# Patient Record
Sex: Female | Born: 1950
Health system: Southern US, Community
[De-identification: ages and names within clinical notes are randomized; demographics above are authoritative.]

## PROBLEM LIST (undated history)

## (undated) DIAGNOSIS — G4730 Sleep apnea, unspecified: Secondary | ICD-10-CM

## (undated) DIAGNOSIS — I10 Essential (primary) hypertension: Secondary | ICD-10-CM

## (undated) DIAGNOSIS — F329 Major depressive disorder, single episode, unspecified: Secondary | ICD-10-CM

## (undated) DIAGNOSIS — Z8719 Personal history of other diseases of the digestive system: Secondary | ICD-10-CM

## (undated) DIAGNOSIS — F32A Depression, unspecified: Secondary | ICD-10-CM

## (undated) DIAGNOSIS — J302 Other seasonal allergic rhinitis: Secondary | ICD-10-CM

## (undated) DIAGNOSIS — K219 Gastro-esophageal reflux disease without esophagitis: Secondary | ICD-10-CM

## (undated) DIAGNOSIS — M722 Plantar fascial fibromatosis: Secondary | ICD-10-CM

## (undated) DIAGNOSIS — L02416 Cutaneous abscess of left lower limb: Secondary | ICD-10-CM

## (undated) DIAGNOSIS — I501 Left ventricular failure: Secondary | ICD-10-CM

## (undated) DIAGNOSIS — M1990 Unspecified osteoarthritis, unspecified site: Secondary | ICD-10-CM

## (undated) DIAGNOSIS — F319 Bipolar disorder, unspecified: Secondary | ICD-10-CM

## (undated) DIAGNOSIS — I509 Heart failure, unspecified: Secondary | ICD-10-CM

## (undated) DIAGNOSIS — M329 Systemic lupus erythematosus, unspecified: Secondary | ICD-10-CM

## (undated) DIAGNOSIS — E669 Obesity, unspecified: Secondary | ICD-10-CM

## (undated) DIAGNOSIS — E039 Hypothyroidism, unspecified: Secondary | ICD-10-CM

## (undated) DIAGNOSIS — E785 Hyperlipidemia, unspecified: Secondary | ICD-10-CM

## (undated) DIAGNOSIS — R0600 Dyspnea, unspecified: Secondary | ICD-10-CM

## (undated) DIAGNOSIS — M48061 Spinal stenosis, lumbar region without neurogenic claudication: Secondary | ICD-10-CM

## (undated) DIAGNOSIS — R519 Headache, unspecified: Secondary | ICD-10-CM

## (undated) DIAGNOSIS — R51 Headache: Secondary | ICD-10-CM

## (undated) DIAGNOSIS — E079 Disorder of thyroid, unspecified: Secondary | ICD-10-CM

## (undated) HISTORY — PX: TONSILLECTOMY: SUR1361

## (undated) HISTORY — DX: Sleep apnea, unspecified: G47.30

## (undated) HISTORY — PX: WISDOM TOOTH EXTRACTION: SHX21

## (undated) HISTORY — DX: Heart failure, unspecified: I50.9

## (undated) HISTORY — DX: Left ventricular failure, unspecified: I50.1

## (undated) HISTORY — PX: COLONOSCOPY: SHX174

## (undated) HISTORY — PX: LEG SURGERY: SHX1003

## (undated) HISTORY — PX: OTHER SURGICAL HISTORY: SHX169

## (undated) HISTORY — PX: FOOT SURGERY: SHX648

## (undated) HISTORY — PX: EYE SURGERY: SHX253

## (undated) HISTORY — PX: FRACTURE SURGERY: SHX138

## (undated) HISTORY — DX: Obesity, unspecified: E66.9

## (undated) HISTORY — PX: DILATION AND CURETTAGE OF UTERUS: SHX78

---
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
## 2004-04-06 ENCOUNTER — Other Ambulatory Visit: Admission: RE | Admit: 2004-04-06 | Discharge: 2004-04-06 | Payer: Self-pay | Admitting: Obstetrics and Gynecology

## 2004-05-07 ENCOUNTER — Encounter: Admission: RE | Admit: 2004-05-07 | Discharge: 2004-05-07 | Payer: Self-pay | Admitting: Obstetrics and Gynecology

## 2005-04-07 ENCOUNTER — Other Ambulatory Visit: Admission: RE | Admit: 2005-04-07 | Discharge: 2005-04-07 | Payer: Self-pay | Admitting: Obstetrics and Gynecology

## 2005-05-03 HISTORY — PX: CATARACT EXTRACTION: SUR2

## 2005-05-10 ENCOUNTER — Encounter: Admission: RE | Admit: 2005-05-10 | Discharge: 2005-05-10 | Payer: Self-pay | Admitting: Obstetrics and Gynecology

## 2005-09-04 ENCOUNTER — Inpatient Hospital Stay (HOSPITAL_COMMUNITY): Admission: EM | Admit: 2005-09-04 | Discharge: 2005-09-13 | Payer: Self-pay | Admitting: *Deleted

## 2005-09-13 ENCOUNTER — Inpatient Hospital Stay (HOSPITAL_COMMUNITY): Admission: RE | Admit: 2005-09-13 | Discharge: 2005-09-18 | Payer: Self-pay | Admitting: Psychiatry

## 2005-09-14 ENCOUNTER — Ambulatory Visit: Payer: Self-pay | Admitting: Psychiatry

## 2005-09-22 ENCOUNTER — Ambulatory Visit: Payer: Self-pay | Admitting: Psychiatry

## 2005-09-22 ENCOUNTER — Other Ambulatory Visit (HOSPITAL_COMMUNITY): Admission: RE | Admit: 2005-09-22 | Discharge: 2005-10-22 | Payer: Self-pay | Admitting: Psychiatry

## 2006-05-06 ENCOUNTER — Other Ambulatory Visit: Admission: RE | Admit: 2006-05-06 | Discharge: 2006-05-06 | Payer: Self-pay | Admitting: Obstetrics and Gynecology

## 2006-05-24 ENCOUNTER — Encounter: Admission: RE | Admit: 2006-05-24 | Discharge: 2006-05-24 | Payer: Self-pay | Admitting: Obstetrics and Gynecology

## 2006-11-29 ENCOUNTER — Encounter (INDEPENDENT_AMBULATORY_CARE_PROVIDER_SITE_OTHER): Payer: Self-pay | Admitting: Obstetrics and Gynecology

## 2006-11-29 ENCOUNTER — Ambulatory Visit (HOSPITAL_COMMUNITY): Admission: RE | Admit: 2006-11-29 | Discharge: 2006-11-29 | Payer: Self-pay | Admitting: Obstetrics and Gynecology

## 2007-07-10 ENCOUNTER — Other Ambulatory Visit: Admission: RE | Admit: 2007-07-10 | Discharge: 2007-07-10 | Payer: Self-pay | Admitting: Obstetrics and Gynecology

## 2007-07-10 ENCOUNTER — Encounter: Admission: RE | Admit: 2007-07-10 | Discharge: 2007-07-10 | Payer: Self-pay | Admitting: Obstetrics and Gynecology

## 2008-08-19 ENCOUNTER — Encounter: Admission: RE | Admit: 2008-08-19 | Discharge: 2008-08-19 | Payer: Self-pay | Admitting: Obstetrics and Gynecology

## 2010-05-23 ENCOUNTER — Encounter: Payer: Self-pay | Admitting: Obstetrics and Gynecology

## 2010-05-24 ENCOUNTER — Encounter: Payer: Self-pay | Admitting: Gastroenterology

## 2010-09-01 ENCOUNTER — Other Ambulatory Visit: Payer: Self-pay | Admitting: Family Medicine

## 2010-09-01 DIAGNOSIS — Z1231 Encounter for screening mammogram for malignant neoplasm of breast: Secondary | ICD-10-CM

## 2010-09-15 NOTE — H&P (Signed)
Jessica [name], Jessica [name]              ACCOUNT NO.:  [id number]   MEDICAL RECORD NO.:  [id number]           PATIENT TYPE:   LOCATION:                                 FACILITY:   PHYSICIAN:  Charles A. Delcambre, MDDATE OF BIRTH:  [birth year]   DATE OF ADMISSION:  DATE OF DISCHARGE:                              HISTORY & PHYSICAL   HISTORY:  The patient is a 60 year old gravida 2, para 2, 0, 0, 2,  menopausal now for about 4-1/2 years on hormone replacement therapy with  Prempro 0.625 mg.  She had some bleeding and we switched her to Premarin  for seven days and then back to the Prempro and this did not help with  her bleeding.  She had a cervical polyp removed about two months ago and  this showed an inflamed endocervical polyp.  An endometrial biopsy was  done at that time and was negative.  She continues bleeding, however.  An ultrasound was done, showing endometrial thickness of 0.4 cm and  irregular, felt to be thickened with possible polyp.  Therefore she is  to be admitted now for a hysteroscopy and D&C.  She gives an informed  consent and accepts the risks of infection, bleeding, uterine  perforation and bowel and bladder damage.  All questions are answered.  She gives an informed consent.   PAST MEDICAL HISTORY:  1. Bipolar disorder.  2. Hypothyroidism.  3. Arthritis.  4. Insomnia.  5. Depression.  6. Menopause.   PAST SURGICAL HISTORY:  1. Tonsillectomy.  2. Ganglion cyst removal.  3. Carpal tunnel surgery.  4. Leg fracture.   MEDICATIONS:  1. Lithium 600 mg b.i.d.  2. A sleeping pill, dose not specified.  3. A blood pressure pill, dose not specified.  4. Detrol 4 mg once daily  5. Synthroid, dose not specified.  She is instructed to bring these to      the hospital.   ALLERGIES:  Sensitive to [facility], which cause diarrhea, but not allergic  symptoms.   SOCIAL HISTORY:  One glass of wine rarely.  No tobacco, ethanol or drug  use.  The patient is married in a  monogamous relationship with her  husband.   FAMILY HISTORY:  Father deceased at age 47, of natural causes.  Mother  deceased at age 62, died of breast cancer.  No siblings.  Otherwise no  family history of uterus, ovary, cervix or colon cancer.  No lymphoma,  coronary artery disease, stroke, diabetes or hypertension.   REVIEW OF SYSTEMS:  Denies fever, chills, rashes, lesions, headaches,  dizziness, seasonable allergies, chest pain, shortness of breath,  wheezing, diarrhea, constipation, bleeding, urgency, frequency, dysuria,  incontinence, galactorrhea or emotional changes.   PHYSICAL EXAMINATION:  GENERAL:  Alert and oriented x3, in no distress.  VITAL SIGNS:  Weight 206 pounds, heart rate 74, respirations 18, blood  pressure 134/76.  HEENT:  Grossly within normal limits.  NECK:  Supple without thyromegaly or adenopathy.  LUNGS:  Clear bilaterally.  HEART:  A regular rate and rhythm without murmur.  BREASTS:  No masses, tenderness or discharge, no skin or  nipple change  bilaterally.  ABDOMEN:  Moderately obese, no hepatosplenomegaly palpable.  Exam is  nontender.  PELVIC:  Normal external female genitalia.  Bartholin, urethral and  Skene's within normal limits.  Vault without discharge or lesions.  Multiparous cervix is noted.  Uterus is not enlarged.  Adnexa nontender  without masses bilaterally.  Ovaries nonpalpable bilaterally.  RECTAL:  Examination was not done.  Anus and perineum appear normal.   ASSESSMENT:  Post-menopausal bleeding.   PLAN:  Hysteroscopy and D&C.  An informed consent given as noted above.  We will get a CBC and CMET prior to surgery.  An electrocardiogram and  chest x-ray per anesthesia.  She will remain n.p.o. past midnight.  All  questions were answered.  Will proceed as outlined.      Charles A. Sydnee Cabal, MD  Electronically Signed     CAD/MEDQ  D:  11/22/2006  T:  11/22/2006  Job:  [id number]

## 2010-09-15 NOTE — Op Note (Signed)
Jessica [name], LAPAGE             ACCOUNT NO.:  [id number]   MEDICAL RECORD NO.:  [id number]          PATIENT TYPE:  AMB   LOCATION:  SDC                           FACILITY:  [facility]   PHYSICIAN:  Charles A. Delcambre, MDDATE OF BIRTH:  Jun 11, 1950   DATE OF PROCEDURE:  11/29/2006  DATE OF DISCHARGE:                               OPERATIVE REPORT   PREOPERATIVE DIAGNOSIS:  1. Postmenopausal bleeding.  2. Endometrial polyp.   POSTOPERATIVE DIAGNOSIS:  1. Postmenopausal bleeding.  2. Endometrial polyp.   PROCEDURE:  1. Hysteroscopy.  2. Dilation curettage.  3. Polypectomy.  4. Paracervical block.   SURGEON:  Charles A. Sydnee Cabal, M.D.   ASSISTANT:  None.   COMPLICATIONS:  None.   SORBITOL LOSS:  25 mL.   ANESTHESIA:  Monitored anesthesia with IV sedation.   FINDINGS:  Posterior small endometrial polyp, otherwise atrophic  appearing endometrium.   SPECIMEN:  1. Polyps segment to pathology.  2. Endometrial curettings with polyp base likely to pathology.   COMPLICATIONS:  None.   ESTIMATED BLOOD LOSS:  Less than 25 mL.  Sponge and needle count correct  x2.   DESCRIPTION OF PROCEDURE:  The patient was taken to the operating room,  placed in supine position. IV sedation was accomplished  and was then  placed in dorsal lithotomy position in universal stirrups.  A sterile  prep and drape was undertaken.  A bivalve speculum was placed.  Tenaculums placed on the anterior cervix.  Weighted speculum was then  placed the vagina with cervix visualized adequately once it was pulled  down. A paracervical block of 20 mL quarter percent plain Marcaine was  injected at 4 and 8 o'clock without intravascular location of injection  noted.  Hanks dilators were used to dilate enough to get the small  hysteroscope in and hysteroscopic findings were noted above.  Hysteroscope was removed.  Polyp forceps were used to remove the polyp.  The small banjo curette was then used to  circumferentially curette the  entire cavity.  There was no evidence of perforation. Small amount of  tissue was obtained with curettings, tenaculum was removed.  There was  small amount of bleeding from the left tenaculum site and pressure was  held, silver nitrate was used, and was still oozing but I felt this was  within normal limits and then she was wakened taken to recovery with  physician in attendance.      Charles A. Sydnee Cabal, MD  Electronically Signed     CAD/MEDQ  D:  11/29/2006  T:  11/30/2006  Job:  [id number]

## 2010-09-16 ENCOUNTER — Ambulatory Visit
Admission: RE | Admit: 2010-09-16 | Discharge: 2010-09-16 | Disposition: A | Discharge status: Home / Self Care | Payer: Medicare Other | Source: Ambulatory Visit | Attending: Family Medicine | Admitting: Family Medicine

## 2010-09-16 DIAGNOSIS — Z1231 Encounter for screening mammogram for malignant neoplasm of breast: Secondary | ICD-10-CM

## 2010-09-18 NOTE — Discharge Summary (Signed)
NAMELEONIE, AMACHER             ACCOUNT NO.:  [id number]   MEDICAL RECORD NO.:  [id number]          PATIENT TYPE:  INP   LOCATION:  3713                         FACILITY:  [facility]   PHYSICIAN:  Kela Millin, M.D.DATE OF BIRTH:  [birth year]   DATE OF ADMISSION:  09/04/2005  DATE OF DISCHARGE:                                 DISCHARGE SUMMARY   ADDENDUM TO DISCHARGE SUMMARY:   HOSPITAL COURSE:  The patient was initially seen by the psychiatrist, Dr.  Jeanie Sewer, on Sep 06, 2005, and recommended [facility].  Following the approval for transfer to  [facility], the patient became very agitated with markedly elevated  blood pressures.  There was concern that she might have developed a medical  illness that was the cause for delirium, so she was kept in acute care for  reevaluation.  It had been noted initially that her TSH was suppressed on  her admission dose of Synthroid, and this was decreased to 150 mcg daily.  TSH was rechecked, and it was noted to still be suppressed at 0.004.  The  Synthroid was then held at the time given that the patient was mildly  tachycardic at 108.  I discussed the patient with the endocrinologist, and  he agree with holding the Synthroid for one week, and then restarting it at  150 mcg daily.  The patient was to follow up with her primary care  physician, Dr. Manus Gunning upon subsequent discharge from [facility] for  further adjustment of her Synthroid dose as appropriate based on recheck  thyroid function test.  The patient had not been taking her oral  antihypertensives, and so she was placed on IV Labetalol for better blood  pressure control.  The patient had been on Seroquel, and this was  discontinued per Psychiatry and started on Zyprexa instead.  The patient's  agitation improved, and she was able to tolerate her p.o. medications.  Her  blood pressures have been well controlled, and I  reconsulted Psychiatry to  reevaluate the patient, and this was done, and again, it was recommended  that the patient be transferred to [facility] on inpatient treatment.   DISCHARGE MEDICATIONS:  As per Dr. Landry Dyke discharge summary of Sep 06, 2005, except for:  1.  The Seroquel which was discontinued and Zyprexa 5 mg p.o. b.i.d.  2.  Clonidine was added at 0.1 mg p.o. b.i.d.  3.  Synthroid is being held until May 17 at which time it would be started      at 150 mcg p.o. daily beginning Sep 16, 2005.   CONDITION ON DISCHARGE:  Improved, stable.      Kela Millin, M.D.  Electronically Signed     ACV/MEDQ  D:  09/13/2005  T:  09/13/2005  Job:  [id number]   cc:   Bryan Lemma. Manus Gunning, M.D.  Fax: [phone]   Jetty Duhamel., M.D.  Fax: [phone]

## 2010-09-18 NOTE — H&P (Signed)
[name], [name]             ACCOUNT NO.:  [id number]   MEDICAL RECORD NO.:  [id number]          PATIENT TYPE:  INP   LOCATION:  1823                         FACILITY:  [facility]   PHYSICIAN:  Sherin Quarry, MD      DATE OF BIRTH:  [birth year]   DATE OF ADMISSION:  09/04/2005  DATE OF DISCHARGE:                                HISTORY & PHYSICAL   Jessica [name] is a 60 year old lady who has bipolar syndrome.  All history  is obtained from the patient's husband because she is unable to answer  questions.  According to the patient's husband, she was in her usual state  until January, when she became increasingly depressed.  She was evaluated  since that time on a frequent basis by Dr. Jennelle Human, who has made adjustments  in her medications.  A few weeks ago, he added lithium to her regimen.  Apparently, she is currently taking lithium 600 mg twice daily.  Yesterday,  her husband says, that she seemed more subdued than usual and was somewhat  unsteady on her feet.  After eating supper, she went and worked at the  computer until about 10 o'clock at night.  When her husband checked on her  at that time, she reported to him that she was experiencing visual  hallucinations.  Later on during the night, apparently she got up and  tripped and fell and it appeared that prior to falling she had been walking  around the house in a confused state.  When her husband found her lying on  the floor, she told him that she felt as though she was dying.  When she  awakened this morning, she was confused, incoherent, and then stopped  talking altogether.  For this reason, she was transported to the [facility] arriving at 10.  On arrival she was noted to have a temperature  100, blood pressure was 178/104, pulse was 111, and she was quite tremulous.  She was not speaking.   Since that time, laboratory studies obtained included a sodium 132,  potassium 4.2, BUN of 8, glucose of 117, creatinine of  0.6.  Urinalysis was  negative.  Lithium level was 0.47.  Arterial blood gas showed a pH of 7.4,  pO2 of 159, pCO2 of 35.  Urine drug screen was negative for opiates and  benzodiazepines.  Liver functions were normal.  The patient was sent for a  chest x-ray which showed mild cardiomegaly and no acute cardiopulmonary  disease.  The CT scan of the brain was negative.   The patient will be admitted at this time for further evaluation of altered  mental status with associated tremor and tachycardia.   PAST MEDICAL HISTORY:   CURRENT MEDICATIONS:  Consist of:  1.  Prempro.  2.  Effexor XR at an unknown dose.  3.  Lithium 600 mg b.i.d.  4.  Sular 10 mg daily.  5.  Synthroid 224 mcg daily.  6.  Trileptal 600 mg twice daily.  7.  Ativan 1 mg twice daily.  8.  Seroquel 600 mg at bedtime daily.  9.  Lamictal 100 mg twice daily and 200 mg at bedtime.   There are no known drug allergies.   OPERATIONS:  1.  The patient's husband says that she has had cataract surgery.  2.  She also had repair of a tibial fracture in 1997.  3.  She has had carpal tunnel operation.   MEDICAL ILLNESSES:  1.  Hypertension.  The patient is on Sular for regulation of her blood      pressure.  Her husband thinks that her blood pressure is generally well      regulated.  2.  Hypothyroidism on Synthroid therapy.  3.  History of chest pain.  The patient was evaluated, in 2003, in      [place]. for an episode of chest pain.  Her husband says that she      had extensive cardiac tests at that time, but he is not sure exactly how      these turned out.  He thinks that everything turned out normally.  4.  Bipolar disorder.  The patient was last hospitalized for psychiatric      problems, in [place]., in January 2006.  Again, the husband is      not sure of the details of this hospitalization   FAMILY HISTORY:  Can not really be obtained very effectively   SOCIAL HISTORY:  The patient reportedly does  not smoke.  She does not  consume alcohol.  She does not abuse drugs.  Her only medications are those  described above.   REVIEW OF SYSTEMS:  Can not be obtained.   PHYSICAL EXAMINATION:  GENERAL:  The patient is tremulous and confused.  She  will open her eyes to command but will not follow commands.  She will not  speak.  She holds her arms and legs in an extended very rigid position.  She  has a diffuse tremor.  HEENT:  Within normal limits with the exception that the neck is quite  rigid.  CHEST:  Clear.  CARDIOVASCULAR:  Reveals normal S1-S2.  There is a tachycardia.  There is no  rubs or gallops.  ABDOMEN:  Benign with normal.  There are normal bowel sounds without masses  or tenderness.  NEUROLOGIC:  Testing.  Cranial nerves II-XII appear to be intact.  Motor  exam, the patient will not follow commands.  She holds both arms and legs  rigid.  Reflexes cannot be assessed.  Babinski's are apparently downgoing.  The main finding on neurologic testing is extreme rigidity.  EXTREMITIES:  Reveals no evidence of cyanosis or edema.   IMPRESSION:  1.  Acute mental confusion with rigidity, tachycardia, hypertension, and      fever.  Consider neuroleptic malignant syndrome or a variant thereof.      Need to rule out infectious process.  2.  Bipolar disorder.  3.  Hypertension.  4.  Hypothyroidism.  5.  History of chest pain.   PLAN:  1.  The patient will be admitted to an intensive care unit.  2.  We will administer intravenous fluids and withhold all psychiatric meds      with the exception of Ativan which will be administered on a regular      basis.  3.  We will use Lopressor to try to better regulate her blood pressure.  4.  Monitor her oxygenation closely.  5.  After obtaining blood and urine cultures, we will place her on broad-      spectrum antibiotic therapy.  [name] is that the patient's status will improve with withholding her psychiatric meds.  If this does not  turn out to be the case, we will obtain  an MRI scan of the brain to rule out underlying neurologic problem.           ______________________________  Sherin Quarry, MD     SY/MEDQ  D:  09/04/2005  T:  09/04/2005  Job:  [id number]   cc:   Bryan Lemma. Manus Gunning, M.D.  Fax: [phone]

## 2010-09-18 NOTE — Discharge Summary (Signed)
Jessica [name], Jessica [name]             ACCOUNT NO.:  [id number]   MEDICAL RECORD NO.:  [id number]          PATIENT TYPE:  IPS   LOCATION:  0508                          FACILITY:  [facility]   PHYSICIAN:  Geoffery Lyons, M.D.      DATE OF BIRTH:  04-21-1951   DATE OF ADMISSION:  09/13/2005  DATE OF DISCHARGE:  09/18/2005                                 DISCHARGE SUMMARY   CHIEF COMPLAINT AND PRESENT ILLNESS:  This was the first admission to  [facility] for this 60 year old Monday, Wednesday, and Friday  voluntarily admitted.  History of depression, feeling depressed since  January, becoming more withdrawn, sleeping too much, losing interest in  normal activities.  Transferred from [facility] after a ten-day  hospitalization for neuroleptic malignant syndrome.  Continued to have some  confusion, feeling that bad things will happen to her and having positive  suicidal thoughts, stating that she felt her husband would be better off  without her.  Also reports seeing a secret message on her cabinet in her  room and is wondering who to trust and who not to trust, stating Are people  here bogus or not?   PAST PSYCHIATRIC HISTORY:  First time here at [facility].  She has  seen Dr. Meredith Staggers.  History of bipolar disorder.  History of suicide  gesture in the past.   ALCOHOL AND DRUG HISTORY:  Denies active use of any substances.   MEDICAL HISTORY:  Hypothyroidism and hypertension.   MEDICATIONS:  1.  Ativan 1 mg up to four times a day.  2.  Lopressor 50 mg twice a day.  3.  Sular 10 mg daily.  4.  Zyprexa 5 twice a day.  5.  Clonidine 0.1 twice a day.  6.  Synthroid 150 mcg daily.   PHYSICAL EXAMINATION:  Performed and failed to show any acute findings.   LABORATORY WORK-UP:  CBC:  White blood cells 15.4 upon admission, decreased  to 11.4.  T4 of 1.57.  Blood sugar 105.  AST 65.  Urine drug screening was  negative.  Lithium level on May 5 was 0.47.  TSH  0.004.   MENTAL STATUS EXAMINATION:  A fully alert, cooperative female.  Good eye  contact.  Casually dressed.  Speech was clear, normal in rate, tempo, and  production.  Articulate.  Feeling anxious, suspicious.  Carries a piece of  paper in her hands, writing names, trying to find __________, asking  multiple questions about routine stuff and asking again questions of whether  or not she was __________  was valid.  Thought process:  Endorsed mild  __________  preoccupation, __________  thoughts, some paranoid ideas.  No  flight of ideas.  Does not appear to be responding to auditory or visual  hallucinations.  Cognition was well preserved.   ADMITTING DIAGNOSES:  AXIS I:  Bipolar disorder.  Rule out delirium.  AXIS II:  No diagnosis.  AXIS III:  Hypertension, hypothyroidism.  AXIS IV:  Moderate.  AXIS V:  Global Assessment of Functioning upon admission 25-30; highest  Global Assessment of  Functioning in the last year 65-70.   COURSE IN HOSPITAL:  She was admitted.  She was started in individual and  group psychotherapy.  She was given Ambien 10 mg at bedtime.  She was kept  on her medications from discharge, Synthroid 150 mcg per day.  As already  stated, diagnosis of bipolar disorder, mostly depressed, isolating,  withdrawing, endorsing no energy, no motivation.  Had let go of a lot of her  activities.  Got very upset.  Unsure of what happened.  Endorsed that the  medications were being changed by Dr. Jennelle Human.  It seemed that she got  confused, taken to the ED.  __________ that he found the new medications and  old medications, so no one knew, really, what medications she was really  taking.  She was admitted to __________.  She was experiencing  a lot of  distortions, hallucinations.  Endorsed a near-death experience.  Restrained  for a long time.  She asked to keep medications to a minimum.  On May 15,  she was still endorsing confusion, needing a lot of reassurance, needing to   clarify the expectations.  We continued to work to improve her reality  testing, and as __________  progressed, the psychotic symptoms started  improving.  She continued to get better, more oriented, less confused,  questioned __________  feedback provided by the staff.  At times, she was in  need of redirection.  Continued to be worried and concerned about what  happened, not understanding or recalling all the events.  Confused about the  specifics of the circumstances.  Family session with the husband.  By May  17, there was marked improvement, lack of hallucinations, some improvement  in memory.  She still did not believe she had overdose on her medications.  Husband was supportive.  She continued to improve, more alert, more focused.  Marked improvement in short-term memory.  She was going to celebrate her  34th wedding anniversary on May 19.  The children are going to be there, and  she was feeling up to it, wanting to be there and not disappoint them.  So,  on May 19, she was in full contact with reality, markedly improved, mood  improved, affect brighter __________ .  No suicidal or homicidal ideation.  No hallucinations, no delusions.  Marked improvement in terms of her  cognition.  Willing and motivated to pursue further outpatient treatment.   DISCHARGE DIAGNOSES:  AXIS I:  Bipolar disorder, depressed.  Organic brain  syndrome and delirium resolved.  AXIS II:  No diagnosis.  AXIS III:  Hypertension, hypothyroidism, neuroleptic malignant syndrome--  resolved.  AXIS IV:  Moderate.  AXIS V:  Global Assessment of Functioning upon discharge 55-60.   DISCHARGE MEDICATIONS:  1.  Ativan 1 mg up to four times a day as needed.  2.  Lopressor 50 mg twice a day.  3.  Sular 10 mg daily in the morning.  4.  Zyprexa 5 twice a day.  5.  Catapres 0.1 mg twice a day.  6.  Restasis 0.5% ophthalmic solution, one drop every 12 hours. 7.  Drysol, apply daily.  8.  Premarin 0.625 mg per day.   9.  Provera 2.5 mg per day.  10. Synthroid 150 mcg daily.  11. Ambien 10 at night for sleep.   FOLLOW-UP:  Follow up with Dr. Jennelle Human and Ollen Gross.  To come to [facility] intensive outpatient program for further  stabilization.  Geoffery Lyons, M.D.  Electronically Signed     IL/MEDQ  D:  10/18/2005  T:  10/19/2005  Job:  [id number]

## 2010-09-18 NOTE — Discharge Summary (Signed)
NAMENIANNA, IGO             ACCOUNT NO.:  [id number]   MEDICAL RECORD NO.:  [id number]          PATIENT TYPE:  INP   LOCATION:  3713                         FACILITY:  [facility]   PHYSICIAN:  Sherin Quarry, MD      DATE OF BIRTH:  05-Aug-1950   DATE OF ADMISSION:  09/04/2005  DATE OF DISCHARGE:                                 DISCHARGE SUMMARY   Jessica [name] is a 60 year old lady with bipolar disorder.  Her history  prior to admission is summarized in the history and physical.  Essentially,  she presented with tachycardia, severe tremor, marked rigidity and fever  with altered mental status to the [facility] on May 5.  Onset of  these symptoms occurred over approximately 12 hours.   PHYSICAL EXAMINATION:  GENERAL:  Physical exam at the time of admission  revealed a tremulous, confused patient who would open her eyes to command  but would not follow commands or speak.  Her arms were held in an extremely  rigid, extended position, and arms and legs could not be flexed.  She had a  diffuse distal tremor.  HEENT:  Remarkable for very rigid neck.  The eyes were intermittently  deviated to the left.  CHEST:  Clear.  CARDIOVASCULAR:  Marked tachycardia.  There was normal S1, S2, without rubs,  murmurs or gallops.  ABDOMEN:  Benign.  There were normal bowel sounds with no masses or  tenderness.  NEUROLOGIC:  Cranial nerves II-XII were intact.  The patient did not  spontaneously move her arms or legs, as these were held in a position of  extension and were extremely rigid.  Reflexes were difficult to assess  because of her extreme rigidity.  Babinskis were downgoing.  EXTREMITIES:  No evidence of cyanosis or edema.   On the basis of the patient's history and presentation, my initial  impression was that she might have neuroleptic malignant syndrome.  Relevant  laboratory studies obtained included a CBC, which revealed a white count of  19,700, hemoglobin was 15.  CMET  showed a sodium of 136, potassium 4.4, CO2  of 23, BUN was 7, creatinine was 0.7.  The SGOT was 61.  The remainder of  the liver functions was normal.  Blood alcohol level was reported to be 5.  PT and PTT were normal.  A CT scan of the brain was negative.  A chest x-ray  showed only mild cardiomegaly.  On admission, all of the patient's  psychiatric medications were withheld, which included according to the  patient's husband Effexor, lithium, Trileptal, Seroquel and Lamictal.  She  received an IV of normal saline at 250 mL/hr. four hours, then 125 mL/hr.  She was continued on Ativan and was given 2 mg every six hours for sedation.  Thiamine 100 mg IV was administered.  Blood and urine cultures were obtained  and in light of the patient's fever, she was placed empirically on  vancomycin and Rocephin 1 g IV daily.  Vancomycin was given as per pharmacy  protocol.  Lopressor 5 mg IV every eight hours was begun and a Catapres  TTS-  1 patch was applied.  She was given Lovenox 40 mg subcutaneously daily.  O2  was administered at 2 L and her thyroid medication was continued in the form  of Synthroid 100 mcg IV daily.  By May 6, the patient was remarkably better.  She was alert and oriented x3.  Her rigidity had resolved, her heart rate  was decreased, and she was fairly alert, although she described the events  during the night which suggested that she might have been having visual  hallucinations.  She denied that she was taking any other drugs or  medications besides her psychiatric medications.  At that time she was moved  out of the intensive care unit.  Intravenous fluids were continued.  Lopressor was discontinued and she was placed on metoprolol 50 mg b.i.d.  I  placed her back on Seroquel 200 mg b.i.d. and withheld her other psychiatric  medications.  Her TSH came back somewhat decreased, and therefore I cut her  thyroid medicine to Synthroid 0.15 mg daily.  Psychiatric consult was   obtained with Dr. Katrinka Blazing, who apparently agreed with the diagnosis of  neuroleptic malignant syndrome.  She felt that the patient should not be put  back on the regimen of psychiatric medicines she was on before and that Dr.  Jeanie Sewer should see her with the idea of moving her to the behavioral  health hospital for further psychiatric are.  By May 7 [name] seemed  to be back to baseline.  She was afebrile and her vital signs were stable.  Her blood pressure was 128/92 and her pulse was 86.  At that time oxygen was  discontinued, the Foley catheter was removed, IV fluids were discontinued.  Antibiotics were discontinued as there was no objective evidence of  infection.  Dr. Jeanie Sewer of the psychiatry service was consulted and it was  requested that arrangements be made to transfer to [facility].   DIAGNOSES:  1.  Acute mental confusion apparently secondary to neuroleptic malignant      syndrome.  2.  Bipolar disorder.  3.  Hypertension.  4.  Chronic headaches.  5.  History of chest pain.   Medications at this time consist of:  1.  Ativan 1 mg q.i.d.  2.  Thiamine 100 mg daily.  3.  Lopressor 50 mg b.i.d.  4.  Seroquel 200 mg b.i.d.  5.  Synthroid 150 mcg daily.  6.  Sular 10 mg daily.   Condition at this time is fair.           ______________________________  Sherin Quarry, MD     SY/MEDQ  D:  09/06/2005  T:  09/06/2005  Job:  [id number]   cc:   Jetty Duhamel., M.D.  Fax: [phone]   Bryan Lemma. Manus Gunning, M.D.  Fax: [phone]

## 2010-09-18 NOTE — H&P (Signed)
Jessica [name], WAREN NO.:  [id number]   MEDICAL RECORD NO.:  [id number]          PATIENT TYPE:  IPS   LOCATION:  0508                          FACILITY:  [facility]   PHYSICIAN:  Geoffery Lyons, M.D.      DATE OF BIRTH:  Sep 24, 1950   DATE OF ADMISSION:  09/13/2005  DATE OF DISCHARGE:                         PSYCHIATRIC ADMISSION ASSESSMENT   IDENTIFYING INFORMATION:  This is a 60 year old married white female  voluntarily admitted on Sep 13, 2005.   HISTORY OF PRESENT ILLNESS:  The patient presents with a history of  depression.  Reports feeling depressed since January.  She has been becoming  more withdrawn, sleeping too much, losing interest in normal activities.  The patient is a transfer from [facility] after patient had a 10-day  hospitalization for neuroleptic malignant syndrome.  The patient continues  with having some confusion.  The patient feels that bad things will happen  to her and having passive suicidal thoughts, stating that she feels her  husband would be better off without her.  She also reports seeing a secret  message on her cabinet in her room and is wondering who to trust and who not  to trust, stating are people here bogus or not?   PAST PSYCHIATRIC HISTORY:  First admission to [facility].  Sees Dr. Jennelle Human.  Has a history of bipolar disorder.  The patient reports a  history of a suicidal gesture in the past.  Attempting to put a plastic bag  over her face.   SOCIAL HISTORY:  This is a 60 year old married white female, married for 34  years.  This is her first marriage.  She has two adult children, ages 29 and  15.  Lives with her husband.  She is on disability.  The patient has not  worked over the past couple of years.  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   FAMILY HISTORY:  None.   ALCOHOL/DRUG HISTORY:  Nonsmoker.  Denies any alcohol or drug use.   PRIMARY CARE  PHYSICIAN:  Dr. Manus Gunning in [place].   MEDICAL PROBLEMS:  Hypothyroidism and hypertension.   MEDICATIONS:  The patient was transferred on Ativan 1 mg p.o. q.i.d.,  thiamine 100 mg p.o. daily, Lopressor 50 mg p.o. b.i.d., Sular 10 mg p.o.  daily, Zyprexa 5 mg p.o. b.i.d., clonidine 0.1 mg p.o. b.i.d. and Synthroid  150 mcg which is to be started on May 17th.   ALLERGIES:  ADVIL (patient reports having stomach problems).   PHYSICAL EXAMINATION:  The patient was fully assessed at [facility].  She is a middle-aged female.  Overweight, appears somewhat  unkempt.  Her temperature is 98, heart rate 93, respirations 24, blood  pressure 118/83, height 5 feet 3 inches tall.  She is 209 pounds.   LABORATORY DATA:  Her CBC initially with white count elevated at 15.4,  decreased to 11.4.  Her T4 is 1.57.  Blood sugar is 105, AST 65.  Her urine  drug screen was negative.  The patient's lithium level was Sep 04, 2005 was  0.47.  Her alcohol level was 5.  Urinalysis was negative.  TSH was 0.004.   MENTAL STATUS EXAM:  This is a fully alert, cooperative female.  Good eye  contact.  She is casually dressed.  Her speech is clear, normal rate and  tone, articulate.  The patient feels anxious.  The patient is suspicious.  The patient has a piece of paper in her hand, writing names down, clarifying  titles, asking multiple questions about routine and asking again question  over and over, wondering if what she is hearing is valid.  Thought processes  endorsing some mild religious preoccupation, passive suicidal thoughts and  some paranoid ideation.  There is no flight of ideas.  Does not appear to be  responding to auditory or visual hallucinations.  Cognitive function is  intact.  She is aware of herself and place and situation.  Her short-term  memory is somewhat decreased.  Her judgment is poor.  Insight is fair.  Above average intelligence.   DIAGNOSES:  AXIS I:  Bipolar disorder, mixed.   AXIS II:  Deferred.  AXIS III:  Hypertension and hypothyroidism.  AXIS IV:  Other psychosocial problems related to burden of illness.  AXIS V:  Current 30.   PLAN:  To contract for safety.  Will reorientate patient frequently and  provide reassurance.  Will monitor patient's vital signs and monitor blood  work.  Will resume patient's Synthroid as indicated.  The patient is to  follow up with Dr. Manus Gunning about further monitoring of her thyroid values.  Have a family session with her husband.   TENTATIVE LENGTH OF STAY:  Five to seven days.      Landry Corporal, N.P.      Geoffery Lyons, M.D.  Electronically Signed    JO/MEDQ  D:  09/16/2005  T:  09/17/2005  Job:  [id number]

## 2010-09-22 ENCOUNTER — Other Ambulatory Visit: Payer: Self-pay | Admitting: Obstetrics and Gynecology

## 2010-09-22 ENCOUNTER — Other Ambulatory Visit (HOSPITAL_COMMUNITY)
Admission: RE | Admit: 2010-09-22 | Discharge: 2010-09-22 | Disposition: A | Discharge status: Home / Self Care | Payer: Medicare Other | Source: Ambulatory Visit | Attending: Obstetrics and Gynecology | Admitting: Obstetrics and Gynecology

## 2010-09-22 DIAGNOSIS — Z124 Encounter for screening for malignant neoplasm of cervix: Secondary | ICD-10-CM | POA: Insufficient documentation

## 2011-02-15 LAB — COMPREHENSIVE METABOLIC PANEL
ALT: 13
AST: 21
Albumin: 3.2 — ABNORMAL LOW
Alkaline Phosphatase: 46
BUN: 11
CO2: 27
Calcium: 9.4
Chloride: 106
Creatinine, Ser: 0.69
GFR calc Af Amer: >60
GFR calc non Af Amer: >60
Glucose, Bld: 95
Potassium: 4.1
Sodium: 138
Total Bilirubin: 0.7
Total Protein: 6.4

## 2011-02-15 LAB — CBC
HCT: 38.8
Hemoglobin: 13.1
MCHC: 33.8
MCV: 96.1
Platelets: 463 — ABNORMAL HIGH
RBC: 4.04
RDW: 12.8
WBC: 11.1 — ABNORMAL HIGH

## 2011-05-13 DIAGNOSIS — H04129 Dry eye syndrome of unspecified lacrimal gland: Secondary | ICD-10-CM | POA: Diagnosis not present

## 2011-07-07 DIAGNOSIS — F319 Bipolar disorder, unspecified: Secondary | ICD-10-CM | POA: Diagnosis not present

## 2011-08-03 DIAGNOSIS — Z20828 Contact with and (suspected) exposure to other viral communicable diseases: Secondary | ICD-10-CM | POA: Diagnosis not present

## 2011-08-03 DIAGNOSIS — A088 Other specified intestinal infections: Secondary | ICD-10-CM | POA: Diagnosis not present

## 2011-08-20 DIAGNOSIS — M545 Low back pain, unspecified: Secondary | ICD-10-CM | POA: Diagnosis not present

## 2011-08-20 DIAGNOSIS — M25549 Pain in joints of unspecified hand: Secondary | ICD-10-CM | POA: Diagnosis not present

## 2011-08-20 DIAGNOSIS — M25579 Pain in unspecified ankle and joints of unspecified foot: Secondary | ICD-10-CM | POA: Diagnosis not present

## 2011-08-20 DIAGNOSIS — M25569 Pain in unspecified knee: Secondary | ICD-10-CM | POA: Diagnosis not present

## 2011-09-22 DIAGNOSIS — Z79899 Other long term (current) drug therapy: Secondary | ICD-10-CM | POA: Diagnosis not present

## 2011-09-22 DIAGNOSIS — M159 Polyosteoarthritis, unspecified: Secondary | ICD-10-CM | POA: Diagnosis not present

## 2011-09-22 DIAGNOSIS — R3915 Urgency of urination: Secondary | ICD-10-CM | POA: Diagnosis not present

## 2011-09-22 DIAGNOSIS — E782 Mixed hyperlipidemia: Secondary | ICD-10-CM | POA: Diagnosis not present

## 2011-09-22 DIAGNOSIS — E039 Hypothyroidism, unspecified: Secondary | ICD-10-CM | POA: Diagnosis not present

## 2011-09-22 DIAGNOSIS — F39 Unspecified mood [affective] disorder: Secondary | ICD-10-CM | POA: Diagnosis not present

## 2011-09-22 DIAGNOSIS — I10 Essential (primary) hypertension: Secondary | ICD-10-CM | POA: Diagnosis not present

## 2011-09-28 ENCOUNTER — Other Ambulatory Visit: Payer: Self-pay | Admitting: Family Medicine

## 2011-09-28 DIAGNOSIS — Z1231 Encounter for screening mammogram for malignant neoplasm of breast: Secondary | ICD-10-CM

## 2011-09-29 DIAGNOSIS — F319 Bipolar disorder, unspecified: Secondary | ICD-10-CM | POA: Diagnosis not present

## 2011-10-06 ENCOUNTER — Ambulatory Visit
Admission: RE | Admit: 2011-10-06 | Discharge: 2011-10-06 | Disposition: A | Discharge status: Home / Self Care | Payer: Medicare Other | Source: Ambulatory Visit | Attending: Family Medicine | Admitting: Family Medicine

## 2011-10-06 DIAGNOSIS — Z1231 Encounter for screening mammogram for malignant neoplasm of breast: Secondary | ICD-10-CM | POA: Diagnosis not present

## 2011-10-19 DIAGNOSIS — E039 Hypothyroidism, unspecified: Secondary | ICD-10-CM | POA: Diagnosis not present

## 2011-11-23 DIAGNOSIS — Z79899 Other long term (current) drug therapy: Secondary | ICD-10-CM | POA: Diagnosis not present

## 2012-01-19 DIAGNOSIS — F319 Bipolar disorder, unspecified: Secondary | ICD-10-CM | POA: Diagnosis not present

## 2012-02-01 DIAGNOSIS — L909 Atrophic disorder of skin, unspecified: Secondary | ICD-10-CM | POA: Diagnosis not present

## 2012-02-01 DIAGNOSIS — Z23 Encounter for immunization: Secondary | ICD-10-CM | POA: Diagnosis not present

## 2012-02-01 DIAGNOSIS — J309 Allergic rhinitis, unspecified: Secondary | ICD-10-CM | POA: Diagnosis not present

## 2012-02-01 DIAGNOSIS — R6889 Other general symptoms and signs: Secondary | ICD-10-CM | POA: Diagnosis not present

## 2012-02-01 DIAGNOSIS — B351 Tinea unguium: Secondary | ICD-10-CM | POA: Diagnosis not present

## 2012-03-01 DIAGNOSIS — M545 Low back pain, unspecified: Secondary | ICD-10-CM | POA: Diagnosis not present

## 2012-03-01 DIAGNOSIS — R5383 Other fatigue: Secondary | ICD-10-CM | POA: Diagnosis not present

## 2012-03-01 DIAGNOSIS — R197 Diarrhea, unspecified: Secondary | ICD-10-CM | POA: Diagnosis not present

## 2012-03-01 DIAGNOSIS — R5381 Other malaise: Secondary | ICD-10-CM | POA: Diagnosis not present

## 2012-03-01 DIAGNOSIS — J3489 Other specified disorders of nose and nasal sinuses: Secondary | ICD-10-CM | POA: Diagnosis not present

## 2012-03-01 DIAGNOSIS — M542 Cervicalgia: Secondary | ICD-10-CM | POA: Diagnosis not present

## 2012-03-01 DIAGNOSIS — R110 Nausea: Secondary | ICD-10-CM | POA: Diagnosis not present

## 2012-03-01 DIAGNOSIS — M25549 Pain in joints of unspecified hand: Secondary | ICD-10-CM | POA: Diagnosis not present

## 2012-03-02 DIAGNOSIS — F319 Bipolar disorder, unspecified: Secondary | ICD-10-CM | POA: Diagnosis not present

## 2012-03-07 DIAGNOSIS — R110 Nausea: Secondary | ICD-10-CM | POA: Diagnosis not present

## 2012-03-16 DIAGNOSIS — L989 Disorder of the skin and subcutaneous tissue, unspecified: Secondary | ICD-10-CM | POA: Diagnosis not present

## 2012-03-16 DIAGNOSIS — L74519 Primary focal hyperhidrosis, unspecified: Secondary | ICD-10-CM | POA: Diagnosis not present

## 2012-03-16 DIAGNOSIS — D2360 Other benign neoplasm of skin of unspecified upper limb, including shoulder: Secondary | ICD-10-CM | POA: Diagnosis not present

## 2012-03-16 DIAGNOSIS — L909 Atrophic disorder of skin, unspecified: Secondary | ICD-10-CM | POA: Diagnosis not present

## 2012-04-12 DIAGNOSIS — F319 Bipolar disorder, unspecified: Secondary | ICD-10-CM | POA: Diagnosis not present

## 2012-04-18 DIAGNOSIS — H6690 Otitis media, unspecified, unspecified ear: Secondary | ICD-10-CM | POA: Diagnosis not present

## 2012-04-18 DIAGNOSIS — J40 Bronchitis, not specified as acute or chronic: Secondary | ICD-10-CM | POA: Diagnosis not present

## 2012-05-08 DIAGNOSIS — F39 Unspecified mood [affective] disorder: Secondary | ICD-10-CM | POA: Diagnosis not present

## 2012-05-08 DIAGNOSIS — I10 Essential (primary) hypertension: Secondary | ICD-10-CM | POA: Diagnosis not present

## 2012-05-08 DIAGNOSIS — H6980 Other specified disorders of Eustachian tube, unspecified ear: Secondary | ICD-10-CM | POA: Diagnosis not present

## 2012-05-08 DIAGNOSIS — M159 Polyosteoarthritis, unspecified: Secondary | ICD-10-CM | POA: Diagnosis not present

## 2012-05-08 DIAGNOSIS — E782 Mixed hyperlipidemia: Secondary | ICD-10-CM | POA: Diagnosis not present

## 2012-05-08 DIAGNOSIS — R3915 Urgency of urination: Secondary | ICD-10-CM | POA: Diagnosis not present

## 2012-05-08 DIAGNOSIS — E039 Hypothyroidism, unspecified: Secondary | ICD-10-CM | POA: Diagnosis not present

## 2012-05-08 DIAGNOSIS — Z79899 Other long term (current) drug therapy: Secondary | ICD-10-CM | POA: Diagnosis not present

## 2012-05-15 DIAGNOSIS — H04129 Dry eye syndrome of unspecified lacrimal gland: Secondary | ICD-10-CM | POA: Diagnosis not present

## 2012-06-20 ENCOUNTER — Ambulatory Visit: Payer: Medicare Other | Admitting: *Deleted

## 2012-06-21 ENCOUNTER — Encounter: Payer: Self-pay | Admitting: *Deleted

## 2012-06-27 DIAGNOSIS — J019 Acute sinusitis, unspecified: Secondary | ICD-10-CM | POA: Diagnosis not present

## 2012-07-10 DIAGNOSIS — F319 Bipolar disorder, unspecified: Secondary | ICD-10-CM | POA: Diagnosis not present

## 2012-08-10 DIAGNOSIS — M5137 Other intervertebral disc degeneration, lumbosacral region: Secondary | ICD-10-CM | POA: Diagnosis not present

## 2012-08-10 DIAGNOSIS — M722 Plantar fascial fibromatosis: Secondary | ICD-10-CM | POA: Diagnosis not present

## 2012-08-10 DIAGNOSIS — M19049 Primary osteoarthritis, unspecified hand: Secondary | ICD-10-CM | POA: Diagnosis not present

## 2012-08-10 DIAGNOSIS — M545 Low back pain, unspecified: Secondary | ICD-10-CM | POA: Diagnosis not present

## 2012-09-14 DIAGNOSIS — F319 Bipolar disorder, unspecified: Secondary | ICD-10-CM | POA: Diagnosis not present

## 2012-09-28 DIAGNOSIS — Z79899 Other long term (current) drug therapy: Secondary | ICD-10-CM | POA: Diagnosis not present

## 2012-09-28 DIAGNOSIS — F21 Schizotypal disorder: Secondary | ICD-10-CM | POA: Diagnosis not present

## 2012-10-17 DIAGNOSIS — E039 Hypothyroidism, unspecified: Secondary | ICD-10-CM | POA: Diagnosis not present

## 2012-11-09 DIAGNOSIS — E039 Hypothyroidism, unspecified: Secondary | ICD-10-CM | POA: Diagnosis not present

## 2012-11-09 DIAGNOSIS — I10 Essential (primary) hypertension: Secondary | ICD-10-CM | POA: Diagnosis not present

## 2012-11-09 DIAGNOSIS — R05 Cough: Secondary | ICD-10-CM | POA: Diagnosis not present

## 2012-11-09 DIAGNOSIS — M159 Polyosteoarthritis, unspecified: Secondary | ICD-10-CM | POA: Diagnosis not present

## 2012-11-09 DIAGNOSIS — Z79899 Other long term (current) drug therapy: Secondary | ICD-10-CM | POA: Diagnosis not present

## 2012-11-09 DIAGNOSIS — F39 Unspecified mood [affective] disorder: Secondary | ICD-10-CM | POA: Diagnosis not present

## 2012-11-09 DIAGNOSIS — E782 Mixed hyperlipidemia: Secondary | ICD-10-CM | POA: Diagnosis not present

## 2012-11-09 DIAGNOSIS — R059 Cough, unspecified: Secondary | ICD-10-CM | POA: Diagnosis not present

## 2012-11-09 DIAGNOSIS — Z1239 Encounter for other screening for malignant neoplasm of breast: Secondary | ICD-10-CM | POA: Diagnosis not present

## 2012-11-09 DIAGNOSIS — R3915 Urgency of urination: Secondary | ICD-10-CM | POA: Diagnosis not present

## 2012-11-11 DIAGNOSIS — H60399 Other infective otitis externa, unspecified ear: Secondary | ICD-10-CM | POA: Diagnosis not present

## 2012-11-17 DIAGNOSIS — E039 Hypothyroidism, unspecified: Secondary | ICD-10-CM | POA: Diagnosis not present

## 2012-11-17 DIAGNOSIS — E669 Obesity, unspecified: Secondary | ICD-10-CM | POA: Diagnosis not present

## 2012-11-17 DIAGNOSIS — H60399 Other infective otitis externa, unspecified ear: Secondary | ICD-10-CM | POA: Diagnosis not present

## 2012-11-20 ENCOUNTER — Other Ambulatory Visit: Payer: Self-pay

## 2012-11-20 DIAGNOSIS — Z1231 Encounter for screening mammogram for malignant neoplasm of breast: Secondary | ICD-10-CM

## 2012-11-27 DIAGNOSIS — H9209 Otalgia, unspecified ear: Secondary | ICD-10-CM | POA: Diagnosis not present

## 2012-11-28 DIAGNOSIS — H6120 Impacted cerumen, unspecified ear: Secondary | ICD-10-CM | POA: Diagnosis not present

## 2012-11-28 DIAGNOSIS — H9209 Otalgia, unspecified ear: Secondary | ICD-10-CM | POA: Diagnosis not present

## 2012-11-28 DIAGNOSIS — H6690 Otitis media, unspecified, unspecified ear: Secondary | ICD-10-CM | POA: Diagnosis not present

## 2012-11-28 DIAGNOSIS — H905 Unspecified sensorineural hearing loss: Secondary | ICD-10-CM | POA: Diagnosis not present

## 2012-12-14 ENCOUNTER — Ambulatory Visit
Admission: RE | Admit: 2012-12-14 | Discharge: 2012-12-14 | Disposition: A | Discharge status: Home / Self Care | Payer: Medicare Other | Source: Ambulatory Visit

## 2012-12-14 DIAGNOSIS — Z1231 Encounter for screening mammogram for malignant neoplasm of breast: Secondary | ICD-10-CM

## 2012-12-20 DIAGNOSIS — H905 Unspecified sensorineural hearing loss: Secondary | ICD-10-CM | POA: Diagnosis not present

## 2012-12-20 DIAGNOSIS — B3784 Candidal otitis externa: Secondary | ICD-10-CM | POA: Diagnosis not present

## 2013-01-11 DIAGNOSIS — Z79899 Other long term (current) drug therapy: Secondary | ICD-10-CM | POA: Diagnosis not present

## 2013-01-22 ENCOUNTER — Encounter (HOSPITAL_COMMUNITY): Payer: Self-pay

## 2013-01-22 DIAGNOSIS — N179 Acute kidney failure, unspecified: Secondary | ICD-10-CM | POA: Insufficient documentation

## 2013-01-22 DIAGNOSIS — E785 Hyperlipidemia, unspecified: Secondary | ICD-10-CM | POA: Diagnosis not present

## 2013-01-22 DIAGNOSIS — I10 Essential (primary) hypertension: Secondary | ICD-10-CM | POA: Insufficient documentation

## 2013-01-22 DIAGNOSIS — Z79899 Other long term (current) drug therapy: Secondary | ICD-10-CM | POA: Insufficient documentation

## 2013-01-22 DIAGNOSIS — D72829 Elevated white blood cell count, unspecified: Secondary | ICD-10-CM | POA: Insufficient documentation

## 2013-01-22 DIAGNOSIS — R079 Chest pain, unspecified: Principal | ICD-10-CM | POA: Insufficient documentation

## 2013-01-22 DIAGNOSIS — R072 Precordial pain: Secondary | ICD-10-CM | POA: Diagnosis not present

## 2013-01-22 LAB — BASIC METABOLIC PANEL
BUN: 18 mg/dL (ref 6–23)
CO2: 23 mEq/L (ref 19–32)
Calcium: 9.4 mg/dL (ref 8.4–10.5)
Chloride: 102 mEq/L (ref 96–112)
Creatinine, Ser: 1.12 mg/dL — ABNORMAL HIGH (ref 0.50–1.10)
GFR calc Af Amer: 59 mL/min — ABNORMAL LOW (ref >90)
GFR calc non Af Amer: 51 mL/min — ABNORMAL LOW (ref >90)
Glucose, Bld: 100 mg/dL — ABNORMAL HIGH (ref 70–99)
Potassium: 4.3 mEq/L (ref 3.5–5.1)
Sodium: 134 mEq/L — ABNORMAL LOW (ref 135–145)

## 2013-01-22 LAB — CBC
HCT: 39.2 % (ref 36.0–46.0)
Hemoglobin: 13 g/dL (ref 12.0–15.0)
MCH: 31.6 pg (ref 26.0–34.0)
MCHC: 33.2 g/dL (ref 30.0–36.0)
MCV: 95.4 fL (ref 78.0–100.0)
Platelets: 366 10*3/uL (ref 150–400)
RBC: 4.11 MIL/uL (ref 3.87–5.11)
RDW: 13.2 % (ref 11.5–15.5)
WBC: 11.1 10*3/uL — ABNORMAL HIGH (ref 4.0–10.5)

## 2013-01-22 LAB — POCT I-STAT TROPONIN I: Troponin i, poc: 0 ng/mL (ref 0.00–0.08)

## 2013-01-22 NOTE — ED Notes (Signed)
Patient presents from home with a chief complaint of chest pain to the center of her chest. Patient describes pain as "discomfort" and "burning sensation" that lasted for about 30 min. Patient reports history of acid reflux. Patient also reporting nausea, denies vomiting.  Patient rating "discomfort" as 1/10 at this time.

## 2013-01-23 ENCOUNTER — Emergency Department (HOSPITAL_COMMUNITY): Payer: Medicare Other

## 2013-01-23 ENCOUNTER — Observation Stay (HOSPITAL_COMMUNITY)
Admission: EM | Admit: 2013-01-23 | Discharge: 2013-01-23 | Disposition: A | Discharge status: Home / Self Care | Payer: Medicare Other | Attending: Internal Medicine | Admitting: Internal Medicine

## 2013-01-23 DIAGNOSIS — D72829 Elevated white blood cell count, unspecified: Secondary | ICD-10-CM | POA: Diagnosis present

## 2013-01-23 DIAGNOSIS — I10 Essential (primary) hypertension: Secondary | ICD-10-CM | POA: Diagnosis present

## 2013-01-23 DIAGNOSIS — E785 Hyperlipidemia, unspecified: Secondary | ICD-10-CM | POA: Diagnosis not present

## 2013-01-23 DIAGNOSIS — N179 Acute kidney failure, unspecified: Secondary | ICD-10-CM

## 2013-01-23 DIAGNOSIS — R079 Chest pain, unspecified: Secondary | ICD-10-CM | POA: Diagnosis not present

## 2013-01-23 HISTORY — DX: Bipolar disorder, unspecified: F31.9

## 2013-01-23 HISTORY — DX: Disorder of thyroid, unspecified: E07.9

## 2013-01-23 HISTORY — DX: Essential (primary) hypertension: I10

## 2013-01-23 LAB — BASIC METABOLIC PANEL
BUN: 15 mg/dL (ref 6–23)
CO2: 23 mEq/L (ref 19–32)
Calcium: 8.9 mg/dL (ref 8.4–10.5)
Chloride: 110 mEq/L (ref 96–112)
Creatinine, Ser: 0.67 mg/dL (ref 0.50–1.10)
GFR calc Af Amer: >90 mL/min (ref >90)
GFR calc non Af Amer: >90 mL/min (ref >90)
Glucose, Bld: 99 mg/dL (ref 70–99)
Potassium: 4.1 mEq/L (ref 3.5–5.1)
Sodium: 141 mEq/L (ref 135–145)

## 2013-01-23 LAB — LIPID PANEL
Cholesterol: 173 mg/dL (ref 0–200)
HDL: 51 mg/dL (ref >39)
LDL Cholesterol: 68 mg/dL (ref 0–99)
Total CHOL/HDL Ratio: 3.4 RATIO
Triglycerides: 271 mg/dL — ABNORMAL HIGH (ref <150)
VLDL: 54 mg/dL — ABNORMAL HIGH (ref 0–40)

## 2013-01-23 LAB — TROPONIN I
Troponin I: <0.3 ng/mL (ref <0.30)
Troponin I: <0.3 ng/mL (ref <0.30)

## 2013-01-23 LAB — HEMOGLOBIN A1C
Hgb A1c MFr Bld: 5.9 % — ABNORMAL HIGH (ref <5.7)
Mean Plasma Glucose: 123 mg/dL — ABNORMAL HIGH (ref <117)

## 2013-01-23 LAB — CBC
HCT: 37.9 % (ref 36.0–46.0)
Hemoglobin: 12.3 g/dL (ref 12.0–15.0)
MCH: 31.6 pg (ref 26.0–34.0)
MCHC: 32.5 g/dL (ref 30.0–36.0)
MCV: 97.4 fL (ref 78.0–100.0)
Platelets: 363 10*3/uL (ref 150–400)
RBC: 3.89 MIL/uL (ref 3.87–5.11)
RDW: 13.7 % (ref 11.5–15.5)
WBC: 10.8 10*3/uL — ABNORMAL HIGH (ref 4.0–10.5)

## 2013-01-23 LAB — TSH: TSH: 1.014 u[IU]/mL (ref 0.350–4.500)

## 2013-01-23 MED ORDER — PREDNISONE 5 MG PO TABS: 5.0000 mg | ORAL_TABLET | Freq: Every day | ORAL | Status: DC

## 2013-01-23 MED ORDER — ACETAMINOPHEN ER 650 MG PO TBCR: 1300.0000 mg | EXTENDED_RELEASE_TABLET | ORAL | Status: DC | PRN

## 2013-01-23 MED ORDER — ALBUTEROL SULFATE HFA 108 (90 BASE) MCG/ACT IN AERS: 2.0000 | INHALATION_SPRAY | RESPIRATORY_TRACT | Status: DC | PRN

## 2013-01-23 MED ORDER — FLUTICASONE PROPIONATE 50 MCG/ACT NA SUSP
1.0000 | Freq: Every day | NASAL | Status: DC
  Administered 2013-01-23: 1 via NASAL
  Filled 2013-01-23: qty 16

## 2013-01-23 MED ORDER — LITHIUM CARBONATE 300 MG PO CAPS
300.0000 mg | ORAL_CAPSULE | Freq: Every day | ORAL | Status: DC
  Administered 2013-01-23: 300 mg via ORAL
  Filled 2013-01-23: qty 1

## 2013-01-23 MED ORDER — ASPIRIN 81 MG PO CHEW
324.0000 mg | CHEWABLE_TABLET | Freq: Once | ORAL | Status: AC
  Administered 2013-01-23: 324 mg via ORAL
  Filled 2013-01-23: qty 4

## 2013-01-23 MED ORDER — AMBRISENTAN 5 MG PO TABS: 5.0000 mg | ORAL_TABLET | Freq: Every evening | ORAL | Status: DC | PRN

## 2013-01-23 MED ORDER — ADULT MULTIVITAMIN W/MINERALS CH
1.0000 | ORAL_TABLET | Freq: Every day | ORAL | Status: DC
  Administered 2013-01-23: 1 via ORAL
  Filled 2013-01-23: qty 1

## 2013-01-23 MED ORDER — ACETAMINOPHEN 325 MG PO TABS: 650.0000 mg | ORAL_TABLET | ORAL | Status: DC | PRN

## 2013-01-23 MED ORDER — METHYLPHENIDATE HCL ER (LA) 10 MG PO CP24
40.0000 mg | ORAL_CAPSULE | ORAL | Status: DC
Start: 2013-01-23 — End: 2013-01-23

## 2013-01-23 MED ORDER — FESOTERODINE FUMARATE ER 4 MG PO TB24
4.0000 mg | ORAL_TABLET | Freq: Every day | ORAL | Status: DC
  Administered 2013-01-23: 4 mg via ORAL
  Filled 2013-01-23: qty 1

## 2013-01-23 MED ORDER — MULTI-VITAMIN/MINERALS PO TABS: 1.0000 | ORAL_TABLET | Freq: Every day | ORAL | Status: DC

## 2013-01-23 MED ORDER — ONDANSETRON HCL 4 MG/2ML IJ SOLN: 4.0000 mg | Freq: Four times a day (QID) | INTRAMUSCULAR | Status: DC | PRN

## 2013-01-23 MED ORDER — ALBUTEROL SULFATE HFA 108 (90 BASE) MCG/ACT IN AERS
2.0000 | INHALATION_SPRAY | RESPIRATORY_TRACT | Status: DC | PRN
  Filled 2013-01-23: qty 6.7

## 2013-01-23 MED ORDER — LITHIUM CARBONATE ER 300 MG PO TBCR
600.0000 mg | EXTENDED_RELEASE_TABLET | Freq: Once | ORAL | Status: AC
  Administered 2013-01-23: 600 mg via ORAL
  Filled 2013-01-23: qty 2

## 2013-01-23 MED ORDER — LITHIUM CARBONATE 300 MG PO CAPS
600.0000 mg | ORAL_CAPSULE | Freq: Every day | ORAL | Status: DC
  Filled 2013-01-23: qty 2

## 2013-01-23 MED ORDER — ZOLPIDEM TARTRATE 5 MG PO TABS: 5.0000 mg | ORAL_TABLET | Freq: Every evening | ORAL | Status: DC | PRN

## 2013-01-23 MED ORDER — VITAMIN D3 25 MCG (1000 UNIT) PO TABS
2000.0000 [IU] | ORAL_TABLET | Freq: Every day | ORAL | Status: DC
  Administered 2013-01-23: 2000 [IU] via ORAL
  Filled 2013-01-23: qty 2

## 2013-01-23 MED ORDER — SIMVASTATIN 10 MG PO TABS
10.0000 mg | ORAL_TABLET | Freq: Every day | ORAL | Status: DC
  Filled 2013-01-23: qty 1

## 2013-01-23 MED ORDER — SODIUM CHLORIDE 0.9 % IV SOLN
INTRAVENOUS | Status: DC
  Administered 2013-01-23 (×2): via INTRAVENOUS

## 2013-01-23 MED ORDER — ENOXAPARIN SODIUM 40 MG/0.4ML ~~LOC~~ SOLN: 40.0000 mg | Freq: Every day | SUBCUTANEOUS | Status: DC

## 2013-01-23 MED ORDER — CELECOXIB 200 MG PO CAPS
200.0000 mg | ORAL_CAPSULE | Freq: Every day | ORAL | Status: DC
  Administered 2013-01-23: 200 mg via ORAL
  Filled 2013-01-23: qty 1

## 2013-01-23 MED ORDER — METHYLPHENIDATE HCL ER (LA) 10 MG PO CP24: 40.0000 mg | ORAL_CAPSULE | ORAL | Status: DC

## 2013-01-23 MED ORDER — LEVOTHYROXINE SODIUM 150 MCG PO TABS
150.0000 ug | ORAL_TABLET | Freq: Every day | ORAL | Status: DC
  Filled 2013-01-23: qty 1

## 2013-01-23 MED ORDER — MORPHINE SULFATE 2 MG/ML IJ SOLN: 2.0000 mg | INTRAMUSCULAR | Status: DC | PRN

## 2013-01-23 MED ORDER — CITALOPRAM HYDROBROMIDE 20 MG PO TABS
20.0000 mg | ORAL_TABLET | Freq: Every day | ORAL | Status: DC
  Administered 2013-01-23: 20 mg via ORAL
  Filled 2013-01-23: qty 1

## 2013-01-23 MED ORDER — LITHIUM CARBONATE 300 MG PO TABS: 300.0000 mg | ORAL_TABLET | Freq: Two times a day (BID) | ORAL | Status: DC

## 2013-01-23 MED ORDER — ACETYLCYSTEINE 600 MG PO CAPS: 600.0000 mg | ORAL_CAPSULE | Freq: Two times a day (BID) | ORAL | Status: DC

## 2013-01-23 MED ORDER — NITROGLYCERIN 0.4 MG SL SUBL: 0.4000 mg | SUBLINGUAL_TABLET | SUBLINGUAL | Status: DC | PRN

## 2013-01-23 MED ORDER — ENOXAPARIN SODIUM 30 MG/0.3ML ~~LOC~~ SOLN
30.0000 mg | SUBCUTANEOUS | Status: DC
  Filled 2013-01-23: qty 0.3

## 2013-01-23 MED ORDER — CYCLOSPORINE 0.05 % OP EMUL
1.0000 [drp] | Freq: Two times a day (BID) | OPHTHALMIC | Status: DC
  Filled 2013-01-23 (×3): qty 1

## 2013-01-23 NOTE — Progress Notes (Signed)
Patient alert and oriented x4. Pt given discharge instructions and patient denies any questions or concerns.  IV access removed cannula intact no drainage, bleeding to site.  Pt to be discharged with husband.

## 2013-01-23 NOTE — ED Notes (Signed)
Report called to [facility] on 2w

## 2013-01-23 NOTE — ED Notes (Signed)
Pt states that she has been having chest pain since 6:10p and subsided around 6:45p and chest pain returned around 1245a. Pt states she did not take any ntg or any other medication.

## 2013-01-23 NOTE — Progress Notes (Signed)
PHARMACIST - PHYSICIAN ORDER COMMUNICATION  CONCERNING: P&T Medication Policy on Herbal Medications  DESCRIPTION:  This patient's order for:  Acetylcysteine Capsules has been noted.  This specific product(s) is classified as an OTC "herbal" or natural product.  Due to a lack of definitive safety studies or FDA approval, nonstandard manufacturing practices, plus the potential risk of unknown drug-drug interactions while on inpatient medications, the Pharmacy and Therapeutics Committee does not permit the use of "herbal" or natural products of this type within [facility].   ACTION TAKEN: The pharmacy department is unable to verify this order at this time and your patient has been informed of this safety policy. Please reevaluate patient's clinical condition at discharge and address if the herbal or natural product(s) should be resumed at that time.

## 2013-01-23 NOTE — Progress Notes (Signed)
Patient is to be discharged to home

## 2013-01-23 NOTE — Care Management Note (Unsigned)
    Page 1 of 1   01/23/2013     3:51:40 PM   CARE MANAGEMENT NOTE 01/23/2013  Patient:  Jessica [name], Jessica [name]   Account Number:  [id number]  Date Initiated:  01/23/2013  Documentation initiated by:  Ramir Malerba  Subjective/Objective Assessment:   PT ADM ON 01/22/13 WITH CHEST PAIN, NAUSEA.  PTA, PT INDEPENDENT, LIVES WITH SPOUSE.     Action/Plan:   WILL FOLLOW FOR HOME NEEDS AS PT PROGRESSES.   Anticipated DC Date:  01/24/2013   Anticipated DC Plan:  HOME/SELF CARE      DC Planning Services  CM consult      Choice offered to / List presented to:             Status of service:  In process, will continue to follow Medicare Important Message given?   (If response is "NO", the following Medicare IM given date fields will be blank) Date Medicare IM given:   Date Additional Medicare IM given:    Discharge Disposition:    Per UR Regulation:  Reviewed for med. necessity/level of care/duration of stay  If discussed at Long Length of Stay Meetings, dates discussed:    Comments:

## 2013-01-23 NOTE — ED Provider Notes (Signed)
CSN: [id number]     Arrival date & time 01/22/13  1920 History   None    Chief Complaint  Patient presents with  . Chest Pain   (Consider location/radiation/quality/duration/timing/severity/associated sxs/prior Treatment) HPI History provided by patient. Tonight at church developed substernal chest pain, pressure - like and burning in quality and moderate in severity. She had associated nausea without vomiting. No known aggravating or alleviating factors. No shortness of breath. This lasted about 30 minutes patient presented here with mild 1/10 chest pain and triaged, now 0/10. She has history of hypertension but no known history of heart disease. She is treated for acid reflux but denies any heartburn symptoms otherwise. No fevers. No cough. No leg pain or leg swelling. No history of same  Past Medical History  Diagnosis Date  . Hypertension   . Thyroid disease   . Bipolar disorder    Past Surgical History  Procedure Laterality Date  . Tonsillectomy    . Leg surgery    . Dilation and curettage of uterus     No family history on file. History  Substance Use Topics  . Smoking status: Never Smoker   . Smokeless tobacco: Not on file  . Alcohol Use: Yes   OB History   Grav Para Term Preterm Abortions TAB SAB Ect Mult Living                 Review of Systems  Constitutional: Negative for fever and chills.  HENT: Negative for neck pain.   Respiratory: Negative for shortness of breath.   Cardiovascular: Positive for chest pain.  Gastrointestinal: Negative for abdominal pain.  Genitourinary: Negative for flank pain.  Musculoskeletal: Negative for back pain.  Skin: Negative for rash.  Neurological: Negative for headaches.  All other systems reviewed and are negative.    Allergies  Erythromycin base  Home Medications  No current outpatient prescriptions on file. BP 125/76  Pulse 66  Temp(Src) 98.1 F (36.7 C) (Oral)  Resp 13  SpO2 95% Physical Exam  Constitutional:  She is oriented to person, place, and time. She appears well-developed and well-nourished.  HENT:  Head: Normocephalic and atraumatic.  Eyes: EOM are normal. Pupils are equal, round, and reactive to light.  Neck: Neck supple.  Cardiovascular: Normal rate, regular rhythm and intact distal pulses.   Pulmonary/Chest: Effort normal and breath sounds normal. No respiratory distress. She exhibits no tenderness.  Abdominal: Soft. Bowel sounds are normal. She exhibits no distension. There is no tenderness.  Musculoskeletal: Normal range of motion. She exhibits no edema.  No calf tenderness  Neurological: She is alert and oriented to person, place, and time.  Skin: Skin is warm and dry.    ED Course  Procedures (including critical care time) Labs Review Labs Reviewed  BASIC METABOLIC PANEL - Abnormal; Notable for the following:    Sodium 134 (*)    Glucose, Bld 100 (*)    Creatinine, Ser 1.12 (*)    GFR calc non Af Amer 51 (*)    GFR calc Af Amer 59 (*)    All other components within normal limits  CBC - Abnormal; Notable for the following:    WBC 11.1 (*)    All other components within normal limits  POCT I-STAT TROPONIN I   Imaging Review Dg Chest Portable 1 View  01/23/2013   CLINICAL DATA:  Chest pain.  EXAM: PORTABLE CHEST - 1 VIEW  COMPARISON:  Single view of the chest 09/04/2005.  FINDINGS: There is  mild cardiomegaly without edema. Lungs are clear. No pneumothorax or pleural effusion.  IMPRESSION: Cardiomegaly without acute disease.   Electronically Signed   By: Drusilla Kanner M.D.   On: 01/23/2013 01:25     Date: 01/23/2013  Rate: 67  Rhythm: normal sinus rhythm  QRS Axis: normal  Intervals: normal  ST/T Wave abnormalities: nonspecific ST/T changes  Conduction Disutrbances:none  Narrative Interpretation:   Old EKG Reviewed: none available  ASA provided  1:46 AM CP free. D/w Dr Izola Price, will admit.   MDM  Dx: CP  ECG, labs, CXR MED admit    Jessica Nielsen,  MD 01/23/13 [phone]

## 2013-01-23 NOTE — Progress Notes (Addendum)
Paged Rhona Leavens, MD re: discharge.

## 2013-01-23 NOTE — ED Notes (Signed)
Per pharmacy, pt will receive all sign and held order medications when they arrive on the floor, pharmacy states the medications have not been verified and will not be until the pt is on the floor.

## 2013-01-23 NOTE — ED Notes (Signed)
Nurse First Rounds: Nurse  explained delay ., wait time and process to pt. Respirations unlbaored .

## 2013-01-23 NOTE — H&P (Signed)
[facility] History and Physical  Jessica [name] AOZ:[id number] DOB: 1951/04/29 DOA: 01/23/2013  Referring physician: ED physician PCP: No primary provider on file.   Chief Complaint: chest pain   HPI:  Patient is 62 year old female who presents to [facility] emergency department with sudden onset substernal chest pain that she describes as pressure-like, started earlier today prior to admission, lasted approximately 30 minutes, 7/10 in severity and radiating to the left arm and left neck area, associated with nausea but no vomiting, no specific alleviating or aggravating factors. Patient has never experienced similar episodes in the past and was reported.heart attack. She explains that pain is now resolved and she feels no pressure in the chest. She denies fevers and chills, no shortness of breath, no recent hospitalizations. She reports compliance with her medications. She denies any specific abdominal or urinary concerns, no focal neurological symptoms.  Assessment and Plan:  Principal Problem:   Chest pain - Unclear etiology, will admit to telemetry floor for observation and further evaluation of chest pain - Will start with providing supportive care with nitroglycerin sublingual as needed, analgesia and antiemetics as needed - Oxygen via nasal cannula to keep oxygen saturations above 92% - Check TSH, cycle cardiac enzymes, check 12-lead EKG Active Problems:   ARF (acute renal failure) - Most likely secondary to prerenal etiology, we'll provide IV fluids and repeat BMP in the morning - Monitor urine output   HTN (hypertension) - Reasonable blood pressure on admission, monitor vitals per floor protocol   Leukocytosis - Unclear etiology, possibly related to the emergency and stress, no clear signs of infectious etiology - Will repeat CBC in the morning   HLD (hyperlipidemia) - Will check lipid panel, continue statin as per home medical regimen  Code Status: Full Family  Communication: Pt at bedside Disposition Plan: PT evaluation   Review of Systems:  Constitutional: Negative for fever, chills. Negative for diaphoresis.  HENT: Negative for hearing loss, ear pain, nosebleeds, congestion, sore throat, neck pain, tinnitus and ear discharge.   Eyes: Negative for blurred vision, double vision, photophobia, pain, discharge and redness.  Respiratory: Negative for cough, hemoptysis, sputum production, shortness of breath, wheezing and stridor.   Cardiovascular: Negative for  palpitations, orthopnea, claudication and leg swelling.  Gastrointestinal: Negative for  vomiting and abdominal pain. Negative for heartburn, constipation, blood in stool and melena.  Genitourinary: Negative for dysuria, urgency, frequency, hematuria and flank pain.  Musculoskeletal: Negative for myalgias, back pain, joint pain and falls.  Skin: Negative for itching and rash.  Neurological:  Negative for tingling, tremors, sensory change, speech change, focal weakness, loss of consciousness and headaches.  Endo/Heme/Allergies: Negative for environmental allergies and polydipsia. Does not bruise/bleed easily.  Psychiatric/Behavioral: Negative for suicidal ideas. The patient is not nervous/anxious.      Past Medical History  Diagnosis Date  . Hypertension   . Thyroid disease   . Bipolar disorder     Past Surgical History  Procedure Laterality Date  . Tonsillectomy    . Leg surgery    . Dilation and curettage of uterus      Social History:  reports that she has never smoked. She does not have any smokeless tobacco history on file. She reports that  drinks alcohol. She reports that she does not use illicit drugs.  Allergies  Allergen Reactions  . Advil [Ibuprofen] Diarrhea and Nausea Only  . Erythromycin Base Diarrhea    No known family medical history  Prior to Admission medications   Medication  Sig Start Date End Date Taking? Authorizing Provider  acetaminophen (TYLENOL  ARTHRITIS PAIN) 650 MG CR tablet Take 1,300 mg by mouth every 4 (four) hours as needed for pain.   Yes Historical Provider, MD  Acetylcysteine (NAC) 600 MG CAPS Take 600 mg by mouth 2 (two) times daily.   Yes Historical Provider, MD  ambrisentan (LETAIRIS) 5 MG tablet Take 5 mg by mouth at bedtime as needed (sleep).   Yes Historical Provider, MD  celecoxib (CELEBREX) 200 MG capsule Take 200 mg by mouth daily.   Yes Historical Provider, MD  cholecalciferol (VITAMIN D) 1000 UNITS tablet Take 2,000 Units by mouth daily.   Yes Historical Provider, MD  citalopram (CELEXA) 20 MG tablet Take 20 mg by mouth daily.   Yes Historical Provider, MD  cycloSPORINE (RESTASIS) 0.05 % ophthalmic emulsion Place 1 drop into both eyes 2 (two) times daily.   Yes Historical Provider, MD  levothyroxine (SYNTHROID, LEVOTHROID) 150 MCG tablet Take 150 mcg by mouth daily before breakfast.   Yes Historical Provider, MD  lithium 300 MG tablet Take 300-600 mg by mouth 2 (two) times daily. 300mg  in the morning and 600mg  in the evening.   Yes Historical Provider, MD  methylphenidate (RITALIN LA) 40 MG 24 hr capsule Take 40 mg by mouth every morning.   Yes Historical Provider, MD  mometasone (NASONEX) 50 MCG/ACT nasal spray Place 2 sprays into the nose as needed (allergies).   Yes Historical Provider, MD  Multiple Vitamins-Minerals (MULTIVITAMIN WITH MINERALS) tablet Take 1 tablet by mouth daily.   Yes Historical Provider, MD  pravastatin (PRAVACHOL) 20 MG tablet Take 20 mg by mouth daily.   Yes Historical Provider, MD  tolterodine (DETROL LA) 2 MG 24 hr capsule Take 2 mg by mouth 2 (two) times daily.   Yes Historical Provider, MD  clindamycin (CLEOCIN) 150 MG capsule Take 150 mg by mouth 4 (four) times daily.    Historical Provider, MD  methylPREDNISolone (MEDROL DOSEPAK) 4 MG tablet Take by mouth. follow package directions    Historical Provider, MD    Physical Exam: Filed Vitals:   01/23/13 0102 01/23/13 0123 01/23/13 0200  01/23/13 0223  BP: 125/76 113/74 116/71 116/71  Pulse: 66 66 65 65  Temp:      TempSrc:      Resp: 13 14 15 15   SpO2: 95% 99% 96% 99%    Physical Exam  Constitutional: Appears well-developed and well-nourished. No distress.  HENT: Normocephalic. External right and left ear normal. Oropharynx is clear and moist.  Eyes: Conjunctivae and EOM are normal. PERRLA, no scleral icterus.  Neck: Normal ROM. Neck supple. No JVD. No tracheal deviation. No thyromegaly.  CVS: RRR, S1/S2 +, no murmurs, no gallops, no carotid bruit.  Pulmonary: Effort and breath sounds normal, no stridor, rhonchi, wheezes, rales.  Abdominal: Soft. BS +,  no distension, tenderness, rebound or guarding.  Musculoskeletal: Normal range of motion. No edema and no tenderness.  Lymphadenopathy: No lymphadenopathy noted, cervical, inguinal. Neuro: Alert. Normal reflexes, muscle tone coordination. No cranial nerve deficit. Skin: Skin is warm and dry. No rash noted. Not diaphoretic. No erythema. No pallor.  Psychiatric: Normal mood and affect. Behavior, judgment, thought content normal.   Labs on Admission:  Basic Metabolic Panel:  Recent Labs Lab 01/22/13 1937  NA 134*  K 4.3  CL 102  CO2 23  GLUCOSE 100*  BUN 18  CREATININE 1.12*  CALCIUM 9.4   CBC:  Recent Labs Lab 01/22/13 1937  WBC 11.1*  HGB 13.0  HCT 39.2  MCV 95.4  PLT 366    Radiological Exams on Admission: Dg Chest Portable 1 View  01/23/2013   CLINICAL DATA:  Chest pain.  EXAM: PORTABLE CHEST - 1 VIEW  COMPARISON:  Single view of the chest 09/04/2005.  FINDINGS: There is mild cardiomegaly without edema. Lungs are clear. No pneumothorax or pleural effusion.  IMPRESSION: Cardiomegaly without acute disease.   Electronically Signed   By: Drusilla Kanner M.D.   On: 01/23/2013 01:25    EKG: Normal sinus rhythm, no ST/T wave changes  Debbora Presto, MD  [facility] Pager [phone]  If 7PM-7AM, please contact  night-coverage [email or web address] Password [facility] 01/23/2013, 2:53 AM

## 2013-01-23 NOTE — Discharge Summary (Addendum)
Physician Discharge Summary  KIRSTON LUTY ZOX:[id number] DOB: 1950/06/19 DOA: 01/23/2013  PCP: No primary provider on file.  Admit date: 01/23/2013 Discharge date: 01/23/2013  Time spent: 35 minutes  Recommendations for Outpatient Follow-up:  Follow up with PCP in 1-2 weeks  Discharge Diagnoses:  Principal Problem:   Chest pain Active Problems:   ARF (acute renal failure)   HTN (hypertension)   Leukocytosis   HLD (hyperlipidemia)   Discharge Condition: Stable  Diet recommendation: Regular  Filed Weights   01/23/13 0845  Weight: 119.5 kg (263 lb 7.2 oz)   History of present illness:  Patient is 62 year old female who presents to [facility] emergency department with sudden onset substernal chest pain that she describes as pressure-like, started earlier today prior to admission, lasted approximately 30 minutes, 7/10 in severity and radiating to the left arm and left neck area, associated with nausea but no vomiting, no specific alleviating or aggravating factors. Patient has never experienced similar episodes in the past and was reported.heart attack. She explains that pain is now resolved and she feels no pressure in the chest. She denies fevers and chills, no shortness of breath, no recent hospitalizations. She reports compliance with her medications. She denies any specific abdominal or urinary concerns, no focal neurological symptoms.  Hospital Course:  The patient was admitted to the floor. Serial cardiac enzymes were found to be negative x 3. The patient was also continued on gentle IVF given a mildly elevated Cr of 1.12, which normalized to 0.67 in the morning. The patient remained medically stable for close outpatient follow up. On further questioning, the patient reports pain with palpation over the chest in the setting of "bad arthritis." She was advised to take OTC nsaids and ice packs as needed for symptom relief.  Discharge Exam: Filed Vitals:   01/23/13 0645 01/23/13  0753 01/23/13 0845 01/23/13 1404  BP:  119/44 130/82 115/69  Pulse: 74 81 70 69  Temp:   98.2 F (36.8 C) 98.8 F (37.1 C)  TempSrc:   Oral Oral  Resp: 17 16 18 17   Height:   5\' 3"  (1.6 m)   Weight:   119.5 kg (263 lb 7.2 oz)   SpO2: 97% 97% 95% 95%    General: Awake, in nad Cardiovascular: regular, s1, s2 Respiratory: normal resp effort, end-expiratory wheezing  Discharge Instructions     Medication List         celecoxib 200 MG capsule  Commonly known as:  CELEBREX  Take 200 mg by mouth daily.     cholecalciferol 1000 UNITS tablet  Commonly known as:  VITAMIN D  Take 2,000 Units by mouth daily.     citalopram 20 MG tablet  Commonly known as:  CELEXA  Take 20 mg by mouth daily.     clindamycin 150 MG capsule  Commonly known as:  CLEOCIN  Take 150 mg by mouth 4 (four) times daily.     levothyroxine 150 MCG tablet  Commonly known as:  SYNTHROID, LEVOTHROID  Take 150 mcg by mouth daily before breakfast.     lithium 300 MG tablet  Take 300-600 mg by mouth 2 (two) times daily. 300mg  in the morning and 600mg  in the evening.     methylphenidate 40 MG 24 hr capsule  Commonly known as:  RITALIN LA  Take 40 mg by mouth every morning.     methylPREDNISolone 4 MG tablet  Commonly known as:  MEDROL DOSEPAK  Take by mouth. follow package directions  mometasone 50 MCG/ACT nasal spray  Commonly known as:  NASONEX  Place 2 sprays into the nose as needed (allergies).     multivitamin with minerals tablet  Take 1 tablet by mouth daily.     NAC 600 MG Caps  Generic drug:  Acetylcysteine  Take 600 mg by mouth 2 (two) times daily.     pravastatin 20 MG tablet  Commonly known as:  PRAVACHOL  Take 20 mg by mouth daily.     RESTASIS 0.05 % ophthalmic emulsion  Generic drug:  cycloSPORINE  Place 1 drop into both eyes 2 (two) times daily.     tolterodine 2 MG 24 hr capsule  Commonly known as:  DETROL LA  Take 2 mg by mouth 2 (two) times daily.     TYLENOL  ARTHRITIS PAIN 650 MG CR tablet  Generic drug:  acetaminophen  Take 1,300 mg by mouth every 4 (four) hours as needed for pain.     zolpidem 5 MG tablet  Commonly known as:  AMBIEN  Take 5 mg by mouth at bedtime as needed for sleep.       Allergies  Allergen Reactions  . Advil [Ibuprofen] Diarrhea and Nausea Only  . Erythromycin Base Diarrhea   Follow-up Information   Schedule an appointment as soon as possible for a visit with follow up with your PCP in 1-2 weeks.       The results of significant diagnostics from this hospitalization (including imaging, microbiology, ancillary and laboratory) are listed below for reference.    Significant Diagnostic Studies: Dg Chest Portable 1 View  01/23/2013   CLINICAL DATA:  Chest pain.  EXAM: PORTABLE CHEST - 1 VIEW  COMPARISON:  Single view of the chest 09/04/2005.  FINDINGS: There is mild cardiomegaly without edema. Lungs are clear. No pneumothorax or pleural effusion.  IMPRESSION: Cardiomegaly without acute disease.   Electronically Signed   By: Drusilla Kanner M.D.   On: 01/23/2013 01:25    Microbiology: No results found for this or any previous visit (from the past 240 hour(s)).   Labs: Basic Metabolic Panel:  Recent Labs Lab 01/22/13 1937 01/23/13 0350  NA 134* 141  K 4.3 4.1  CL 102 110  CO2 23 23  GLUCOSE 100* 99  BUN 18 15  CREATININE 1.12* 0.67  CALCIUM 9.4 8.9   Liver Function Tests: No results found for this basename: AST, ALT, ALKPHOS, BILITOT, PROT, ALBUMIN,  in the last 168 hours No results found for this basename: LIPASE, AMYLASE,  in the last 168 hours No results found for this basename: AMMONIA,  in the last 168 hours CBC:  Recent Labs Lab 01/22/13 1937 01/23/13 1105  WBC 11.1* 10.8*  HGB 13.0 12.3  HCT 39.2 37.9  MCV 95.4 97.4  PLT 366 363   Cardiac Enzymes:  Recent Labs Lab 01/23/13 0350 01/23/13 1105  TROPONINI <0.30 <0.30   BNP: BNP (last 3 results) No results found for this  basename: PROBNP,  in the last 8760 hours CBG: No results found for this basename: GLUCAP,  in the last 168 hours  Signed:  Tewana Bohlen K  [facility] 01/23/2013, 3:13 PM

## 2013-01-25 DIAGNOSIS — M19049 Primary osteoarthritis, unspecified hand: Secondary | ICD-10-CM | POA: Diagnosis not present

## 2013-01-25 DIAGNOSIS — K2970 Gastritis, unspecified, without bleeding: Secondary | ICD-10-CM | POA: Diagnosis not present

## 2013-01-25 DIAGNOSIS — M542 Cervicalgia: Secondary | ICD-10-CM | POA: Diagnosis not present

## 2013-01-25 DIAGNOSIS — M545 Low back pain, unspecified: Secondary | ICD-10-CM | POA: Diagnosis not present

## 2013-01-31 DIAGNOSIS — F319 Bipolar disorder, unspecified: Secondary | ICD-10-CM | POA: Diagnosis not present

## 2013-01-31 DIAGNOSIS — H9209 Otalgia, unspecified ear: Secondary | ICD-10-CM | POA: Diagnosis not present

## 2013-03-05 DIAGNOSIS — M5430 Sciatica, unspecified side: Secondary | ICD-10-CM | POA: Diagnosis not present

## 2013-03-05 DIAGNOSIS — H6990 Unspecified Eustachian tube disorder, unspecified ear: Secondary | ICD-10-CM | POA: Diagnosis not present

## 2013-04-10 DIAGNOSIS — M545 Low back pain, unspecified: Secondary | ICD-10-CM | POA: Diagnosis not present

## 2013-04-10 DIAGNOSIS — M25549 Pain in joints of unspecified hand: Secondary | ICD-10-CM | POA: Diagnosis not present

## 2013-04-10 DIAGNOSIS — M5137 Other intervertebral disc degeneration, lumbosacral region: Secondary | ICD-10-CM | POA: Diagnosis not present

## 2013-04-10 DIAGNOSIS — G5700 Lesion of sciatic nerve, unspecified lower limb: Secondary | ICD-10-CM | POA: Diagnosis not present

## 2013-04-19 DIAGNOSIS — F319 Bipolar disorder, unspecified: Secondary | ICD-10-CM | POA: Diagnosis not present

## 2013-04-27 DIAGNOSIS — J40 Bronchitis, not specified as acute or chronic: Secondary | ICD-10-CM | POA: Diagnosis not present

## 2013-04-27 DIAGNOSIS — J029 Acute pharyngitis, unspecified: Secondary | ICD-10-CM | POA: Diagnosis not present

## 2013-06-22 DIAGNOSIS — H04129 Dry eye syndrome of unspecified lacrimal gland: Secondary | ICD-10-CM | POA: Diagnosis not present

## 2013-06-25 DIAGNOSIS — N3000 Acute cystitis without hematuria: Secondary | ICD-10-CM | POA: Diagnosis not present

## 2013-06-25 DIAGNOSIS — E782 Mixed hyperlipidemia: Secondary | ICD-10-CM | POA: Diagnosis not present

## 2013-06-25 DIAGNOSIS — D239 Other benign neoplasm of skin, unspecified: Secondary | ICD-10-CM | POA: Diagnosis not present

## 2013-06-25 DIAGNOSIS — R3915 Urgency of urination: Secondary | ICD-10-CM | POA: Diagnosis not present

## 2013-06-25 DIAGNOSIS — I10 Essential (primary) hypertension: Secondary | ICD-10-CM | POA: Diagnosis not present

## 2013-06-25 DIAGNOSIS — M159 Polyosteoarthritis, unspecified: Secondary | ICD-10-CM | POA: Diagnosis not present

## 2013-06-25 DIAGNOSIS — E669 Obesity, unspecified: Secondary | ICD-10-CM | POA: Diagnosis not present

## 2013-07-10 DIAGNOSIS — F3132 Bipolar disorder, current episode depressed, moderate: Secondary | ICD-10-CM | POA: Diagnosis not present

## 2013-07-11 DIAGNOSIS — F319 Bipolar disorder, unspecified: Secondary | ICD-10-CM | POA: Diagnosis not present

## 2013-07-19 DIAGNOSIS — R5381 Other malaise: Secondary | ICD-10-CM | POA: Diagnosis not present

## 2013-07-19 DIAGNOSIS — R351 Nocturia: Secondary | ICD-10-CM | POA: Diagnosis not present

## 2013-07-19 DIAGNOSIS — R5383 Other fatigue: Secondary | ICD-10-CM | POA: Diagnosis not present

## 2013-07-19 DIAGNOSIS — G479 Sleep disorder, unspecified: Secondary | ICD-10-CM | POA: Diagnosis not present

## 2013-07-25 DIAGNOSIS — G4733 Obstructive sleep apnea (adult) (pediatric): Secondary | ICD-10-CM | POA: Diagnosis not present

## 2013-07-25 DIAGNOSIS — R351 Nocturia: Secondary | ICD-10-CM | POA: Diagnosis not present

## 2013-07-25 DIAGNOSIS — R5383 Other fatigue: Secondary | ICD-10-CM | POA: Diagnosis not present

## 2013-07-25 DIAGNOSIS — R5381 Other malaise: Secondary | ICD-10-CM | POA: Diagnosis not present

## 2013-07-25 DIAGNOSIS — E669 Obesity, unspecified: Secondary | ICD-10-CM | POA: Diagnosis not present

## 2013-07-25 DIAGNOSIS — G4710 Hypersomnia, unspecified: Secondary | ICD-10-CM | POA: Diagnosis not present

## 2013-09-06 ENCOUNTER — Other Ambulatory Visit (HOSPITAL_BASED_OUTPATIENT_CLINIC_OR_DEPARTMENT_OTHER): Payer: Self-pay | Admitting: Rheumatology

## 2013-09-06 DIAGNOSIS — M545 Low back pain, unspecified: Secondary | ICD-10-CM | POA: Diagnosis not present

## 2013-09-06 DIAGNOSIS — Z79899 Other long term (current) drug therapy: Secondary | ICD-10-CM | POA: Diagnosis not present

## 2013-09-06 DIAGNOSIS — F319 Bipolar disorder, unspecified: Secondary | ICD-10-CM | POA: Diagnosis not present

## 2013-09-06 DIAGNOSIS — M25549 Pain in joints of unspecified hand: Secondary | ICD-10-CM | POA: Diagnosis not present

## 2013-09-06 DIAGNOSIS — M5137 Other intervertebral disc degeneration, lumbosacral region: Secondary | ICD-10-CM | POA: Diagnosis not present

## 2013-09-06 DIAGNOSIS — R262 Difficulty in walking, not elsewhere classified: Secondary | ICD-10-CM

## 2013-09-18 ENCOUNTER — Other Ambulatory Visit: Payer: Self-pay | Admitting: Rheumatology

## 2013-09-18 ENCOUNTER — Ambulatory Visit (HOSPITAL_BASED_OUTPATIENT_CLINIC_OR_DEPARTMENT_OTHER): Payer: Medicare Other

## 2013-09-18 DIAGNOSIS — R262 Difficulty in walking, not elsewhere classified: Secondary | ICD-10-CM

## 2013-09-18 DIAGNOSIS — M545 Low back pain, unspecified: Secondary | ICD-10-CM

## 2013-09-26 ENCOUNTER — Other Ambulatory Visit: Payer: Medicare Other

## 2013-09-26 ENCOUNTER — Ambulatory Visit (INDEPENDENT_AMBULATORY_CARE_PROVIDER_SITE_OTHER): Payer: Medicare Other

## 2013-09-26 DIAGNOSIS — M47817 Spondylosis without myelopathy or radiculopathy, lumbosacral region: Secondary | ICD-10-CM | POA: Diagnosis not present

## 2013-09-26 DIAGNOSIS — R262 Difficulty in walking, not elsewhere classified: Secondary | ICD-10-CM

## 2013-09-26 DIAGNOSIS — M5126 Other intervertebral disc displacement, lumbar region: Secondary | ICD-10-CM

## 2013-09-26 DIAGNOSIS — M545 Low back pain, unspecified: Secondary | ICD-10-CM

## 2013-09-26 DIAGNOSIS — M48061 Spinal stenosis, lumbar region without neurogenic claudication: Secondary | ICD-10-CM | POA: Diagnosis not present

## 2013-09-26 DIAGNOSIS — M5137 Other intervertebral disc degeneration, lumbosacral region: Secondary | ICD-10-CM

## 2013-10-09 DIAGNOSIS — K120 Recurrent oral aphthae: Secondary | ICD-10-CM | POA: Diagnosis not present

## 2013-10-31 DIAGNOSIS — Z6841 Body Mass Index (BMI) 40.0 and over, adult: Secondary | ICD-10-CM | POA: Diagnosis not present

## 2013-10-31 DIAGNOSIS — M549 Dorsalgia, unspecified: Secondary | ICD-10-CM | POA: Diagnosis not present

## 2013-11-12 DIAGNOSIS — J069 Acute upper respiratory infection, unspecified: Secondary | ICD-10-CM | POA: Diagnosis not present

## 2013-11-13 DIAGNOSIS — F319 Bipolar disorder, unspecified: Secondary | ICD-10-CM | POA: Diagnosis not present

## 2013-12-12 DIAGNOSIS — G4730 Sleep apnea, unspecified: Secondary | ICD-10-CM | POA: Diagnosis not present

## 2013-12-12 DIAGNOSIS — H0289 Other specified disorders of eyelid: Secondary | ICD-10-CM | POA: Diagnosis not present

## 2013-12-12 DIAGNOSIS — H02839 Dermatochalasis of unspecified eye, unspecified eyelid: Secondary | ICD-10-CM | POA: Diagnosis not present

## 2013-12-12 DIAGNOSIS — H11829 Conjunctivochalasis, unspecified eye: Secondary | ICD-10-CM | POA: Diagnosis not present

## 2013-12-25 DIAGNOSIS — F39 Unspecified mood [affective] disorder: Secondary | ICD-10-CM | POA: Diagnosis not present

## 2013-12-25 DIAGNOSIS — E039 Hypothyroidism, unspecified: Secondary | ICD-10-CM | POA: Diagnosis not present

## 2013-12-25 DIAGNOSIS — E782 Mixed hyperlipidemia: Secondary | ICD-10-CM | POA: Diagnosis not present

## 2013-12-25 DIAGNOSIS — I10 Essential (primary) hypertension: Secondary | ICD-10-CM | POA: Diagnosis not present

## 2013-12-25 DIAGNOSIS — N318 Other neuromuscular dysfunction of bladder: Secondary | ICD-10-CM | POA: Diagnosis not present

## 2013-12-25 DIAGNOSIS — D485 Neoplasm of uncertain behavior of skin: Secondary | ICD-10-CM | POA: Diagnosis not present

## 2013-12-25 DIAGNOSIS — M159 Polyosteoarthritis, unspecified: Secondary | ICD-10-CM | POA: Diagnosis not present

## 2013-12-27 DIAGNOSIS — H11829 Conjunctivochalasis, unspecified eye: Secondary | ICD-10-CM | POA: Diagnosis not present

## 2013-12-30 DIAGNOSIS — M79609 Pain in unspecified limb: Secondary | ICD-10-CM | POA: Diagnosis not present

## 2013-12-31 DIAGNOSIS — G8929 Other chronic pain: Secondary | ICD-10-CM | POA: Diagnosis not present

## 2013-12-31 DIAGNOSIS — M47817 Spondylosis without myelopathy or radiculopathy, lumbosacral region: Secondary | ICD-10-CM | POA: Diagnosis not present

## 2013-12-31 DIAGNOSIS — M545 Low back pain, unspecified: Secondary | ICD-10-CM | POA: Diagnosis not present

## 2014-01-02 DIAGNOSIS — M545 Low back pain, unspecified: Secondary | ICD-10-CM | POA: Diagnosis not present

## 2014-01-02 DIAGNOSIS — M47817 Spondylosis without myelopathy or radiculopathy, lumbosacral region: Secondary | ICD-10-CM | POA: Diagnosis not present

## 2014-01-08 DIAGNOSIS — M79609 Pain in unspecified limb: Secondary | ICD-10-CM | POA: Diagnosis not present

## 2014-01-08 DIAGNOSIS — N318 Other neuromuscular dysfunction of bladder: Secondary | ICD-10-CM | POA: Diagnosis not present

## 2014-01-08 DIAGNOSIS — E039 Hypothyroidism, unspecified: Secondary | ICD-10-CM | POA: Diagnosis not present

## 2014-01-10 ENCOUNTER — Other Ambulatory Visit: Payer: Self-pay

## 2014-01-10 DIAGNOSIS — Z1231 Encounter for screening mammogram for malignant neoplasm of breast: Secondary | ICD-10-CM

## 2014-01-15 DIAGNOSIS — F319 Bipolar disorder, unspecified: Secondary | ICD-10-CM | POA: Diagnosis not present

## 2014-01-25 ENCOUNTER — Ambulatory Visit: Payer: Medicare Other

## 2014-01-29 DIAGNOSIS — M25569 Pain in unspecified knee: Secondary | ICD-10-CM | POA: Diagnosis not present

## 2014-01-29 DIAGNOSIS — R059 Cough, unspecified: Secondary | ICD-10-CM | POA: Diagnosis not present

## 2014-01-29 DIAGNOSIS — R05 Cough: Secondary | ICD-10-CM | POA: Diagnosis not present

## 2014-01-31 DIAGNOSIS — M5137 Other intervertebral disc degeneration, lumbosacral region: Secondary | ICD-10-CM | POA: Diagnosis not present

## 2014-01-31 DIAGNOSIS — M25569 Pain in unspecified knee: Secondary | ICD-10-CM | POA: Diagnosis not present

## 2014-01-31 DIAGNOSIS — M25579 Pain in unspecified ankle and joints of unspecified foot: Secondary | ICD-10-CM | POA: Diagnosis not present

## 2014-01-31 DIAGNOSIS — M79643 Pain in unspecified hand: Secondary | ICD-10-CM | POA: Diagnosis not present

## 2014-02-04 ENCOUNTER — Ambulatory Visit
Admission: RE | Admit: 2014-02-04 | Discharge: 2014-02-04 | Disposition: A | Discharge status: Home / Self Care | Payer: Medicare Other | Source: Ambulatory Visit | Attending: Family Medicine | Admitting: Family Medicine

## 2014-02-04 ENCOUNTER — Other Ambulatory Visit: Payer: Self-pay | Admitting: Family Medicine

## 2014-02-04 ENCOUNTER — Ambulatory Visit
Admission: RE | Admit: 2014-02-04 | Discharge: 2014-02-04 | Disposition: A | Discharge status: Home / Self Care | Payer: Medicare Other | Source: Ambulatory Visit

## 2014-02-04 DIAGNOSIS — R05 Cough: Secondary | ICD-10-CM

## 2014-02-04 DIAGNOSIS — Z1231 Encounter for screening mammogram for malignant neoplasm of breast: Secondary | ICD-10-CM

## 2014-02-04 DIAGNOSIS — R059 Cough, unspecified: Secondary | ICD-10-CM

## 2014-02-13 DIAGNOSIS — E039 Hypothyroidism, unspecified: Secondary | ICD-10-CM | POA: Diagnosis not present

## 2014-03-26 DIAGNOSIS — R350 Frequency of micturition: Secondary | ICD-10-CM | POA: Diagnosis not present

## 2014-03-26 DIAGNOSIS — Z23 Encounter for immunization: Secondary | ICD-10-CM | POA: Diagnosis not present

## 2014-03-26 DIAGNOSIS — N390 Urinary tract infection, site not specified: Secondary | ICD-10-CM | POA: Diagnosis not present

## 2014-03-26 DIAGNOSIS — N760 Acute vaginitis: Secondary | ICD-10-CM | POA: Diagnosis not present

## 2014-04-01 DIAGNOSIS — H11423 Conjunctival edema, bilateral: Secondary | ICD-10-CM | POA: Diagnosis not present

## 2014-04-01 DIAGNOSIS — H3530 Unspecified macular degeneration: Secondary | ICD-10-CM | POA: Diagnosis not present

## 2014-04-01 DIAGNOSIS — H0236 Blepharochalasis left eye, unspecified eyelid: Secondary | ICD-10-CM | POA: Diagnosis not present

## 2014-04-01 DIAGNOSIS — H02844 Edema of left upper eyelid: Secondary | ICD-10-CM | POA: Diagnosis not present

## 2014-04-01 DIAGNOSIS — H0233 Blepharochalasis right eye, unspecified eyelid: Secondary | ICD-10-CM | POA: Diagnosis not present

## 2014-04-09 DIAGNOSIS — F319 Bipolar disorder, unspecified: Secondary | ICD-10-CM | POA: Diagnosis not present

## 2014-04-18 DIAGNOSIS — R350 Frequency of micturition: Secondary | ICD-10-CM | POA: Diagnosis not present

## 2014-04-18 DIAGNOSIS — E039 Hypothyroidism, unspecified: Secondary | ICD-10-CM | POA: Diagnosis not present

## 2014-04-18 DIAGNOSIS — F3175 Bipolar disorder, in partial remission, most recent episode depressed: Secondary | ICD-10-CM | POA: Diagnosis not present

## 2014-04-18 DIAGNOSIS — J069 Acute upper respiratory infection, unspecified: Secondary | ICD-10-CM | POA: Diagnosis not present

## 2014-05-23 DIAGNOSIS — G5791 Unspecified mononeuropathy of right lower limb: Secondary | ICD-10-CM | POA: Diagnosis not present

## 2014-05-23 DIAGNOSIS — R05 Cough: Secondary | ICD-10-CM | POA: Diagnosis not present

## 2014-05-23 DIAGNOSIS — G4730 Sleep apnea, unspecified: Secondary | ICD-10-CM | POA: Diagnosis not present

## 2014-05-23 DIAGNOSIS — D489 Neoplasm of uncertain behavior, unspecified: Secondary | ICD-10-CM | POA: Diagnosis not present

## 2014-05-23 DIAGNOSIS — E039 Hypothyroidism, unspecified: Secondary | ICD-10-CM | POA: Diagnosis not present

## 2014-06-04 ENCOUNTER — Ambulatory Visit (INDEPENDENT_AMBULATORY_CARE_PROVIDER_SITE_OTHER): Payer: Medicare Other | Admitting: Endocrinology

## 2014-06-04 ENCOUNTER — Encounter: Payer: Self-pay | Admitting: Endocrinology

## 2014-06-04 VITALS — BP 126/82 | HR 84 | Temp 98.6°F | Wt 270.0 lb

## 2014-06-04 DIAGNOSIS — F319 Bipolar disorder, unspecified: Secondary | ICD-10-CM

## 2014-06-04 DIAGNOSIS — E039 Hypothyroidism, unspecified: Secondary | ICD-10-CM | POA: Diagnosis not present

## 2014-06-04 LAB — T3, FREE: T3, Free: 3.5 pg/mL (ref 2.3–4.2)

## 2014-06-04 LAB — TSH: TSH: 0.54 u[IU]/mL (ref 0.35–4.50)

## 2014-06-04 LAB — T4, FREE: Free T4: 1.32 ng/dL (ref 0.60–1.60)

## 2014-06-04 NOTE — Progress Notes (Signed)
Subjective:    Patient ID: Jessica [name], female    DOB: 10-21-1950, 64 y.o.   MRN: [id number]  HPI Pt reports hypothyroidism was dx'ed in 1980.  she has been on prescribed thyroid hormone therapy since then.  He has never taken kelp or any other type of non-prescribed thyroid product.  He has never had thyroid imaging.  He has never had thyroid surgery, or XRT to the neck.  He has never been on amiodarone, but she has been on Li++ since 2007.   Pt states slight swelling around the eyes, and assoc fatigue.  Pt says she does not like the fact her thyroid hormone has been rx'ed based on her blood tests.  She feels as though most of her sxs are thyroid-related, despite the normal blood tests.  She feels as though her thyroid medication should be increased.   Past Medical History  Diagnosis Date  . Hypertension   . Thyroid disease   . Bipolar disorder     Past Surgical History  Procedure Laterality Date  . Tonsillectomy    . Leg surgery    . Dilation and curettage of uterus      History   Social History  . Marital Status: Married    Spouse Name: N/A    Number of Children: N/A  . Years of Education: N/A   Occupational History  . Not on file.   Social History Main Topics  . Smoking status: Never Smoker   . Smokeless tobacco: Not on file  . Alcohol Use: Yes  . Drug Use: No  . Sexual Activity: Not on file   Other Topics Concern  . Not on file   Social History Narrative    Current Outpatient Prescriptions on File Prior to Visit  Medication Sig Dispense Refill  . acetaminophen (TYLENOL ARTHRITIS PAIN) 650 MG CR tablet Take 1,300 mg by mouth every 4 (four) hours as needed for pain.    . Acetylcysteine (NAC) 600 MG CAPS Take 600 mg by mouth 2 (two) times daily.    . celecoxib (CELEBREX) 200 MG capsule Take 200 mg by mouth daily.    . cholecalciferol (VITAMIN D) 1000 UNITS tablet Take 2,000 Units by mouth daily.    . citalopram (CELEXA) 20 MG tablet Take 20 mg by mouth  daily.    . clindamycin (CLEOCIN) 150 MG capsule Take 150 mg by mouth 4 (four) times daily.    . cycloSPORINE (RESTASIS) 0.05 % ophthalmic emulsion Place 1 drop into both eyes 2 (two) times daily.    Marland Kitchen levothyroxine (SYNTHROID, LEVOTHROID) 150 MCG tablet Take 150 mcg by mouth daily before breakfast.    . lithium 300 MG tablet Take 300-600 mg by mouth 2 (two) times daily. 300mg  in the morning and 600mg  in the evening.    . methylphenidate (RITALIN LA) 40 MG 24 hr capsule Take 40 mg by mouth every morning.    . mometasone (NASONEX) 50 MCG/ACT nasal spray Place 2 sprays into the nose as needed (allergies).    . Multiple Vitamins-Minerals (MULTIVITAMIN WITH MINERALS) tablet Take 1 tablet by mouth daily.    . pravastatin (PRAVACHOL) 20 MG tablet Take 20 mg by mouth daily.    Marland Kitchen tolterodine (DETROL LA) 2 MG 24 hr capsule Take 2 mg by mouth 2 (two) times daily.     No current facility-administered medications on file prior to visit.    Allergies  Allergen Reactions  . Advil [Ibuprofen] Diarrhea and Nausea Only  .  Erythromycin Base Diarrhea    Family History  Problem Relation Age of Onset  . Thyroid disease Mother     BP 126/82 mmHg  Pulse 84  Temp(Src) 98.6 F (37 C) (Oral)  Wt 270 lb (122.471 kg)  SpO2 96%  Review of Systems denies hair loss, muscle cramps, sob, constipation, numbness, cold intolerance, rhinorrhea, and syncope.  She has dry eyes, arthralgias, weight gain, dry skin, easy bruising, and insomnia.     Objective:   Physical Exam VS: see vs page GEN: no distress HEAD: head: no deformity eyes: no periorbital swelling, no proptosis external nose and ears are normal mouth: no lesion seen NECK: supple, thyroid is not enlarged CHEST WALL: no deformity LUNGS:  Clear to auscultation.  CV: reg rate and rhythm, no murmur ABD: abdomen is soft, nontender.  no hepatosplenomegaly.  not distended.  no hernia MUSCULOSKELETAL: muscle bulk and strength are grossly normal.  no  obvious joint swelling.  gait is normal and steady EXTEMITIES: no deformity.  no ulcer on the feet.  feet are of normal color and temp.  no edema PULSES: dorsalis pedis intact bilat.  no carotid bruit NEURO:  cn 2-12 grossly intact.   readily moves all 4's.  sensation is intact to touch on the feet SKIN:  Normal texture and temperature.  No rash or suspicious lesion is visible.   NODES:  None palpable at the neck PSYCH: alert, well-oriented.  Does not appear anxious nor depressed.   outside test results are reviewed: Free T4=1.29 TSH=1.19   Lab Results  Component Value Date   TSH 0.54 06/04/2014      Assessment & Plan:  Hypothyroidism, new to me, well-replaced Hyperthyroxinemia.  She requires a high T4 level in order to overcome the effect of Li++ in inhibiting T4 to T3 conversion Bipolar disorder, persistent.  We discussed the fact that thyroid labs do not necessarily correlate with sxs.  Patient is advised the following: Patient Instructions  blood tests are being requested for you today.  We'll let you know about the results. I would be happy to see you back here whenever you want.    addendum: Please continue the same medication.

## 2014-06-04 NOTE — Patient Instructions (Signed)
blood tests are being requested for you today.  We'll let you know about the results. I would be happy to see you back here whenever you want.

## 2014-06-10 DIAGNOSIS — R109 Unspecified abdominal pain: Secondary | ICD-10-CM | POA: Diagnosis not present

## 2014-06-17 DIAGNOSIS — G4733 Obstructive sleep apnea (adult) (pediatric): Secondary | ICD-10-CM | POA: Diagnosis not present

## 2014-07-02 DIAGNOSIS — F319 Bipolar disorder, unspecified: Secondary | ICD-10-CM | POA: Diagnosis not present

## 2014-07-04 DIAGNOSIS — H02831 Dermatochalasis of right upper eyelid: Secondary | ICD-10-CM | POA: Diagnosis not present

## 2014-07-04 DIAGNOSIS — H04123 Dry eye syndrome of bilateral lacrimal glands: Secondary | ICD-10-CM | POA: Diagnosis not present

## 2014-07-04 DIAGNOSIS — H02834 Dermatochalasis of left upper eyelid: Secondary | ICD-10-CM | POA: Diagnosis not present

## 2014-07-09 ENCOUNTER — Other Ambulatory Visit (HOSPITAL_COMMUNITY)
Admission: RE | Admit: 2014-07-09 | Discharge: 2014-07-09 | Disposition: A | Discharge status: Home / Self Care | Payer: Medicare Other | Source: Ambulatory Visit | Attending: Family Medicine | Admitting: Family Medicine

## 2014-07-09 ENCOUNTER — Other Ambulatory Visit: Payer: Self-pay | Admitting: Family Medicine

## 2014-07-09 DIAGNOSIS — Z23 Encounter for immunization: Secondary | ICD-10-CM | POA: Diagnosis not present

## 2014-07-09 DIAGNOSIS — E780 Pure hypercholesterolemia: Secondary | ICD-10-CM | POA: Diagnosis not present

## 2014-07-09 DIAGNOSIS — K219 Gastro-esophageal reflux disease without esophagitis: Secondary | ICD-10-CM | POA: Diagnosis not present

## 2014-07-09 DIAGNOSIS — Z124 Encounter for screening for malignant neoplasm of cervix: Secondary | ICD-10-CM | POA: Insufficient documentation

## 2014-07-09 DIAGNOSIS — Z1211 Encounter for screening for malignant neoplasm of colon: Secondary | ICD-10-CM | POA: Diagnosis not present

## 2014-07-09 DIAGNOSIS — Z0000 Encounter for general adult medical examination without abnormal findings: Secondary | ICD-10-CM | POA: Diagnosis not present

## 2014-07-09 DIAGNOSIS — E039 Hypothyroidism, unspecified: Secondary | ICD-10-CM | POA: Diagnosis not present

## 2014-07-11 LAB — CYTOLOGY - PAP

## 2014-07-21 DIAGNOSIS — R21 Rash and other nonspecific skin eruption: Secondary | ICD-10-CM | POA: Diagnosis not present

## 2014-07-25 DIAGNOSIS — M170 Bilateral primary osteoarthritis of knee: Secondary | ICD-10-CM | POA: Diagnosis not present

## 2014-07-25 DIAGNOSIS — M19071 Primary osteoarthritis, right ankle and foot: Secondary | ICD-10-CM | POA: Diagnosis not present

## 2014-07-25 DIAGNOSIS — M19041 Primary osteoarthritis, right hand: Secondary | ICD-10-CM | POA: Diagnosis not present

## 2014-07-25 DIAGNOSIS — M5137 Other intervertebral disc degeneration, lumbosacral region: Secondary | ICD-10-CM | POA: Diagnosis not present

## 2014-08-06 DIAGNOSIS — Z1211 Encounter for screening for malignant neoplasm of colon: Secondary | ICD-10-CM | POA: Diagnosis not present

## 2014-09-17 DIAGNOSIS — F319 Bipolar disorder, unspecified: Secondary | ICD-10-CM | POA: Diagnosis not present

## 2014-09-25 DIAGNOSIS — Z79899 Other long term (current) drug therapy: Secondary | ICD-10-CM | POA: Diagnosis not present

## 2014-09-25 DIAGNOSIS — F3132 Bipolar disorder, current episode depressed, moderate: Secondary | ICD-10-CM | POA: Diagnosis not present

## 2014-11-12 DIAGNOSIS — F319 Bipolar disorder, unspecified: Secondary | ICD-10-CM | POA: Diagnosis not present

## 2014-11-19 DIAGNOSIS — G4733 Obstructive sleep apnea (adult) (pediatric): Secondary | ICD-10-CM | POA: Diagnosis not present

## 2014-12-17 DIAGNOSIS — E039 Hypothyroidism, unspecified: Secondary | ICD-10-CM | POA: Diagnosis not present

## 2014-12-17 DIAGNOSIS — R5383 Other fatigue: Secondary | ICD-10-CM | POA: Diagnosis not present

## 2014-12-17 DIAGNOSIS — G4733 Obstructive sleep apnea (adult) (pediatric): Secondary | ICD-10-CM | POA: Diagnosis not present

## 2014-12-17 DIAGNOSIS — E782 Mixed hyperlipidemia: Secondary | ICD-10-CM | POA: Diagnosis not present

## 2014-12-17 DIAGNOSIS — Z6841 Body Mass Index (BMI) 40.0 and over, adult: Secondary | ICD-10-CM | POA: Diagnosis not present

## 2015-01-02 DIAGNOSIS — L720 Epidermal cyst: Secondary | ICD-10-CM | POA: Diagnosis not present

## 2015-01-02 DIAGNOSIS — N3000 Acute cystitis without hematuria: Secondary | ICD-10-CM | POA: Diagnosis not present

## 2015-01-02 DIAGNOSIS — R1310 Dysphagia, unspecified: Secondary | ICD-10-CM | POA: Diagnosis not present

## 2015-01-28 DIAGNOSIS — M170 Bilateral primary osteoarthritis of knee: Secondary | ICD-10-CM | POA: Diagnosis not present

## 2015-01-28 DIAGNOSIS — M8580 Other specified disorders of bone density and structure, unspecified site: Secondary | ICD-10-CM | POA: Diagnosis not present

## 2015-01-28 DIAGNOSIS — M19071 Primary osteoarthritis, right ankle and foot: Secondary | ICD-10-CM | POA: Diagnosis not present

## 2015-01-28 DIAGNOSIS — M5137 Other intervertebral disc degeneration, lumbosacral region: Secondary | ICD-10-CM | POA: Diagnosis not present

## 2015-02-05 DIAGNOSIS — Z23 Encounter for immunization: Secondary | ICD-10-CM | POA: Diagnosis not present

## 2015-02-19 DIAGNOSIS — M25531 Pain in right wrist: Secondary | ICD-10-CM | POA: Diagnosis not present

## 2015-02-19 DIAGNOSIS — N3281 Overactive bladder: Secondary | ICD-10-CM | POA: Diagnosis not present

## 2015-02-19 DIAGNOSIS — J069 Acute upper respiratory infection, unspecified: Secondary | ICD-10-CM | POA: Diagnosis not present

## 2015-03-05 DIAGNOSIS — E039 Hypothyroidism, unspecified: Secondary | ICD-10-CM | POA: Diagnosis not present

## 2015-03-05 DIAGNOSIS — J329 Chronic sinusitis, unspecified: Secondary | ICD-10-CM | POA: Diagnosis not present

## 2015-03-18 ENCOUNTER — Other Ambulatory Visit: Payer: Self-pay

## 2015-03-18 DIAGNOSIS — Z1231 Encounter for screening mammogram for malignant neoplasm of breast: Secondary | ICD-10-CM

## 2015-03-24 DIAGNOSIS — D485 Neoplasm of uncertain behavior of skin: Secondary | ICD-10-CM | POA: Diagnosis not present

## 2015-03-24 DIAGNOSIS — J309 Allergic rhinitis, unspecified: Secondary | ICD-10-CM | POA: Diagnosis not present

## 2015-03-24 DIAGNOSIS — N3281 Overactive bladder: Secondary | ICD-10-CM | POA: Diagnosis not present

## 2015-03-24 DIAGNOSIS — D489 Neoplasm of uncertain behavior, unspecified: Secondary | ICD-10-CM | POA: Diagnosis not present

## 2015-03-25 DIAGNOSIS — F319 Bipolar disorder, unspecified: Secondary | ICD-10-CM | POA: Diagnosis not present

## 2015-04-07 ENCOUNTER — Ambulatory Visit
Admission: RE | Admit: 2015-04-07 | Discharge: 2015-04-07 | Disposition: A | Discharge status: Home / Self Care | Payer: Medicare Other | Source: Ambulatory Visit

## 2015-04-07 DIAGNOSIS — Z1231 Encounter for screening mammogram for malignant neoplasm of breast: Secondary | ICD-10-CM

## 2015-04-08 DIAGNOSIS — R05 Cough: Secondary | ICD-10-CM | POA: Diagnosis not present

## 2015-04-08 DIAGNOSIS — N3281 Overactive bladder: Secondary | ICD-10-CM | POA: Diagnosis not present

## 2015-04-09 ENCOUNTER — Other Ambulatory Visit: Payer: Self-pay | Admitting: Family Medicine

## 2015-04-09 DIAGNOSIS — R928 Other abnormal and inconclusive findings on diagnostic imaging of breast: Secondary | ICD-10-CM

## 2015-04-11 ENCOUNTER — Ambulatory Visit
Admission: RE | Admit: 2015-04-11 | Discharge: 2015-04-11 | Disposition: A | Discharge status: Home / Self Care | Payer: Medicare Other | Source: Ambulatory Visit | Attending: Family Medicine | Admitting: Family Medicine

## 2015-04-11 DIAGNOSIS — R928 Other abnormal and inconclusive findings on diagnostic imaging of breast: Secondary | ICD-10-CM

## 2015-04-14 ENCOUNTER — Ambulatory Visit
Admission: RE | Admit: 2015-04-14 | Discharge: 2015-04-14 | Disposition: A | Discharge status: Home / Self Care | Payer: Medicare Other | Source: Ambulatory Visit | Attending: Family Medicine | Admitting: Family Medicine

## 2015-04-14 ENCOUNTER — Other Ambulatory Visit: Payer: Self-pay | Admitting: Family Medicine

## 2015-04-14 DIAGNOSIS — F319 Bipolar disorder, unspecified: Secondary | ICD-10-CM | POA: Diagnosis not present

## 2015-04-14 DIAGNOSIS — R05 Cough: Secondary | ICD-10-CM

## 2015-04-14 DIAGNOSIS — R059 Cough, unspecified: Secondary | ICD-10-CM

## 2015-04-21 DIAGNOSIS — R05 Cough: Secondary | ICD-10-CM | POA: Diagnosis not present

## 2015-04-21 DIAGNOSIS — S139XXA Sprain of joints and ligaments of unspecified parts of neck, initial encounter: Secondary | ICD-10-CM | POA: Diagnosis not present

## 2015-05-04 DIAGNOSIS — G4730 Sleep apnea, unspecified: Secondary | ICD-10-CM

## 2015-05-04 HISTORY — DX: Sleep apnea, unspecified: G47.30

## 2015-05-21 DIAGNOSIS — Z79899 Other long term (current) drug therapy: Secondary | ICD-10-CM | POA: Diagnosis not present

## 2015-07-09 DIAGNOSIS — E039 Hypothyroidism, unspecified: Secondary | ICD-10-CM | POA: Diagnosis not present

## 2015-07-09 DIAGNOSIS — E7800 Pure hypercholesterolemia, unspecified: Secondary | ICD-10-CM | POA: Diagnosis not present

## 2015-07-09 DIAGNOSIS — N3281 Overactive bladder: Secondary | ICD-10-CM | POA: Diagnosis not present

## 2015-07-09 DIAGNOSIS — J069 Acute upper respiratory infection, unspecified: Secondary | ICD-10-CM | POA: Diagnosis not present

## 2015-07-09 DIAGNOSIS — K219 Gastro-esophageal reflux disease without esophagitis: Secondary | ICD-10-CM | POA: Diagnosis not present

## 2015-07-10 DIAGNOSIS — H04123 Dry eye syndrome of bilateral lacrimal glands: Secondary | ICD-10-CM | POA: Diagnosis not present

## 2015-07-10 DIAGNOSIS — H52223 Regular astigmatism, bilateral: Secondary | ICD-10-CM | POA: Diagnosis not present

## 2015-07-10 DIAGNOSIS — H353131 Nonexudative age-related macular degeneration, bilateral, early dry stage: Secondary | ICD-10-CM | POA: Diagnosis not present

## 2015-07-10 DIAGNOSIS — H524 Presbyopia: Secondary | ICD-10-CM | POA: Diagnosis not present

## 2015-07-14 DIAGNOSIS — H93293 Other abnormal auditory perceptions, bilateral: Secondary | ICD-10-CM | POA: Diagnosis not present

## 2015-07-14 DIAGNOSIS — Z0110 Encounter for examination of ears and hearing without abnormal findings: Secondary | ICD-10-CM | POA: Diagnosis not present

## 2015-07-14 DIAGNOSIS — H10012 Acute follicular conjunctivitis, left eye: Secondary | ICD-10-CM | POA: Diagnosis not present

## 2015-07-16 DIAGNOSIS — N3946 Mixed incontinence: Secondary | ICD-10-CM | POA: Diagnosis not present

## 2015-07-16 DIAGNOSIS — R350 Frequency of micturition: Secondary | ICD-10-CM | POA: Diagnosis not present

## 2015-07-16 DIAGNOSIS — N816 Rectocele: Secondary | ICD-10-CM | POA: Diagnosis not present

## 2015-07-16 DIAGNOSIS — R351 Nocturia: Secondary | ICD-10-CM | POA: Diagnosis not present

## 2015-07-16 DIAGNOSIS — H10013 Acute follicular conjunctivitis, bilateral: Secondary | ICD-10-CM | POA: Diagnosis not present

## 2015-07-16 DIAGNOSIS — Z0000 Encounter for general adult medical examination without abnormal findings: Secondary | ICD-10-CM | POA: Diagnosis not present

## 2015-07-25 DIAGNOSIS — J309 Allergic rhinitis, unspecified: Secondary | ICD-10-CM | POA: Diagnosis not present

## 2015-08-05 DIAGNOSIS — H10013 Acute follicular conjunctivitis, bilateral: Secondary | ICD-10-CM | POA: Diagnosis not present

## 2015-08-14 DIAGNOSIS — M5137 Other intervertebral disc degeneration, lumbosacral region: Secondary | ICD-10-CM | POA: Diagnosis not present

## 2015-08-14 DIAGNOSIS — M19241 Secondary osteoarthritis, right hand: Secondary | ICD-10-CM | POA: Diagnosis not present

## 2015-08-14 DIAGNOSIS — M7071 Other bursitis of hip, right hip: Secondary | ICD-10-CM | POA: Diagnosis not present

## 2015-08-19 DIAGNOSIS — F319 Bipolar disorder, unspecified: Secondary | ICD-10-CM | POA: Diagnosis not present

## 2015-08-21 DIAGNOSIS — H52223 Regular astigmatism, bilateral: Secondary | ICD-10-CM | POA: Diagnosis not present

## 2015-08-21 DIAGNOSIS — H04123 Dry eye syndrome of bilateral lacrimal glands: Secondary | ICD-10-CM | POA: Diagnosis not present

## 2015-08-21 DIAGNOSIS — H353131 Nonexudative age-related macular degeneration, bilateral, early dry stage: Secondary | ICD-10-CM | POA: Diagnosis not present

## 2015-08-21 DIAGNOSIS — H524 Presbyopia: Secondary | ICD-10-CM | POA: Diagnosis not present

## 2015-08-21 DIAGNOSIS — H10013 Acute follicular conjunctivitis, bilateral: Secondary | ICD-10-CM | POA: Diagnosis not present

## 2015-09-09 DIAGNOSIS — N3946 Mixed incontinence: Secondary | ICD-10-CM | POA: Diagnosis not present

## 2015-09-09 DIAGNOSIS — R351 Nocturia: Secondary | ICD-10-CM | POA: Diagnosis not present

## 2015-09-09 DIAGNOSIS — R350 Frequency of micturition: Secondary | ICD-10-CM | POA: Diagnosis not present

## 2015-09-09 DIAGNOSIS — Z0000 Encounter for general adult medical examination without abnormal findings: Secondary | ICD-10-CM | POA: Diagnosis not present

## 2015-09-17 DIAGNOSIS — N3946 Mixed incontinence: Secondary | ICD-10-CM | POA: Diagnosis not present

## 2015-09-17 DIAGNOSIS — R350 Frequency of micturition: Secondary | ICD-10-CM | POA: Diagnosis not present

## 2015-09-23 DIAGNOSIS — H04123 Dry eye syndrome of bilateral lacrimal glands: Secondary | ICD-10-CM | POA: Diagnosis not present

## 2015-10-23 DIAGNOSIS — M79643 Pain in unspecified hand: Secondary | ICD-10-CM | POA: Diagnosis not present

## 2015-10-23 DIAGNOSIS — J069 Acute upper respiratory infection, unspecified: Secondary | ICD-10-CM | POA: Diagnosis not present

## 2015-10-23 DIAGNOSIS — L821 Other seborrheic keratosis: Secondary | ICD-10-CM | POA: Diagnosis not present

## 2015-10-28 DIAGNOSIS — R350 Frequency of micturition: Secondary | ICD-10-CM | POA: Diagnosis not present

## 2015-10-28 DIAGNOSIS — N3946 Mixed incontinence: Secondary | ICD-10-CM | POA: Diagnosis not present

## 2015-10-29 DIAGNOSIS — G4733 Obstructive sleep apnea (adult) (pediatric): Secondary | ICD-10-CM | POA: Diagnosis not present

## 2015-10-30 DIAGNOSIS — F319 Bipolar disorder, unspecified: Secondary | ICD-10-CM | POA: Diagnosis not present

## 2015-11-12 DIAGNOSIS — H52223 Regular astigmatism, bilateral: Secondary | ICD-10-CM | POA: Diagnosis not present

## 2015-11-12 DIAGNOSIS — H353131 Nonexudative age-related macular degeneration, bilateral, early dry stage: Secondary | ICD-10-CM | POA: Diagnosis not present

## 2015-11-12 DIAGNOSIS — H04123 Dry eye syndrome of bilateral lacrimal glands: Secondary | ICD-10-CM | POA: Diagnosis not present

## 2015-11-12 DIAGNOSIS — H524 Presbyopia: Secondary | ICD-10-CM | POA: Diagnosis not present

## 2015-11-12 DIAGNOSIS — H02055 Trichiasis without entropian left lower eyelid: Secondary | ICD-10-CM | POA: Diagnosis not present

## 2015-12-10 DIAGNOSIS — H04123 Dry eye syndrome of bilateral lacrimal glands: Secondary | ICD-10-CM | POA: Diagnosis not present

## 2015-12-25 DIAGNOSIS — M62838 Other muscle spasm: Secondary | ICD-10-CM | POA: Diagnosis not present

## 2015-12-25 DIAGNOSIS — R350 Frequency of micturition: Secondary | ICD-10-CM | POA: Diagnosis not present

## 2015-12-25 DIAGNOSIS — F3175 Bipolar disorder, in partial remission, most recent episode depressed: Secondary | ICD-10-CM | POA: Diagnosis not present

## 2015-12-25 DIAGNOSIS — M6281 Muscle weakness (generalized): Secondary | ICD-10-CM | POA: Diagnosis not present

## 2015-12-25 DIAGNOSIS — M545 Low back pain: Secondary | ICD-10-CM | POA: Diagnosis not present

## 2015-12-25 DIAGNOSIS — M25552 Pain in left hip: Secondary | ICD-10-CM | POA: Diagnosis not present

## 2015-12-30 DIAGNOSIS — E039 Hypothyroidism, unspecified: Secondary | ICD-10-CM | POA: Diagnosis not present

## 2015-12-30 DIAGNOSIS — R609 Edema, unspecified: Secondary | ICD-10-CM | POA: Diagnosis not present

## 2015-12-30 DIAGNOSIS — N3000 Acute cystitis without hematuria: Secondary | ICD-10-CM | POA: Diagnosis not present

## 2015-12-30 DIAGNOSIS — L821 Other seborrheic keratosis: Secondary | ICD-10-CM | POA: Diagnosis not present

## 2015-12-30 DIAGNOSIS — N3281 Overactive bladder: Secondary | ICD-10-CM | POA: Diagnosis not present

## 2015-12-31 DIAGNOSIS — M62838 Other muscle spasm: Secondary | ICD-10-CM | POA: Diagnosis not present

## 2015-12-31 DIAGNOSIS — M6281 Muscle weakness (generalized): Secondary | ICD-10-CM | POA: Diagnosis not present

## 2015-12-31 DIAGNOSIS — M25552 Pain in left hip: Secondary | ICD-10-CM | POA: Diagnosis not present

## 2015-12-31 DIAGNOSIS — M545 Low back pain: Secondary | ICD-10-CM | POA: Diagnosis not present

## 2016-01-09 DIAGNOSIS — N9489 Other specified conditions associated with female genital organs and menstrual cycle: Secondary | ICD-10-CM | POA: Diagnosis not present

## 2016-01-09 DIAGNOSIS — N3281 Overactive bladder: Secondary | ICD-10-CM | POA: Diagnosis not present

## 2016-01-09 DIAGNOSIS — R351 Nocturia: Secondary | ICD-10-CM | POA: Diagnosis not present

## 2016-01-09 DIAGNOSIS — N816 Rectocele: Secondary | ICD-10-CM | POA: Diagnosis not present

## 2016-01-12 DIAGNOSIS — R601 Generalized edema: Secondary | ICD-10-CM | POA: Diagnosis not present

## 2016-01-12 DIAGNOSIS — M6281 Muscle weakness (generalized): Secondary | ICD-10-CM | POA: Diagnosis not present

## 2016-01-12 DIAGNOSIS — M62838 Other muscle spasm: Secondary | ICD-10-CM | POA: Diagnosis not present

## 2016-01-12 DIAGNOSIS — M79671 Pain in right foot: Secondary | ICD-10-CM | POA: Diagnosis not present

## 2016-01-15 DIAGNOSIS — B070 Plantar wart: Secondary | ICD-10-CM | POA: Diagnosis not present

## 2016-01-15 DIAGNOSIS — R5383 Other fatigue: Secondary | ICD-10-CM | POA: Diagnosis not present

## 2016-01-15 DIAGNOSIS — M79671 Pain in right foot: Secondary | ICD-10-CM | POA: Diagnosis not present

## 2016-01-16 DIAGNOSIS — H02831 Dermatochalasis of right upper eyelid: Secondary | ICD-10-CM | POA: Diagnosis not present

## 2016-01-16 DIAGNOSIS — H02834 Dermatochalasis of left upper eyelid: Secondary | ICD-10-CM | POA: Diagnosis not present

## 2016-01-16 DIAGNOSIS — H3530 Unspecified macular degeneration: Secondary | ICD-10-CM | POA: Diagnosis not present

## 2016-01-16 DIAGNOSIS — H16223 Keratoconjunctivitis sicca, not specified as Sjogren's, bilateral: Secondary | ICD-10-CM | POA: Diagnosis not present

## 2016-01-16 DIAGNOSIS — Z961 Presence of intraocular lens: Secondary | ICD-10-CM | POA: Diagnosis not present

## 2016-01-16 DIAGNOSIS — H0289 Other specified disorders of eyelid: Secondary | ICD-10-CM | POA: Diagnosis not present

## 2016-01-20 DIAGNOSIS — H524 Presbyopia: Secondary | ICD-10-CM | POA: Diagnosis not present

## 2016-01-20 DIAGNOSIS — H52223 Regular astigmatism, bilateral: Secondary | ICD-10-CM | POA: Diagnosis not present

## 2016-01-20 DIAGNOSIS — H04123 Dry eye syndrome of bilateral lacrimal glands: Secondary | ICD-10-CM | POA: Diagnosis not present

## 2016-01-20 DIAGNOSIS — H353131 Nonexudative age-related macular degeneration, bilateral, early dry stage: Secondary | ICD-10-CM | POA: Diagnosis not present

## 2016-01-21 DIAGNOSIS — M6281 Muscle weakness (generalized): Secondary | ICD-10-CM | POA: Diagnosis not present

## 2016-01-21 DIAGNOSIS — M79671 Pain in right foot: Secondary | ICD-10-CM | POA: Diagnosis not present

## 2016-01-21 DIAGNOSIS — R601 Generalized edema: Secondary | ICD-10-CM | POA: Diagnosis not present

## 2016-01-21 DIAGNOSIS — M25552 Pain in left hip: Secondary | ICD-10-CM | POA: Diagnosis not present

## 2016-01-21 DIAGNOSIS — M62838 Other muscle spasm: Secondary | ICD-10-CM | POA: Diagnosis not present

## 2016-01-28 DIAGNOSIS — M545 Low back pain: Secondary | ICD-10-CM | POA: Diagnosis not present

## 2016-01-28 DIAGNOSIS — M25552 Pain in left hip: Secondary | ICD-10-CM | POA: Diagnosis not present

## 2016-01-28 DIAGNOSIS — N3946 Mixed incontinence: Secondary | ICD-10-CM | POA: Diagnosis not present

## 2016-01-28 DIAGNOSIS — M6281 Muscle weakness (generalized): Secondary | ICD-10-CM | POA: Diagnosis not present

## 2016-01-28 DIAGNOSIS — M62838 Other muscle spasm: Secondary | ICD-10-CM | POA: Diagnosis not present

## 2016-01-29 DIAGNOSIS — F319 Bipolar disorder, unspecified: Secondary | ICD-10-CM | POA: Diagnosis not present

## 2016-02-04 ENCOUNTER — Other Ambulatory Visit: Payer: Self-pay | Admitting: Family Medicine

## 2016-02-04 DIAGNOSIS — Z23 Encounter for immunization: Secondary | ICD-10-CM | POA: Diagnosis not present

## 2016-02-04 DIAGNOSIS — L820 Inflamed seborrheic keratosis: Secondary | ICD-10-CM | POA: Diagnosis not present

## 2016-02-04 DIAGNOSIS — B079 Viral wart, unspecified: Secondary | ICD-10-CM | POA: Diagnosis not present

## 2016-02-04 DIAGNOSIS — R609 Edema, unspecified: Secondary | ICD-10-CM | POA: Diagnosis not present

## 2016-02-10 ENCOUNTER — Ambulatory Visit: Payer: Medicare Other | Admitting: Rheumatology

## 2016-02-10 DIAGNOSIS — M19042 Primary osteoarthritis, left hand: Secondary | ICD-10-CM | POA: Diagnosis not present

## 2016-02-10 DIAGNOSIS — M1712 Unilateral primary osteoarthritis, left knee: Secondary | ICD-10-CM | POA: Diagnosis not present

## 2016-02-10 DIAGNOSIS — G4730 Sleep apnea, unspecified: Secondary | ICD-10-CM | POA: Diagnosis not present

## 2016-02-10 DIAGNOSIS — M19041 Primary osteoarthritis, right hand: Secondary | ICD-10-CM | POA: Diagnosis not present

## 2016-02-10 DIAGNOSIS — R351 Nocturia: Secondary | ICD-10-CM | POA: Diagnosis not present

## 2016-02-10 DIAGNOSIS — G4733 Obstructive sleep apnea (adult) (pediatric): Secondary | ICD-10-CM | POA: Diagnosis not present

## 2016-02-10 DIAGNOSIS — M545 Low back pain: Secondary | ICD-10-CM | POA: Diagnosis not present

## 2016-02-10 DIAGNOSIS — M1711 Unilateral primary osteoarthritis, right knee: Secondary | ICD-10-CM | POA: Diagnosis not present

## 2016-02-10 DIAGNOSIS — M2550 Pain in unspecified joint: Secondary | ICD-10-CM | POA: Diagnosis not present

## 2016-02-24 DIAGNOSIS — N765 Ulceration of vagina: Secondary | ICD-10-CM | POA: Diagnosis not present

## 2016-02-24 DIAGNOSIS — B079 Viral wart, unspecified: Secondary | ICD-10-CM | POA: Diagnosis not present

## 2016-02-24 DIAGNOSIS — N760 Acute vaginitis: Secondary | ICD-10-CM | POA: Diagnosis not present

## 2016-02-26 DIAGNOSIS — G4733 Obstructive sleep apnea (adult) (pediatric): Secondary | ICD-10-CM | POA: Diagnosis not present

## 2016-03-07 DIAGNOSIS — L03115 Cellulitis of right lower limb: Secondary | ICD-10-CM | POA: Diagnosis not present

## 2016-03-07 DIAGNOSIS — B070 Plantar wart: Secondary | ICD-10-CM | POA: Diagnosis not present

## 2016-03-10 DIAGNOSIS — G4733 Obstructive sleep apnea (adult) (pediatric): Secondary | ICD-10-CM | POA: Diagnosis not present

## 2016-03-10 DIAGNOSIS — Z9989 Dependence on other enabling machines and devices: Secondary | ICD-10-CM | POA: Diagnosis not present

## 2016-03-10 DIAGNOSIS — G4730 Sleep apnea, unspecified: Secondary | ICD-10-CM | POA: Diagnosis not present

## 2016-03-10 DIAGNOSIS — R351 Nocturia: Secondary | ICD-10-CM | POA: Diagnosis not present

## 2016-03-10 DIAGNOSIS — E039 Hypothyroidism, unspecified: Secondary | ICD-10-CM | POA: Diagnosis not present

## 2016-03-10 DIAGNOSIS — E7800 Pure hypercholesterolemia, unspecified: Secondary | ICD-10-CM | POA: Diagnosis not present

## 2016-03-17 DIAGNOSIS — F319 Bipolar disorder, unspecified: Secondary | ICD-10-CM | POA: Diagnosis not present

## 2016-03-19 DIAGNOSIS — H43813 Vitreous degeneration, bilateral: Secondary | ICD-10-CM | POA: Diagnosis not present

## 2016-03-19 DIAGNOSIS — H353132 Nonexudative age-related macular degeneration, bilateral, intermediate dry stage: Secondary | ICD-10-CM | POA: Diagnosis not present

## 2016-03-29 DIAGNOSIS — R351 Nocturia: Secondary | ICD-10-CM | POA: Diagnosis not present

## 2016-03-29 DIAGNOSIS — G4733 Obstructive sleep apnea (adult) (pediatric): Secondary | ICD-10-CM | POA: Diagnosis not present

## 2016-03-29 DIAGNOSIS — N816 Rectocele: Secondary | ICD-10-CM | POA: Diagnosis not present

## 2016-03-29 DIAGNOSIS — N3281 Overactive bladder: Secondary | ICD-10-CM | POA: Diagnosis not present

## 2016-03-29 DIAGNOSIS — N9489 Other specified conditions associated with female genital organs and menstrual cycle: Secondary | ICD-10-CM | POA: Diagnosis not present

## 2016-03-29 DIAGNOSIS — R32 Unspecified urinary incontinence: Secondary | ICD-10-CM | POA: Diagnosis not present

## 2016-03-29 DIAGNOSIS — R350 Frequency of micturition: Secondary | ICD-10-CM | POA: Diagnosis not present

## 2016-03-30 ENCOUNTER — Telehealth: Payer: Self-pay | Admitting: Rheumatology

## 2016-03-30 DIAGNOSIS — Z79899 Other long term (current) drug therapy: Secondary | ICD-10-CM

## 2016-03-30 DIAGNOSIS — M545 Low back pain: Secondary | ICD-10-CM | POA: Diagnosis not present

## 2016-03-30 DIAGNOSIS — B070 Plantar wart: Secondary | ICD-10-CM | POA: Diagnosis not present

## 2016-03-30 NOTE — Telephone Encounter (Signed)
Patient would like generic for Celebrex sent to [facility] on [address]. Patient states she would like that sent today please.

## 2016-03-30 NOTE — Telephone Encounter (Signed)
Last visit was 02/10/16 She has not had CBC CMP since Jan  This was left off her lab order in Oct  She will need these.  Ok to refill Celebrex one month supply ?

## 2016-03-31 DIAGNOSIS — Z9911 Dependence on respirator [ventilator] status: Secondary | ICD-10-CM | POA: Diagnosis not present

## 2016-03-31 DIAGNOSIS — E039 Hypothyroidism, unspecified: Secondary | ICD-10-CM | POA: Diagnosis not present

## 2016-03-31 DIAGNOSIS — Z6841 Body Mass Index (BMI) 40.0 and over, adult: Secondary | ICD-10-CM | POA: Diagnosis not present

## 2016-03-31 DIAGNOSIS — Z79899 Other long term (current) drug therapy: Secondary | ICD-10-CM | POA: Diagnosis not present

## 2016-03-31 DIAGNOSIS — G4733 Obstructive sleep apnea (adult) (pediatric): Secondary | ICD-10-CM | POA: Diagnosis not present

## 2016-03-31 DIAGNOSIS — Z9989 Dependence on other enabling machines and devices: Secondary | ICD-10-CM | POA: Diagnosis not present

## 2016-03-31 DIAGNOSIS — E7800 Pure hypercholesterolemia, unspecified: Secondary | ICD-10-CM | POA: Diagnosis not present

## 2016-03-31 MED ORDER — CELECOXIB 200 MG PO CAPS: 200.0000 mg | ORAL_CAPSULE | Freq: Every day | ORAL | 0 refills | Status: DC

## 2016-03-31 NOTE — Telephone Encounter (Signed)
Left message for her to call me back. 

## 2016-03-31 NOTE — Telephone Encounter (Signed)
Yes, ok to do one month suppply only   Labs do need to be updated please.

## 2016-03-31 NOTE — Addendum Note (Signed)
Addended byCandice Camp on: 03/31/2016 01:42 PM   Modules accepted: Orders

## 2016-04-01 ENCOUNTER — Telehealth: Payer: Self-pay | Admitting: Rheumatology

## 2016-04-01 NOTE — Telephone Encounter (Signed)
Patient returned Amy's phone call and was asking what lab work she needs done and when it needs to be done by. Per patient, if she does not answer please leave detailed message.

## 2016-04-01 NOTE — Telephone Encounter (Signed)
She needs a CBC and CMP before the one month supply of Celebrex runs out. I have left message to advise.

## 2016-04-08 DIAGNOSIS — M722 Plantar fascial fibromatosis: Secondary | ICD-10-CM | POA: Diagnosis not present

## 2016-04-08 DIAGNOSIS — D2372 Other benign neoplasm of skin of left lower limb, including hip: Secondary | ICD-10-CM | POA: Diagnosis not present

## 2016-04-08 DIAGNOSIS — M21961 Unspecified acquired deformity of right lower leg: Secondary | ICD-10-CM | POA: Diagnosis not present

## 2016-04-08 DIAGNOSIS — D2371 Other benign neoplasm of skin of right lower limb, including hip: Secondary | ICD-10-CM | POA: Diagnosis not present

## 2016-04-12 DIAGNOSIS — N3281 Overactive bladder: Secondary | ICD-10-CM | POA: Diagnosis not present

## 2016-04-12 DIAGNOSIS — E7800 Pure hypercholesterolemia, unspecified: Secondary | ICD-10-CM | POA: Diagnosis not present

## 2016-04-12 DIAGNOSIS — Z6841 Body Mass Index (BMI) 40.0 and over, adult: Secondary | ICD-10-CM | POA: Diagnosis not present

## 2016-04-12 DIAGNOSIS — M545 Low back pain: Secondary | ICD-10-CM | POA: Diagnosis not present

## 2016-04-12 DIAGNOSIS — E039 Hypothyroidism, unspecified: Secondary | ICD-10-CM | POA: Diagnosis not present

## 2016-04-14 ENCOUNTER — Other Ambulatory Visit: Payer: Self-pay | Admitting: *Deleted

## 2016-04-14 DIAGNOSIS — Z79899 Other long term (current) drug therapy: Secondary | ICD-10-CM | POA: Diagnosis not present

## 2016-04-14 LAB — CBC WITH DIFFERENTIAL/PLATELET
Basophils Absolute: 0 cells/uL (ref 0–200)
Basophils Relative: 0 %
Eosinophils Absolute: 240 cells/uL (ref 15–500)
Eosinophils Relative: 2 %
HCT: 37.9 % (ref 35.0–45.0)
Hemoglobin: 12.1 g/dL (ref 11.7–15.5)
Lymphocytes Relative: 20 %
Lymphs Abs: 2400 cells/uL (ref 850–3900)
MCH: 30 pg (ref 27.0–33.0)
MCHC: 31.9 g/dL — ABNORMAL LOW (ref 32.0–36.0)
MCV: 94 fL (ref 80.0–100.0)
MPV: 9.5 fL (ref 7.5–12.5)
Monocytes Absolute: 1320 cells/uL — ABNORMAL HIGH (ref 200–950)
Monocytes Relative: 11 %
Neutro Abs: 8040 cells/uL — ABNORMAL HIGH (ref 1500–7800)
Neutrophils Relative %: 67 %
Platelets: 306 10*3/uL (ref 140–400)
RBC: 4.03 MIL/uL (ref 3.80–5.10)
RDW: 13.5 % (ref 11.0–15.0)
WBC: 12 10*3/uL — ABNORMAL HIGH (ref 3.8–10.8)

## 2016-04-15 DIAGNOSIS — F319 Bipolar disorder, unspecified: Secondary | ICD-10-CM | POA: Diagnosis not present

## 2016-04-15 LAB — COMPLETE METABOLIC PANEL WITH GFR
ALT: 16 U/L (ref 6–29)
AST: 21 U/L (ref 10–35)
Albumin: 3.9 g/dL (ref 3.6–5.1)
Alkaline Phosphatase: 91 U/L (ref 33–130)
BUN: 14 mg/dL (ref 7–25)
CO2: 22 mmol/L (ref 20–31)
Calcium: 9.7 mg/dL (ref 8.6–10.4)
Chloride: 103 mmol/L (ref 98–110)
Creat: 1.01 mg/dL — ABNORMAL HIGH (ref 0.50–0.99)
GFR, Est African American: 67 mL/min (ref >=60)
GFR, Est Non African American: 58 mL/min — ABNORMAL LOW (ref >=60)
Glucose, Bld: 88 mg/dL (ref 65–99)
Potassium: 5.1 mmol/L (ref 3.5–5.3)
Sodium: 137 mmol/L (ref 135–146)
Total Bilirubin: 0.2 mg/dL (ref 0.2–1.2)
Total Protein: 6.6 g/dL (ref 6.1–8.1)

## 2016-04-15 NOTE — Progress Notes (Signed)
Tell patient1: CMP with GFR is normal except for creatinine mildly elevated at 1.01. An GFR slightly low at 58. No change in treatment. We will watch her kidney function.Patient should be advised to drink adequate amount of water#2: CBC with differential is normal except white count neutrophils and monocytes are mildly elevated.This is not worrisome but if we can call and find out if the person currently has any kind of infection going on to correlate this increase. If she has a cough or cold currently this would explain her white count.Please forward this lab to her PCPNote: We treat her for osteoarthritis etc. and we did not have her on any immunosuppressant.There is no change in treatment needed from our side.She should talk to her PCP regarding her elevated white count if indicated. They may want to do all follow-up blood work if she has any fever cough cold etc. That will be up to her PCP T

## 2016-04-20 DIAGNOSIS — R262 Difficulty in walking, not elsewhere classified: Secondary | ICD-10-CM | POA: Diagnosis not present

## 2016-04-20 DIAGNOSIS — M545 Low back pain: Secondary | ICD-10-CM | POA: Diagnosis not present

## 2016-04-20 DIAGNOSIS — M6281 Muscle weakness (generalized): Secondary | ICD-10-CM | POA: Diagnosis not present

## 2016-05-05 DIAGNOSIS — J069 Acute upper respiratory infection, unspecified: Secondary | ICD-10-CM | POA: Diagnosis not present

## 2016-05-05 DIAGNOSIS — B070 Plantar wart: Secondary | ICD-10-CM | POA: Diagnosis not present

## 2016-05-05 DIAGNOSIS — N3281 Overactive bladder: Secondary | ICD-10-CM | POA: Diagnosis not present

## 2016-05-12 DIAGNOSIS — D2372 Other benign neoplasm of skin of left lower limb, including hip: Secondary | ICD-10-CM | POA: Diagnosis not present

## 2016-05-12 DIAGNOSIS — M21961 Unspecified acquired deformity of right lower leg: Secondary | ICD-10-CM | POA: Diagnosis not present

## 2016-05-12 DIAGNOSIS — D2371 Other benign neoplasm of skin of right lower limb, including hip: Secondary | ICD-10-CM | POA: Diagnosis not present

## 2016-05-12 DIAGNOSIS — M722 Plantar fascial fibromatosis: Secondary | ICD-10-CM | POA: Diagnosis not present

## 2016-05-14 DIAGNOSIS — N3281 Overactive bladder: Secondary | ICD-10-CM | POA: Diagnosis not present

## 2016-05-14 DIAGNOSIS — N816 Rectocele: Secondary | ICD-10-CM | POA: Diagnosis not present

## 2016-05-14 DIAGNOSIS — N952 Postmenopausal atrophic vaginitis: Secondary | ICD-10-CM | POA: Diagnosis not present

## 2016-05-14 DIAGNOSIS — R351 Nocturia: Secondary | ICD-10-CM | POA: Diagnosis not present

## 2016-05-20 ENCOUNTER — Other Ambulatory Visit: Payer: Self-pay | Admitting: Rheumatology

## 2016-05-21 NOTE — Telephone Encounter (Signed)
Patient has had labs in Dec which indicate sl. Elevated Creat and low GFR Do you want her to continue Celebrex? Last visit 02/10/16 Next visit not scheduled message sent to make appt

## 2016-05-26 ENCOUNTER — Telehealth: Payer: Self-pay | Admitting: Pharmacist

## 2016-05-26 NOTE — Telephone Encounter (Signed)
Received a letter from [facility] regarding adverse drug interaction between celecoxib and lithium.  Reviewed patient's chart and noted she has been on this combination for an extended period of time.  She has previously been counseled on the interaction and reported that her doctor checks her lithium level.    I called patient to discuss the interaction and to ensure her lithium level is still being monitored.  Patient confirms she has been taking lithium and celecoxib 200 mg daily together for years.  Patient confirms her lithium level is being monitoring periodically and reports her level has been okay.  Advised her to continue to have the level monitored and to let the doctor prescribing lithium know if she stops taking celecoxib as they may have to adjust her lithium dose.  Patient voiced understanding.  She asked question about interaction between lithium and acetaminophen.  I advised patient there is no interaction between these medications.  Reviewed with patient the maximum recommended dose of acetaminophen is 3000 mg daily.  Patient denied any further questions regarding her medications.    Elisabeth Most, Pharm.D., BCPS, CPP Clinical Pharmacist Pager: [phone] Phone: [phone] 05/26/2016 3:40 PM

## 2016-06-03 ENCOUNTER — Encounter: Payer: Self-pay | Admitting: Rheumatology

## 2016-06-03 ENCOUNTER — Ambulatory Visit (INDEPENDENT_AMBULATORY_CARE_PROVIDER_SITE_OTHER): Payer: Medicare Other | Admitting: Rheumatology

## 2016-06-03 VITALS — BP 150/86 | HR 88 | Resp 22 | Ht 62.0 in | Wt 295.0 lb

## 2016-06-03 DIAGNOSIS — G8929 Other chronic pain: Secondary | ICD-10-CM

## 2016-06-03 DIAGNOSIS — M545 Low back pain: Secondary | ICD-10-CM

## 2016-06-03 DIAGNOSIS — M19079 Primary osteoarthritis, unspecified ankle and foot: Secondary | ICD-10-CM | POA: Diagnosis not present

## 2016-06-03 DIAGNOSIS — M19042 Primary osteoarthritis, left hand: Secondary | ICD-10-CM

## 2016-06-03 DIAGNOSIS — M170 Bilateral primary osteoarthritis of knee: Secondary | ICD-10-CM

## 2016-06-03 DIAGNOSIS — M5136 Other intervertebral disc degeneration, lumbar region: Secondary | ICD-10-CM | POA: Diagnosis not present

## 2016-06-03 DIAGNOSIS — M19041 Primary osteoarthritis, right hand: Secondary | ICD-10-CM

## 2016-06-03 MED ORDER — BACLOFEN 10 MG PO TABS: 5.0000 mg | ORAL_TABLET | Freq: Three times a day (TID) | ORAL | 5 refills | Status: DC

## 2016-06-03 NOTE — Progress Notes (Signed)
Office Visit Note  Patient: Jessica [name]             Date of Birth: 1950/12/14           MRN: [id number]             PCP: Donnie Coffin, MD Referring: Aurea Graff.Marlou Sa, MD Visit Date: 06/03/2016 Occupation: _0 @    Subjective:  Follow-up OA of the knee joint feet and hands  History of Present Illness: Jessica [name] is a 66 y.o. female   Last seen 02/10/2016. At that visit in October, we referred the patient for physical therapy for hip pain, low back pain, trouble getting up from the seated position, history of DDD of the L-spine. Had 5-6 sessions of Phys Therapy; semi-effective; did not feel comfortable w/ the therapist and d/c'd herself from further therapy.  Went to another Physical Therapy recommended by pcp but patient did not feel comfortable with that Physical Therapist after she was told that she "was not paying attention to him explaining things" to patient.  Patient reports that there is not much change in her symptoms since the last visit. I offered her new physical therapy referral and patient is agreeable. We discussed that she may benefit from seeing Dr. Barbaraann Barthel and patient is agreeable to try. If for some reason, she does not see I dye and she wants a new referral, I'll be happy to give her another referral.  Patient has ongoing knee joint pain which she she still describes as 1-2 on a scale of 0-10. Ongoing feet pain from osteoarthritis. Ongoing hand pain. She has lost about 1 pound but no more than that from the last visit. I advised the patient that this is still good progress since the holidays were just here and it is very easy to gain even more weight than that. I also advised the patient that it will take time to lose the proper weight so we should do it the right way with proper diet and exercise when possible. Knowing the patient has a fair amount of pain and discomfort at the moment, I advised the patient that it's understandable and it's  hard to exercise to the extent that patient wants since she's having so much discomfort.    Activities of Daily Living:  Patient reports morning stiffness for 60 minutes.   Patient Reports nocturnal pain.  Difficulty dressing/grooming: Reports Difficulty climbing stairs: Reports Difficulty getting out of chair: Reports Difficulty using hands for taps, buttons, cutlery, and/or writing: Reports   Review of Systems  Constitutional: Negative for fatigue.  HENT: Negative for mouth sores and mouth dryness.   Eyes: Negative for dryness.  Respiratory: Negative for shortness of breath.   Gastrointestinal: Negative for constipation and diarrhea.  Musculoskeletal: Negative for myalgias and myalgias.  Skin: Negative for sensitivity to sunlight.  Psychiatric/Behavioral: Negative for decreased concentration and sleep disturbance.    PMFS History:  Patient Active Problem List   Diagnosis Date Noted  . Hypothyroidism 06/04/2014  . Chest pain 01/23/2013  . ARF (acute renal failure) ([place]) 01/23/2013  . HTN (hypertension) 01/23/2013  . Leukocytosis 01/23/2013  . HLD (hyperlipidemia) 01/23/2013    Past Medical History:  Diagnosis Date  . Bipolar disorder ([place])   . Hypertension   . Thyroid disease     Family History  Problem Relation Age of Onset  . Thyroid disease Mother    Past Surgical History:  Procedure Laterality Date  . DILATION AND CURETTAGE OF UTERUS    .  LEG SURGERY    . TONSILLECTOMY     Social History   Social History Narrative  . No narrative on file     Objective: Vital Signs: BP (!) 150/86   Pulse 88   Resp (!) 22   Ht _0  (1.575 m)   Wt 295 lb (133.8 kg)   BMI 53.96 kg/m    Physical Exam  Constitutional: She is oriented to person, place, and time. She appears well-developed and well-nourished.  HENT:  Head: Normocephalic and atraumatic.  Eyes: EOM are normal. Pupils are equal, round, and reactive to light.  Cardiovascular: Normal rate, regular  rhythm and normal heart sounds.  Exam reveals no gallop and no friction rub.   No murmur heard. Pulmonary/Chest: Effort normal and breath sounds normal. She has no wheezes. She has no rales.  Abdominal: Soft. Bowel sounds are normal. She exhibits no distension. There is no tenderness. There is no guarding. No hernia.  Musculoskeletal: Normal range of motion. She exhibits no edema, tenderness or deformity.  Lymphadenopathy:    She has no cervical adenopathy.  Neurological: She is alert and oriented to person, place, and time. Coordination normal.  Skin: Skin is warm and dry. Capillary refill takes less than 2 seconds. No rash noted.  Psychiatric: She has a normal mood and affect. Her behavior is normal.     Musculoskeletal Exam:  Full range of motion of all joints Grip strength is equal and strong bilaterally Fibromyalgia tender points are all absent  CDAI Exam: CDAI Homunculus Exam:   Joint Counts:  CDAI Tender Joint count: 0 CDAI Swollen Joint count: 0  Global Assessments:  Patient Global Assessment: 2 Provider Global Assessment: 2  CDAI Calculated Score: 4  No synovitis on exam  Investigation: No additional findings. Orders Only on 04/14/2016  Component Date Value Ref Range Status  . WBC 04/14/2016 12.0* 3.8 - 10.8 K/uL Final  . RBC 04/14/2016 4.03  3.80 - 5.10 MIL/uL Final  . Hemoglobin 04/14/2016 12.1  11.7 - 15.5 g/dL Final  . HCT 04/14/2016 37.9  35.0 - 45.0 % Final  . MCV 04/14/2016 94.0  80.0 - 100.0 fL Final  . MCH 04/14/2016 30.0  27.0 - 33.0 pg Final  . MCHC 04/14/2016 31.9* 32.0 - 36.0 g/dL Final  . RDW 04/14/2016 13.5  11.0 - 15.0 % Final  . Platelets 04/14/2016 306  140 - 400 K/uL Final  . MPV 04/14/2016 9.5  7.5 - 12.5 fL Final  . Neutro Abs 04/14/2016 8040* 1,500 - 7,800 cells/uL Final  . Lymphs Abs 04/14/2016 2400  850 - 3,900 cells/uL Final  . Monocytes Absolute 04/14/2016 1320* 200 - 950 cells/uL Final  . Eosinophils Absolute 04/14/2016 240  15 -  500 cells/uL Final  . Basophils Absolute 04/14/2016 0  0 - 200 cells/uL Final  . Neutrophils Relative % 04/14/2016 67  % Final  . Lymphocytes Relative 04/14/2016 20  % Final  . Monocytes Relative 04/14/2016 11  % Final  . Eosinophils Relative 04/14/2016 2  % Final  . Basophils Relative 04/14/2016 0  % Final  . Smear Review 04/14/2016 Criteria for review not met   Final  . Sodium 04/14/2016 137  135 - 146 mmol/L Final  . Potassium 04/14/2016 5.1  3.5 - 5.3 mmol/L Final  . Chloride 04/14/2016 103  98 - 110 mmol/L Final  . CO2 04/14/2016 22  20 - 31 mmol/L Final  . Glucose, Bld 04/14/2016 88  65 - 99 mg/dL Final  .  BUN 04/14/2016 14  7 - 25 mg/dL Final  . Creat 04/14/2016 1.01* 0.50 - 0.99 mg/dL Final   Comment:   For patients > or = 67 years of age: The upper reference limit for Creatinine is approximately 13% higher for people identified as African-American.     . Total Bilirubin 04/14/2016 0.2  0.2 - 1.2 mg/dL Final  . Alkaline Phosphatase 04/14/2016 91  33 - 130 U/L Final  . AST 04/14/2016 21  10 - 35 U/L Final  . ALT 04/14/2016 16  6 - 29 U/L Final  . Total Protein 04/14/2016 6.6  6.1 - 8.1 g/dL Final  . Albumin 04/14/2016 3.9  3.6 - 5.1 g/dL Final  . Calcium 04/14/2016 9.7  8.6 - 10.4 mg/dL Final  . GFR, Est African American 04/14/2016 67  >=60 mL/min Final  . GFR, Est Non African American 04/14/2016 58* >=60 mL/min Final     Imaging: No results found.  Speciality Comments: No specialty comments available.    Procedures:  No procedures performed Allergies: Advil [ibuprofen]; Erythromycin base; and Promethazine-codeine   Assessment / Plan:     Visit Diagnoses: Primary osteoarthritis of both hands  Primary osteoarthritis of both knees  Osteoarthritis of ankle and foot, unspecified laterality  Chronic bilateral low back pain, with sciatica presence unspecified - Plan: Ambulatory referral to Physical Therapy  DDD (degenerative disc disease), lumbar - Plan:  Ambulatory referral to Physical Therapy    Orders: Orders Placed This Encounter  Procedures  . Ambulatory referral to Physical Therapy   Meds ordered this encounter  Medications  . baclofen (LIORESAL) 10 MG tablet    Sig: Take 0.5 tablets (5 mg total) by mouth 3 (three) times daily.    Dispense:  90 each    Refill:  5    Order Specific Question:   Supervising Provider    Answer:   Bo Merino [phone]    Face-to-face time spent with patient was 40 minutes. 50% of time was spent in counseling and coordination of care.  Follow-Up Instructions: Return in about 6 months (around 12/01/2016) for OA hands,feet,knees; low back pain' .   Eliezer Lofts, PA-C  Note - This record has been created using [employer].  Chart creation errors have been sought, but may not always  have been located. Such creation errors do not reflect on  the standard of medical care.

## 2016-06-07 ENCOUNTER — Telehealth: Payer: Self-pay | Admitting: Rheumatology

## 2016-06-07 NOTE — Telephone Encounter (Signed)
Patient has been taking Baclafen and is supposed to take 1/2 tab but has been taking a whole tablet. The medication makes her sleepy and would like to know if there is anything else she can take to help for her muscle spasms and back pain. CB# [phone] or [phone]

## 2016-06-08 MED ORDER — METHOCARBAMOL 500 MG PO TABS: ORAL_TABLET | ORAL | 5 refills | Status: DC

## 2016-06-08 NOTE — Telephone Encounter (Signed)
I discussed with patient the concern she had a bout baclofen.We've decided over the phone to switch to Robaxin.We'll call in Robaxin 500 mg half a tablet to 1 tablet up to 3 times a day when necessaryThirty-day supply with 5 refillsIn the meanwhile, stop the baclofen altogether.I've advised the patient to not through this medication away (baclofen) in case we want to go back to it in the future if Robaxin is not effective.Patient understands and is agreeable.  Okay to call in the following:Robaxin 500 mg half a tablet to 1 tablet up to 3 times a day when necessary muscle spasmsDispense 30 days supply with 5 refills

## 2016-06-08 NOTE — Telephone Encounter (Signed)
Patient states she was seen last week and was prescribed Baclofen. Patient states she did not realize the directions on the bottle said for her to only to take half a tablet 3 times a day and she ended up taking a whole tablet 3 times daily. Patient states she Baclofen is making her much sleepier and that it gives little relief with her pain. Patient states at her appointment there were several medications discussed. She is requesting a medication that is not going to increase drowsiness and that will be effective for her pain.

## 2016-06-08 NOTE — Telephone Encounter (Signed)
Prescription has been sent to the pharmacy. 

## 2016-06-14 ENCOUNTER — Telehealth: Payer: Self-pay | Admitting: Pharmacist

## 2016-06-14 NOTE — Telephone Encounter (Signed)
Received a letter from patient's insurance regarding "Polypharmacy in Seniors. Your older patient is receiving five or more medications from multiple prescribers based on claims records.  Please review this patient's therapy."    Reviewed patient's medications with her.  Patient reports she is currently taking acetaminophen 650 mg 6 tablets daily.  Discussed the purpose, proper use, and adverse effects of acetaminophen.  Advised patient to try to avoid using >3000 mg acetaminophen daily.    Noted that patient is currently prescribed Celebrex and methocarbamol from our office.  Noted that baclofen and chlorzoxazone is also listed on patient's medication list.  Patient confirms she is no longer taking either of these medications.  I counseled patient on the purpose, proper use, and adverse effects of methocarbamol.    Noted there is an interaction between Celebrex and lithium.  I have previously counseled patient on this interaction (see telephone note on 05/26/16).    Also note that patient is on furosemide, which may enhance the nephrotoxic effect of nonsteroidal anti-inflammatory agents such as celecoxib.  Patient had CMP on 04/14/16 which showed creatinine of 1.01 and GFR of 58 mL/min.  Recommend continuing to monitor patient's kidney function while on celecoxib and furosemide.      Elisabeth Most, Pharm.D., BCPS, CPP Clinical Pharmacist Pager: [phone] Phone: [phone] 06/14/2016 3:13 PM

## 2016-06-16 ENCOUNTER — Ambulatory Visit: Payer: Medicare Other | Admitting: Physical Therapy

## 2016-06-16 ENCOUNTER — Ambulatory Visit: Payer: Medicare Other | Attending: Rheumatology | Admitting: Physical Therapy

## 2016-06-16 ENCOUNTER — Encounter: Payer: Self-pay | Admitting: Physical Therapy

## 2016-06-16 DIAGNOSIS — M545 Low back pain: Secondary | ICD-10-CM | POA: Insufficient documentation

## 2016-06-16 DIAGNOSIS — G8929 Other chronic pain: Secondary | ICD-10-CM | POA: Diagnosis not present

## 2016-06-16 DIAGNOSIS — M62830 Muscle spasm of back: Secondary | ICD-10-CM | POA: Diagnosis not present

## 2016-06-16 DIAGNOSIS — R262 Difficulty in walking, not elsewhere classified: Secondary | ICD-10-CM | POA: Insufficient documentation

## 2016-06-16 DIAGNOSIS — M6281 Muscle weakness (generalized): Secondary | ICD-10-CM | POA: Insufficient documentation

## 2016-06-17 NOTE — Therapy (Signed)
[place], [place], [address] Phone: [phone]   Fax:  [phone]  Physical Therapy Evaluation  Patient Details  Name: Jessica [name] MRN: [id number] Date of Birth: 09/24/1950 Referring Provider: Ricarda Frame PTA  Encounter Date: 06/16/2016      PT End of Session - 06/17/16 1523    Visit Number 1   Number of Visits 16   Date for PT Re-Evaluation 08/12/16   Authorization Type Medicare    PT Start Time 0430   PT Stop Time 0520   PT Time Calculation (min) 50 min   Activity Tolerance Patient tolerated treatment well   Behavior During Therapy [place] for tasks assessed/performed      Past Medical History:  Diagnosis Date  . Bipolar disorder ([place])   . Hypertension   . Thyroid disease     Past Surgical History:  Procedure Laterality Date  . DILATION AND CURETTAGE OF UTERUS    . LEG SURGERY    . TONSILLECTOMY      There were no vitals filed for this visit.       Subjective Assessment - 06/16/16 1652    Subjective Patient has a long histroy of lower back pain. The pain has increased over the last 6 months to the point were she is having difficulty walking even moderate distances. She has been to therapy before but reports the therpist did not focus on her back. She reports she has been expierincing incontenence but the MD is aware and feels it is likley not coming from her back. She may seek a prescription for pelvic floor strengthening.  She has increased pain with activity. She has some difficulty lying down 2nd to sleep apnea.    Patient is accompained by: Family member   Pertinent History long histroy of lower back pain, morbid obesity    How long can you stand comfortably? limited ability to stand fro more then a shirt period of time    How long can you walk comfortably? Household distances with pain    Currently in Pain? Yes   Pain Score 1   6-7/10 after standing    Pain Location Back    Pain Orientation Mid   Pain Descriptors / Indicators Aching   Pain Type Acute pain   Pain Onset More than a month ago   Pain Frequency Constant   Aggravating Factors  standing, walking,    Pain Relieving Factors rest, sitting, lying down    Effect of Pain on Daily Activities difficulty perfroming ADL's             [facility] PT Assessment - 06/17/16 0001      Assessment   Medical Diagnosis Low back pain    Referring Provider Ricarda Frame PTA   Onset Date/Surgical Date --  Several years    Hand Dominance Right   Next MD Visit None sheduled at this time    Prior Therapy Several years ago went to a gym that was too noisy. Went to [facility] for PT for her back but there wa no tmuch focus on the back.       Precautions   Precautions None     Restrictions   Weight Bearing Restrictions No     Balance Screen   Has the patient fallen in the past 6 months No   Has the patient had a decrease in activity level because of a fear of falling?  No   Is the patient reluctant to  leave their home because of a fear of falling?  No     Prior Function   Level of Independence Requires assistive device for independence   Leisure To go for daily walks      Cognition   Overall Cognitive Status Within Functional Limits for tasks assessed   Attention Focused   Focused Attention Appears intact   Memory Appears intact   Awareness Appears intact   Problem Solving Appears intact   Executive Function Reasoning     Observation/Other Assessments   Observations Obesity, sits in extension    Focus on Therapeutic Outcomes (FOTO)  62% limitation      Sensation   Additional Comments No pain down the legs      Coordination   Gross Motor Movements are Fluid and Coordinated Yes   Fine Motor Movements are Fluid and Coordinated Yes     Posture/Postural Control   Posture/Postural Control Postural limitations   Postural Limitations Rounded Shoulders;Forward head     AROM   AROM Assessment Site  Lumbar   Lumbar Flexion patient reports she can not bend at her waist 100% limitation    Lumbar Extension 75% limited with pain    Lumbar - Right Side Bend 75% limited with pain    Lumbar - Left Side Bend 75% limited with pain    Lumbar - Right Rotation 75% limitation    Lumbar - Left Rotation 75% lkimitation      PROM   Overall PROM Comments right hip to 90 degrees ; Left leg to 110 degrees      Strength   Strength Assessment Site Hip   Right/Left Hip Right;Left   Right Hip Extension 3/5   Right Hip ABduction 2+/5   Right Hip ADduction 3+/5   Left Hip Flexion 2+/5   Left Hip ABduction 4/5   Left Hip ADduction 2+/5     Flexibility   Soft Tissue Assessment /Muscle Length yes     Transfers   Comments difficulty lying supien and rolling but able to do it with supervision      Ambulation/Gait   Gait Comments significant decrease in hip flexion, lateral movement with gait. Hip flexion limited by abdomen.                    [name] PT Treatment/Exercise - 06/17/16 0001      Lumbar Exercises: Seated   Other Seated Lumbar Exercises seated hip abduction x10 ; seated hamstring stretch 2x10      Lumbar Exercises: Supine   Other Supine Lumbar Exercises lateral trunk rotation x10                 PT Education - 06/17/16 1522    Education provided Yes   Education Details Importance of strengthening; HEP, Symptom management    Person(s) Educated Patient   Methods Explanation;Demonstration   Comprehension Verbalized understanding;Returned demonstration;Verbal cues required          PT Short Term Goals - 06/17/16 1533      PT SHORT TERM GOAL #1   Title Patient will demsotrate a fair core contraction    Time 4   Period Weeks   Status New     PT SHORT TERM GOAL #2   Title Patient will be independent with initial HEP and strengthening program    Time 4   Period Weeks   Status New     PT SHORT TERM GOAL #3   Title Patient will increcrease gross  lower extremity strength to 4/5    Time 4   Period Weeks   Status New     PT SHORT TERM GOAL #4   Title Patient ambulate 400' w/o significant increase in pain    Time 4   Period Weeks   Status New     PT SHORT TERM GOAL #5   Title Patient will report < 3/10 pain in her lumbar spine    Time 4   Period Weeks   Status New           PT Long Term Goals - 06/17/16 1546      PT LONG TERM GOAL #1   Title Patient will ambulate 3000' without increased pain in order to go shoping and to walk for recreation    Time 8   Period Weeks   Status New     PT LONG TERM GOAL #2   Title Patient will stand for a 1/2 hour without increased pain in order to perfrom ADL's    Time 8   Period Weeks   Status New     PT LONG TERM GOAL #3   Title Patient wil be independent with a strengthening program to improve overall health    Time 8   Period Weeks   Status New     PT LONG TERM GOAL #4   Title Patient will demsotrate a 53% disability on FOTO    Time 8   Period Weeks   Status New               Plan - 06/17/16 1526    Clinical Impression Statement Patient is a 66 year old female who presents withlower back pain that increases as she ambulates. The pain is across her lumbar spine. She has significant wekaness in her hips and core. She uses a cane for primary mobility. She has limited hip flexion  and rotation with ambulation. She would benefit from skilled therapy to adress the above deficits.  She was seen for a moderate complexity evaluation secondary to multpile co-morbidities as well as multiple functional deficits.    Rehab Potential Fair   Clinical Impairments Affecting Rehab Potential Obesity, poor baseline mobility    PT Frequency 2x / week   PT Duration 8 weeks   PT Treatment/Interventions ADLs/Self Care Home Management;Cryotherapy;Electrical Stimulation;Iontophoresis 4mg /ml Dexamethasone;Functional mobility training;Stair training;Gait training;Ultrasound;Moist  Heat;Traction;Therapeutic activities;Therapeutic exercise;Neuromuscular re-education;Patient/family education;Passive range of motion;Manual techniques;Dry needling;Taping   PT Next Visit Plan keep patient in a private room 2nd to anxiety. If patient lies supine she will require a wedge 2nd to apnea and weight. Consider seated postural exercises. Paossibly seated ball stretch and seated ball press. Consider manual therapy in sidelying. Patient will need extensive strengthenening and mobility training.    PT Home Exercise Plan lateral trunk rotation, ball squeeze, hip abduction    Consulted and Agree with Plan of Care Patient      Patient will benefit from skilled therapeutic intervention in order to improve the following deficits and impairments:  Abnormal gait, Difficulty walking, Increased fascial restricitons, Increased muscle spasms, Obesity, Pain, Decreased activity tolerance, Decreased mobility, Decreased strength, Postural dysfunction  Visit Diagnosis: Chronic bilateral low back pain without sciatica - Plan: PT PLAN OF CARE CERT/RE-CERT  Muscle spasm of back - Plan: PT PLAN OF CARE CERT/RE-CERT  Difficulty in walking, not elsewhere classified - Plan: PT PLAN OF CARE CERT/RE-CERT  Muscle weakness (generalized) - Plan: PT PLAN OF CARE CERT/RE-CERT      G-Codes -  06/17/16 1552    Functional Assessment Tool Used FOTO, clinical decision making    Functional Limitation Mobility: Walking and moving around   Mobility: Walking and Moving Around Current Status [phone]) At least 60 percent but less than 80 percent impaired, limited or restricted   Mobility: Walking and Moving Around Goal Status [phone]) At least 40 percent but less than 60 percent impaired, limited or restricted       Problem List Patient Active Problem List   Diagnosis Date Noted  . Hypothyroidism 06/04/2014  . Chest pain 01/23/2013  . ARF (acute renal failure) ([place]) 01/23/2013  . HTN (hypertension) 01/23/2013  .  Leukocytosis 01/23/2013  . HLD (hyperlipidemia) 01/23/2013    Carney Living 06/17/2016, 4:36 PM  [facility] [address] [place], [place], [address] Phone: [phone]   Fax:  [phone]  Name: Jessica [name] MRN: [id number] Date of Birth: 05-25-1950

## 2016-06-21 ENCOUNTER — Other Ambulatory Visit: Payer: Self-pay | Admitting: Rheumatology

## 2016-06-21 NOTE — Telephone Encounter (Signed)
Last Visit: 06/03/16 Next Visit: 11/25/16 Labs: 04/14/16 Creat 1.01 GFR 58 WBC 12.0  Okay to refill Celebrex?

## 2016-06-22 ENCOUNTER — Ambulatory Visit: Payer: Medicare Other | Admitting: Physical Therapy

## 2016-06-23 ENCOUNTER — Other Ambulatory Visit: Payer: Self-pay | Admitting: Family Medicine

## 2016-06-23 DIAGNOSIS — Z1231 Encounter for screening mammogram for malignant neoplasm of breast: Secondary | ICD-10-CM

## 2016-06-24 ENCOUNTER — Ambulatory Visit: Payer: Medicare Other | Admitting: Physical Therapy

## 2016-06-24 DIAGNOSIS — G4733 Obstructive sleep apnea (adult) (pediatric): Secondary | ICD-10-CM | POA: Diagnosis not present

## 2016-06-24 DIAGNOSIS — Z9989 Dependence on other enabling machines and devices: Secondary | ICD-10-CM | POA: Diagnosis not present

## 2016-06-29 ENCOUNTER — Encounter: Payer: Medicare Other | Admitting: Physical Therapy

## 2016-07-01 ENCOUNTER — Encounter: Payer: Medicare Other | Admitting: Physical Therapy

## 2016-07-06 ENCOUNTER — Encounter: Payer: Self-pay | Admitting: Physical Therapy

## 2016-07-06 ENCOUNTER — Ambulatory Visit: Payer: Medicare Other | Attending: Rheumatology | Admitting: Physical Therapy

## 2016-07-06 DIAGNOSIS — M62830 Muscle spasm of back: Secondary | ICD-10-CM | POA: Insufficient documentation

## 2016-07-06 DIAGNOSIS — M545 Low back pain: Secondary | ICD-10-CM | POA: Diagnosis not present

## 2016-07-06 DIAGNOSIS — M6281 Muscle weakness (generalized): Secondary | ICD-10-CM | POA: Diagnosis not present

## 2016-07-06 DIAGNOSIS — G8929 Other chronic pain: Secondary | ICD-10-CM | POA: Diagnosis not present

## 2016-07-06 DIAGNOSIS — R262 Difficulty in walking, not elsewhere classified: Secondary | ICD-10-CM | POA: Insufficient documentation

## 2016-07-07 DIAGNOSIS — F319 Bipolar disorder, unspecified: Secondary | ICD-10-CM | POA: Diagnosis not present

## 2016-07-07 NOTE — Patient Instructions (Signed)
Educatedpatient to do stretches on a table and to not lean over life the first picture. She was advised she can do the exercises without a ball.

## 2016-07-07 NOTE — Therapy (Signed)
[place], [place], [address] Phone: [phone]   Fax:  [phone]  Physical Therapy Treatment  Patient Details  Name: Jessica [name] MRN: [id number] Date of Birth: Oct 09, 1950 Referring Provider: Ricarda Frame PTA  Encounter Date: 07/06/2016      PT End of Session - 07/07/16 1133    Visit Number 2   Number of Visits 16   Date for PT Re-Evaluation 08/12/16   Authorization Type Medicare    PT Start Time 1415   PT Stop Time 1500   PT Time Calculation (min) 45 min   Activity Tolerance Patient tolerated treatment well   Behavior During Therapy [facility] for tasks assessed/performed      Past Medical History:  Diagnosis Date  . Bipolar disorder ([place])   . Hypertension   . Thyroid disease     Past Surgical History:  Procedure Laterality Date  . DILATION AND CURETTAGE OF UTERUS    . LEG SURGERY    . TONSILLECTOMY      There were no vitals filed for this visit.      Subjective Assessment - 07/07/16 1128    Subjective Patient comes in 3 weeks after her initial eval. She reports no improvement in pain. She also reports she has not done any of her exercises. She stes she has a " mental block" with therapy. Therapy advised her that if she does not perform her exercises at home on aconsistent basis then therapy will not work. She states she will try.    Patient is accompained by: Family member   Pertinent History long histroy of lower back pain, morbid obesity    How long can you stand comfortably? limited ability to stand fro more then a shirt period of time    How long can you walk comfortably? Household distances with pain    Patient Stated Goals to have less pain    Currently in Pain? Yes   Pain Score 7    Pain Location Back   Pain Orientation Mid   Pain Descriptors / Indicators Aching   Pain Type Acute pain   Pain Onset More than a month ago   Pain Frequency Constant   Aggravating Factors   standing, walking    Pain Relieving Factors rest, sitting, lying down    Effect of Pain on Daily Activities difficulty perfroming ADL's                          [facility] Adult PT Treatment/Exercise - 07/07/16 0001      Lumbar Exercises: Seated   Other Seated Lumbar Exercises seated hip abduction x10 ; seated hamstring stretch 3x15sec holds bilateral; seated exercise ball roll x10 forward, x10 right, x10 left; gentle press with core contraction 2x10;       Manual Therapy   Manual therapy comments Manual Hip flexion/IR/ER                 PT Education - 07/07/16 1132    Education provided Yes   Education Details importance of strengthening, HEP, symptom mangement    Person(s) Educated Patient   Methods Explanation;Demonstration   Comprehension Verbalized understanding;Returned demonstration;Verbal cues required          PT Short Term Goals - 06/17/16 1533      PT SHORT TERM GOAL #1   Title Patient will demsotrate a fair core contraction    Time 4   Period Weeks  Status New     PT SHORT TERM GOAL #2   Title Patient will be independent with initial HEP and strengthening program    Time 4   Period Weeks   Status New     PT SHORT TERM GOAL #3   Title Patient will increcrease gross lower extremity strength to 4/5    Time 4   Period Weeks   Status New     PT SHORT TERM GOAL #4   Title Patient ambulate 400' w/o significant increase in pain    Time 4   Period Weeks   Status New     PT SHORT TERM GOAL #5   Title Patient will report < 3/10 pain in her lumbar spine    Time 4   Period Weeks   Status New           PT Long Term Goals - 06/17/16 1546      PT LONG TERM GOAL #1   Title Patient will ambulate 3000' without increased pain in order to go shoping and to walk for recreation    Time 8   Period Weeks   Status New     PT LONG TERM GOAL #2   Title Patient will stand for a 1/2 hour without increased pain in order to perfrom ADL's     Time 8   Period Weeks   Status New     PT LONG TERM GOAL #3   Title Patient wil be independent with a strengthening program to improve overall health    Time 8   Period Weeks   Status New     PT LONG TERM GOAL #4   Title Patient will demsotrate a 53% disability on FOTO    Time 8   Period Weeks   Status New               Plan - 07/07/16 1134    Clinical Impression Statement Patient was strongly advised to do her exercises. At this time manual therapy will provide only temporary relief. Her pain today is in her lateral hips and likely comeing form lack of strenght in her legs. Patient advised that it will take a lot of work to get her back moving in the right direction. She will given her exercises a try. She was advised she also needs to actually come to therapy. She cancelled her visit on Thursday. She reports she will be back next week. No progress towards goals.    Rehab Potential Fair   Clinical Impairments Affecting Rehab Potential Obesity, poor baseline mobility    PT Treatment/Interventions ADLs/Self Care Home Management;Cryotherapy;Electrical Stimulation;Iontophoresis 4mg /ml Dexamethasone;Functional mobility training;Stair training;Gait training;Ultrasound;Moist Heat;Traction;Therapeutic activities;Therapeutic exercise;Neuromuscular re-education;Patient/family education;Passive range of motion;Manual techniques;Dry needling;Taping   PT Next Visit Plan keep patient in a private room 2nd to anxiety. If patient lies supine she will require a wedge 2nd to apnea and weight. Consider seated postural exercises. Paossibly seated ball stretch and seated ball press. Consider manual therapy in sidelying. Patient will need extensive strengthenening and mobility training. Consider discharge if patient does not do her exercises and does not come to therapy.    PT Home Exercise Plan lateral trunk rotation, ball squeeze, hip abduction    Consulted and Agree with Plan of Care Patient       Patient will benefit from skilled therapeutic intervention in order to improve the following deficits and impairments:  Abnormal gait, Difficulty walking, Increased fascial restricitons, Increased muscle spasms, Obesity, Pain, Decreased activity tolerance,  Decreased mobility, Decreased strength, Postural dysfunction  Visit Diagnosis: Chronic bilateral low back pain without sciatica  Muscle spasm of back  Difficulty in walking, not elsewhere classified  Muscle weakness (generalized)     Problem List Patient Active Problem List   Diagnosis Date Noted  . Hypothyroidism 06/04/2014  . Chest pain 01/23/2013  . ARF (acute renal failure) ([place]) 01/23/2013  . HTN (hypertension) 01/23/2013  . Leukocytosis 01/23/2013  . HLD (hyperlipidemia) 01/23/2013    Carney Living PT DPT  07/07/2016, 11:38 AM  [facility] [address] [place], [place], [address] Phone: [phone]   Fax:  [phone]  Name: Jessica [name] MRN: [id number] Date of Birth: 11-04-1950

## 2016-07-08 ENCOUNTER — Ambulatory Visit: Payer: Medicare Other | Admitting: Physical Therapy

## 2016-07-08 DIAGNOSIS — H353131 Nonexudative age-related macular degeneration, bilateral, early dry stage: Secondary | ICD-10-CM | POA: Diagnosis not present

## 2016-07-08 DIAGNOSIS — H04123 Dry eye syndrome of bilateral lacrimal glands: Secondary | ICD-10-CM | POA: Diagnosis not present

## 2016-07-09 ENCOUNTER — Ambulatory Visit: Payer: Medicare Other

## 2016-07-09 DIAGNOSIS — R351 Nocturia: Secondary | ICD-10-CM | POA: Diagnosis not present

## 2016-07-09 DIAGNOSIS — N3941 Urge incontinence: Secondary | ICD-10-CM | POA: Diagnosis not present

## 2016-07-09 DIAGNOSIS — Z79899 Other long term (current) drug therapy: Secondary | ICD-10-CM | POA: Diagnosis not present

## 2016-07-09 DIAGNOSIS — N816 Rectocele: Secondary | ICD-10-CM | POA: Diagnosis not present

## 2016-07-09 DIAGNOSIS — F3175 Bipolar disorder, in partial remission, most recent episode depressed: Secondary | ICD-10-CM | POA: Diagnosis not present

## 2016-07-09 DIAGNOSIS — N952 Postmenopausal atrophic vaginitis: Secondary | ICD-10-CM | POA: Diagnosis not present

## 2016-07-09 DIAGNOSIS — N3281 Overactive bladder: Secondary | ICD-10-CM | POA: Diagnosis not present

## 2016-07-13 ENCOUNTER — Ambulatory Visit: Payer: Medicare Other | Admitting: Physical Therapy

## 2016-07-13 DIAGNOSIS — M545 Low back pain, unspecified: Secondary | ICD-10-CM

## 2016-07-13 DIAGNOSIS — M6281 Muscle weakness (generalized): Secondary | ICD-10-CM

## 2016-07-13 DIAGNOSIS — M62830 Muscle spasm of back: Secondary | ICD-10-CM

## 2016-07-13 DIAGNOSIS — R262 Difficulty in walking, not elsewhere classified: Secondary | ICD-10-CM

## 2016-07-13 DIAGNOSIS — G8929 Other chronic pain: Secondary | ICD-10-CM | POA: Diagnosis not present

## 2016-07-14 NOTE — Therapy (Signed)
[place], [place], [address] Phone: [phone]   Fax:  [phone]  Physical Therapy Treatment  Patient Details  Name: Jessica [name] MRN: [id number] Date of Birth: January 12, 1951 Referring Provider: Ricarda Frame PTA  Encounter Date: 07/13/2016      PT End of Session - 07/13/16 1428    Visit Number 3   Number of Visits 16   Date for PT Re-Evaluation 08/12/16   Authorization Type Medicare    PT Start Time 0225  10 minutes late    PT Stop Time 0300   PT Time Calculation (min) 35 min      Past Medical History:  Diagnosis Date  . Bipolar disorder ([place])   . Hypertension   . Thyroid disease     Past Surgical History:  Procedure Laterality Date  . DILATION AND CURETTAGE OF UTERUS    . LEG SURGERY    . TONSILLECTOMY      There were no vitals filed for this visit.      Subjective Assessment - 07/13/16 1427    Subjective Carrying things and bending certain ways causes an increase in pain.    Currently in Pain? Yes   Pain Score 4    Pain Location Back   Pain Orientation Mid   Aggravating Factors  carrying, bending    Pain Relieving Factors rest, sitting, lying down                          [facility] Adult PT Treatment/Exercise - 07/14/16 0001      Lumbar Exercises: Stretches   Pelvic Tilt 10 seconds   Pelvic Tilt Limitations x 10 then with clams       Lumbar Exercises: Seated   Other Seated Lumbar Exercises seated hip abduction x10 ; seated hamstring stretch 3x15sec holds bilateral; seated exercise ball roll x10 forward, x10 right, x10 left; gentle press with core contraction 2x10;    seated pillow squeeze x 10      Lumbar Exercises: Supine   Other Supine Lumbar Exercises lateral trunk rotation x10      Lumbar Exercises: Sidelying   Clam 10 reps   Clam Limitations reverse clam                   PT Short Term Goals - 06/17/16 1533      PT SHORT TERM GOAL  #1   Title Patient will demsotrate a fair core contraction    Time 4   Period Weeks   Status New     PT SHORT TERM GOAL #2   Title Patient will be independent with initial HEP and strengthening program    Time 4   Period Weeks   Status New     PT SHORT TERM GOAL #3   Title Patient will increcrease gross lower extremity strength to 4/5    Time 4   Period Weeks   Status New     PT SHORT TERM GOAL #4   Title Patient ambulate 400' w/o significant increase in pain    Time 4   Period Weeks   Status New     PT SHORT TERM GOAL #5   Title Patient will report < 3/10 pain in her lumbar spine    Time 4   Period Weeks   Status New           PT Long Term Goals - 06/17/16 1546  PT LONG TERM GOAL #1   Title Patient will ambulate 3000' without increased pain in order to go shoping and to walk for recreation    Time 8   Period Weeks   Status New     PT LONG TERM GOAL #2   Title Patient will stand for a 1/2 hour without increased pain in order to perfrom ADL's    Time 8   Period Weeks   Status New     PT LONG TERM GOAL #3   Title Patient wil be independent with a strengthening program to improve overall health    Time 8   Period Weeks   Status New     PT LONG TERM GOAL #4   Title Patient will demsotrate a 53% disability on FOTO    Time 8   Period Weeks   Status New               Plan - 07/13/16 1523    Clinical Impression Statement Pt reports increased compliance with HEP since last visit. She has performed them 1 x per day. She has difficulty with supine positioning so we alternated positions from sidelying to supine during therex on the mat. She was concerned with the loud distracting sounds in the gym. She tolerated exercises well today with frequest short rest breaks required.    PT Next Visit Plan keep patient in a private room 2nd to anxiety. If patient lies supine she will require a wedge 2nd to apnea and weight. Consider seated postural exercises.  Paossibly seated ball stretch and seated ball press. Consider manual therapy in sidelying. Patient will need extensive strengthenening and mobility training. Consider discharge if patient does not do her exercises and does not come to therapy.    PT Home Exercise Plan lateral trunk rotation, supine abdominal physioballball squeeze, hip abduction with band    Consulted and Agree with Plan of Care Patient      Patient will benefit from skilled therapeutic intervention in order to improve the following deficits and impairments:  Abnormal gait, Difficulty walking, Increased fascial restricitons, Increased muscle spasms, Obesity, Pain, Decreased activity tolerance, Decreased mobility, Decreased strength, Postural dysfunction  Visit Diagnosis: Chronic bilateral low back pain without sciatica  Muscle spasm of back  Difficulty in walking, not elsewhere classified  Muscle weakness (generalized)     Problem List Patient Active Problem List   Diagnosis Date Noted  . Hypothyroidism 06/04/2014  . Chest pain 01/23/2013  . ARF (acute renal failure) ([place]) 01/23/2013  . HTN (hypertension) 01/23/2013  . Leukocytosis 01/23/2013  . HLD (hyperlipidemia) 01/23/2013    Dorene Ar, PTA 07/14/2016, [place], [place], [address] Phone: [phone]   Fax:  [phone]  Name: Jessica [name] MRN: [id number] Date of Birth: 1951/02/10

## 2016-07-15 ENCOUNTER — Encounter: Payer: Self-pay | Admitting: Physical Therapy

## 2016-07-15 ENCOUNTER — Ambulatory Visit: Payer: Medicare Other | Admitting: Physical Therapy

## 2016-07-15 DIAGNOSIS — M545 Low back pain, unspecified: Secondary | ICD-10-CM

## 2016-07-15 DIAGNOSIS — R262 Difficulty in walking, not elsewhere classified: Secondary | ICD-10-CM | POA: Diagnosis not present

## 2016-07-15 DIAGNOSIS — G8929 Other chronic pain: Secondary | ICD-10-CM

## 2016-07-15 DIAGNOSIS — M6281 Muscle weakness (generalized): Secondary | ICD-10-CM

## 2016-07-15 DIAGNOSIS — M62830 Muscle spasm of back: Secondary | ICD-10-CM

## 2016-07-16 NOTE — Therapy (Signed)
[place], [place], [address] Phone: [phone]   Fax:  [phone]  Physical Therapy Treatment  Patient Details  Name: Jessica [name] MRN: [id number] Date of Birth: 06/17/1950 Referring Provider: Ricarda Frame PTA  Encounter Date: 07/15/2016      PT End of Session - 07/15/16 1644    Visit Number 4   Number of Visits 16   Date for PT Re-Evaluation 08/12/16   Authorization Type Medicare    PT Start Time 1419   PT Stop Time 1505   PT Time Calculation (min) 46 min   Activity Tolerance Patient tolerated treatment well   Behavior During Therapy [facility] for tasks assessed/performed      Past Medical History:  Diagnosis Date  . Bipolar disorder ([place])   . Hypertension   . Thyroid disease     Past Surgical History:  Procedure Laterality Date  . DILATION AND CURETTAGE OF UTERUS    . LEG SURGERY    . TONSILLECTOMY      There were no vitals filed for this visit.      Subjective Assessment - 07/15/16 1431    Subjective Patient reports about a 5/10 in her lower back. She reports that she feels like she aches everywere.    Pertinent History long histroy of lower back pain, morbid obesity    How long can you stand comfortably? limited ability to stand fro more then a shirt period of time    How long can you walk comfortably? Household distances with pain    Currently in Pain? Yes   Pain Score 2    Pain Location Back   Pain Orientation Mid   Pain Descriptors / Indicators Aching   Pain Type Chronic pain   Pain Onset More than a month ago   Pain Frequency Constant   Aggravating Factors  carrying, bending    Pain Relieving Factors rest, lying down    Effect of Pain on Daily Activities difficulty perfroming ADL's                          [facility] Adult PT Treatment/Exercise - 07/16/16 0001      Lumbar Exercises: Stretches   Lower Trunk Rotation Limitations x10    Pelvic Tilt  Limitations x 10 then with clams       Lumbar Exercises: Seated   Other Seated Lumbar Exercises seated hip abduction x10 ; seated hamstring stretch 3x15sec holds bilateral; seated exercise ball roll x10 forward, x10 right, x10 left; gentle press with core contraction 2x10;    seated pillow squeeze x 10      Lumbar Exercises: Supine   Other Supine Lumbar Exercises lateral trunk rotation x10      Manual Therapy   Manual therapy comments Manual Hip flexion/IR/ER                 PT Education - 07/16/16 1155    Education provided Yes   Education Details Continue with HEP. Importanc eof improving hip flexion    Person(s) Educated Patient   Methods Explanation;Demonstration   Comprehension Verbalized understanding;Returned demonstration;Verbal cues required          PT Short Term Goals - 06/17/16 1533      PT SHORT TERM GOAL #1   Title Patient will demsotrate a fair core contraction    Time 4   Period Weeks   Status New     PT SHORT TERM  GOAL #2   Title Patient will be independent with initial HEP and strengthening program    Time 4   Period Weeks   Status New     PT SHORT TERM GOAL #3   Title Patient will increcrease gross lower extremity strength to 4/5    Time 4   Period Weeks   Status New     PT SHORT TERM GOAL #4   Title Patient ambulate 400' w/o significant increase in pain    Time 4   Period Weeks   Status New     PT SHORT TERM GOAL #5   Title Patient will report < 3/10 pain in her lumbar spine    Time 4   Period Weeks   Status New           PT Long Term Goals - 06/17/16 1546      PT LONG TERM GOAL #1   Title Patient will ambulate 3000' without increased pain in order to go shoping and to walk for recreation    Time 8   Period Weeks   Status New     PT LONG TERM GOAL #2   Title Patient will stand for a 1/2 hour without increased pain in order to perfrom ADL's    Time 8   Period Weeks   Status New     PT LONG TERM GOAL #3   Title  Patient wil be independent with a strengthening program to improve overall health    Time 8   Period Weeks   Status New     PT LONG TERM GOAL #4   Title Patient will demsotrate a 53% disability on FOTO    Time 8   Period Weeks   Status New               Plan - 07/16/16 1156    Clinical Impression Statement Patient tolerated exercises well although she had some pain with the ball press. Therapy continues to encourage the patient to work on her exercises at home.    Rehab Potential Fair   Clinical Impairments Affecting Rehab Potential Obesity, poor baseline mobility    PT Frequency 2x / week   PT Duration 8 weeks   PT Treatment/Interventions ADLs/Self Care Home Management;Cryotherapy;Electrical Stimulation;Iontophoresis 4mg /ml Dexamethasone;Functional mobility training;Stair training;Gait training;Ultrasound;Moist Heat;Traction;Therapeutic activities;Therapeutic exercise;Neuromuscular re-education;Patient/family education;Passive range of motion;Manual techniques;Dry needling;Taping   PT Next Visit Plan keep patient in a private room 2nd to anxiety. If patient lies supine she will require a wedge 2nd to apnea and weight. Consider seated postural exercises. Paossibly seated ball stretch and seated ball press. Consider manual therapy in sidelying. Patient will need extensive strengthenening and mobility training. Consider discharge if patient does not do her exercises and does not come to therapy. Aseess tolarnce to seated exercises.    PT Home Exercise Plan lateral trunk rotation, supine abdominal physioballball squeeze, hip abduction with band    Consulted and Agree with Plan of Care Patient      Patient will benefit from skilled therapeutic intervention in order to improve the following deficits and impairments:  Abnormal gait, Difficulty walking, Increased fascial restricitons, Increased muscle spasms, Obesity, Pain, Decreased activity tolerance, Decreased mobility, Decreased  strength, Postural dysfunction  Visit Diagnosis: Chronic bilateral low back pain without sciatica  Muscle spasm of back  Difficulty in walking, not elsewhere classified  Muscle weakness (generalized)     Problem List Patient Active Problem List   Diagnosis Date Noted  . Hypothyroidism 06/04/2014  .  Chest pain 01/23/2013  . ARF (acute renal failure) ([place]) 01/23/2013  . HTN (hypertension) 01/23/2013  . Leukocytosis 01/23/2013  . HLD (hyperlipidemia) 01/23/2013    Carney Living PT DPT  07/16/2016, 12:08 PM  [facility] [address] [place], [place], [address] Phone: [phone]   Fax:  [phone]  Name: AIYONNA LUCADO MRN: [id number] Date of Birth: July 27, 1950

## 2016-07-20 ENCOUNTER — Ambulatory Visit: Payer: Medicare Other | Admitting: Physical Therapy

## 2016-07-20 DIAGNOSIS — R262 Difficulty in walking, not elsewhere classified: Secondary | ICD-10-CM | POA: Diagnosis not present

## 2016-07-20 DIAGNOSIS — M545 Low back pain: Secondary | ICD-10-CM | POA: Diagnosis not present

## 2016-07-20 DIAGNOSIS — M62830 Muscle spasm of back: Secondary | ICD-10-CM | POA: Diagnosis not present

## 2016-07-20 DIAGNOSIS — M6281 Muscle weakness (generalized): Secondary | ICD-10-CM | POA: Diagnosis not present

## 2016-07-20 DIAGNOSIS — G8929 Other chronic pain: Secondary | ICD-10-CM | POA: Diagnosis not present

## 2016-07-20 NOTE — Therapy (Signed)
[place], [place], [address] Phone: [phone]   Fax:  [phone]  Physical Therapy Treatment  Patient Details  Name: Jessica [name] MRN: [id number] Date of Birth: Mar 05, 1951 Referring Provider: Ricarda Frame PTA  Encounter Date: 07/20/2016      PT End of Session - 07/20/16 1425    Visit Number 5   Number of Visits 16   Date for PT Re-Evaluation 08/12/16   Authorization Type Medicare    PT Start Time 0222   PT Stop Time 0300   PT Time Calculation (min) 38 min   Activity Tolerance Patient tolerated treatment well   Behavior During Therapy [facility] for tasks assessed/performed      Past Medical History:  Diagnosis Date  . Bipolar disorder ([place])   . Hypertension   . Thyroid disease     Past Surgical History:  Procedure Laterality Date  . DILATION AND CURETTAGE OF UTERUS    . LEG SURGERY    . TONSILLECTOMY      There were no vitals filed for this visit.      Subjective Assessment - 07/20/16 1424    Subjective 2/10 pain today. Carrying purse so that makes it worse.    Currently in Pain? Yes   Pain Score 2    Pain Location Back   Pain Orientation Mid;Lower   Pain Descriptors / Indicators Aching                         [facility] Adult PT Treatment/Exercise - 07/20/16 0001      Lumbar Exercises: Aerobic   Stationary Bike nustep L1 x 5 minutes LE only      Lumbar Exercises: Seated   Other Seated Lumbar Exercises seated scap squeeze, rows with yellow band x 15 , physioball roll on stool forward x 10, ball press for isometric abdominals seated x5, standing x 5 with pt c/o frustration with coordinating breathing,      Lumbar Exercises: Supine   Clam Limitations red band x10      Lumbar Exercises: Sidelying   Clam 10 reps  added red band    Clam Limitations reverse clam      Manual Therapy   Manual therapy comments palpation to lumbar spine in sidelying, tender in  lumbar paraspinals.                   PT Short Term Goals - 06/17/16 1533      PT SHORT TERM GOAL #1   Title Patient will demsotrate a fair core contraction    Time 4   Period Weeks   Status New     PT SHORT TERM GOAL #2   Title Patient will be independent with initial HEP and strengthening program    Time 4   Period Weeks   Status New     PT SHORT TERM GOAL #3   Title Patient will increcrease gross lower extremity strength to 4/5    Time 4   Period Weeks   Status New     PT SHORT TERM GOAL #4   Title Patient ambulate 400' w/o significant increase in pain    Time 4   Period Weeks   Status New     PT SHORT TERM GOAL #5   Title Patient will report < 3/10 pain in her lumbar spine    Time 4   Period Weeks   Status New  PT Long Term Goals - 06/17/16 1546      PT LONG TERM GOAL #1   Title Patient will ambulate 3000' without increased pain in order to go shoping and to walk for recreation    Time 8   Period Weeks   Status New     PT LONG TERM GOAL #2   Title Patient will stand for a 1/2 hour without increased pain in order to perfrom ADL's    Time 8   Period Weeks   Status New     PT LONG TERM GOAL #3   Title Patient wil be independent with a strengthening program to improve overall health    Time 8   Period Weeks   Status New     PT LONG TERM GOAL #4   Title Patient will demsotrate a 53% disability on FOTO    Time 8   Period Weeks   Status New               Plan - 07/20/16 1426    Clinical Impression Statement Walking more every day. Pain not as intense, able to last longer with standing for shopping still required multiple rest breaks. Nustep trial today as well as yellow band rows. Will assess tolerance next visit.    PT Next Visit Plan keep patient in a private room 2nd to anxiety. If patient lies supine she will require a wedge 2nd to apnea and weight. Repeat seated postural exercises-possibly add to HEP . Paossibly seated  ball stretch and seated ball press. Consider manual therapy in sidelying. Patient will need extensive strengthenening and mobility training. Consider discharge if patient does not do her exercises and does not come to therapy. Aseess tolarnce to seated exercises.    PT Home Exercise Plan lateral trunk rotation, supine abdominal physioballball squeeze, hip abduction with band    Consulted and Agree with Plan of Care Patient      Patient will benefit from skilled therapeutic intervention in order to improve the following deficits and impairments:  Abnormal gait, Difficulty walking, Increased fascial restricitons, Increased muscle spasms, Obesity, Pain, Decreased activity tolerance, Decreased mobility, Decreased strength, Postural dysfunction  Visit Diagnosis: Chronic bilateral low back pain without sciatica  Muscle spasm of back  Difficulty in walking, not elsewhere classified  Muscle weakness (generalized)     Problem List Patient Active Problem List   Diagnosis Date Noted  . Hypothyroidism 06/04/2014  . Chest pain 01/23/2013  . ARF (acute renal failure) ([place]) 01/23/2013  . HTN (hypertension) 01/23/2013  . Leukocytosis 01/23/2013  . HLD (hyperlipidemia) 01/23/2013    Dorene Ar , PTA 07/20/2016, 5:10 PM  [place], [place], [address] Phone: [phone]   Fax:  [phone]  Name: Jessica [name] MRN: [id number] Date of Birth: February 23, 1951

## 2016-07-22 ENCOUNTER — Ambulatory Visit: Payer: Medicare Other | Admitting: Physical Therapy

## 2016-07-22 DIAGNOSIS — D489 Neoplasm of uncertain behavior, unspecified: Secondary | ICD-10-CM | POA: Diagnosis not present

## 2016-07-22 DIAGNOSIS — L309 Dermatitis, unspecified: Secondary | ICD-10-CM | POA: Diagnosis not present

## 2016-07-22 DIAGNOSIS — K602 Anal fissure, unspecified: Secondary | ICD-10-CM | POA: Diagnosis not present

## 2016-07-26 ENCOUNTER — Ambulatory Visit: Payer: Medicare Other | Admitting: Physical Therapy

## 2016-07-26 DIAGNOSIS — M62830 Muscle spasm of back: Secondary | ICD-10-CM

## 2016-07-26 DIAGNOSIS — G8929 Other chronic pain: Secondary | ICD-10-CM | POA: Diagnosis not present

## 2016-07-26 DIAGNOSIS — M545 Low back pain, unspecified: Secondary | ICD-10-CM

## 2016-07-26 DIAGNOSIS — R262 Difficulty in walking, not elsewhere classified: Secondary | ICD-10-CM

## 2016-07-26 DIAGNOSIS — M6281 Muscle weakness (generalized): Secondary | ICD-10-CM | POA: Diagnosis not present

## 2016-07-26 NOTE — Therapy (Signed)
Jessica [name], [place], [address] Phone: [phone]   Fax:  [phone]  Physical Therapy Treatment  Patient Details  Name: Jessica [name] MRN: [id number] Date of Birth: Sep 04, 1950 Referring Provider: Ricarda Frame PTA  Encounter Date: 07/26/2016      PT End of Session - 07/26/16 1116    Visit Number 6   Number of Visits 16   Date for PT Re-Evaluation 08/12/16   Authorization Type Medicare    PT Start Time 1109   PT Stop Time 1150   PT Time Calculation (min) 41 min   Activity Tolerance Patient tolerated treatment well   Behavior During Therapy [facility] for tasks assessed/performed      Past Medical History:  Diagnosis Date  . Bipolar disorder ([place])   . Hypertension   . Thyroid disease     Past Surgical History:  Procedure Laterality Date  . DILATION AND CURETTAGE OF UTERUS    . LEG SURGERY    . TONSILLECTOMY      There were no vitals filed for this visit.      Subjective Assessment - 07/26/16 1114    Subjective Getting out of bed was painful this morning.    Currently in Pain? Yes   Pain Score 3    Pain Location Back   Pain Orientation Mid;Lower   Pain Descriptors / Indicators Aching   Aggravating Factors  carrying, bending   Pain Relieving Factors rest, lying down                          [facility] Adult PT Treatment/Exercise - 07/26/16 0001      Lumbar Exercises: Stretches   Lower Trunk Rotation Limitations x10      Lumbar Exercises: Aerobic   Stationary Bike nustep L1 x 6 minutes LE/UE only      Lumbar Exercises: Standing   Heel Raises 10 reps   Other Standing Lumbar Exercises standing 3 way hip x 10 each with cues for abdominal engagement.      Lumbar Exercises: Supine   Ab Set 10 reps   Glut Set 10 reps   Clam Limitations green band    Straight Leg Raise 5 reps   Straight Leg Raises Limitations with ab draw in   Other Supine Lumbar Exercises lateral trunk  rotation x10      Manual Therapy   Manual Therapy Passive ROM   Passive ROM bilateral hamstring stretch and knee to chest streaches.                   PT Short Term Goals - 06/17/16 1533      PT SHORT TERM GOAL #1   Title Patient will demsotrate a fair core contraction    Time 4   Period Weeks   Status New     PT SHORT TERM GOAL #2   Title Patient will be independent with initial HEP and strengthening program    Time 4   Period Weeks   Status New     PT SHORT TERM GOAL #3   Title Patient will increcrease gross lower extremity strength to 4/5    Time 4   Period Weeks   Status New     PT SHORT TERM GOAL #4   Title Patient ambulate 400' w/o significant increase in pain    Time 4   Period Weeks   Status New     PT SHORT TERM  GOAL #5   Title Patient will report < 3/10 pain in her lumbar spine    Time 4   Period Weeks   Status New           PT Long Term Goals - 06/17/16 1546      PT LONG TERM GOAL #1   Title Patient will ambulate 3000' without increased pain in order to go shoping and to walk for recreation    Time 8   Period Weeks   Status New     PT LONG TERM GOAL #2   Title Patient will stand for a 1/2 hour without increased pain in order to perfrom ADL's    Time 8   Period Weeks   Status New     PT LONG TERM GOAL #3   Title Patient wil be independent with a strengthening program to improve overall health    Time 8   Period Weeks   Status New     PT LONG TERM GOAL #4   Title Patient will demsotrate a 53% disability on FOTO    Time 8   Period Weeks   Status New               Plan - 07/26/16 1119    Clinical Impression Statement [place] and over did it. She required multiple rest breaks on her rollator. She is unable to tolerate walking without seated reast breaks. Began standing hip/core strengthening with c/ o increased pain. Able to complete on set each with a rest break.  Continued endurance training on elliptical with good tolerance.    PT Next Visit Plan CHECK STGs. keep patient in a private room 2nd to anxiety. If patient lies supine she will require a wedge 2nd to apnea and weight. Repeat seated postural exercises-possibly add to HEP . Paossibly seated ball stretch and seated ball press. Consider manual therapy in sidelying. Patient will need extensive strengthenening and mobility training. Consider discharge if patient does not do her exercises and does not come to therapy. Aseess tolarnce to seated exercises.    PT Home Exercise Plan lateral trunk rotation, supine abdominal physioballball squeeze, hip abduction with band    Consulted and Agree with Plan of Care Patient      Patient will benefit from skilled therapeutic intervention in order to improve the following deficits and impairments:  Abnormal gait, Difficulty walking, Increased fascial restricitons, Increased muscle spasms, Obesity, Pain, Decreased activity tolerance, Decreased mobility, Decreased strength, Postural dysfunction  Visit Diagnosis: Chronic bilateral low back pain without sciatica  Muscle spasm of back  Difficulty in walking, not elsewhere classified  Muscle weakness (generalized)     Problem List Patient Active Problem List   Diagnosis Date Noted  . Hypothyroidism 06/04/2014  . Chest pain 01/23/2013  . ARF (acute renal failure) ([place]) 01/23/2013  . HTN (hypertension) 01/23/2013  . Leukocytosis 01/23/2013  . HLD (hyperlipidemia) 01/23/2013    Dorene Ar, PTA 07/26/2016, 12:16 PM  [place], [place], [address] Phone: [phone]   Fax:  [phone]  Name: Jessica [name] MRN: [id number] Date of Birth: 1951/02/28

## 2016-07-27 ENCOUNTER — Ambulatory Visit
Admission: RE | Admit: 2016-07-27 | Discharge: 2016-07-27 | Disposition: A | Discharge status: Home / Self Care | Payer: Medicare Other | Source: Ambulatory Visit | Attending: Family Medicine | Admitting: Family Medicine

## 2016-07-27 DIAGNOSIS — Z1231 Encounter for screening mammogram for malignant neoplasm of breast: Secondary | ICD-10-CM | POA: Diagnosis not present

## 2016-07-29 ENCOUNTER — Ambulatory Visit: Payer: Medicare Other | Admitting: Physical Therapy

## 2016-07-29 DIAGNOSIS — H524 Presbyopia: Secondary | ICD-10-CM | POA: Diagnosis not present

## 2016-07-29 DIAGNOSIS — H353131 Nonexudative age-related macular degeneration, bilateral, early dry stage: Secondary | ICD-10-CM | POA: Diagnosis not present

## 2016-07-29 DIAGNOSIS — H04123 Dry eye syndrome of bilateral lacrimal glands: Secondary | ICD-10-CM | POA: Diagnosis not present

## 2016-07-30 DIAGNOSIS — Z9989 Dependence on other enabling machines and devices: Secondary | ICD-10-CM | POA: Diagnosis not present

## 2016-07-30 DIAGNOSIS — G4733 Obstructive sleep apnea (adult) (pediatric): Secondary | ICD-10-CM | POA: Diagnosis not present

## 2016-08-03 ENCOUNTER — Ambulatory Visit: Payer: Medicare Other | Attending: Rheumatology | Admitting: Physical Therapy

## 2016-08-03 DIAGNOSIS — M6281 Muscle weakness (generalized): Secondary | ICD-10-CM | POA: Diagnosis not present

## 2016-08-03 DIAGNOSIS — R262 Difficulty in walking, not elsewhere classified: Secondary | ICD-10-CM

## 2016-08-03 DIAGNOSIS — M62830 Muscle spasm of back: Secondary | ICD-10-CM

## 2016-08-03 DIAGNOSIS — G8929 Other chronic pain: Secondary | ICD-10-CM | POA: Diagnosis not present

## 2016-08-03 DIAGNOSIS — M545 Low back pain: Secondary | ICD-10-CM | POA: Diagnosis not present

## 2016-08-03 NOTE — Therapy (Addendum)
[place], [place], [address] Phone: [phone]   Fax:  [phone]  Physical Therapy Treatment./ Discharge  Patient Details  Name: Jessica [name] MRN: [id number] Date of Birth: [birth year] Referring Provider: Ricarda Frame PTA  Encounter Date: 08/03/2016      PT End of Session - 08/03/16 1608    Visit Number 7   Number of Visits 16   Date for PT Re-Evaluation 08/12/16   Authorization Type Medicare    PT Start Time 0223   PT Stop Time 0258   PT Time Calculation (min) 35 min      Past Medical History:  Diagnosis Date  . Bipolar disorder ([place])   . Hypertension   . Thyroid disease     Past Surgical History:  Procedure Laterality Date  . DILATION AND CURETTAGE OF UTERUS    . LEG SURGERY    . TONSILLECTOMY      There were no vitals filed for this visit.                       [facility] Adult PT Treatment/Exercise - 08/03/16 0001      Ambulation/Gait   Gait velocity 1.93 ft/sec   Gait Comments 152f in  1 min 6 sec; 164 ft 1 min 25 sec     Lumbar Exercises: Aerobic   Stationary Bike Nustep L1 x 7 minutes      Lumbar Exercises: Standing   Other Standing Lumbar Exercises hip flexion x 10 each      Lumbar Exercises: Supine   Clam Limitations blue band x 10   Bridge --  8 reps   Straight Leg Raise --  6 reps                   PT Short Term Goals - 08/03/16 1607      PT SHORT TERM GOAL #1   Title Patient will demsotrate a fair core contraction    Time 4   Period Weeks   Status Achieved     PT SHORT TERM GOAL #2   Title Patient will be independent with initial HEP and strengthening program    Baseline trying to do exercises   Time 4   Period Weeks   Status Partially Met     PT SHORT TERM GOAL #3   Title Patient will increcrease gross lower extremity strength to 4/5    Time 4   Period Weeks   Status On-going     PT SHORT TERM GOAL #4   Title  Patient ambulate 400' w/o significant increase in pain    Baseline 164 ft prior to seated break due to pain   Time 4   Period Weeks   Status On-going     PT SHORT TERM GOAL #5   Title Patient will report < 3/10 pain in her lumbar spine    Time 4   Period Weeks   Status On-going           PT Long Term Goals - 08/03/16 1436      PT LONG TERM GOAL #1   Title Patient will ambulate 3000' without increased pain in order to go shoping and to walk for recreation    Time 8   Period Weeks   Status On-going     PT LONG TERM GOAL #2   Title Patient will stand for a 1/2 hour without increased pain in order to perfrom ADL's  Baseline 8-10 minutes   Time 8   Period Weeks   Status On-going     PT LONG TERM GOAL #3   Title Patient wil be independent with a strengthening program to improve overall health    Time 8   Period Weeks   Status On-going     PT LONG TERM GOAL #4   Title Patient will demsotrate a 53% disability on FOTO    Time 8   Period Weeks   Status Unable to assess               Plan - 08/03/16 1612    Clinical Impression Statement Gait in clinic 171f prior to requiring rest break due to increased pain. Pt reports she can stand 8-10 minutes prior to increased pain. She is making slow progress toward goals.    PT Next Visit Plan Continue Nustep progression;  keep patient in a private room 2nd to anxiety.  Repeat seated postural exercises-possibly add to HEP . Paossibly seated ball stretch and seated ball press. Consider manual therapy in sidelying. Patient will need extensive strengthenening and mobility training. Consider discharge if patient does not do her exercises and does not come to therapy. Aseess tolarnce to seated exercises.    PT Home Exercise Plan lateral trunk rotation, supine abdominal physioballball squeeze, hip abduction with band    Consulted and Agree with Plan of Care Patient      Patient will benefit from skilled therapeutic intervention in  order to improve the following deficits and impairments:  Abnormal gait, Difficulty walking, Increased fascial restricitons, Increased muscle spasms, Obesity, Pain, Decreased activity tolerance, Decreased mobility, Decreased strength, Postural dysfunction  Visit Diagnosis: Chronic bilateral low back pain without sciatica  Muscle spasm of back  Difficulty in walking, not elsewhere classified  Muscle weakness (generalized)  PHYSICAL THERAPY DISCHARGE SUMMARY  Visits from Start of Care: 7  Current functional level related to goals / functional outcomes: Did not return     Remaining deficits: Did not return  Education / Equipment: Did not return  Plan: Patient agrees to discharge.  Patient goals were not met. Patient is being discharged due to not returning since the last visit.  ?????       Problem List Patient Active Problem List   Diagnosis Date Noted  . Hypothyroidism 06/04/2014  . Chest pain 01/23/2013  . [place]  . HTN (hypertension) 01/23/2013  . Leukocytosis 01/23/2013  . HLD (hyperlipidemia) 01/23/2013   DCarolyne LittlesPT DPT  10/14/2016 11:00  DDorene Ar PTA 08/03/2016, 4:18 PM  [facility] [address]Phone: [phone]  Fax:  [phone] Name: MTANAIA HAWKEYMRN: [id number] of Birth: 304-15-1952

## 2016-08-05 ENCOUNTER — Ambulatory Visit: Payer: Medicare Other | Admitting: Physical Therapy

## 2016-08-05 DIAGNOSIS — R079 Chest pain, unspecified: Secondary | ICD-10-CM | POA: Diagnosis not present

## 2016-08-05 DIAGNOSIS — N950 Postmenopausal bleeding: Secondary | ICD-10-CM | POA: Diagnosis not present

## 2016-08-05 DIAGNOSIS — B356 Tinea cruris: Secondary | ICD-10-CM | POA: Diagnosis not present

## 2016-08-05 DIAGNOSIS — N952 Postmenopausal atrophic vaginitis: Secondary | ICD-10-CM | POA: Diagnosis not present

## 2016-08-06 ENCOUNTER — Other Ambulatory Visit: Payer: Self-pay | Admitting: Physician Assistant

## 2016-08-06 DIAGNOSIS — R079 Chest pain, unspecified: Secondary | ICD-10-CM

## 2016-08-09 ENCOUNTER — Ambulatory Visit (HOSPITAL_COMMUNITY): Payer: Medicare Other

## 2016-08-11 ENCOUNTER — Ambulatory Visit (HOSPITAL_COMMUNITY): Payer: Medicare Other

## 2016-08-19 DIAGNOSIS — M25552 Pain in left hip: Secondary | ICD-10-CM | POA: Diagnosis not present

## 2016-08-19 DIAGNOSIS — M545 Low back pain: Secondary | ICD-10-CM | POA: Diagnosis not present

## 2016-08-19 DIAGNOSIS — Z8739 Personal history of other diseases of the musculoskeletal system and connective tissue: Secondary | ICD-10-CM | POA: Diagnosis not present

## 2016-08-19 DIAGNOSIS — B379 Candidiasis, unspecified: Secondary | ICD-10-CM | POA: Diagnosis not present

## 2016-09-02 ENCOUNTER — Other Ambulatory Visit: Payer: Self-pay | Admitting: Family Medicine

## 2016-09-02 DIAGNOSIS — M545 Low back pain: Secondary | ICD-10-CM | POA: Diagnosis not present

## 2016-09-02 DIAGNOSIS — L309 Dermatitis, unspecified: Secondary | ICD-10-CM | POA: Diagnosis not present

## 2016-09-02 DIAGNOSIS — J069 Acute upper respiratory infection, unspecified: Secondary | ICD-10-CM | POA: Diagnosis not present

## 2016-09-07 ENCOUNTER — Telehealth (HOSPITAL_COMMUNITY): Payer: Self-pay | Admitting: *Deleted

## 2016-09-07 NOTE — Telephone Encounter (Signed)
Patient given detailed instructions per Myocardial Perfusion Study Information Sheet for the test on 09/13/16 at 1245. Patient notified to arrive 15 minutes early and that it is imperative to arrive on time for appointment to keep from having the test rescheduled.  If you need to cancel or reschedule your appointment, please call the office within 24 hours of your appointment. Failure to do so may result in a cancellation of your appointment, and a $50 no show fee. Patient verbalized understanding.Jlen Wintle, Ranae Palms

## 2016-09-13 ENCOUNTER — Ambulatory Visit (HOSPITAL_COMMUNITY): Payer: Medicare Other | Attending: Cardiology

## 2016-09-13 DIAGNOSIS — R0600 Dyspnea, unspecified: Secondary | ICD-10-CM | POA: Insufficient documentation

## 2016-09-13 DIAGNOSIS — I10 Essential (primary) hypertension: Secondary | ICD-10-CM | POA: Insufficient documentation

## 2016-09-13 DIAGNOSIS — R079 Chest pain, unspecified: Secondary | ICD-10-CM | POA: Diagnosis not present

## 2016-09-13 DIAGNOSIS — E669 Obesity, unspecified: Secondary | ICD-10-CM | POA: Insufficient documentation

## 2016-09-13 DIAGNOSIS — Z6841 Body Mass Index (BMI) 40.0 and over, adult: Secondary | ICD-10-CM | POA: Insufficient documentation

## 2016-09-13 DIAGNOSIS — I2510 Atherosclerotic heart disease of native coronary artery without angina pectoris: Secondary | ICD-10-CM | POA: Diagnosis present

## 2016-09-14 ENCOUNTER — Encounter (INDEPENDENT_AMBULATORY_CARE_PROVIDER_SITE_OTHER): Payer: Self-pay

## 2016-09-14 ENCOUNTER — Ambulatory Visit (HOSPITAL_COMMUNITY): Payer: Medicare Other | Attending: Cardiology

## 2016-09-14 DIAGNOSIS — R0600 Dyspnea, unspecified: Secondary | ICD-10-CM | POA: Diagnosis not present

## 2016-09-14 DIAGNOSIS — E669 Obesity, unspecified: Secondary | ICD-10-CM | POA: Diagnosis not present

## 2016-09-14 DIAGNOSIS — I10 Essential (primary) hypertension: Secondary | ICD-10-CM | POA: Diagnosis not present

## 2016-09-14 DIAGNOSIS — Z6841 Body Mass Index (BMI) 40.0 and over, adult: Secondary | ICD-10-CM | POA: Diagnosis not present

## 2016-09-14 DIAGNOSIS — R079 Chest pain, unspecified: Secondary | ICD-10-CM | POA: Diagnosis not present

## 2016-09-14 LAB — MYOCARDIAL PERFUSION IMAGING
LV dias vol: 79 mL (ref 46–106)
LV sys vol: 20 mL
Peak HR: 98 {beats}/min
RATE: 0.27
Rest HR: 88 {beats}/min
SDS: 5
SRS: 3
SSS: 8
TID: 0.96

## 2016-09-14 MED ORDER — REGADENOSON 0.4 MG/5ML IV SOLN
0.4000 mg | Freq: Once | INTRAVENOUS | Status: AC
  Administered 2016-09-14: 0.4 mg via INTRAVENOUS

## 2016-09-14 MED ORDER — TECHNETIUM TC 99M TETROFOSMIN IV KIT
31.5000 | PACK | Freq: Once | INTRAVENOUS | Status: AC | PRN
  Administered 2016-09-13: 31.5 via INTRAVENOUS
  Filled 2016-09-14: qty 32

## 2016-09-14 MED ORDER — TECHNETIUM TC 99M TETROFOSMIN IV KIT
33.0000 | PACK | Freq: Once | INTRAVENOUS | Status: AC | PRN
  Administered 2016-09-14: 33 via INTRAVENOUS
  Filled 2016-09-14: qty 33

## 2016-09-17 ENCOUNTER — Other Ambulatory Visit: Payer: Medicare Other

## 2016-09-24 DIAGNOSIS — G4733 Obstructive sleep apnea (adult) (pediatric): Secondary | ICD-10-CM | POA: Diagnosis not present

## 2016-09-24 DIAGNOSIS — Z9989 Dependence on other enabling machines and devices: Secondary | ICD-10-CM | POA: Diagnosis not present

## 2016-09-28 ENCOUNTER — Ambulatory Visit
Admission: RE | Admit: 2016-09-28 | Discharge: 2016-09-28 | Disposition: A | Discharge status: Home / Self Care | Payer: Medicare Other | Source: Ambulatory Visit | Attending: Family Medicine | Admitting: Family Medicine

## 2016-09-28 DIAGNOSIS — M545 Low back pain: Secondary | ICD-10-CM

## 2016-09-29 DIAGNOSIS — F319 Bipolar disorder, unspecified: Secondary | ICD-10-CM | POA: Diagnosis not present

## 2016-10-04 DIAGNOSIS — J309 Allergic rhinitis, unspecified: Secondary | ICD-10-CM | POA: Diagnosis not present

## 2016-10-04 DIAGNOSIS — E039 Hypothyroidism, unspecified: Secondary | ICD-10-CM | POA: Diagnosis not present

## 2016-10-04 DIAGNOSIS — M545 Low back pain: Secondary | ICD-10-CM | POA: Diagnosis not present

## 2016-10-04 DIAGNOSIS — Z23 Encounter for immunization: Secondary | ICD-10-CM | POA: Diagnosis not present

## 2016-10-04 DIAGNOSIS — E7800 Pure hypercholesterolemia, unspecified: Secondary | ICD-10-CM | POA: Diagnosis not present

## 2016-10-04 DIAGNOSIS — N3281 Overactive bladder: Secondary | ICD-10-CM | POA: Diagnosis not present

## 2016-10-07 DIAGNOSIS — M5136 Other intervertebral disc degeneration, lumbar region: Secondary | ICD-10-CM | POA: Diagnosis not present

## 2016-10-07 DIAGNOSIS — M545 Low back pain: Secondary | ICD-10-CM | POA: Diagnosis not present

## 2016-10-07 DIAGNOSIS — M47816 Spondylosis without myelopathy or radiculopathy, lumbar region: Secondary | ICD-10-CM | POA: Diagnosis not present

## 2016-10-07 DIAGNOSIS — M5137 Other intervertebral disc degeneration, lumbosacral region: Secondary | ICD-10-CM | POA: Diagnosis not present

## 2016-10-07 DIAGNOSIS — M47817 Spondylosis without myelopathy or radiculopathy, lumbosacral region: Secondary | ICD-10-CM | POA: Diagnosis not present

## 2016-10-08 DIAGNOSIS — N816 Rectocele: Secondary | ICD-10-CM | POA: Diagnosis not present

## 2016-10-08 DIAGNOSIS — N950 Postmenopausal bleeding: Secondary | ICD-10-CM | POA: Diagnosis not present

## 2016-10-08 DIAGNOSIS — N898 Other specified noninflammatory disorders of vagina: Secondary | ICD-10-CM | POA: Diagnosis not present

## 2016-10-08 DIAGNOSIS — N3281 Overactive bladder: Secondary | ICD-10-CM | POA: Diagnosis not present

## 2016-10-08 DIAGNOSIS — N761 Subacute and chronic vaginitis: Secondary | ICD-10-CM | POA: Diagnosis not present

## 2016-10-08 DIAGNOSIS — Z538 Procedure and treatment not carried out for other reasons: Secondary | ICD-10-CM | POA: Diagnosis not present

## 2016-10-08 DIAGNOSIS — N952 Postmenopausal atrophic vaginitis: Secondary | ICD-10-CM | POA: Diagnosis not present

## 2016-10-08 DIAGNOSIS — Z6841 Body Mass Index (BMI) 40.0 and over, adult: Secondary | ICD-10-CM | POA: Diagnosis not present

## 2016-10-12 DIAGNOSIS — N950 Postmenopausal bleeding: Secondary | ICD-10-CM | POA: Diagnosis not present

## 2016-10-14 DIAGNOSIS — M792 Neuralgia and neuritis, unspecified: Secondary | ICD-10-CM | POA: Diagnosis not present

## 2016-10-14 DIAGNOSIS — R140 Abdominal distension (gaseous): Secondary | ICD-10-CM | POA: Diagnosis not present

## 2016-11-19 DIAGNOSIS — T63441A Toxic effect of venom of bees, accidental (unintentional), initial encounter: Secondary | ICD-10-CM | POA: Diagnosis not present

## 2016-11-19 DIAGNOSIS — S20362A Insect bite (nonvenomous) of left front wall of thorax, initial encounter: Secondary | ICD-10-CM | POA: Diagnosis not present

## 2016-11-25 ENCOUNTER — Encounter: Payer: Self-pay | Admitting: Rheumatology

## 2016-11-25 ENCOUNTER — Other Ambulatory Visit: Payer: Self-pay | Admitting: Family Medicine

## 2016-11-25 ENCOUNTER — Ambulatory Visit (INDEPENDENT_AMBULATORY_CARE_PROVIDER_SITE_OTHER): Payer: Medicare Other | Admitting: Rheumatology

## 2016-11-25 VITALS — BP 169/99 | Ht 62.0 in | Wt 307.0 lb

## 2016-11-25 DIAGNOSIS — M19042 Primary osteoarthritis, left hand: Secondary | ICD-10-CM

## 2016-11-25 DIAGNOSIS — Z79899 Other long term (current) drug therapy: Secondary | ICD-10-CM | POA: Diagnosis not present

## 2016-11-25 DIAGNOSIS — M19079 Primary osteoarthritis, unspecified ankle and foot: Secondary | ICD-10-CM

## 2016-11-25 DIAGNOSIS — M5136 Other intervertebral disc degeneration, lumbar region: Secondary | ICD-10-CM | POA: Diagnosis not present

## 2016-11-25 DIAGNOSIS — M170 Bilateral primary osteoarthritis of knee: Secondary | ICD-10-CM

## 2016-11-25 DIAGNOSIS — M19041 Primary osteoarthritis, right hand: Secondary | ICD-10-CM | POA: Diagnosis not present

## 2016-11-25 DIAGNOSIS — M545 Low back pain: Secondary | ICD-10-CM

## 2016-11-25 DIAGNOSIS — R140 Abdominal distension (gaseous): Secondary | ICD-10-CM

## 2016-11-25 DIAGNOSIS — G8929 Other chronic pain: Secondary | ICD-10-CM
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
## 2016-11-26 ENCOUNTER — Other Ambulatory Visit: Payer: Self-pay | Admitting: Rheumatology

## 2016-11-26 DIAGNOSIS — I10 Essential (primary) hypertension: Secondary | ICD-10-CM | POA: Diagnosis not present

## 2016-11-26 NOTE — Telephone Encounter (Signed)
Patient also has a prescription for Baclofen half a tab 3 times a day. Do you want to decrease her dose?

## 2016-11-27 NOTE — Telephone Encounter (Signed)
Yes, it should be 45 tablets per month

## 2016-11-30 NOTE — Telephone Encounter (Signed)
Per Dr. Estanislado Pandy patient already has a prescription for Baclofen TID. Will not refill Methocarbamol.

## 2016-12-01 ENCOUNTER — Other Ambulatory Visit: Payer: Self-pay | Admitting: Rheumatology

## 2016-12-02 ENCOUNTER — Other Ambulatory Visit: Payer: Self-pay | Admitting: *Deleted

## 2016-12-02 MED ORDER — METHOCARBAMOL 500 MG PO TABS: ORAL_TABLET | ORAL | 5 refills | Status: DC

## 2016-12-02 NOTE — Telephone Encounter (Signed)
Per Dr. Estanislado Pandy okay to send in prescription for Methocarbamol. Patient states she is not taking Baclofen. Patient states she has not been for some time.

## 2016-12-03 ENCOUNTER — Other Ambulatory Visit: Payer: Medicare Other

## 2016-12-06 DIAGNOSIS — Z881 Allergy status to other antibiotic agents status: Secondary | ICD-10-CM | POA: Diagnosis not present

## 2016-12-06 DIAGNOSIS — M545 Low back pain: Secondary | ICD-10-CM | POA: Diagnosis not present

## 2016-12-06 DIAGNOSIS — M5432 Sciatica, left side: Secondary | ICD-10-CM | POA: Diagnosis not present

## 2016-12-06 DIAGNOSIS — R600 Localized edema: Secondary | ICD-10-CM | POA: Diagnosis not present

## 2016-12-06 DIAGNOSIS — G8929 Other chronic pain: Secondary | ICD-10-CM | POA: Diagnosis not present

## 2016-12-06 DIAGNOSIS — M48061 Spinal stenosis, lumbar region without neurogenic claudication: Secondary | ICD-10-CM | POA: Diagnosis not present

## 2016-12-06 DIAGNOSIS — M79652 Pain in left thigh: Secondary | ICD-10-CM | POA: Diagnosis not present

## 2016-12-06 DIAGNOSIS — Z880 Allergy status to penicillin: Secondary | ICD-10-CM | POA: Diagnosis not present

## 2016-12-13 ENCOUNTER — Other Ambulatory Visit: Payer: Self-pay | Admitting: *Deleted

## 2016-12-17 ENCOUNTER — Telehealth: Payer: Self-pay | Admitting: Radiology

## 2016-12-17 ENCOUNTER — Other Ambulatory Visit: Payer: Self-pay | Admitting: Rheumatology

## 2016-12-17 DIAGNOSIS — R220 Localized swelling, mass and lump, head: Secondary | ICD-10-CM | POA: Diagnosis not present

## 2016-12-17 DIAGNOSIS — M791 Myalgia: Secondary | ICD-10-CM | POA: Diagnosis not present

## 2016-12-17 DIAGNOSIS — M5432 Sciatica, left side: Secondary | ICD-10-CM | POA: Diagnosis not present

## 2016-12-17 NOTE — Telephone Encounter (Signed)
Refill request received via fax for Celebrex [facility] [address]

## 2016-12-20 DIAGNOSIS — M5416 Radiculopathy, lumbar region: Secondary | ICD-10-CM | POA: Diagnosis not present

## 2016-12-20 DIAGNOSIS — M545 Low back pain: Secondary | ICD-10-CM | POA: Diagnosis not present

## 2016-12-20 DIAGNOSIS — Z6841 Body Mass Index (BMI) 40.0 and over, adult: Secondary | ICD-10-CM | POA: Diagnosis not present

## 2016-12-20 DIAGNOSIS — I10 Essential (primary) hypertension: Secondary | ICD-10-CM | POA: Diagnosis not present

## 2016-12-21 ENCOUNTER — Ambulatory Visit
Admission: RE | Admit: 2016-12-21 | Discharge: 2016-12-21 | Disposition: A | Discharge status: Home / Self Care | Payer: Medicare Other | Source: Ambulatory Visit | Attending: Family Medicine | Admitting: Family Medicine

## 2016-12-21 DIAGNOSIS — R140 Abdominal distension (gaseous): Secondary | ICD-10-CM | POA: Diagnosis not present

## 2016-12-22 DIAGNOSIS — F319 Bipolar disorder, unspecified: Secondary | ICD-10-CM | POA: Diagnosis not present

## 2016-12-23 ENCOUNTER — Ambulatory Visit: Payer: Medicare Other | Admitting: Physical Therapy

## 2016-12-24 ENCOUNTER — Other Ambulatory Visit: Payer: Self-pay | Admitting: Rheumatology

## 2016-12-24 DIAGNOSIS — Z79899 Other long term (current) drug therapy: Secondary | ICD-10-CM

## 2016-12-27 ENCOUNTER — Other Ambulatory Visit: Payer: Self-pay | Admitting: *Deleted

## 2016-12-27 DIAGNOSIS — Z79899 Other long term (current) drug therapy: Secondary | ICD-10-CM | POA: Diagnosis not present

## 2016-12-27 NOTE — Telephone Encounter (Signed)
Last Visit: 11/25/16 Next Visit: 04/19/17 Labs: 04/14/16 Creat. 1.01 Previously 0.67 GFR 58 Previously > 90  Patient will come to update labs.  Patient is requesting a 2 month supply as patient is about to travel for approximately 5 weeks.   Okay to refill Celebrex?

## 2016-12-27 NOTE — Telephone Encounter (Signed)
Patient should discontinue Celebrex due to drop in her GFR. She may use Tylenol instead.

## 2016-12-27 NOTE — Telephone Encounter (Signed)
Patient states she is taking the maximum amount Tylenol she can take per day and Methocarbamol. Patient states she is still in a significant amount of pain and with the travelling she about to do she would like to request to stay on the medication until after she finishes her travelling.

## 2016-12-27 NOTE — Telephone Encounter (Signed)
Ok to give 30 d supply. She needs labs CBC and CMP. Last labs were in 04/2016.

## 2016-12-28 ENCOUNTER — Ambulatory Visit: Payer: Medicare Other | Attending: Student | Admitting: Physical Therapy

## 2016-12-28 DIAGNOSIS — G8929 Other chronic pain: Secondary | ICD-10-CM | POA: Diagnosis not present

## 2016-12-28 DIAGNOSIS — M5416 Radiculopathy, lumbar region: Secondary | ICD-10-CM | POA: Insufficient documentation

## 2016-12-28 DIAGNOSIS — R262 Difficulty in walking, not elsewhere classified: Secondary | ICD-10-CM | POA: Diagnosis not present

## 2016-12-28 DIAGNOSIS — M62830 Muscle spasm of back: Secondary | ICD-10-CM | POA: Diagnosis not present

## 2016-12-28 DIAGNOSIS — M6281 Muscle weakness (generalized): Secondary | ICD-10-CM | POA: Insufficient documentation

## 2016-12-28 DIAGNOSIS — M545 Low back pain: Secondary | ICD-10-CM | POA: Diagnosis not present

## 2016-12-28 LAB — COMPLETE METABOLIC PANEL WITH GFR
ALT: 17 U/L (ref 6–29)
AST: 20 U/L (ref 10–35)
Albumin: 4.2 g/dL (ref 3.6–5.1)
Alkaline Phosphatase: 85 U/L (ref 33–130)
BUN: 15 mg/dL (ref 7–25)
CO2: 23 mmol/L (ref 20–32)
Calcium: 10.5 mg/dL — ABNORMAL HIGH (ref 8.6–10.4)
Chloride: 102 mmol/L (ref 98–110)
Creat: 0.81 mg/dL (ref 0.50–0.99)
GFR, Est African American: 87 mL/min (ref >=60)
GFR, Est Non African American: 75 mL/min (ref >=60)
Glucose, Bld: 119 mg/dL — ABNORMAL HIGH (ref 65–99)
Potassium: 4.9 mmol/L (ref 3.5–5.3)
Sodium: 134 mmol/L — ABNORMAL LOW (ref 135–146)
Total Bilirubin: 0.4 mg/dL (ref 0.2–1.2)
Total Protein: 6.6 g/dL (ref 6.1–8.1)

## 2016-12-28 LAB — CBC WITH DIFFERENTIAL/PLATELET
Basophils Absolute: 0 cells/uL (ref 0–200)
Basophils Relative: 0 %
Eosinophils Absolute: 363 cells/uL (ref 15–500)
Eosinophils Relative: 3 %
HCT: 38.2 % (ref 35.0–45.0)
Hemoglobin: 12.1 g/dL (ref 11.7–15.5)
Lymphocytes Relative: 19 %
Lymphs Abs: 2299 cells/uL (ref 850–3900)
MCH: 30.5 pg (ref 27.0–33.0)
MCHC: 31.7 g/dL — ABNORMAL LOW (ref 32.0–36.0)
MCV: 96.2 fL (ref 80.0–100.0)
MPV: 9.1 fL (ref 7.5–12.5)
Monocytes Absolute: 1210 cells/uL — ABNORMAL HIGH (ref 200–950)
Monocytes Relative: 10 %
Neutro Abs: 8228 cells/uL — ABNORMAL HIGH (ref 1500–7800)
Neutrophils Relative %: 68 %
Platelets: 429 10*3/uL — ABNORMAL HIGH (ref 140–400)
RBC: 3.97 MIL/uL (ref 3.80–5.10)
RDW: 13.5 % (ref 11.0–15.0)
WBC: 12.1 10*3/uL — ABNORMAL HIGH (ref 3.8–10.8)

## 2016-12-28 NOTE — Progress Notes (Signed)
Labs are stable. Elevated WBC count probably secondary to cortisone injections. Okay to refill Celebrex 5

## 2016-12-28 NOTE — Patient Instructions (Signed)
Reissue HEP To GO with modifications: Seated hamstring, lower trunk rotation and core activation Use a lumbar roll Take frequent breaks from sitting in car long periods Moist heat Daily HEP

## 2016-12-28 NOTE — Therapy (Signed)
[place], [place], [address] Phone: [phone]   Fax:  [phone]  Physical Therapy Evaluation  Patient Details  Name: Jessica [name] MRN: [id number] Date of Birth: January 26, 1951 Referring Provider: Glenford Peers, NP   Encounter Date: 12/28/2016      PT End of Session - 12/28/16 1434    Visit Number 1   Number of Visits 12   Date for PT Re-Evaluation 02/22/17   PT Start Time 7001   PT Stop Time 1510   PT Time Calculation (min) 55 min   Activity Tolerance Patient limited by pain;Other (comment)  anxiety, tearful and discouraged, provided emotional support    Behavior During Therapy Anxious;WFL for tasks assessed/performed      Past Medical History:  Diagnosis Date  . Bipolar disorder ([place])   . Hypertension   . Obese   . Sleep apnea 2017  . Thyroid disease     Past Surgical History:  Procedure Laterality Date  . CATARACT EXTRACTION Bilateral 2007  . DILATION AND CURETTAGE OF UTERUS    . LEG SURGERY    . TONSILLECTOMY      There were no vitals filed for this visit.       Subjective Assessment - 12/28/16 1422    Subjective Patient presents with chronic low back pain intermittent and now worsening since she was last seen in 10/2016. She admits to being out of town and inconsistent with her exercies and PT was not of much help.  She has had increased pain in her LLE since being seen in PT.  She has increased LE swelling in bilateral LEs which is new to her.  She has difficulty walking, sitting unless feet fully supported, changing positions and lacks stamina for general mobility.  She has trouble carrying even light items, bending for housework.  She will be traveling in the coming weeks and endorses anxiety about how she will be abel to manage her back, leg pain.    Pertinent History spinal stenosis, arthritis, HTN, LE swelling , sleep apnea, bipolar, thyroid   Limitations  Sitting;Lifting;Standing;Walking;House hold activities  sleep    How long can you sit comfortably? OK if feet fully on the floor    How long can you stand comfortably? <5 min    How long can you walk comfortably? short distances moderate pain    Diagnostic tests MRI in June spinal stenosis, foraminal stenosis, and diffuse arthritic changes, DDD [facility])   Patient Stated Goals Pt would like to be able to walk without pain for 1/2 mile, indoors or outdoors more independently, get out of the house more   Currently in Pain? Yes   Pain Score 4    Pain Location Back   Pain Orientation Lower   Pain Descriptors / Indicators Sore;Aching;Stabbing;Burning   Pain Type Chronic pain   Pain Radiating Towards LLE thigh and occasional to lower leg    Pain Onset More than a month ago   Pain Frequency Constant   Aggravating Factors  activity, standing, extension    Pain Relieving Factors sometimes lying down, sitting down   Effect of Pain on Daily Activities pain intereferes with ADLs, travel                 Objective measurements completed on examination: See above findings.                  PT Education - 12/28/16 2148    Education provided Yes  Education Details See Pt instructions, PT/POC, HEP review, importance of regular HEP for sx mgmt.    Person(s) Educated Patient   Methods Explanation;Demonstration;Tactile cues;Verbal cues;Handout   Comprehension Verbalized understanding;Verbal cues required;Need further instruction          PT Short Term Goals - 12/28/16 2156      PT SHORT TERM GOAL #1   Title Patient will demonstrate a fair core contraction during seated exercise and carry over into standing.    Time 4   Period Weeks   Status New   Target Date 01/25/17     PT SHORT TERM GOAL #2   Title Patient will be independent with initial HEP and strengthening program    Status New   Target Date 01/25/17     PT SHORT TERM GOAL #3   Title Pt will improve  transfers on and off mat with good mechanics and independence.    Time 4   Period Weeks   Status New   Target Date 01/25/17     PT SHORT TERM GOAL #4   Title Pt will stand for 25% of ADLs with pain <4/10 in lumbar spine and LLE   Time 4   Period Weeks   Status New   Target Date 01/25/17     PT SHORT TERM GOAL #5   Title Pt will tolerate functional testing for long term goal assessment (sit to stand, 2 Min walk test)    Time 4   Period Weeks   Status New           PT Long Term Goals - 12/28/16 2159      PT LONG TERM GOAL #1   Title Patient will ambulate 3000' without increased pain in order to go shopping and to walk for recreation    Time 8   Period Weeks   Status New   Target Date 02/22/17     PT LONG TERM GOAL #2   Title Patient will stand for 10 min without increased pain in order to perform ADL's    Time 8   Period Weeks   Status New   Target Date 02/22/17     PT LONG TERM GOAL #3   Title Patient wil be independent with a strengthening program to improve overall health    Time 8   Period Weeks   Status New   Target Date 02/22/17     PT LONG TERM GOAL #4   Title Functional testing goal to be set (balance, endurance)    Time 8   Period Weeks   Status New   Target Date 02/22/17                Plan - 12/28/16 2151    Clinical Impression Statement Patient returns to PT after begin seen earlier this year for similar issue.  Her pain has progressed to radicular into LLE.  Strength is fair, limited more by endurance.  Full examination not completed due to pain and spasm in low back.  She was encouarged to give PT another try and revisit her HEP for maximal functional mobility.     History and Personal Factors relevant to plan of care: morbid obesity, deconditioned, anxiety, bipolar disorder, chronicity and severity of DDD   Clinical Presentation Evolving   Clinical Presentation due to: progression of sx into Lt LE   Clinical Decision Making Moderate    Rehab Potential Fair   Clinical Impairments Affecting Rehab Potential previous non-success with intervention  PT Frequency 2x / week  will be out of town a good deal, may miss 2-3 weeks of PT    PT Duration 8 weeks   PT Treatment/Interventions ADLs/Self Care Home Management;Cryotherapy;Ultrasound;Functional mobility training;Therapeutic activities;Patient/family education;Manual techniques;Therapeutic exercise;Electrical Stimulation;Balance training;DME Instruction;Gait training;Neuromuscular re-education;Passive range of motion;Moist Heat   PT Next Visit Plan check in with HEP, core in sitting if able, postural strength (band review form previous), MHP    PT Home Exercise Plan LTR, seated hamstring and abd. set in sitting    Consulted and Agree with Plan of Care Patient      Patient will benefit from skilled therapeutic intervention in order to improve the following deficits and impairments:  Abnormal gait, Decreased balance, Decreased endurance, Decreased mobility, Difficulty walking, Hypomobility, Obesity, Improper body mechanics, Increased edema, Decreased range of motion, Cardiopulmonary status limiting activity, Decreased activity tolerance, Decreased strength, Increased fascial restricitons, Impaired flexibility, Postural dysfunction, Pain  Visit Diagnosis: Chronic bilateral low back pain without sciatica  Muscle spasm of back  Difficulty in walking, not elsewhere classified  Muscle weakness (generalized)  Radiculopathy, lumbar region      G-Codes - 01/18/[id number]    Functional Assessment Tool Used (Outpatient Only) clinical judgement    Functional Limitation Mobility: Walking and moving around   Mobility: Walking and Moving Around Current Status [phone]) At least 60 percent but less than 80 percent impaired, limited or restricted   Mobility: Walking and Moving Around Goal Status [phone]) At least 40 percent but less than 60 percent impaired, limited or restricted        Problem List Patient Active Problem List   Diagnosis Date Noted  . Hypothyroidism 06/04/2014  . Chest pain 01/23/2013  . ARF (acute renal failure) ([place]) 01/23/2013  . HTN (hypertension) 01/23/2013  . Leukocytosis 01/23/2013  . HLD (hyperlipidemia) 01/23/2013    PAA,JENNIFER 12/29/2016, 9:20 AM  [facility] [address] [place], [place], [address] Phone: [phone]   Fax:  [phone]  Name: Jessica [name] MRN: [id number] Date of Birth: 18-Jul-1950   Raeford Razor, PT 12/29/16 9:21 AM Phone: [phone] Fax: [phone]

## 2016-12-29 DIAGNOSIS — M4727 Other spondylosis with radiculopathy, lumbosacral region: Secondary | ICD-10-CM | POA: Diagnosis not present

## 2016-12-29 DIAGNOSIS — M5416 Radiculopathy, lumbar region: Secondary | ICD-10-CM | POA: Diagnosis not present

## 2017-01-07 ENCOUNTER — Other Ambulatory Visit: Payer: Self-pay | Admitting: *Deleted

## 2017-01-07 MED ORDER — METAXALONE 800 MG PO TABS: 800.0000 mg | ORAL_TABLET | Freq: Three times a day (TID) | ORAL | 0 refills | Status: DC

## 2017-01-07 NOTE — Telephone Encounter (Signed)
Fax received stating Robaxin is on back order. Requesting a change to medication. Per Dr. Estanislado Pandy okay to change to Skelaxin.

## 2017-01-11 ENCOUNTER — Ambulatory Visit: Payer: Medicare Other | Attending: Student | Admitting: Physical Therapy

## 2017-01-11 DIAGNOSIS — M62830 Muscle spasm of back: Secondary | ICD-10-CM | POA: Insufficient documentation

## 2017-01-11 DIAGNOSIS — M5416 Radiculopathy, lumbar region: Secondary | ICD-10-CM | POA: Insufficient documentation

## 2017-01-11 DIAGNOSIS — R262 Difficulty in walking, not elsewhere classified: Secondary | ICD-10-CM | POA: Insufficient documentation

## 2017-01-11 DIAGNOSIS — G8929 Other chronic pain: Secondary | ICD-10-CM | POA: Insufficient documentation

## 2017-01-11 DIAGNOSIS — M545 Low back pain: Secondary | ICD-10-CM | POA: Diagnosis not present

## 2017-01-11 DIAGNOSIS — M6281 Muscle weakness (generalized): Secondary | ICD-10-CM

## 2017-01-12 NOTE — Therapy (Addendum)
[place], [place], [address] Phone: [phone]   Fax:  [phone]  Physical Therapy Treatment/ Discharge   Patient Details  Name: Jessica [name] MRN: [id number] Date of Birth: 1951-04-06 Referring Provider: Glenford Peers, NP   Encounter Date: 01/11/2017      PT End of Session - 01/12/17 1128    Visit Number 2   Number of Visits 12   Date for PT Re-Evaluation 02/22/17   PT Start Time 1022   PT Stop Time 1100   PT Time Calculation (min) 38 min   Activity Tolerance Patient tolerated treatment well   Behavior During Therapy [facility] for tasks assessed/performed      Past Medical History:  Diagnosis Date  . Bipolar disorder ([place])   . Hypertension   . Obese   . Sleep apnea 2017  . Thyroid disease     Past Surgical History:  Procedure Laterality Date  . CATARACT EXTRACTION Bilateral 2007  . DILATION AND CURETTAGE OF UTERUS    . LEG SURGERY    . TONSILLECTOMY      There were no vitals filed for this visit.      Subjective Assessment - 01/12/17 0831    Subjective Patient continues to report near 10/10 pain in her leg. She has been on a cruise so she has not been able to do her exercises. she has been walking as much as she can.    Pertinent History spinal stenosis, arthritis, HTN, LE swelling , sleep apnea, bipolar, thyroid   Limitations Sitting;Lifting;Standing;Walking;House hold activities   How long can you sit comfortably? OK if feet fully on the floor    How long can you stand comfortably? <5 min    How long can you walk comfortably? short distances moderate pain    Diagnostic tests MRI in June spinal stenosis, foraminal stenosis, and diffuse arthritic changes, DDD [facility])   Patient Stated Goals Pt would like to be able to walk without pain for 1/2 mile, indoors or outdoors more independently, get out of the house more   Currently in Pain? Yes   Pain Score 7    Pain Location Back   Pain Orientation Lower   Pain Descriptors / Indicators Sore;Aching;Stabbing;Burning   Pain Type Chronic pain   Pain Radiating Towards left thigh    Pain Onset More than a month ago                         [facility] Adult PT Treatment/Exercise - 01/12/17 0001      Self-Care   Self-Care Posture   Posture reviewed the improtance of posture     Lumbar Exercises: Seated   Other Seated Lumbar Exercises bilateral ER with max cuing for posture and abdominal contraction; Bilateral horizontal abduction 2x5    Other Seated Lumbar Exercises setaed therapy ball roll out with max cuing 2x5 forward and lateral     Manual Therapy   Manual therapy comments gentle left leg LAD with time taken to get patient in a comfortable position on the wedge.                 PT Education - 01/12/17 1128    Education provided Yes   Education Details reviewed the improtance of posture    Person(s) Educated Patient   Methods Explanation;Demonstration;Tactile cues;Verbal cues;Handout   Comprehension Verbalized understanding;Returned demonstration;Verbal cues required          PT Short Term  Goals - 01/12/17 1133      PT SHORT TERM GOAL #1   Title Patient will demonstrate a fair core contraction during seated exercise and carry over into standing.    Baseline max cuing but unable to tell if patient was trying to contract her abdominals    Time 4   Period Weeks   Status On-going     PT SHORT TERM GOAL #2   Title Patient will be independent with initial HEP and strengthening program    Baseline trying to do exercises   Time 4   Period Weeks   Status On-going     PT SHORT TERM GOAL #3   Title Pt will improve transfers on and off mat with good mechanics and independence.    Time 4   Period Weeks   Status On-going     PT SHORT TERM GOAL #4   Title Pt will stand for 25% of ADLs with pain <4/10 in lumbar spine and LLE   Baseline 164 ft prior to seated break due to pain   Time 4    Period Weeks   Status On-going     PT SHORT TERM GOAL #5   Title Pt will tolerate functional testing for long term goal assessment (sit to stand, 2 Min walk test)    Time 4   Period Weeks   Status On-going           PT Long Term Goals - 12/28/16 2159      PT LONG TERM GOAL #1   Title Patient will ambulate 3000' without increased pain in order to go shopping and to walk for recreation    Time 8   Period Weeks   Status New   Target Date 02/22/17     PT LONG TERM GOAL #2   Title Patient will stand for 10 min without increased pain in order to perform ADL's    Time 8   Period Weeks   Status New   Target Date 02/22/17     PT LONG TERM GOAL #3   Title Patient wil be independent with a strengthening program to improve overall health    Time 8   Period Weeks   Status New   Target Date 02/22/17     PT LONG TERM GOAL #4   Title Functional testing goal to be set (balance, endurance)    Time 8   Period Weeks   Status New   Target Date 02/22/17               Plan - 01/12/17 1131    Clinical Impression Statement Patient had difficulty with all exercises. She has to lean back in sitting 2nd to large habitus. This is likely putting pessure on her back. Therapy talked about tightening her stomach muscles and improving her posture but not sure if there will be any caryover. She has 1 more visit scheduled. Like last episode if she decides she wants to work on things she has potential to improve. She is very reluctant to do even light activity.,    Clinical Presentation Evolving   Clinical Decision Making Moderate   Rehab Potential Poor   Clinical Impairments Affecting Rehab Potential previous non-success with intervention   PT Frequency 2x / week   PT Duration 8 weeks   PT Treatment/Interventions ADLs/Self Care Home Management;Cryotherapy;Ultrasound;Functional mobility training;Therapeutic activities;Patient/family education;Manual techniques;Therapeutic exercise;Electrical  Stimulation;Balance training;DME Instruction;Gait training;Neuromuscular re-education;Passive range of motion;Moist Heat   PT Next Visit Plan check in  with HEP, core in sitting if able, postural strength (band review form previous), MHP    PT Home Exercise Plan LTR, seated hamstring and abd. set in sitting    Consulted and Agree with Plan of Care Patient      Patient will benefit from skilled therapeutic intervention in order to improve the following deficits and impairments:  Abnormal gait, Decreased balance, Decreased endurance, Decreased mobility, Difficulty walking, Hypomobility, Obesity, Improper body mechanics, Increased edema, Decreased range of motion, Cardiopulmonary status limiting activity, Decreased activity tolerance, Decreased strength, Increased fascial restricitons, Impaired flexibility, Postural dysfunction, Pain  Visit Diagnosis: Chronic bilateral low back pain without sciatica  Muscle spasm of back  Difficulty in walking, not elsewhere classified  Muscle weakness (generalized)  Radiculopathy, lumbar region  PHYSICAL THERAPY DISCHARGE SUMMARY  Visits from Start of Care: 2  Current functional level related to goals / functional outcomes: Did not come for further visits    Remaining deficits: Unknown    Education / Equipment: Unknown  Plan: Patient agrees to discharge.  Patient goals were not met. Patient is being discharged due to not returning since the last visit.  ?????       Problem List Patient Active Problem List   Diagnosis Date Noted  . Hypothyroidism 06/04/2014  . Chest pain 01/23/2013  . ARF (acute renal failure) ([place]) 01/23/2013  . HTN (hypertension) 01/23/2013  . Leukocytosis 01/23/2013  . HLD (hyperlipidemia) 01/23/2013    Carney Living PT DPT  01/12/2017, 11:35 AM  [facility] [address] [place], [place], [address] Phone: [phone]   Fax:  [phone]  Name: Jessica [name] MRN: [id number] Date of Birth: Sep 04, 1950

## 2017-01-13 ENCOUNTER — Ambulatory Visit: Payer: Medicare Other | Admitting: Physical Therapy

## 2017-01-13 ENCOUNTER — Encounter: Payer: Medicare Other | Admitting: Physical Therapy

## 2017-01-18 ENCOUNTER — Other Ambulatory Visit: Payer: Self-pay | Admitting: Rheumatology

## 2017-01-18 DIAGNOSIS — M545 Low back pain: Secondary | ICD-10-CM | POA: Diagnosis not present

## 2017-01-18 DIAGNOSIS — D485 Neoplasm of uncertain behavior of skin: Secondary | ICD-10-CM | POA: Diagnosis not present

## 2017-01-18 DIAGNOSIS — D489 Neoplasm of uncertain behavior, unspecified: Secondary | ICD-10-CM | POA: Diagnosis not present

## 2017-01-19 DIAGNOSIS — M47896 Other spondylosis, lumbar region: Secondary | ICD-10-CM | POA: Diagnosis not present

## 2017-01-19 DIAGNOSIS — M5136 Other intervertebral disc degeneration, lumbar region: Secondary | ICD-10-CM | POA: Diagnosis not present

## 2017-01-19 DIAGNOSIS — M549 Dorsalgia, unspecified: Secondary | ICD-10-CM | POA: Diagnosis not present

## 2017-01-19 DIAGNOSIS — M545 Low back pain: Secondary | ICD-10-CM | POA: Diagnosis not present

## 2017-01-19 DIAGNOSIS — M5416 Radiculopathy, lumbar region: Secondary | ICD-10-CM | POA: Diagnosis not present

## 2017-01-26 ENCOUNTER — Encounter (HOSPITAL_COMMUNITY): Admission: RE | Payer: Self-pay | Source: Ambulatory Visit

## 2017-01-26 ENCOUNTER — Ambulatory Visit (HOSPITAL_COMMUNITY)
Admission: RE | Admit: 2017-01-26 | Payer: Medicare Other | Source: Ambulatory Visit | Admitting: Obstetrics & Gynecology

## 2017-01-26 SURGERY — DILATATION AND CURETTAGE /HYSTEROSCOPY
Anesthesia: Choice

## 2017-02-09 DIAGNOSIS — F3175 Bipolar disorder, in partial remission, most recent episode depressed: Secondary | ICD-10-CM | POA: Diagnosis not present

## 2017-02-10 DIAGNOSIS — M5136 Other intervertebral disc degeneration, lumbar region: Secondary | ICD-10-CM | POA: Diagnosis not present

## 2017-02-10 DIAGNOSIS — F319 Bipolar disorder, unspecified: Secondary | ICD-10-CM | POA: Diagnosis not present

## 2017-02-10 DIAGNOSIS — M545 Low back pain: Secondary | ICD-10-CM | POA: Diagnosis not present

## 2017-02-10 DIAGNOSIS — M4316 Spondylolisthesis, lumbar region: Secondary | ICD-10-CM | POA: Diagnosis not present

## 2017-02-10 DIAGNOSIS — M47816 Spondylosis without myelopathy or radiculopathy, lumbar region: Secondary | ICD-10-CM | POA: Diagnosis not present

## 2017-02-11 DIAGNOSIS — M545 Low back pain: Secondary | ICD-10-CM | POA: Diagnosis not present

## 2017-02-11 DIAGNOSIS — R6884 Jaw pain: Secondary | ICD-10-CM | POA: Diagnosis not present

## 2017-02-11 DIAGNOSIS — R300 Dysuria: Secondary | ICD-10-CM | POA: Diagnosis not present

## 2017-02-11 DIAGNOSIS — N3000 Acute cystitis without hematuria: Secondary | ICD-10-CM | POA: Diagnosis not present

## 2017-02-22 DIAGNOSIS — H04123 Dry eye syndrome of bilateral lacrimal glands: Secondary | ICD-10-CM | POA: Diagnosis not present

## 2017-02-22 DIAGNOSIS — H52223 Regular astigmatism, bilateral: Secondary | ICD-10-CM | POA: Diagnosis not present

## 2017-02-22 DIAGNOSIS — H524 Presbyopia: Secondary | ICD-10-CM | POA: Diagnosis not present

## 2017-02-22 DIAGNOSIS — H353131 Nonexudative age-related macular degeneration, bilateral, early dry stage: Secondary | ICD-10-CM | POA: Diagnosis not present

## 2017-03-02 DIAGNOSIS — M47816 Spondylosis without myelopathy or radiculopathy, lumbar region: Secondary | ICD-10-CM | POA: Diagnosis not present

## 2017-03-07 NOTE — Patient Instructions (Addendum)
Your procedure is scheduled on:  Friday, Nov 9  Enter through the Main Entrance of [facility] at: 9:45 am  Pick up the phone at the desk and dial [phone].  Call this number if you have problems the morning of surgery: [phone].  Remember: Do NOT eat food or drink clear liquids (including water) after midnight Thursday  Take these medicines the morning of surgery with a SIP OF WATER:  Celebrex, synthroid, lithium, robaxin, tylenol.  Ok to use eye drops if needed on day of surgery.  Do NOT wear jewelry (body piercing), metal hair clips/bobby pins, make-up, or nail polish. Do NOT wear lotions, powders, or perfumes.  You may wear deoderant. Do NOT shave for 48 hours prior to surgery. Do NOT bring valuables to the hospital.  Patient does not want to use surgical soap.   Patient will use CHG wipes on day of surgery.  Patient to bring CPAP machine with her on day of surgery. Have a responsible adult drive you home and stay with you for 24 hours after your procedure.  Home with Husband [name] cell [phone].

## 2017-03-08 ENCOUNTER — Encounter (HOSPITAL_COMMUNITY): Payer: Self-pay

## 2017-03-08 ENCOUNTER — Encounter (HOSPITAL_COMMUNITY)
Admission: RE | Admit: 2017-03-08 | Discharge: 2017-03-08 | Disposition: A | Discharge status: Home / Self Care | Payer: Medicare Other | Source: Ambulatory Visit | Attending: Obstetrics & Gynecology | Admitting: Obstetrics & Gynecology

## 2017-03-08 ENCOUNTER — Other Ambulatory Visit: Payer: Self-pay

## 2017-03-08 DIAGNOSIS — Z01812 Encounter for preprocedural laboratory examination: Secondary | ICD-10-CM | POA: Diagnosis not present

## 2017-03-08 DIAGNOSIS — E039 Hypothyroidism, unspecified: Secondary | ICD-10-CM | POA: Insufficient documentation

## 2017-03-08 DIAGNOSIS — E785 Hyperlipidemia, unspecified: Secondary | ICD-10-CM | POA: Insufficient documentation

## 2017-03-08 DIAGNOSIS — I10 Essential (primary) hypertension: Secondary | ICD-10-CM | POA: Insufficient documentation

## 2017-03-08 DIAGNOSIS — N179 Acute kidney failure, unspecified: Secondary | ICD-10-CM | POA: Insufficient documentation

## 2017-03-08 DIAGNOSIS — D72829 Elevated white blood cell count, unspecified: Secondary | ICD-10-CM | POA: Diagnosis not present

## 2017-03-08 HISTORY — DX: Gastro-esophageal reflux disease without esophagitis: K21.9

## 2017-03-08 HISTORY — DX: Major depressive disorder, single episode, unspecified: F32.9

## 2017-03-08 HISTORY — DX: Depression, unspecified: F32.A

## 2017-03-08 HISTORY — DX: Headache: R51

## 2017-03-08 HISTORY — DX: Headache, unspecified: R51.9

## 2017-03-08 HISTORY — DX: Hyperlipidemia, unspecified: E78.5

## 2017-03-08 HISTORY — DX: Hypothyroidism, unspecified: E03.9

## 2017-03-08 HISTORY — DX: Unspecified osteoarthritis, unspecified site: M19.90

## 2017-03-08 HISTORY — DX: Personal history of other diseases of the digestive system: Z87.19

## 2017-03-08 HISTORY — DX: Dyspnea, unspecified: R06.00

## 2017-03-08 LAB — BASIC METABOLIC PANEL
Anion gap: 6 (ref 5–15)
BUN: 17 mg/dL (ref 6–20)
CO2: 25 mmol/L (ref 22–32)
Calcium: 10 mg/dL (ref 8.9–10.3)
Chloride: 104 mmol/L (ref 101–111)
Creatinine, Ser: 0.77 mg/dL (ref 0.44–1.00)
GFR calc Af Amer: >60 mL/min (ref >60)
GFR calc non Af Amer: >60 mL/min (ref >60)
Glucose, Bld: 135 mg/dL — ABNORMAL HIGH (ref 65–99)
Potassium: 3.9 mmol/L (ref 3.5–5.1)
Sodium: 135 mmol/L (ref 135–145)

## 2017-03-08 LAB — CBC
HCT: 38.2 % (ref 36.0–46.0)
Hemoglobin: 11.8 g/dL — ABNORMAL LOW (ref 12.0–15.0)
MCH: 30.5 pg (ref 26.0–34.0)
MCHC: 30.9 g/dL (ref 30.0–36.0)
MCV: 98.7 fL (ref 78.0–100.0)
Platelets: 381 10*3/uL (ref 150–400)
RBC: 3.87 MIL/uL (ref 3.87–5.11)
RDW: 14.3 % (ref 11.5–15.5)
WBC: 11.8 10*3/uL — ABNORMAL HIGH (ref 4.0–10.5)

## 2017-03-08 NOTE — Pre-Procedure Instructions (Signed)
Patient informed via phone that her lab results from today's PAT were ok and to bring CPAP machine with her on day of surgery.  Patient verbalized understanding.

## 2017-03-09 ENCOUNTER — Other Ambulatory Visit (HOSPITAL_COMMUNITY): Payer: Medicare Other

## 2017-03-09 DIAGNOSIS — N950 Postmenopausal bleeding: Secondary | ICD-10-CM | POA: Diagnosis not present

## 2017-03-09 DIAGNOSIS — Z6841 Body Mass Index (BMI) 40.0 and over, adult: Secondary | ICD-10-CM | POA: Diagnosis not present

## 2017-03-09 DIAGNOSIS — R300 Dysuria: Secondary | ICD-10-CM | POA: Diagnosis not present

## 2017-03-09 DIAGNOSIS — N816 Rectocele: Secondary | ICD-10-CM | POA: Diagnosis not present

## 2017-03-09 DIAGNOSIS — N952 Postmenopausal atrophic vaginitis: Secondary | ICD-10-CM | POA: Diagnosis not present

## 2017-03-09 NOTE — H&P (Signed)
66yo PM female who presents for hysteroscopy, D&C due to postmenopausal bleeding. In review, she was initially seen in June where she reported that in March she had intermittent episodes of vaginal bleeding. Bleeding is intermittent and is mostly a tablespoon or less. Not sure what makes it better or worse- occurs spontaneously. She states that she has probably had two additional episodes of spotting. It usually is only a small amount and last just that day. Denies abnormal discharge, itching or irritation. Denies pelvic or abdominal pain. Unfortunately due to pt discomfort and obesity, examination in office was difficult and inadequate. EMB was not able to be completed in the office. [place] completed 10/2016: 6.6cm uterus with questionable thickened endometrium. Difficult to assess due to body habitus.    Current Medications  Taking   Amlodipine Besylate 5 MG Tablet 1 tablet once a day for blood pressure   Pravastatin Sodium 20 MG Tablet 1 tablet Orally Once a day   Synthroid(L-Thyroxine Sodium) 200 MCG Tablet 1 tablet every morning on an empty stomach Orally Once a day   Furosemide 20 MG Tablet 1 tablet Orally Once a day as needed for swelling   Vyvanse(Lisdexamfetamine Dimesylate) 60 MG Tablet Chewable 1 capsule in the morning Orally Once a day   Myrbetriq(Mirabegron ER) 50 MG Tablet Extended Release 24 Hour 1 tablet Orally Once a day   Citalopram Hydrobromide 30 mg Tablet 1 1/2 tablets Orally Once a day (Dr. Clovis Pu)   Lithium Carbonate 300 MG Tablet Extended Release 1 tablet Orally 1 am/2 pm (Dr. Clovis Pu)   Lorazepam 0.5 MG Tablet 1/2-1 tablet Orally twice a day as needed, Notes: PRN   Celebrex(Celecoxib) 200 MG Capsule 1 capsule Orally Once a day (rx by Dr. Estanislado Pandy)   Restasis(CycloSPORINE) 0.05 % Emulsion 1 drop each eye Ophthalmic twice a day   Nac 600(Acetylcysteine) 600 MG Capsule 1 capsule with a meal Orally twice a day   Tylenol Arthritis Pain(Acetaminophen ER) 650 MG Tablet Extended Release  2 tablets Orally 3 times a day   Retaine Vision - Capsule Orally   PreserVision AREDS - Tablet Orally   Multivitamins Tablet one tablet Orally once a day   Vitamin D 2000 units Tablet 1 tablet Orally once a day   Hydrocodone-Acetaminophen 5-325 MG Tablet 1 tablet Orally every 6 hrs as needed, Notes: PRN   Methocarbamol 750 MG Tablet 1 tablet Orally twice a day as needed for spasm              Past Medical History  Hypertension.   Hypercholesterolemia.   Hypothyroidism.   Bipolar disorder - Dr. Clovis Pu.   Osteoarthritis.   SIADH - decreased sodium (2007).   Lupus (early 20's).   Obstructive sleep apnea ((PSG 07/25/13, AHI 72/hr, CPAP 9 with AHI 0/hr) - [facility].   Overactive bladder - Dr. Johnny Bridge).           Surgical History  right carpal tunnel 1988  Leg fracture 1997  Left and right cataract   Foot Surgery 05/2008  Tonsillectomy   D&C    Family History  Father: deceased, bipolar, diagnosed with Diabetes, Coronary artery disease  Mother: deceased, Hypothyroidism, diagnosed with Breast cancer in 60  Paternal [place] Father: deceased, Pneumonia  Paternal [place] Mother: deceased, diagnosed with Hypertension, CVA  Maternal Grand Father: deceased, diagnosed with Coronary artery disease  Maternal Grand Mother: deceased, Natural causes  Son(s): Idiopathic Thrombocytopenia [place]  NO SIBLINGS.   Social History  General:  Tobacco use  cigarettes: Never smoked Tobacco history  last updated 03/09/2017 no EXPOSURE TO PASSIVE SMOKE.  Alcohol: social.  Caffeine: 4-5 servings per day, soda.  no Recreational drug use.  DIET: low fat.  Exercise: nothing structured, walks occasionally.  Marital Status: married.  Children: 2.  OCCUPATION: retired.    Gyn History  Sexual activity not currently sexually active.  Periods : postmenopausal.  Denies H/O LMP.  Denies H/O Birth control.  Last pap smear date 2016 Negative.  Last mammogram date 07/27/2016.  Denies H/O  Abnormal pap smear.  Denies H/O STD.  Menarche 89.    OB History  Number of pregnancies 2.  Pregnancy # 1 live birth, vaginal delivery.  Pregnancy # 2 live birth, vaginal delivery.    Allergies  Erythromycin: nausea: Side Effects  advil  Augmentin: N/V: Side Effects  Nabumetone: GI upset: Side Effects  Promethazine-Codeine: Feels bad: Side Effects   Hospitalization/Major Diagnostic Procedure  Psychiatric   Reaction to drug   Childbirth x 2    Review of Systems  CONSTITUTIONAL:  no Chills. no Fever. Night sweats yes.  HEENT:  Blurrred vision no. no Double vision.  CARDIOLOGY:  no Chest pain.  RESPIRATORY:  no Shortness of breath. no Cough.  UROLOGY:  Urinary urgency yes. Urinary frequency yes. Urinary incontinence yes.  GASTROENTEROLOGY:  no Abdominal pain. no Appetite change. no Change in bowel movements. no Dark tarry stools. no Diarrhea.  FEMALE REPRODUCTIVE:  no Breast lumps or discharge. no Breast pain.  NEUROLOGY:  no Dizziness. Headache yes. no Loss of consciousness.  PSYCHOLOGY:  no Anxiety. no Depression.  SKIN:  no Rash. no Hives.  HEMATOLOGY/LYMPH:  no Anemia. Fatigue yes. Using Blood Thinners no.     Examination Vital Signs  Wt 311, Wt change 4 lb, Ht 62, BMI 56.88, Pulse sitting 93, BP sitting 116/88.   Examination  General Examination: GENERAL APPEARANCE well developed, well nourished .  SKIN: warm and dry, no rashes .  NECK: supple, normal appearance .  LUNGS: clear to auscultation bilaterally, no wheezes, rhonchi, rales.  HEART: no murmurs, regular rate and rhythm.  ABDOMEN: morbidly obese, soft and non-tender.  FEMALE GENITOURINARY: deferred .  MUSCULOSKELETAL no calf tenderness bilaterally, using walker, limited mobility.  EXTREMITIES: 2+ non-pitting edema noted .  PSYCH: appropriate mood and affect .      A/P: 66yo PM female who presents for hysteroscopy, D&C, possible polypectomy/intervention due to postmenopausal bleeding -NPO -LR  @ 125cc/hr -SCDs to OR -Risk/benefit and alternatives of surgical management reviewed with patient.  Discussed potential risks including but not limited to risk of bleeding, infection, uterine perforation and injury.  Questions and concerns were addressed and pt wishes to proceed.  Janyth Pupa, DO [phone] (cell) [phone] (office)

## 2017-03-11 ENCOUNTER — Ambulatory Visit (HOSPITAL_COMMUNITY)
Admission: RE | Admit: 2017-03-11 | Payer: Medicare Other | Source: Ambulatory Visit | Admitting: Obstetrics & Gynecology

## 2017-03-11 ENCOUNTER — Encounter (HOSPITAL_COMMUNITY): Admission: RE | Payer: Self-pay | Source: Ambulatory Visit

## 2017-03-11 SURGERY — DILATATION AND CURETTAGE /HYSTEROSCOPY
Anesthesia: Choice

## 2017-03-16 DIAGNOSIS — H353132 Nonexudative age-related macular degeneration, bilateral, intermediate dry stage: Secondary | ICD-10-CM | POA: Diagnosis not present

## 2017-03-16 DIAGNOSIS — H43393 Other vitreous opacities, bilateral: Secondary | ICD-10-CM | POA: Diagnosis not present

## 2017-03-16 DIAGNOSIS — H43813 Vitreous degeneration, bilateral: Secondary | ICD-10-CM | POA: Diagnosis not present

## 2017-03-17 DIAGNOSIS — B354 Tinea corporis: Secondary | ICD-10-CM | POA: Diagnosis not present

## 2017-03-28 DIAGNOSIS — M47816 Spondylosis without myelopathy or radiculopathy, lumbar region: Secondary | ICD-10-CM | POA: Diagnosis not present

## 2017-03-30 DIAGNOSIS — F319 Bipolar disorder, unspecified: Secondary | ICD-10-CM | POA: Diagnosis not present

## 2017-04-06 NOTE — Progress Notes (Signed)
Office Visit Note  Patient: Jessica [name]             Date of Birth: 06/23/1950           MRN: [id number]             PCP: Aurea Graff.Marlou Sa, MD Referring: Alroy Dust, Carlean Jews.Marlou Sa, MD Visit Date: 04/19/2017 Occupation: @GUAROCC @    Subjective:  Lower back pain and knee pain   History of Present Illness: Jessica [name] is a 66 y.o. female with history of osteoarthritis and disc disease. She states she went to travel and was in a cruise for total of 3 weeks. She states when she came back she had a lot of difficulty walking and doing routine activities. She's been using a walker since then. She complains of a lot of lower back pain and some numbness in her lower extremities. She states her right foot feels heavier. She also has been having a lateral knee joint discomfort. Her hands continue to hurt.  Activities of Daily Living Patient reports morning stiffness for 3 hours.   Patient Denies nocturnal pain.  Difficulty dressing/grooming: Reports Difficulty climbing stairs: denies Difficulty getting out of chair: Reports Difficulty using hands for taps, buttons, cutlery, and/or writing: Reports   Review of Systems  Constitutional: Positive for fatigue. Negative for fever, night sweats, weight gain, weight loss and weakness.  HENT: Negative for mouth sores, trouble swallowing, trouble swallowing, mouth dryness and nose dryness.   Eyes: Negative for pain, redness, visual disturbance and dryness.  Respiratory: Positive for shortness of breath. Negative for cough, apnea and difficulty breathing.   Cardiovascular: Negative.  Positive for hypertension. Negative for chest pain, palpitations, irregular heartbeat and swelling in legs/feet.  Gastrointestinal: Negative.  Negative for blood in stool, constipation and diarrhea.  Endocrine: Negative.  Negative for increased urination.  Genitourinary: Positive for nocturia. Negative for vaginal dryness.  Musculoskeletal: Positive for arthralgias,  joint pain and morning stiffness. Negative for joint swelling, myalgias, muscle weakness, muscle tenderness and myalgias.  Skin: Negative for color change, rash, hair loss, skin tightness, ulcers and sensitivity to sunlight.  Allergic/Immunologic: Negative for susceptible to infections.  Neurological: Positive for numbness and memory loss. Negative for dizziness, headaches and night sweats.  Hematological: Negative.  Negative for swollen glands.  Psychiatric/Behavioral: Positive for depressed mood and sleep disturbance. The patient is nervous/anxious.     PMFS History:  Patient Active Problem List   Diagnosis Date Noted  . Primary osteoarthritis of both hands 04/19/2017  . Primary osteoarthritis of both knees 04/19/2017  . Primary osteoarthritis of both feet 04/19/2017  . DDD (degenerative disc disease), lumbar 04/19/2017  . History of bipolar disorder 04/19/2017  . Hypothyroidism 06/04/2014  . Chest pain 01/23/2013  . ARF (acute renal failure) ([place]) 01/23/2013  . HTN (hypertension) 01/23/2013  . Leukocytosis 01/23/2013  . HLD (hyperlipidemia) 01/23/2013    Past Medical History:  Diagnosis Date  . Arthritis    lower back, hands, knees  . Bipolar disorder ([place])   . Depression   . Dyspnea    with exertion  . GERD (gastroesophageal reflux disease)    patient unsure about this dx - no meds  . Headache   . History of IBS    watches diet  . Hyperlipidemia   . Hypertension   . Hypothyroidism   . Obese   . Sleep apnea 2017   uses CPAP   . SVD (spontaneous vaginal delivery)    x 2  . Thyroid disease  Family History  Problem Relation Age of Onset  . Thyroid disease Mother   . Breast cancer Mother   . Heart disease Mother   . Bipolar disorder Father   . Diabetes Father   . Heart disease Father   . Dementia Father    Past Surgical History:  Procedure Laterality Date  . carpel tunnel surgery Right   . CATARACT EXTRACTION Bilateral 2007   w/ lens implants  .  COLONOSCOPY    . DILATION AND CURETTAGE OF UTERUS    . FOOT SURGERY Right    hammer toe  . LEG SURGERY Right   . TONSILLECTOMY    . WISDOM TOOTH EXTRACTION     Social History   Social History Narrative  . Not on file     Objective: Vital Signs: BP 129/64   Pulse (!) 102   Resp 18   Ht 5\' 2"  (1.575 m)   Wt (!) 312 lb (141.5 kg)   BMI 57.07 kg/m    Physical Exam  Constitutional: She is oriented to person, place, and time. She appears well-developed and well-nourished.  HENT:  Head: Normocephalic and atraumatic.  Eyes: Conjunctivae and EOM are normal.  Neck: Normal range of motion.  Cardiovascular: Normal rate, regular rhythm, normal heart sounds and intact distal pulses.  Bilateral lower extremity pedal edema was noted.  Pulmonary/Chest: Effort normal and breath sounds normal.  Abdominal: Soft. Bowel sounds are normal.  Lymphadenopathy:    She has no cervical adenopathy.  Neurological: She is alert and oriented to person, place, and time.  Using a walker  Skin: Skin is warm and dry. Capillary refill takes less than 2 seconds.  Psychiatric: She has a normal mood and affect. Her behavior is normal.  Nursing note and vitals reviewed.    Musculoskeletal Exam: C-spine good range of motion. She has limited range of motion of her thoracic and lumbar spine. She's been use a walker for mobility. Shoulder joints elbow joints wrist joints are good range of motion. She has mild DIP PIP thickening with no swelling. Hip joints were difficult to assess in the sitting position. Knee joints are good range of motion without any warmth swelling or effusion. She has bilateral lower extremity pitting edema. No ankle joint MTP swelling was noted. She is some thickening of PIP/DIP joints in her feet.  CDAI Exam: No CDAI exam completed.    Investigation: No additional findings. CBC Latest Ref Rng & Units 03/08/2017 12/27/2016 04/14/2016  WBC 4.0 - 10.5 K/uL 11.8(H) 12.1(H) 12.0(H)    Hemoglobin 12.0 - 15.0 g/dL 11.8(L) 12.1 12.1  Hematocrit 36.0 - 46.0 % 38.2 38.2 37.9  Platelets 150 - 400 K/uL 381 429(H) 306   CMP Latest Ref Rng & Units 03/08/2017 12/27/2016 04/14/2016  Glucose 65 - 99 mg/dL 135(H) 119(H) 88  BUN 6 - 20 mg/dL 17 15 14   Creatinine 0.44 - 1.00 mg/dL 0.77 0.81 1.01(H)  Sodium 135 - 145 mmol/L 135 134(L) 137  Potassium 3.5 - 5.1 mmol/L 3.9 4.9 5.1  Chloride 101 - 111 mmol/L 104 102 103  CO2 22 - 32 mmol/L 25 23 22   Calcium 8.9 - 10.3 mg/dL 10.0 10.5(H) 9.7  Total Protein 6.1 - 8.1 g/dL - 6.6 6.6  Total Bilirubin 0.2 - 1.2 mg/dL - 0.4 0.2  Alkaline Phos 33 - 130 U/L - 85 91  AST 10 - 35 U/L - 20 21  ALT 6 - 29 U/L - 17 16    Imaging: No results found.  Speciality Comments: No specialty comments available.    Procedures:  No procedures performed Allergies: Advil [ibuprofen]; Erythromycin base; and Promethazine-codeine   Assessment / Plan:     Visit Diagnoses: Primary osteoarthritis of both hands: She has stiffness and discomfort in her bilateral hands due to underlying osteoarthritis but no swelling was noted.  Primary osteoarthritis of both knees - chondromalacia patella . She is ongoing pain in her bilateral knee joints which is worse after traveling. On Celebrex 200 mg po bid  Primary osteoarthritis of both feet: She is a stiffness in her bilateral feet but no swelling was noted.  DDD (degenerative disc disease), lumbar: She's been having increased lower back pain since her visit and some right lower extremity numbness intermittently. [facility] She states she had recent MRI and I do not have results available.  Medication monitoring: She is on Celebrex 200 mg twice a day when necessary. 7 advised her to get labs every 6 months basis which will include CBC and CMP with GFR.Labs will be due in February.  Pedal edema: She's had significant pedal edema. I advised compression socks and advised her to follow  up with her PCP.  Other medical problems are listed as follows:  History of hypothyroidism  History of bipolar disorder  History of IBS  History of hypertension  History of urinary incontinence     Orders: Orders Placed This Encounter  Procedures  . CBC with Differential/Platelet  . COMPLETE METABOLIC PANEL WITH GFR   No orders of the defined types were placed in this encounter.   Face-to-face time spent with patient was 30 minutes. Greater than 50% of time was spent in counseling and coordination of care.  Follow-Up Instructions: Return in about 1 year (around 04/19/2018) for Osteoarthritis,DDD.   Bo Merino, MD  Note - This record has been created using Editor, commissioning.  Chart creation errors have been sought, but may not always  have been located. Such creation errors do not reflect on  the standard of medical care.

## 2017-04-07 DIAGNOSIS — N950 Postmenopausal bleeding: Secondary | ICD-10-CM | POA: Diagnosis not present

## 2017-04-07 DIAGNOSIS — E039 Hypothyroidism, unspecified: Secondary | ICD-10-CM | POA: Diagnosis not present

## 2017-04-07 DIAGNOSIS — Z719 Counseling, unspecified: Secondary | ICD-10-CM | POA: Diagnosis not present

## 2017-04-07 DIAGNOSIS — L309 Dermatitis, unspecified: Secondary | ICD-10-CM | POA: Diagnosis not present

## 2017-04-07 DIAGNOSIS — R32 Unspecified urinary incontinence: Secondary | ICD-10-CM | POA: Diagnosis not present

## 2017-04-07 DIAGNOSIS — M545 Low back pain: Secondary | ICD-10-CM | POA: Diagnosis not present

## 2017-04-07 DIAGNOSIS — F3175 Bipolar disorder, in partial remission, most recent episode depressed: Secondary | ICD-10-CM | POA: Diagnosis not present

## 2017-04-07 DIAGNOSIS — N3281 Overactive bladder: Secondary | ICD-10-CM | POA: Diagnosis not present

## 2017-04-07 DIAGNOSIS — R202 Paresthesia of skin: Secondary | ICD-10-CM | POA: Diagnosis not present

## 2017-04-07 DIAGNOSIS — Z23 Encounter for immunization: Secondary | ICD-10-CM | POA: Diagnosis not present

## 2017-04-07 DIAGNOSIS — R609 Edema, unspecified: Secondary | ICD-10-CM | POA: Diagnosis not present

## 2017-04-07 DIAGNOSIS — R7309 Other abnormal glucose: Secondary | ICD-10-CM | POA: Diagnosis not present

## 2017-04-07 DIAGNOSIS — E7800 Pure hypercholesterolemia, unspecified: Secondary | ICD-10-CM | POA: Diagnosis not present

## 2017-04-19 ENCOUNTER — Encounter: Payer: Self-pay | Admitting: Rheumatology

## 2017-04-19 ENCOUNTER — Other Ambulatory Visit: Payer: Self-pay

## 2017-04-19 ENCOUNTER — Ambulatory Visit (INDEPENDENT_AMBULATORY_CARE_PROVIDER_SITE_OTHER): Payer: Medicare Other | Admitting: Rheumatology

## 2017-04-19 VITALS — BP 129/64 | HR 102 | Resp 18 | Ht 62.0 in | Wt 312.0 lb

## 2017-04-19 DIAGNOSIS — Z8659 Personal history of other mental and behavioral disorders: Secondary | ICD-10-CM | POA: Insufficient documentation

## 2017-04-19 DIAGNOSIS — Z8679 Personal history of other diseases of the circulatory system: Secondary | ICD-10-CM | POA: Diagnosis not present

## 2017-04-19 DIAGNOSIS — M19072 Primary osteoarthritis, left ankle and foot: Secondary | ICD-10-CM

## 2017-04-19 DIAGNOSIS — M5136 Other intervertebral disc degeneration, lumbar region: Secondary | ICD-10-CM

## 2017-04-19 DIAGNOSIS — Z8719 Personal history of other diseases of the digestive system: Secondary | ICD-10-CM

## 2017-04-19 DIAGNOSIS — Z87898 Personal history of other specified conditions: Secondary | ICD-10-CM | POA: Diagnosis not present

## 2017-04-19 DIAGNOSIS — M19041 Primary osteoarthritis, right hand: Secondary | ICD-10-CM | POA: Insufficient documentation

## 2017-04-19 DIAGNOSIS — M19071 Primary osteoarthritis, right ankle and foot: Secondary | ICD-10-CM | POA: Diagnosis not present

## 2017-04-19 DIAGNOSIS — Z8639 Personal history of other endocrine, nutritional and metabolic disease: Secondary | ICD-10-CM | POA: Diagnosis not present

## 2017-04-19 DIAGNOSIS — Z5181 Encounter for therapeutic drug level monitoring: Secondary | ICD-10-CM

## 2017-04-19 DIAGNOSIS — M170 Bilateral primary osteoarthritis of knee: Secondary | ICD-10-CM | POA: Insufficient documentation

## 2017-04-19 DIAGNOSIS — R600 Localized edema: Secondary | ICD-10-CM

## 2017-04-19 DIAGNOSIS — M19042 Primary osteoarthritis, left hand: Secondary | ICD-10-CM | POA: Diagnosis not present

## 2017-04-19 DIAGNOSIS — M51369 Other intervertebral disc degeneration, lumbar region without mention of lumbar back pain or lower extremity pain: Secondary | ICD-10-CM | POA: Insufficient documentation

## 2017-04-19 LAB — CBC WITH DIFFERENTIAL/PLATELET
Basophils Absolute: 70 cells/uL (ref 0–200)
Basophils Relative: 0.6 %
Eosinophils Absolute: 386 cells/uL (ref 15–500)
Eosinophils Relative: 3.3 %
HCT: 34.6 % — ABNORMAL LOW (ref 35.0–45.0)
Hemoglobin: 11.3 g/dL — ABNORMAL LOW (ref 11.7–15.5)
Lymphs Abs: 1837 cells/uL (ref 850–3900)
MCH: 30.1 pg (ref 27.0–33.0)
MCHC: 32.7 g/dL (ref 32.0–36.0)
MCV: 92.3 fL (ref 80.0–100.0)
MPV: 9.6 fL (ref 7.5–12.5)
Monocytes Relative: 9.2 %
Neutro Abs: 8330 cells/uL — ABNORMAL HIGH (ref 1500–7800)
Neutrophils Relative %: 71.2 %
Platelets: 419 10*3/uL — ABNORMAL HIGH (ref 140–400)
RBC: 3.75 10*6/uL — ABNORMAL LOW (ref 3.80–5.10)
RDW: 12.9 % (ref 11.0–15.0)
Total Lymphocyte: 15.7 %
WBC mixed population: 1076 cells/uL — ABNORMAL HIGH (ref 200–950)
WBC: 11.7 10*3/uL — ABNORMAL HIGH (ref 3.8–10.8)

## 2017-04-19 LAB — COMPLETE METABOLIC PANEL WITH GFR
AG Ratio: 1.6 (calc) (ref 1.0–2.5)
ALT: 18 U/L (ref 6–29)
AST: 21 U/L (ref 10–35)
Albumin: 4.1 g/dL (ref 3.6–5.1)
Alkaline phosphatase (APISO): 108 U/L (ref 33–130)
BUN: 20 mg/dL (ref 7–25)
CO2: 28 mmol/L (ref 20–32)
Calcium: 10.6 mg/dL — ABNORMAL HIGH (ref 8.6–10.4)
Chloride: 106 mmol/L (ref 98–110)
Creat: 0.82 mg/dL (ref 0.50–0.99)
GFR, Est African American: 86 mL/min/{1.73_m2} (ref >=60)
GFR, Est Non African American: 74 mL/min/{1.73_m2} (ref >=60)
Globulin: 2.5 g/dL (calc) (ref 1.9–3.7)
Glucose, Bld: 127 mg/dL — ABNORMAL HIGH (ref 65–99)
Potassium: 5.1 mmol/L (ref 3.5–5.3)
Sodium: 139 mmol/L (ref 135–146)
Total Bilirubin: 0.3 mg/dL (ref 0.2–1.2)
Total Protein: 6.6 g/dL (ref 6.1–8.1)

## 2017-04-19 NOTE — Patient Instructions (Addendum)
Labs are due in June and December (CBC, CMP with GFR)

## 2017-04-20 NOTE — Progress Notes (Signed)
Labs are stable.

## 2017-04-22 DIAGNOSIS — N3281 Overactive bladder: Secondary | ICD-10-CM | POA: Diagnosis not present

## 2017-04-22 DIAGNOSIS — Z6841 Body Mass Index (BMI) 40.0 and over, adult: Secondary | ICD-10-CM | POA: Diagnosis not present

## 2017-04-22 DIAGNOSIS — N950 Postmenopausal bleeding: Secondary | ICD-10-CM | POA: Diagnosis not present

## 2017-04-22 DIAGNOSIS — R399 Unspecified symptoms and signs involving the genitourinary system: Secondary | ICD-10-CM | POA: Diagnosis not present

## 2017-04-22 DIAGNOSIS — Z09 Encounter for follow-up examination after completed treatment for conditions other than malignant neoplasm: Secondary | ICD-10-CM | POA: Diagnosis not present

## 2017-04-22 DIAGNOSIS — N390 Urinary tract infection, site not specified: Secondary | ICD-10-CM | POA: Diagnosis not present

## 2017-05-06 DIAGNOSIS — J069 Acute upper respiratory infection, unspecified: Secondary | ICD-10-CM | POA: Diagnosis not present

## 2017-05-06 DIAGNOSIS — L858 Other specified epidermal thickening: Secondary | ICD-10-CM | POA: Diagnosis not present

## 2017-05-06 DIAGNOSIS — M792 Neuralgia and neuritis, unspecified: Secondary | ICD-10-CM | POA: Diagnosis not present

## 2017-05-16 DIAGNOSIS — F319 Bipolar disorder, unspecified: Secondary | ICD-10-CM | POA: Diagnosis not present

## 2017-05-17 DIAGNOSIS — M47816 Spondylosis without myelopathy or radiculopathy, lumbar region: Secondary | ICD-10-CM | POA: Diagnosis not present

## 2017-05-20 DIAGNOSIS — M792 Neuralgia and neuritis, unspecified: Secondary | ICD-10-CM | POA: Diagnosis not present

## 2017-05-20 DIAGNOSIS — N760 Acute vaginitis: Secondary | ICD-10-CM | POA: Diagnosis not present

## 2017-05-24 DIAGNOSIS — B078 Other viral warts: Secondary | ICD-10-CM | POA: Diagnosis not present

## 2017-05-24 DIAGNOSIS — L570 Actinic keratosis: Secondary | ICD-10-CM | POA: Diagnosis not present

## 2017-05-24 DIAGNOSIS — I872 Venous insufficiency (chronic) (peripheral): Secondary | ICD-10-CM | POA: Diagnosis not present

## 2017-05-24 DIAGNOSIS — L821 Other seborrheic keratosis: Secondary | ICD-10-CM | POA: Diagnosis not present

## 2017-05-24 DIAGNOSIS — X32XXXA Exposure to sunlight, initial encounter: Secondary | ICD-10-CM | POA: Diagnosis not present

## 2017-05-26 ENCOUNTER — Other Ambulatory Visit: Payer: Self-pay | Admitting: *Deleted

## 2017-05-26 ENCOUNTER — Encounter: Payer: Self-pay | Admitting: Neurology

## 2017-05-26 DIAGNOSIS — M792 Neuralgia and neuritis, unspecified: Secondary | ICD-10-CM

## 2017-06-09 ENCOUNTER — Encounter: Payer: Self-pay | Admitting: Neurology

## 2017-06-16 ENCOUNTER — Ambulatory Visit (INDEPENDENT_AMBULATORY_CARE_PROVIDER_SITE_OTHER): Payer: Medicare Other | Admitting: Neurology

## 2017-06-16 DIAGNOSIS — M792 Neuralgia and neuritis, unspecified: Secondary | ICD-10-CM | POA: Diagnosis not present

## 2017-06-16 DIAGNOSIS — G5602 Carpal tunnel syndrome, left upper limb: Secondary | ICD-10-CM

## 2017-06-16 NOTE — Procedures (Signed)
[facility]  [place], [address]  [place],  [address] Tel: [phone] Fax:  [phone] Test Date:  06/16/2017  Patient: Jessica [name] DOB: 1950-11-24 Physician: Narda Amber, DO  Sex: Female Height: 5\' 2"  Ref Phys: L. Donnie Coffin, MD  ID#: [id number] Temp: 32.2C Technician:    Patient Complaints: This is a 67 year old female referred for evaluation of left hand tingling.  NCV & EMG Findings: Extensive electrodiagnostic testing of the left upper extremity shows:  1. Left median sensory response is absent. Left radial and ulnar sensory responses are within normal limits. 2. Left median motor response shows severely prolonged distal onset latency (9.3 ms) and reduced amplitude (2.2 mV).  Left ulnar motor response is within normal limits.  3. Chronic motor axon loss changes isolated to the left abductor pollicis brevis muscle, without accompanied active denervation.   Impression: Left median neuropathy at or distal to the wrist, consistent with the clinical diagnosis of carpal tunnel syndrome. Overall, these findings are very severe in degree electrically.   ___________________________ Narda Amber, DO    Nerve Conduction Studies Anti Sensory Summary Table   Site NR Peak (ms) Norm Peak (ms) P-T Amp (V) Norm P-T Amp  Left Median Anti Sensory (2nd Digit)  32.2C  Wrist NR  <3.8  >10  Left Radial Anti Sensory (Base 1st Digit)  32.2C  Wrist    2.3 <2.8 13.6 >10  Left Ulnar Anti Sensory (5th Digit)  32.2C  Wrist    3.2 <3.2 18.0 >5   Motor Summary Table   Site NR Onset (ms) Norm Onset (ms) O-P Amp (mV) Norm O-P Amp Site1 Site2 Delta-0 (ms) Dist (cm) Vel (m/s) Norm Vel (m/s)  Left Median Motor (Abd Poll Brev)  32.2C  Wrist    9.3 <4.0 2.2 >5 Elbow Wrist 5.8 29.0 50 >50  Elbow    15.1  2.0         Left Ulnar Motor (Abd Dig Minimi)  32.2C  Wrist    2.9 <3.1 9.5 >7 B Elbow Wrist 4.0 22.0 55 >50  B Elbow    6.9  9.4  A Elbow B Elbow 1.5 10.0 67  >50  A Elbow    8.4  8.9          EMG   Side Muscle Ins Act Fibs Psw Fasc Number Recrt Dur Dur. Amp Amp. Poly Poly. Comment  Left 1stDorInt Nml Nml Nml Nml Nml Nml Nml Nml Nml Nml Nml Nml N/A  Left Abd Poll Brev Nml Nml Nml Nml 2- Rapid Many 1+ Many 1+ Some 1+ N/A  Left PronatorTeres Nml Nml Nml Nml Nml Nml Nml Nml Nml Nml Nml Nml N/A  Left Biceps Nml Nml Nml Nml Nml Nml Nml Nml Nml Nml Nml Nml N/A  Left Triceps Nml Nml Nml Nml Nml Nml Nml Nml Nml Nml Nml Nml N/A  Left Deltoid Nml Nml Nml Nml Nml Nml Nml Nml Nml Nml Nml Nml N/A      Waveforms:

## 2017-06-21 ENCOUNTER — Other Ambulatory Visit: Payer: Self-pay | Admitting: Rheumatology

## 2017-06-21 NOTE — Telephone Encounter (Signed)
Last Visit: 04/19/17 Next Visit: 04/11/18  Okay to refill per Dr. Deveshwar  

## 2017-06-28 ENCOUNTER — Other Ambulatory Visit: Payer: Self-pay | Admitting: Family Medicine

## 2017-06-28 DIAGNOSIS — Z139 Encounter for screening, unspecified: Secondary | ICD-10-CM

## 2017-06-28 DIAGNOSIS — E039 Hypothyroidism, unspecified: Secondary | ICD-10-CM | POA: Diagnosis not present

## 2017-06-29 DIAGNOSIS — M180 Bilateral primary osteoarthritis of first carpometacarpal joints: Secondary | ICD-10-CM | POA: Diagnosis not present

## 2017-06-29 DIAGNOSIS — G5602 Carpal tunnel syndrome, left upper limb: Secondary | ICD-10-CM | POA: Diagnosis not present

## 2017-06-30 ENCOUNTER — Encounter: Payer: Medicare Other | Admitting: Neurology

## 2017-06-30 DIAGNOSIS — N3941 Urge incontinence: Secondary | ICD-10-CM | POA: Diagnosis not present

## 2017-06-30 DIAGNOSIS — N3281 Overactive bladder: Secondary | ICD-10-CM | POA: Diagnosis not present

## 2017-06-30 DIAGNOSIS — N950 Postmenopausal bleeding: Secondary | ICD-10-CM | POA: Diagnosis not present

## 2017-06-30 DIAGNOSIS — R8279 Other abnormal findings on microbiological examination of urine: Secondary | ICD-10-CM | POA: Diagnosis not present

## 2017-07-13 DIAGNOSIS — X32XXXD Exposure to sunlight, subsequent encounter: Secondary | ICD-10-CM | POA: Diagnosis not present

## 2017-07-13 DIAGNOSIS — I872 Venous insufficiency (chronic) (peripheral): Secondary | ICD-10-CM | POA: Diagnosis not present

## 2017-07-13 DIAGNOSIS — I890 Lymphedema, not elsewhere classified: Secondary | ICD-10-CM | POA: Diagnosis not present

## 2017-07-13 DIAGNOSIS — L570 Actinic keratosis: Secondary | ICD-10-CM | POA: Diagnosis not present

## 2017-07-13 DIAGNOSIS — L820 Inflamed seborrheic keratosis: Secondary | ICD-10-CM | POA: Diagnosis not present

## 2017-07-19 ENCOUNTER — Ambulatory Visit: Payer: Medicare Other | Attending: Family Medicine | Admitting: Physical Therapy

## 2017-07-19 DIAGNOSIS — M6281 Muscle weakness (generalized): Secondary | ICD-10-CM | POA: Diagnosis not present

## 2017-07-19 DIAGNOSIS — M62830 Muscle spasm of back: Secondary | ICD-10-CM

## 2017-07-19 DIAGNOSIS — M545 Low back pain: Secondary | ICD-10-CM | POA: Insufficient documentation

## 2017-07-19 DIAGNOSIS — G8929 Other chronic pain: Secondary | ICD-10-CM | POA: Diagnosis not present

## 2017-07-19 DIAGNOSIS — R262 Difficulty in walking, not elsewhere classified: Secondary | ICD-10-CM | POA: Diagnosis not present

## 2017-07-19 DIAGNOSIS — M5416 Radiculopathy, lumbar region: Secondary | ICD-10-CM | POA: Diagnosis not present

## 2017-07-19 NOTE — Patient Instructions (Signed)
Posture Tips DO: - stand tall and erect - keep chin tucked in - keep head and shoulders in alignment - check posture regularly in mirror or large window - pull head back against headrest in car seat;  Change your position often.  Sit with lumbar support. DON'T: - slouch or slump while watching TV or reading - sit, stand or lie in one position  for too long;  Sitting is especially hard on the spine so if you sit at a desk/use the computer, then stand up often!   Copyright  VHI. All rights reserved.  Posture - Standing   Good posture is important. Avoid slouching and forward head thrust. Maintain curve in low back and align ears over shoul- ders, hips over ankles.  Pull your belly button in toward your back bone. Stand with even weight in feet and ribs/chest lifted up with chin down.  Working on your posture alignment will take pressure off your back.  Copyright  VHI. All rights reserved.  Posture - Sitting   Sit upright, head facing forward. Try using a roll to support lower back. Keep shoulders relaxed, and avoid rounded back. Keep hips level with knees. Avoid crossing legs for long periods. Use a stool under your feet so your back will not be pulled on.   Use a towel for a lumbar support rolled up.  Sit up right and keep using the pillow behind your back on the couch.    Voncille Lo, PT Certified Exercise Expert for the Aging Adult  07/19/17 3:10 PM Phone: [phone] Fax: [phone]   Copyright  VHI. All rights reserved.

## 2017-07-19 NOTE — Therapy (Addendum)
[place], [place], [address] Phone: [phone]   Fax:  [phone]  Physical Therapy Evaluation  Patient Details  Name: Jessica [name] MRN: [id number] Date of Birth: [birth year] Referring Provider: Donavan Burnet MD   Encounter Date: 07/19/2017  PT End of Session - 07/19/17 1510    Visit Number  1    Number of Visits  16    Date for PT Re-Evaluation  09/27/17    Authorization Type  Medicare    PT Start Time  1416    PT Stop Time  1500    PT Time Calculation (min)  44 min    Activity Tolerance  Treatment limited secondary to agitation;Patient limited by pain;Other (comment) Pt was irritated by commotion in lobby and inability to get private room    Behavior During Therapy  Agitated pt could not be pleased /demanding and trying to direct the evaluatin rather than answering sequential questions., clearly irritable about past treatment in same facility       Past Medical History:  Diagnosis Date  . Arthritis    lower back, hands, knees  . Bipolar disorder ([place])   . Depression   . Dyspnea    with exertion  . GERD (gastroesophageal reflux disease)    patient unsure about this dx - no meds  . Headache   . History of IBS    watches diet  . Hyperlipidemia   . Hypertension   . Hypothyroidism   . Obese   . Sleep apnea 2017   uses CPAP   . SVD (spontaneous vaginal delivery)    x 2  . Thyroid disease     Past Surgical History:  Procedure Laterality Date  . carpel tunnel surgery Right   . CATARACT EXTRACTION Bilateral 2007   w/ lens implants  . COLONOSCOPY    . DILATION AND CURETTAGE OF UTERUS    . FOOT SURGERY Right    hammer toe  . LEG SURGERY Right   . TONSILLECTOMY    . WISDOM TOOTH EXTRACTION      There were no vitals filed for this visit.   Subjective Assessment - 07/19/17 1427    Subjective   since October, I have been using a rollator and it relieves the pain when I am  walking.   I have injured my left wrist with carpal tunnel. I am trying to schedule carpal tunnel surgery in May for left wrist.  I am doing Weight Watchers and have lost 15 lbs so far.  I am able to walk unadied without undue pain.Marland Kitchen why cant i see the same PT's I saw last time. I want better continuity of care.  I need a PT to under stand that I may not be able to do all the exercises .  I have trouble with lying on my back due to incontinence and sleep apnea    Pertinent History  Right carpal tunnel surgery > 20 years, sleep apnea, back pain history, morbid obesity    Limitations  Sitting    How long can you sit comfortably?  30 minutes but I need to shift    How long can you stand comfortably?  < 5 minutes    How long can you walk comfortably?  < 5 minutes    Diagnostic tests  MRI    Patient Stated Goals  I would like to relieve back pain and make sure I am using the rollator properly.  Currently in Pain?  Yes    Pain Score  8  1 at present.  8/10 walking without assistive device    Pain Location  Back    Pain Orientation  Right;Left    Pain Descriptors / Indicators  Aching;Numbness;Tingling three smaller toes tingling bil     Pain Type  Chronic pain    Pain Onset  More than a month ago    Pain Frequency  Intermittent    Aggravating Factors   standing longer tham 5 minutes or walking         [facility] PT Assessment - 07/19/17 1432      Assessment   Medical Diagnosis  low back pain bil     Referring Provider  Donavan Burnet MD    Onset Date/Surgical Date  -- back pain for years , came for PT in fall for back pain    Hand Dominance  Right    Prior Therapy  yes in the fall of 2019      Precautions   Precautions  None      Restrictions   Weight Bearing Restrictions  No      Balance Screen   Has the patient fallen in the past 6 months  No    Has the patient had a decrease in activity level because of a fear of falling?   No    Is the patient reluctant to leave their home  because of a fear of falling?   No      Home Film/video editor residence    Living Arrangements  Spouse/significant other    Type of [place] to enter    Entrance Stairs-Number of Steps  7    Entrance Stairs-Rails  Can reach both    [place]  Two level      Prior Function   Level of Independence  Independent with household mobility with device    Vocation Requirements  uses rollator for walks and to be independent with Mobility      Cognition   Overall Cognitive Status  Within Functional Limits for tasks assessed    Attention  Focused    Focused Attention  Appears intact    Memory  --      Observation/Other Assessments   Observations   Obesity and sacral sits with observation    Focus on Therapeutic Outcomes (FOTO)   FOTO intake 36%, limitation 64%  predicted 52%      Sensation   Light Touch  Appears Intact    Additional Comments  reports that she has tingling down to bil 3 lateral toes      Posture/Postural Control   Posture/Postural Control  Postural limitations    Postural Limitations  Rounded Shoulders;Forward head;Flexed trunk    Posture Comments  Pt morbid obesity and increased abdominal girth right pelvic level elevated over left      ROM / Strength   AROM / PROM / Strength  AROM;Strength      AROM   Overall AROM   Deficits    Right Hip Flexion  90    Left Hip Flexion  80    Lumbar Flexion  40 pain    Lumbar Extension  5 limited with pain    Lumbar - Right Side Bend  15 limited with pain    Lumbar - Left Side Bend  9 limitied with pain    Lumbar - Right  Rotation  75 % limitation    Lumbar - Left Rotation  75 % limitation      Strength   Overall Strength  Deficits    Overall Strength Comments  Fair abdominal contractins     Right Hip Flexion  3-/5    Right Hip Extension  2+/5    Right Hip ABduction  2+/5    Left Hip Flexion  2+/5    Left Hip Extension  3-/5    Left Hip ABduction  3-/5    Right Knee  Flexion  4-/5    Right Knee Extension  4-/5    Left Knee Flexion  4-/5    Left Knee Extension  4-/5      Flexibility   Hamstrings  limited on left to 40, and right to 50,      Palpation   Palpation comment  tender ness over bil Quadratus lumborum right > left TTP, some noted tenderness over bil lumbar paraspinals      Transfers   Comments  Pt tried to lie supine but had difficulty maneuvering. Also had problems with incontinence with supine to sit transfer, required min assist      Ambulation/Gait   Ambulation Distance (Feet)  100 Feet from lobby to room    Assistive device  Rollator    Gait Pattern  Decreased stride length;Decreased hip/knee flexion - right;Decreased hip/knee flexion - left    Ambulation Surface  Level    Gait velocity  1.27 ft/ sec    Gait Comments  hip flexion limited by increased abdominal girth             Objective measurements completed on examination: See above findings.      [facility] Adult PT Treatment/Exercise - 07/19/17 1432      Self-Care   Self-Care  Posture    Posture  discussed proper posture for sitting and standing and lumbar rolls    Other Self-Care Comments   checked rollator height and proper use by pt to protect left wrist             PT Education - 07/19/17 1230    Education provided  Yes    Education Details  POC Explanation of findings.  importance of strengthening and consisitency with exercises.  posture sitting and standing    Person(s) Educated  Patient    Methods  Explanation;Demonstration;Handout;Verbal cues;Tactile cues    Comprehension  Verbalized understanding;Returned demonstration       PT Short Term Goals - 07/19/17 1855      PT SHORT TERM GOAL #1   Title  Patient will demonstrate proper  technique for transfers to decrease stress on back in order to transition from sitting to lying down and sitting to standing without overuse of UE's     Baseline  pt with carpal tunnel on left hand and is trying to  schedule surgery.     Time  5    Period  Weeks    Status  New    Target Date  08/23/17      PT SHORT TERM GOAL #2   Title  Patient will be independent with initial HEP and strengthening program     Baseline  reports she is not consistent with exercise program in the past    Time  4    Period  Weeks    Status  New    Target Date  08/23/17      PT SHORT TERM GOAL #3   Title  Pt will state 3 pain management strategies or proper posture /body mechanics for household chores    Time  4    Period  Weeks    Status  New    Target Date  08/23/17      PT SHORT TERM GOAL #4   Title  Pt will be able to stand and walk for 400 feet with =/< 4/10 pain    Time  4    Period  Weeks    Status  New    Target Date  08/23/17      PT SHORT TERM GOAL #5   Target Date  --        PT Long Term Goals - 07/19/17 1501      PT LONG TERM GOAL #1   Title  Patient will ambulate 3000' without increased pain in order to go shopping and to walk for recreation     Baseline  Pt currently not in routine walking program    Time  10    Period  Weeks    Status  New    Target Date  09/27/17      PT LONG TERM GOAL #2   Title  Patient will stand for 20  min without increased pain in order to perform ADL's     Time  10    Period  Weeks    Status  New    Target Date  09/27/17      PT LONG TERM GOAL #3   Title  Patient wil be independent with a strengthening program to improve overall health     Time  10    Period  Weeks    Status  Deferred    Target Date  09/27/17      PT LONG TERM GOAL #4   Title  Pt will walk with at least 2.0 ft/ sec to be limited community ambulator    Baseline  Pt now at household ambulator level < 1.31 ft/sec    Time  10    Period  Weeks    Status  New    Target Date  09/27/17      PT LONG TERM GOAL #5   Title  "FOTO will improve from 64 % limitation   to   52% limitation  indicating improved functional mobility     Time  10    Period  Weeks    Status  New    Target Date   09/27/17      PT LONG TERM GOAL #6   Title  "Pt will improve lower extremity  strength throughout  to >/=  4- /5 with </= 4/10 pain to promote safety with walking/standing activities    Baseline  Pt grossly 2+/5 to 4-/5 for lower extremity     Time  10    Period  Weeks    Status  New    Target Date  09/27/17             Plan - 07/19/17 1510    Clinical Impression Statement  Pt entered clinic agitated after being in chaotic lobby with children and was initially upset at not having a private room.  PT made every accomodation and also tried to find another clinic that might be less chaotic for her, Pt decided to proceed with evaluation at [facility] clinic.  Pt was fixated on PT helping her utilize rollator properly due to her left wrist pain due to carpal tunnel. Pt presents  with long history of back pain and stenocis and facet arthropathy with symptoms of pain and tingling down to bil lateral 3 toes. Marland Kitchen Her back pain increases as she ambulates. she indicates her pain is across her low back at Quaadratus  lumborum and with lumbar paraspinal. bil. She has significant weaknenss and decreased lumbar motion.  and morbid obesity. which limits her hip flexion Pt has decerased strength in hips and core.  She has difficulty with transtitional movements and difficulty remaining in supine position as she states diffciculty with incontinence issues.  Pt might also benefit from pelvic PT not at this site at some point.  Pt stated she has some difficulty completing exercisese given. It was explained to Mrs Nyoka Cowden walt that  for her goals to be achieved would take much participation from her and we are happy to assist her but she would need to put the work in at her home between therapy session.  Pt would benefit form 2 x a week for 10 weeks in order to maximize function and goals.  Pt was seen for moderate complexity eval due to Med history and muliple co morbidites as well as multiple functional deficits with  basic transitional movements    History and Personal Factors relevant to plan of care:  history of back pain, incontinence. right carpal tunned with surgery, left current carpal tunnel with possible scheduling if surgery.      Clinical Presentation  Evolving    Clinical Decision Making  Moderate    Rehab Potential  Fair    PT Frequency  2x / week    PT Duration  12 weeks    PT Treatment/Interventions  ADLs/Self Care Home Management;Cryotherapy;Electrical Stimulation;Iontophoresis 4mg /ml Dexamethasone;Moist Heat;Dry needling;Taping;Passive range of motion;Patient/family education;Manual techniques;Neuromuscular re-education;Therapeutic exercise;Therapeutic activities;Functional mobility training;Stair training;Gait training;Ultrasound;Traction;DME Instruction    PT Next Visit Plan  give basic stretches hip flex, hamstring stretch in sitting.  if pt cannot lie down,  Try to perform IR/ER stretch in sittting. heel to knee as able to provide some stretch.  Pt will be travelling for a month, Basic stetches and maybe Sit to stand activity for strength    PT Home Exercise Plan  posture sitting and standing. how to use rollator    Consulted and Agree with Plan of Care  Patient       Patient will benefit from skilled therapeutic intervention in order to improve the following deficits and impairments:  Difficulty walking, Decreased range of motion, Decreased activity tolerance, Decreased mobility, Decreased knowledge of use of DME, Pain, Obesity, Impaired UE functional use, Improper body mechanics, Postural dysfunction, Impaired sensation, Impaired flexibility, Increased muscle spasms, Increased fascial restricitons, Hypomobility, Decreased strength  Visit Diagnosis: Chronic bilateral low back pain without sciatica - Plan: PT plan of care cert/re-cert  Muscle spasm of back - Plan: PT plan of care cert/re-cert  Difficulty in walking, not elsewhere classified - Plan: PT plan of care cert/re-cert  Muscle  weakness (generalized) - Plan: PT plan of care cert/re-cert     Problem List Patient Active Problem List   Diagnosis Date Noted  . Primary osteoarthritis of both hands 04/19/2017  . Primary osteoarthritis of both knees 04/19/2017  . Primary osteoarthritis of both feet 04/19/2017  . DDD (degenerative disc disease), lumbar 04/19/2017  . History of bipolar disorder 04/19/2017  . Hypothyroidism 06/04/2014  . Chest pain 01/23/2013  . ARF (acute renal failure) ([place]) 01/23/2013  . HTN (hypertension) 01/23/2013  . Leukocytosis 01/23/2013  . HLD (hyperlipidemia) 01/23/2013  Voncille Lo, PT Certified Exercise Expert for the Aging Adult  07/19/17 7:35 PM Phone: [phone] Fax: [phone]  [facility] [address] [place], [place], [address] Phone: [phone]   Fax:  [phone]  Name: Jessica [name] MRN: [id number] Date of Birth: 04-15-1951

## 2017-07-20 DIAGNOSIS — M47816 Spondylosis without myelopathy or radiculopathy, lumbar region: Secondary | ICD-10-CM | POA: Diagnosis not present

## 2017-07-20 DIAGNOSIS — M47817 Spondylosis without myelopathy or radiculopathy, lumbosacral region: Secondary | ICD-10-CM | POA: Diagnosis not present

## 2017-07-22 ENCOUNTER — Encounter: Payer: Self-pay | Admitting: Physical Therapy

## 2017-07-22 ENCOUNTER — Ambulatory Visit: Payer: Medicare Other | Admitting: Physical Therapy

## 2017-07-22 DIAGNOSIS — M545 Low back pain: Principal | ICD-10-CM

## 2017-07-22 DIAGNOSIS — M6281 Muscle weakness (generalized): Secondary | ICD-10-CM

## 2017-07-22 DIAGNOSIS — R262 Difficulty in walking, not elsewhere classified: Secondary | ICD-10-CM | POA: Diagnosis not present

## 2017-07-22 DIAGNOSIS — M62830 Muscle spasm of back: Secondary | ICD-10-CM

## 2017-07-22 DIAGNOSIS — M5416 Radiculopathy, lumbar region: Secondary | ICD-10-CM

## 2017-07-22 DIAGNOSIS — G8929 Other chronic pain: Secondary | ICD-10-CM

## 2017-07-22 NOTE — Therapy (Addendum)
[place], [place], [address] Phone: [phone]   Fax:  [phone]  Physical Therapy Treatment  Patient Details  Name: Jessica [name] MRN: [id number] Date of Birth: 1951/01/24 Referring Provider: Donavan Burnet MD   Encounter Date: 07/22/2017  PT End of Session - 07/22/17 1230    Visit Number  2    Number of Visits  16    Date for PT Re-Evaluation  09/27/17    Authorization Type  Medicare    PT Start Time  2993    PT Stop Time  1245    PT Time Calculation (min)  56 min       Past Medical History:  Diagnosis Date  . Arthritis    lower back, hands, knees  . Bipolar disorder ([place])   . Depression   . Dyspnea    with exertion  . GERD (gastroesophageal reflux disease)    patient unsure about this dx - no meds  . Headache   . History of IBS    watches diet  . Hyperlipidemia   . Hypertension   . Hypothyroidism   . Obese   . Sleep apnea 2017   uses CPAP   . SVD (spontaneous vaginal delivery)    x 2  . Thyroid disease     Past Surgical History:  Procedure Laterality Date  . carpel tunnel surgery Right   . CATARACT EXTRACTION Bilateral 2007   w/ lens implants  . COLONOSCOPY    . DILATION AND CURETTAGE OF UTERUS    . FOOT SURGERY Right    hammer toe  . LEG SURGERY Right   . TONSILLECTOMY    . WISDOM TOOTH EXTRACTION      There were no vitals filed for this visit.  Subjective Assessment - 07/22/17 1151    Subjective  My back isn't too bad. The rest of me is not too good.     Currently in Pain?  Yes    Pain Score  1     Pain Location  Back    Pain Orientation  Left;Right    Pain Descriptors / Indicators  Aching    Pain Type  Chronic pain                No data recorded       [facility] Adult PT Treatment/Exercise - 07/22/17 0001      Ambulation/Gait   Ambulation Distance (Feet)  100 Feet x 3     Assistive device  Rollator;None    Gait Pattern  Decreased stride  length;Decreased hip/knee flexion - right;Decreased hip/knee flexion - left    Ambulation Surface  Level    Gait Comments  decreased step length right , cues for increased steo length, sbdominal engagement, heel strike with gait without AD       Self-Care   Self-Care  ADL's;Posture;Other Self-Care Comments    ADL's  Use to trekking poles verses her two wooden walking canes     Posture  elevate feet 2 inches with small stool     Other Self-Care Comments   adjusted rollator - one side was 1 level higher than then other       Lumbar Exercises: Aerobic   Nustep  L3 LE x 5 minutes with 1 rest break at 3 minutes       Lumbar Exercises: Standing   Other Standing Lumbar Exercises  standing 3 way hip 2 sets of 5 each bialteral in parallel  bars       Lumbar Exercises: Seated   Sit to Stand  10 reps without UE from standard chair     Other Seated Lumbar Exercises  physioball press for abdominal recruitment -ball in chair in front of patient- 5 sec x 10 cues for breathing             PT Education - 07/22/17 1311    Education provided  Yes    Education Details  HEP    Person(s) Educated  Patient    Methods  Explanation;Handout    Comprehension  Verbalized understanding       PT Short Term Goals - 07/19/17 1855      PT SHORT TERM GOAL #1   Title  Patient will demonstrate proper  technique for transfers to decrease stress on back in order to transition from sitting to lying down and sitting to standing without overuse of UE's     Baseline  pt with carpal tunnel on left hand and is trying to schedule surgery.     Time  5    Period  Weeks    Status  New    Target Date  08/23/17      PT SHORT TERM GOAL #2   Title  Patient will be independent with initial HEP and strengthening program     Baseline  reports she is not consistent with exercise program in the past    Time  4    Period  Weeks    Status  New    Target Date  08/23/17      PT SHORT TERM GOAL #3   Title  Pt will state 3  pain management strategies or proper posture /body mechanics for household chores    Time  4    Period  Weeks    Status  New    Target Date  08/23/17      PT SHORT TERM GOAL #4   Title  Pt will be able to stand and walk for 400 feet with =/< 4/10 pain    Time  4    Period  Weeks    Status  New    Target Date  08/23/17      PT SHORT TERM GOAL #5   Target Date  --        PT Long Term Goals - 07/19/17 1501      PT LONG TERM GOAL #1   Title  Patient will ambulate 3000' without increased pain in order to go shopping and to walk for recreation     Baseline  Pt currently not in routine walking program    Time  10    Period  Weeks    Status  New    Target Date  09/27/17      PT LONG TERM GOAL #2   Title  Patient will stand for 20  min without increased pain in order to perform ADL's     Time  10    Period  Weeks    Status  New    Target Date  09/27/17      PT LONG TERM GOAL #3   Title  Patient wil be independent with a strengthening program to improve overall health     Time  10    Period  Weeks    Status  Deferred    Target Date  09/27/17      PT LONG TERM GOAL #4   Title  Pt  will walk with at least 2.0 ft/ sec to be limited community ambulator    Baseline  Pt now at household ambulator level < 1.31 ft/sec    Time  10    Period  Weeks    Status  New    Target Date  09/27/17      PT LONG TERM GOAL #5   Title  "FOTO will improve from 64 % limitation   to   52% limitation  indicating improved functional mobility     Time  10    Period  Weeks    Status  New    Target Date  09/27/17      PT LONG TERM GOAL #6   Title  "Pt will improve lower extremity  strength throughout  to >/=  4- /5 with </= 4/10 pain to promote safety with walking/standing activities    Baseline  Pt grossly 2+/5 to 4-/5 for lower extremity     Time  10    Period  Weeks    Status  New    Target Date  09/27/17            Plan - 07/22/17 1257    Clinical Impression Statement  Pt arrives  with questions about cushions for rollator handles. Given options to purchase online verses medical supply. Adjusted rollator as the left hand was one level above the right. She reports taking a walk in the woods yesterday for her birthday using bilateral wooden walking sticks. She reports she waked 100 yards with rollator and 40 yards x 2 with rollator with one rest break while sitting on fallen tree. She seems eager to work toward her goals of increased walking endurance. She was given an initial HEP and she had many questions regarding rollator, trekking poles, her gait and sitting posture. All questions answered.     PT Next Visit Plan  give basic stretches hip flex, hamstring stretch in sitting.  if pt cannot lie down,  Try to perform IR/ER stretch in sittting. heel to knee as able to provide some stretch.  Pt will be travelling for a month,add Basic stetches    PT Home Exercise Plan  posture sitting and standing. how to use rollator, sit-stand, standing way hip, seated clam red band     Consulted and Agree with Plan of Care  Patient       Patient will benefit from skilled therapeutic intervention in order to improve the following deficits and impairments:  Difficulty walking, Decreased range of motion, Decreased activity tolerance, Decreased mobility, Decreased knowledge of use of DME, Pain, Obesity, Impaired UE functional use, Improper body mechanics, Postural dysfunction, Impaired sensation, Impaired flexibility, Increased muscle spasms, Increased fascial restricitons, Hypomobility, Decreased strength  Visit Diagnosis: Chronic bilateral low back pain without sciatica  Muscle spasm of back  Difficulty in walking, not elsewhere classified  Muscle weakness (generalized)  Radiculopathy, lumbar region     Problem List Patient Active Problem List   Diagnosis Date Noted  . Primary osteoarthritis of both hands 04/19/2017  . Primary osteoarthritis of both knees 04/19/2017  . Primary  osteoarthritis of both feet 04/19/2017  . DDD (degenerative disc disease), lumbar 04/19/2017  . History of bipolar disorder 04/19/2017  . Hypothyroidism 06/04/2014  . Chest pain 01/23/2013  . ARF (acute renal failure) ([place]) 01/23/2013  . HTN (hypertension) 01/23/2013  . Leukocytosis 01/23/2013  . HLD (hyperlipidemia) 01/23/2013    Dorene Ar, PTA 07/22/2017, 1:12 PM  [facility]  [place], [place], [address] Phone: [phone]   Fax:  [phone]  Name: SEJLA MARZANO MRN: [id number] Date of Birth: 1950/07/04

## 2017-07-26 ENCOUNTER — Other Ambulatory Visit: Payer: Self-pay | Admitting: *Deleted

## 2017-07-26 DIAGNOSIS — F319 Bipolar disorder, unspecified: Secondary | ICD-10-CM | POA: Diagnosis not present

## 2017-07-26 MED ORDER — CELECOXIB 200 MG PO CAPS: 200.0000 mg | ORAL_CAPSULE | Freq: Every day | ORAL | 0 refills | Status: DC

## 2017-07-26 NOTE — Telephone Encounter (Signed)
Refill request received via fax  Last Visit: 04/19/17 Next Visit: 04/11/18 Labs: 04/19/17 Stable   Okay to refill per Dr. Estanislado Pandy

## 2017-07-27 DIAGNOSIS — F3175 Bipolar disorder, in partial remission, most recent episode depressed: Secondary | ICD-10-CM | POA: Diagnosis not present

## 2017-07-28 ENCOUNTER — Ambulatory Visit: Payer: Medicare Other

## 2017-08-22 ENCOUNTER — Encounter: Payer: Medicare Other | Admitting: Physical Therapy

## 2017-08-22 DIAGNOSIS — M47816 Spondylosis without myelopathy or radiculopathy, lumbar region: Secondary | ICD-10-CM | POA: Diagnosis not present

## 2017-08-22 DIAGNOSIS — M545 Low back pain: Secondary | ICD-10-CM | POA: Diagnosis not present

## 2017-08-23 ENCOUNTER — Ambulatory Visit: Payer: Medicare Other | Attending: Family Medicine | Admitting: Physical Therapy

## 2017-08-23 ENCOUNTER — Encounter: Payer: Self-pay | Admitting: Physical Therapy

## 2017-08-23 DIAGNOSIS — L298 Other pruritus: Secondary | ICD-10-CM | POA: Diagnosis not present

## 2017-08-23 DIAGNOSIS — M545 Low back pain, unspecified: Secondary | ICD-10-CM

## 2017-08-23 DIAGNOSIS — R0981 Nasal congestion: Secondary | ICD-10-CM | POA: Diagnosis not present

## 2017-08-23 DIAGNOSIS — M62830 Muscle spasm of back: Secondary | ICD-10-CM | POA: Insufficient documentation

## 2017-08-23 DIAGNOSIS — M6281 Muscle weakness (generalized): Secondary | ICD-10-CM | POA: Insufficient documentation

## 2017-08-23 DIAGNOSIS — G8929 Other chronic pain: Secondary | ICD-10-CM | POA: Diagnosis not present

## 2017-08-23 DIAGNOSIS — M5416 Radiculopathy, lumbar region: Secondary | ICD-10-CM | POA: Insufficient documentation

## 2017-08-23 DIAGNOSIS — R262 Difficulty in walking, not elsewhere classified: Secondary | ICD-10-CM

## 2017-08-23 DIAGNOSIS — G5600 Carpal tunnel syndrome, unspecified upper limb: Secondary | ICD-10-CM | POA: Diagnosis not present

## 2017-08-23 NOTE — Therapy (Signed)
[place], [place], [address] Phone: [phone]   Fax:  [phone]  Physical Therapy Treatment  Patient Details  Name: Jessica [name] MRN: [id number] Date of Birth: 1950/12/13 Referring Provider: Donavan Burnet MD   Encounter Date: 08/23/2017  PT End of Session - 08/23/17 1343    Visit Number  3    Number of Visits  16    Date for PT Re-Evaluation  09/27/17    Authorization Type  Medicare    PT Start Time  1335    PT Stop Time  1415    PT Time Calculation (min)  40 min       Past Medical History:  Diagnosis Date  . Arthritis    lower back, hands, knees  . Bipolar disorder ([place])   . Depression   . Dyspnea    with exertion  . GERD (gastroesophageal reflux disease)    patient unsure about this dx - no meds  . Headache   . History of IBS    watches diet  . Hyperlipidemia   . Hypertension   . Hypothyroidism   . Obese   . Sleep apnea 2017   uses CPAP   . SVD (spontaneous vaginal delivery)    x 2  . Thyroid disease     Past Surgical History:  Procedure Laterality Date  . carpel tunnel surgery Right   . CATARACT EXTRACTION Bilateral 2007   w/ lens implants  . COLONOSCOPY    . DILATION AND CURETTAGE OF UTERUS    . FOOT SURGERY Right    hammer toe  . LEG SURGERY Right   . TONSILLECTOMY    . WISDOM TOOTH EXTRACTION      There were no vitals filed for this visit.  Subjective Assessment - 08/23/17 1340    Subjective  I had injections in my back before the cruise. I think the back was better but I think the injection is wearing off.                        [facility] Adult PT Treatment/Exercise - 08/23/17 0001      Lumbar Exercises: Aerobic   Nustep  L3 LE x 5 minutes      Lumbar Exercises: Standing   Other Standing Lumbar Exercises  standing 3 way hip 2 sets of 5 each bialteral at counter       Lumbar Exercises: Seated   Sit to Stand  10 reps without UE from  standard chair     Other Seated Lumbar Exercises  physioball press for abdominal recruitment -ball in chair in front of patient- 5 sec x 10 cues for breathing               PT Short Term Goals - 07/19/17 1855      PT SHORT TERM GOAL #1   Title  Patient will demonstrate proper  technique for transfers to decrease stress on back in order to transition from sitting to lying down and sitting to standing without overuse of UE's     Baseline  pt with carpal tunnel on left hand and is trying to schedule surgery.     Time  5    Period  Weeks    Status  New    Target Date  08/23/17      PT SHORT TERM GOAL #2   Title  Patient will be independent with initial HEP and strengthening  program     Baseline  reports she is not consistent with exercise program in the past    Time  4    Period  Weeks    Status  New    Target Date  08/23/17      PT SHORT TERM GOAL #3   Title  Pt will state 3 pain management strategies or proper posture /body mechanics for household chores    Time  4    Period  Weeks    Status  New    Target Date  08/23/17      PT SHORT TERM GOAL #4   Title  Pt will be able to stand and walk for 400 feet with =/< 4/10 pain    Time  4    Period  Weeks    Status  New    Target Date  08/23/17      PT SHORT TERM GOAL #5   Target Date  --        PT Long Term Goals - 07/19/17 1501      PT LONG TERM GOAL #1   Title  Patient will ambulate 3000' without increased pain in order to go shopping and to walk for recreation     Baseline  Pt currently not in routine walking program    Time  10    Period  Weeks    Status  New    Target Date  09/27/17      PT LONG TERM GOAL #2   Title  Patient will stand for 20  min without increased pain in order to perform ADL's     Time  10    Period  Weeks    Status  New    Target Date  09/27/17      PT LONG TERM GOAL #3   Title  Patient wil be independent with a strengthening program to improve overall health     Time  10     Period  Weeks    Status  Deferred    Target Date  09/27/17      PT LONG TERM GOAL #4   Title  Pt will walk with at least 2.0 ft/ sec to be limited community ambulator    Baseline  Pt now at household ambulator level < 1.31 ft/sec    Time  10    Period  Weeks    Status  New    Target Date  09/27/17      PT LONG TERM GOAL #5   Title  "FOTO will improve from 64 % limitation   to   52% limitation  indicating improved functional mobility     Time  10    Period  Weeks    Status  New    Target Date  09/27/17      PT LONG TERM GOAL #6   Title  "Pt will improve lower extremity  strength throughout  to >/=  4- /5 with </= 4/10 pain to promote safety with walking/standing activities    Baseline  Pt grossly 2+/5 to 4-/5 for lower extremity     Time  10    Period  Weeks    Status  New    Target Date  09/27/17            Plan - 08/23/17 1526    Clinical Impression Statement  Pt arrives with rollator. She has not performed any HEP exercises. We reviewed all HEP today. She  was able to perform 5 minutes on the Nustep without rest break. She requires rest breaks during standing exercise due to fatigue and back pain.     PT Next Visit Plan  give basic stretches hip flex, hamstring stretch in sitting.  if pt cannot lie down,  Try to perform IR/ER stretch in sittting. heel to knee as able to provide some stretch.  Pt will be travelling for a month,add Basic stetches    PT Home Exercise Plan  posture sitting and standing. how to use rollator, sit-stand, standing way hip, seated clam red band     Consulted and Agree with Plan of Care  Patient       Patient will benefit from skilled therapeutic intervention in order to improve the following deficits and impairments:  Difficulty walking, Decreased range of motion, Decreased activity tolerance, Decreased mobility, Decreased knowledge of use of DME, Pain, Obesity, Impaired UE functional use, Improper body mechanics, Postural dysfunction, Impaired  sensation, Impaired flexibility, Increased muscle spasms, Increased fascial restricitons, Hypomobility, Decreased strength  Visit Diagnosis: No diagnosis found.     Problem List Patient Active Problem List   Diagnosis Date Noted  . Primary osteoarthritis of both hands 04/19/2017  . Primary osteoarthritis of both knees 04/19/2017  . Primary osteoarthritis of both feet 04/19/2017  . DDD (degenerative disc disease), lumbar 04/19/2017  . History of bipolar disorder 04/19/2017  . Hypothyroidism 06/04/2014  . Chest pain 01/23/2013  . ARF (acute renal failure) ([place]) 01/23/2013  . HTN (hypertension) 01/23/2013  . Leukocytosis 01/23/2013  . HLD (hyperlipidemia) 01/23/2013    Dorene Ar, PTA 08/23/2017, 3:49 PM  [facility] [address] [place], [place], [address] Phone: [phone]   Fax:  [phone]  Name: Jessica [name] MRN: [id number] Date of Birth: 03/12/1951

## 2017-08-24 ENCOUNTER — Other Ambulatory Visit: Payer: Self-pay | Admitting: Orthopedic Surgery

## 2017-08-24 ENCOUNTER — Encounter: Payer: Self-pay | Admitting: Neurology

## 2017-08-24 ENCOUNTER — Ambulatory Visit
Admission: RE | Admit: 2017-08-24 | Discharge: 2017-08-24 | Disposition: A | Discharge status: Home / Self Care | Payer: Medicare Other | Source: Ambulatory Visit | Attending: Family Medicine | Admitting: Family Medicine

## 2017-08-24 ENCOUNTER — Ambulatory Visit (INDEPENDENT_AMBULATORY_CARE_PROVIDER_SITE_OTHER): Payer: Medicare Other | Admitting: Neurology

## 2017-08-24 ENCOUNTER — Other Ambulatory Visit: Payer: Medicare Other

## 2017-08-24 VITALS — BP 110/68 | HR 92 | Ht 62.0 in | Wt 298.4 lb

## 2017-08-24 DIAGNOSIS — G629 Polyneuropathy, unspecified: Secondary | ICD-10-CM

## 2017-08-24 DIAGNOSIS — Z139 Encounter for screening, unspecified: Secondary | ICD-10-CM

## 2017-08-24 DIAGNOSIS — M4800 Spinal stenosis, site unspecified: Secondary | ICD-10-CM | POA: Diagnosis not present

## 2017-08-24 DIAGNOSIS — Z1231 Encounter for screening mammogram for malignant neoplasm of breast: Secondary | ICD-10-CM | POA: Diagnosis not present

## 2017-08-24 DIAGNOSIS — R202 Paresthesia of skin: Secondary | ICD-10-CM | POA: Diagnosis not present

## 2017-08-24 LAB — SEDIMENTATION RATE: Sed Rate: 29 mm/h (ref 0–30)

## 2017-08-24 LAB — VITAMIN B12: Vitamin B-12: 517 pg/mL (ref 200–1100)

## 2017-08-24 LAB — FOLATE: Folate: 19 ng/mL

## 2017-08-24 NOTE — Progress Notes (Signed)
[facility] Clinic Note - Initial Visit   Date: 08/24/17  Jessica [name] MRN: [id number] DOB: [birth year]   Dear Dr. Alroy Dust:  Thank you for your kind referral of Jessica [name] for consultation of bilateral feet tingling. Although her history is well known to you, please allow [place] to reiterate it for the purpose of our medical record. The patient was accompanied to the clinic by husband who also provides collateral information.     History of Present Illness: Jessica [name] is a 67 y.o. right-handed Caucasian female with hypertension, hyperlipidemia, hypothyroidism, bipolar disorder, OA, SIADH, OSA, overactive bladder, and low back pain presenting for evaluation of numbness/tingling of the feet.    Starting in 2018, she began having sensation of tingling over the toes which has progressed into numbness.  Numbness does not involve above the ankles.  She has some imbalance which she attributes to her low back pain.  She is followed by [facility] for low back pain and recently had ESI by Dr. Maia Petties. MRI lumbar spine from June 2018 showed spinal stenosis at L3-4 and bilateral foraminal stenosis at L4-S1. She has been walking with a rollator since November 2018.  She has not suffered any falls.  No history of diabetes, alcohol use, or family history of neuropathy.   She has EMG of the left arm by me in February which showed severe left CTS and is tentatively scheduled for surgery next month.    Out-side paper records, electronic medical record, and images have been reviewed where available and summarized as:  NCS/EMG of the left arm 06/16/2017:  Left median neuropathy at or distal to the wrist, consistent with the clinical diagnosis of carpal tunnel syndrome. Overall, these findings are very severe in degree electrically.  Labs 04/08/2018:  HbA1c 6.1, TSH 5.89*, ANA neg  MRI lumbar spine 10/08/2016: 1.Moderate spinal stenosis L3-4. 2.Mild spinal  stenosis L4-5. 3.Facet joint arthritis L5-S1 with moderate size bilateral facet joint effusions and patulous facet joints. 4.Moderate bilateral foraminal stenosis L5-S1.  MRI lumbar spine wo contrast 09/26/2013: 1. At L2-3 there is a mild broad-based disc bulge. 2. At L3-4 there is a mild broad-based disc bulge. Mild bilateral facet arthropathy with ligamentum flavum infolding resulting in mild spinal stenosis and mild bilateral lateral recess stenosis. 3. At L4-5 there is a mild broad-based disc bulge and moderate bilateral facet arthropathy. 4. At L5-S1 there is a mild broad-based disc bulge and mild bilateral facet arthropathy.  Past Medical History:  Diagnosis Date  . Arthritis    lower back, hands, knees  . Bipolar disorder ([place])   . Depression   . Dyspnea    with exertion  . GERD (gastroesophageal reflux disease)    patient unsure about this dx - no meds  . Headache   . History of IBS    watches diet  . Hyperlipidemia   . Hypertension   . Hypothyroidism   . Obese   . Sleep apnea 2017   uses CPAP   . SVD (spontaneous vaginal delivery)    x 2  . Thyroid disease     Past Surgical History:  Procedure Laterality Date  . carpel tunnel surgery Right   . CATARACT EXTRACTION Bilateral 2007   w/ lens implants  . COLONOSCOPY    . DILATION AND CURETTAGE OF UTERUS    . FOOT SURGERY Right    hammer toe  . LEG SURGERY Right   . TONSILLECTOMY    . WISDOM TOOTH  EXTRACTION       Medications:  Outpatient Encounter Medications as of 08/24/2017  Medication Sig  . acetaminophen (TYLENOL ARTHRITIS PAIN) 650 MG CR tablet Take 1,300 mg every 8 (eight) hours as needed by mouth for pain.   . Acetylcysteine (NAC) 600 MG CAPS Take 600 mg by mouth 2 (two) times daily.  Marland Kitchen amLODipine (NORVASC) 5 MG tablet Take 5 mg at bedtime by mouth.  . Carboxymethylcellulose Sod PF (RETAINE CMC) 0.5 % SOLN Place 1 drop every 2 (two) hours as needed into both eyes (dry eyes).  . celecoxib  (CELEBREX) 200 MG capsule Take 1 capsule (200 mg total) by mouth daily.  . Cholecalciferol (VITAMIN D) 2000 units CAPS Take 2,000 Units daily by mouth.  . citalopram (CELEXA) 20 MG tablet Take 30 mg at bedtime by mouth.   . cycloSPORINE (RESTASIS) 0.05 % ophthalmic emulsion Place 1 drop into both eyes 2 (two) times daily.  . DULoxetine (CYMBALTA) 60 MG capsule Take 1 capsule by mouth daily.  . furosemide (LASIX) 20 MG tablet TAKE 1 TABLET BY MOUTH EVERY DAY  . levothyroxine (SYNTHROID) 25 MCG tablet Synthroid 25 mcg tablet  . levothyroxine (SYNTHROID, LEVOTHROID) 150 MCG tablet Take 250 mcg by mouth daily before breakfast.   . lisdexamfetamine (VYVANSE) 60 MG capsule Take 60 mg by mouth every morning.  . lithium carbonate (LITHOBID) 300 MG CR tablet Take 300-600 mg by mouth 3 (three) times daily. 331m in the AM and 6047mat bedtime.  . Marland Kitchenoratadine (CLARITIN) 10 MG tablet Take 10 mg at bedtime by mouth.  . Marland KitchenORazepam (ATIVAN) 1 MG tablet lorazepam 1 mg tablet  TAKE 1 TO 2 TABLETS BY MOUTH AS NEEDED FOR ANXIETY  . methocarbamol (ROBAXIN) 750 MG tablet Take 500 mg by mouth 3 (three) times daily as needed for muscle spasms.   . mirabegron ER (MYRBETRIQ) 50 MG TB24 tablet Take 50 mg at bedtime by mouth.   . Multiple Vitamins-Minerals (MULTIVITAMIN WITH MINERALS) tablet Take 1 tablet by mouth daily.  . Multiple Vitamins-Minerals (PRESERVISION AREDS 2) CAPS Take 1 capsule 2 (two) times daily by mouth.  . pravastatin (PRAVACHOL) 20 MG tablet Take 20 mg at bedtime by mouth.   . SYNTHROID 200 MCG tablet Take 200 mg by mouth daily.  . [DISCONTINUED] HYDROcodone-acetaminophen (NORCO/VICODIN) 5-325 MG tablet Take 1 tablet every 6 (six) hours as needed by mouth for pain.  . [DISCONTINUED] metaxalone (SKELAXIN) 800 MG tablet Take 1 tablet (800 mg total) by mouth 3 (three) times daily. (Patient not taking: Reported on 03/07/2017)  . [DISCONTINUED] methocarbamol (ROBAXIN) 500 MG tablet TAKE 0.5 TO 1 TABLET UP TO 3  TIMES DAILY AS NEEDED FOR MUSCLE SPASMS. (Patient not taking: Reported on 07/19/2017)  . [DISCONTINUED] zolpidem (AMBIEN) 5 MG tablet Take 5 mg at bedtime as needed by mouth for sleep.   No facility-administered encounter medications on file as of 08/24/2017.      Allergies:  Allergies  Allergen Reactions  . Amoxicillin-Pot Clavulanate Nausea And Vomiting and Other (See Comments)    Vomiting  . Codeine Other (See Comments)  . Erythromycin Base Diarrhea and Other (See Comments)  . Ibuprofen Diarrhea, Other (See Comments) and Nausea Only    Extreme stomach pain  Extreme stomach pain  Makes her feel bad  . Nabumetone Other (See Comments)  . Promethazine Other (See Comments)  . Promethazine-Codeine Other (See Comments)    Makes her feel bad    Family History: Family History  Problem Relation Age of Onset  .  Thyroid disease Mother   . Breast cancer Mother   . Heart disease Mother   . Bipolar disorder Father   . Diabetes Father   . Heart disease Father   . Dementia Father     Social History: Social History   Tobacco Use  . Smoking status: Never Smoker  . Smokeless tobacco: Never Used  Substance Use Topics  . Alcohol use: Yes    Comment: rarely  . Drug use: No   Social History   Social History Narrative   She lives in a single level home with her husband.  She has two grown children.   She taught college accounting courses, retired in 2005.    Highest level of education:  Doctorate in accounting    Review of Systems:  CONSTITUTIONAL: No fevers, chills, night sweats, or weight loss.   EYES: No visual changes or eye pain ENT: No hearing changes.  No history of nose bleeds.   RESPIRATORY: No cough, wheezing and shortness of breath.   CARDIOVASCULAR: Negative for chest pain, and palpitations.   GI: Negative for abdominal discomfort, blood in stools or black stools.  No recent change in bowel habits.   GU:  No history of incontinence.   MUSCLOSKELETAL: +history of  joint pain +swelling.  No myalgias.   SKIN: Negative for lesions, rash, and itching.   HEMATOLOGY/ONCOLOGY: Negative for prolonged bleeding, bruising easily, and swollen nodes.  No history of cancer.   ENDOCRINE: Negative for cold or heat intolerance, polydipsia or goiter.   PSYCH:  +depression or anxiety symptoms.   NEURO: As Above.   Vital Signs:  BP 110/68   Pulse 92   Ht '5\' 2"'  (1.575 m)   Wt 298 lb 6 oz (135.3 kg)   SpO2 96%   BMI 54.57 kg/m    General Medical Exam:   General:  Well appearing, morbidly obese, comfortable.   Eyes/ENT: see cranial nerve examination.   Neck: No masses appreciated.  Full range of motion without tenderness.  No carotid bruits. Respiratory:  Clear to auscultation, good air entry bilaterally.   Cardiac:  Regular rate and rhythm, no murmur.   Extremities:  No deformities, pitting edema.  Skin:  No rashes or lesions.  Neurological Exam: MENTAL STATUS including orientation to time, place, person, recent and remote memory, attention span and concentration, language, and fund of knowledge is normal.  Speech is not dysarthric.  CRANIAL NERVES: II:  No visual field defects.  Unremarkable fundi.   III-IV-VI: Pupils equal round and reactive to light.  Normal conjugate, extra-ocular eye movements in all directions of gaze.  No nystagmus.  No ptosis.   V:  Normal facial sensation.     VII:  Normal facial symmetry and movements.   VIII:  Normal hearing and vestibular function.   IX-X:  Normal palatal movement.   XI:  Normal shoulder shrug and head rotation.   XII:  Normal tongue strength and range of motion, no deviation or fasciculation.  MOTOR:  No atrophy, fasciculations or abnormal movements.  No pronator drift.  Tone is normal.    Right Upper Extremity:    Left Upper Extremity:    Deltoid  5/5   Deltoid  5/5   Biceps  5/5   Biceps  5/5   Triceps  5/5   Triceps  5/5   Wrist extensors  5/5   Wrist extensors  5/5   Wrist flexors  5/5   Wrist flexors   5/5   Finger extensors  5/5   Finger extensors  5/5   Finger flexors  5/5   Finger flexors  5/5   Dorsal interossei  5/5   Dorsal interossei  5/5   Abductor pollicis  5/5   Abductor pollicis  5/5   Tone (Ashworth scale)  0  Tone (Ashworth scale)  0   Right Lower Extremity:    Left Lower Extremity:    Hip flexors  5/5   Hip flexors  5/5   Hip extensors  5/5   Hip extensors  5/5   Knee flexors  5/5   Knee flexors  5/5   Knee extensors  5/5   Knee extensors  5/5   Dorsiflexors  5/5   Dorsiflexors  5/5   Plantarflexors  5/5   Plantarflexors  5/5   Toe extensors  5/5   Toe extensors  5/5   Toe flexors  5/5   Toe flexors  5/5   Tone (Ashworth scale)  0  Tone (Ashworth scale)  0   MSRs:  Right                                                                 Left brachioradialis 2+  brachioradialis 2+  biceps 2+  biceps 2+  triceps 2+  triceps 2+  patellar 2+  patellar 2+  ankle jerk 1+  ankle jerk 1+  Hoffman no  Hoffman no  plantar response down  plantar response down   SENSORY: Vibration is reduced at the left knee and ankle, temperature is reduced over the feet bilaterally, pin prick and light touch is intact.  Romberg's sign absent.   COORDINATION/GAIT: Normal finger-to- nose-finger.  Intact rapid alternating movements bilaterally.  Gait is wide-based due to body habitus.   IMPRESSION: Bilateral feet paresthesias.  She most likely has early signs of peripheral neuropathy however with her known foraminal stenosis at L5-S1, sensory radiculopathy cannot be excluded.  Recommend NCS/EMG of the legs to better characterize the nature of her symptoms.  Check ESR, CRP, vitamin B12, folate, copper, SPEP with IFE for treatable causes of neuropathy.  It was explained that in the vast majority of cases, there is no treatable etiology and management is symptomatic.  She predominately complains of numbness for which there are no effective medications.  She may benefit from PT for gait training going  forward, based on the results of his EDX.  Further recommendations pending results  Thank you for allowing me to participate in patient's care.  If I can answer any additional questions, I would be pleased to do so.    Sincerely,    Latoia Eyster K. Posey Pronto, DO

## 2017-08-24 NOTE — Patient Instructions (Signed)
Check labs  NCS/EMG of the legs

## 2017-08-25 ENCOUNTER — Ambulatory Visit: Payer: Medicare Other | Admitting: Physical Therapy

## 2017-08-25 ENCOUNTER — Encounter: Payer: Self-pay | Admitting: Physical Therapy

## 2017-08-25 DIAGNOSIS — M545 Low back pain: Secondary | ICD-10-CM | POA: Diagnosis not present

## 2017-08-25 DIAGNOSIS — R262 Difficulty in walking, not elsewhere classified: Secondary | ICD-10-CM | POA: Diagnosis not present

## 2017-08-25 DIAGNOSIS — M62830 Muscle spasm of back: Secondary | ICD-10-CM | POA: Diagnosis not present

## 2017-08-25 DIAGNOSIS — H353131 Nonexudative age-related macular degeneration, bilateral, early dry stage: Secondary | ICD-10-CM | POA: Diagnosis not present

## 2017-08-25 DIAGNOSIS — M5416 Radiculopathy, lumbar region: Secondary | ICD-10-CM | POA: Diagnosis not present

## 2017-08-25 DIAGNOSIS — G8929 Other chronic pain: Secondary | ICD-10-CM

## 2017-08-25 DIAGNOSIS — H04123 Dry eye syndrome of bilateral lacrimal glands: Secondary | ICD-10-CM | POA: Diagnosis not present

## 2017-08-25 DIAGNOSIS — M6281 Muscle weakness (generalized): Secondary | ICD-10-CM | POA: Diagnosis not present

## 2017-08-25 DIAGNOSIS — H524 Presbyopia: Secondary | ICD-10-CM | POA: Diagnosis not present

## 2017-08-25 DIAGNOSIS — H52223 Regular astigmatism, bilateral: Secondary | ICD-10-CM | POA: Diagnosis not present

## 2017-08-25 LAB — COPPER, SERUM: Copper: 112 ug/dL (ref 70–175)

## 2017-08-25 LAB — C-REACTIVE PROTEIN: CRP: 2.8 mg/L (ref <8.0)

## 2017-08-25 NOTE — Therapy (Addendum)
[place], [place], [address] Phone: [phone]   Fax:  [phone]  Physical Therapy Treatment  Patient Details  Name: Jessica [name] MRN: [id number] Date of Birth: Apr 15, 1951 Referring Provider: Donavan Burnet MD   Encounter Date: 08/25/2017  PT End of Session - 08/25/17 1156    Visit Number  4    Number of Visits  16    Date for PT Re-Evaluation  09/27/17    Authorization Type  Medicare    PT Start Time  1150    PT Stop Time  1232    PT Time Calculation (min)  42 min    Activity Tolerance  Patient tolerated treatment well    Behavior During Therapy  [facility] for tasks assessed/performed       Past Medical History:  Diagnosis Date  . Arthritis    lower back, hands, knees  . Bipolar disorder ([place])   . Depression   . Dyspnea    with exertion  . GERD (gastroesophageal reflux disease)    patient unsure about this dx - no meds  . Headache   . History of IBS    watches diet  . Hyperlipidemia   . Hypertension   . Hypothyroidism   . Obese   . Sleep apnea 2017   uses CPAP   . SVD (spontaneous vaginal delivery)    x 2  . Thyroid disease     Past Surgical History:  Procedure Laterality Date  . carpel tunnel surgery Right   . CATARACT EXTRACTION Bilateral 2007   w/ lens implants  . COLONOSCOPY    . DILATION AND CURETTAGE OF UTERUS    . FOOT SURGERY Right    hammer toe  . LEG SURGERY Right   . TONSILLECTOMY    . WISDOM TOOTH EXTRACTION      There were no vitals filed for this visit.  Subjective Assessment - 08/25/17 1159    Subjective  Patient reports that when she is sitting her back feels okay but when standing or walking her pain goes up to a 4/10 today. Patient expresses concerns over how she will rise from sit to stand using the rollator when post L wrist carpal tunnel surgery.     Pertinent History  Right carpal tunnel surgery > 20 years, sleep apnea, back pain history, morbid  obesity    Limitations  Sitting    How long can you sit comfortably?  30 minutes but I need to shift    How long can you stand comfortably?  < 5 minutes    How long can you walk comfortably?  < 5 minutes    Diagnostic tests  MRI    Patient Stated Goals  I would like to relieve back pain and make sure I am using the rollator properly.      Pain Score  4  when walking     Pain Location  Back    Pain Orientation  Right;Left    Pain Descriptors / Indicators  Aching    Pain Type  Chronic pain    Pain Onset  More than a month ago    Pain Frequency  Intermittent    Aggravating Factors   standing longer than 5 mins or walking                        [facility] Adult PT Treatment/Exercise - 08/25/17 0001      Lumbar Exercises:  Aerobic   Nustep  L3 LE x 5 minutes      Lumbar Exercises: Seated   Other Seated Lumbar Exercises  physioball press for abdominal recruitment -ball in chair in front of patient- 5 sec x 10 cues for breathing; ball roll out 2x10     Other Seated Lumbar Exercises  ER 2x10 yellow; scapular pinches 2x5; Clamshell 2x10 yellow             PT Education - 08/25/17 1247    Education provided  Yes    Education Details  Trigger point referral patterns; Plateform walker     Methods  Explanation;Demonstration;Tactile cues;Verbal cues    Comprehension  Verbalized understanding;Need further instruction       PT Short Term Goals - 07/19/17 1855      PT SHORT TERM GOAL #1   Title  Patient will demonstrate proper  technique for transfers to decrease stress on back in order to transition from sitting to lying down and sitting to standing without overuse of UE's     Baseline  pt with carpal tunnel on left hand and is trying to schedule surgery.     Time  5    Period  Weeks    Status  New    Target Date  08/23/17      PT SHORT TERM GOAL #2   Title  Patient will be independent with initial HEP and strengthening program     Baseline  reports she is not  consistent with exercise program in the past    Time  4    Period  Weeks    Status  New    Target Date  08/23/17      PT SHORT TERM GOAL #3   Title  Pt will state 3 pain management strategies or proper posture /body mechanics for household chores    Time  4    Period  Weeks    Status  New    Target Date  08/23/17      PT SHORT TERM GOAL #4   Title  Pt will be able to stand and walk for 400 feet with =/< 4/10 pain    Time  4    Period  Weeks    Status  New    Target Date  08/23/17      PT SHORT TERM GOAL #5   Target Date  --        PT Long Term Goals - 07/19/17 1501      PT LONG TERM GOAL #1   Title  Patient will ambulate 3000' without increased pain in order to go shopping and to walk for recreation     Baseline  Pt currently not in routine walking program    Time  10    Period  Weeks    Status  New    Target Date  09/27/17      PT LONG TERM GOAL #2   Title  Patient will stand for 20  min without increased pain in order to perform ADL's     Time  10    Period  Weeks    Status  New    Target Date  09/27/17      PT LONG TERM GOAL #3   Title  Patient wil be independent with a strengthening program to improve overall health     Time  10    Period  Weeks    Status  Deferred    Target  Date  09/27/17      PT LONG TERM GOAL #4   Title  Pt will walk with at least 2.0 ft/ sec to be limited community ambulator    Baseline  Pt now at household ambulator level < 1.31 ft/sec    Time  10    Period  Weeks    Status  New    Target Date  09/27/17      PT LONG TERM GOAL #5   Title  "FOTO will improve from 64 % limitation   to   52% limitation  indicating improved functional mobility     Time  10    Period  Weeks    Status  New    Target Date  09/27/17      PT LONG TERM GOAL #6   Title  "Pt will improve lower extremity  strength throughout  to >/=  4- /5 with </= 4/10 pain to promote safety with walking/standing activities    Baseline  Pt grossly 2+/5 to 4-/5 for lower  extremity     Time  10    Period  Weeks    Status  New    Target Date  09/27/17            Plan - 08/25/17 1157    Clinical Impression Statement  Therapy started patient on the Nustep for 5 mins which she was able to complete without resting. Patient tolerated physioball stretches and core stability exercises well. Therapy added postrual exercises. Patient expressed concern about how she will be able to rise from sit to stand after her upcoming carpal tunnel suregery. Therapy disccused the option of a platform walker and provided a demostration. Patienet tolerated all treatement well and was encouraged to continue HEP.     Clinical Presentation  Evolving    Clinical Decision Making  Moderate    Rehab Potential  Fair    PT Frequency  2x / week    PT Duration  12 weeks    PT Next Visit Plan  give basic stretches hip flex, hamstring stretch in sitting.  if pt cannot lie down,  Try to perform IR/ER stretch in sittting. heel to knee as able to provide some stretch.  Pt will be travelling for a month,add Basic stetches    PT Home Exercise Plan  posture sitting and standing. how to use rollator, sit-stand, standing way hip, seated clam red band     Consulted and Agree with Plan of Care  Patient       Patient will benefit from skilled therapeutic intervention in order to improve the following deficits and impairments:  Difficulty walking, Decreased range of motion, Decreased activity tolerance, Decreased mobility, Decreased knowledge of use of DME, Pain, Obesity, Impaired UE functional use, Improper body mechanics, Postural dysfunction, Impaired sensation, Impaired flexibility, Increased muscle spasms, Increased fascial restricitons, Hypomobility, Decreased strength  Visit Diagnosis: Chronic bilateral low back pain without sciatica  Muscle spasm of back  Difficulty in walking, not elsewhere classified  Muscle weakness (generalized)  Radiculopathy, lumbar region     Problem  List Patient Active Problem List   Diagnosis Date Noted  . Primary osteoarthritis of both hands 04/19/2017  . Primary osteoarthritis of both knees 04/19/2017  . Primary osteoarthritis of both feet 04/19/2017  . DDD (degenerative disc disease), lumbar 04/19/2017  . History of bipolar disorder 04/19/2017  . Hypothyroidism 06/04/2014  . Chest pain 01/23/2013  . ARF (acute renal failure) ([place]) 01/23/2013  . HTN (hypertension)  01/23/2013  . Leukocytosis 01/23/2013  . HLD (hyperlipidemia) 01/23/2013   Carolyne Littles PT DPT  08/25/2017   Cooper Render SPT  08/25/2017, 12:55 PM  During this treatment session, the therapist was present, participating in and directing the treatment.   [place], [place], [address] Phone: [phone]   Fax:  [phone]  Name: EVEREST HACKING MRN: [id number] Date of Birth: Sep 28, 1950

## 2017-08-26 DIAGNOSIS — G4733 Obstructive sleep apnea (adult) (pediatric): Secondary | ICD-10-CM | POA: Diagnosis not present

## 2017-08-26 DIAGNOSIS — Z9989 Dependence on other enabling machines and devices: Secondary | ICD-10-CM | POA: Diagnosis not present

## 2017-08-26 DIAGNOSIS — G4730 Sleep apnea, unspecified: Secondary | ICD-10-CM | POA: Diagnosis not present

## 2017-08-26 LAB — PROTEIN ELECTROPHORESIS, SERUM
Albumin ELP: 3.8 g/dL (ref 3.8–4.8)
Alpha 1: 0.3 g/dL (ref 0.2–0.3)
Alpha 2: 0.9 g/dL (ref 0.5–0.9)
Beta 2: 0.3 g/dL (ref 0.2–0.5)
Beta Globulin: 0.4 g/dL (ref 0.4–0.6)
Gamma Globulin: 0.7 g/dL — ABNORMAL LOW (ref 0.8–1.7)
Total Protein: 6.4 g/dL (ref 6.1–8.1)

## 2017-08-26 LAB — IMMUNOFIXATION ELECTROPHORESIS
IgG (Immunoglobin G), Serum: 740 mg/dL (ref 694–1618)
IgM, Serum: 62 mg/dL (ref 48–271)
Immunofix Electr Int: NOT DETECTED
Immunoglobulin A: 158 mg/dL (ref 81–463)

## 2017-08-29 ENCOUNTER — Encounter: Payer: Self-pay | Admitting: Physical Therapy

## 2017-08-29 ENCOUNTER — Ambulatory Visit: Payer: Medicare Other | Admitting: Physical Therapy

## 2017-08-29 DIAGNOSIS — M545 Low back pain, unspecified: Secondary | ICD-10-CM

## 2017-08-29 DIAGNOSIS — R262 Difficulty in walking, not elsewhere classified: Secondary | ICD-10-CM | POA: Diagnosis not present

## 2017-08-29 DIAGNOSIS — M6281 Muscle weakness (generalized): Secondary | ICD-10-CM

## 2017-08-29 DIAGNOSIS — G8929 Other chronic pain: Secondary | ICD-10-CM

## 2017-08-29 DIAGNOSIS — M62830 Muscle spasm of back: Secondary | ICD-10-CM | POA: Diagnosis not present

## 2017-08-29 DIAGNOSIS — M5416 Radiculopathy, lumbar region: Secondary | ICD-10-CM

## 2017-08-29 NOTE — Therapy (Addendum)
[place], [place], [address] Phone: [phone]   Fax:  [phone]  Physical Therapy Treatment  Patient Details  Name: Jessica [name] MRN: [id number] Date of Birth: [birth year] Referring Provider: Donavan Burnet MD   Encounter Date: 08/29/2017  PT End of Session - 08/29/17 1432    Visit Number  5    Number of Visits  16    Date for PT Re-Evaluation  09/27/17    Authorization Type  Medicare    PT Start Time  2979    PT Stop Time  1500    PT Time Calculation (min)  32 min       Past Medical History:  Diagnosis Date  . Arthritis    lower back, hands, knees  . Bipolar disorder ([place])   . Depression   . Dyspnea    with exertion  . GERD (gastroesophageal reflux disease)    patient unsure about this dx - no meds  . Headache   . History of IBS    watches diet  . Hyperlipidemia   . Hypertension   . Hypothyroidism   . Obese   . Sleep apnea 2017   uses CPAP   . SVD (spontaneous vaginal delivery)    x 2  . Thyroid disease     Past Surgical History:  Procedure Laterality Date  . carpel tunnel surgery Right   . CATARACT EXTRACTION Bilateral 2007   w/ lens implants  . COLONOSCOPY    . DILATION AND CURETTAGE OF UTERUS    . FOOT SURGERY Right    hammer toe  . LEG SURGERY Right   . TONSILLECTOMY    . WISDOM TOOTH EXTRACTION      There were no vitals filed for this visit.  Subjective Assessment - 08/29/17 1428    Subjective  Patient reports that she is doing okay today. Her back pain is at a 2/10. Patient continues to express concerns of post-surgical care like rising from bed after her carpal tunnel surgery.      Pertinent History  Right carpal tunnel surgery > 20 years, sleep apnea, back pain history, morbid obesity    Limitations  Sitting    How long can you sit comfortably?  30 minutes but I need to shift    How long can you stand comfortably?  < 5 minutes    How long can you walk  comfortably?  < 5 minutes    Diagnostic tests  MRI    Patient Stated Goals  I would like to relieve back pain and make sure I am using the rollator properly.      Currently in Pain?  Yes    Pain Score  2     Pain Location  Back    Pain Orientation  Right;Left    Pain Descriptors / Indicators  Aching    Pain Type  Chronic pain    Pain Onset  More than a month ago    Pain Frequency  Intermittent    Aggravating Factors   standing longet than 5 mins or walking                        [facility] Adult PT Treatment/Exercise - 08/29/17 0001      Self-Care   Other Self-Care Comments   Reviewed and practiced proper technique to rise from bed on the right side for post-carpal tunnel surgery; pushing off of right elbow  Lumbar Exercises: Stretches   Active Hamstring Stretch  2 reps;Right;Left;20 seconds      Lumbar Exercises: Aerobic   Nustep  L3 LE x 5 minutes      Lumbar Exercises: Seated   Other Seated Lumbar Exercises  physioball press for abdominal recruitment -ball in chair in front of patient- 5 sec x 10 cues for breathing; ball roll out 2x10  increased uncomfortable sensation in the left hand     Other Seated Lumbar Exercises  ball squeeze 2x10; clam yello 2x10              PT Education - 08/29/17 1603    Education provided  Yes    Education Details  Importance of hip strengthening and core stability for LBP pain     [name](s) Educated  Patient    Methods  Explanation;Demonstration;Verbal cues    Comprehension  Verbalized understanding       PT Short Term Goals - 08/29/17 1613      PT SHORT TERM GOAL #1   Title  Patient will demonstrate proper  technique for transfers to decrease stress on back in order to transition from sitting to lying down and sitting to standing without overuse of UE's     Baseline  therapy is reviewing techniques for patient to transfer. pt needs further review and strengthing     Time  5    Period  Weeks    Status  On-going     Target Date  08/23/17      PT SHORT TERM GOAL #2   Title  Patient will be independent with initial HEP and strengthening program     Baseline  patient needs further review of HEP and strengthening program     Time  4    Period  Weeks    Status  On-going    Target Date  08/23/17      PT SHORT TERM GOAL #3   Title  Pt will state 3 pain management strategies or proper posture /body mechanics for household chores    Baseline  Therapy continues to review pain management strategies     Time  4    Period  Weeks    Target Date  08/23/17      PT SHORT TERM GOAL #4   Title  Pt will be able to stand and walk for 400 feet with =/< 4/10 pain    Baseline  164 ft prior to seated break due to pain    Time  4    Period  Weeks    Status  On-going    Target Date  08/23/17        PT Long Term Goals - 07/19/17 1501      PT LONG TERM GOAL #1   Title  Patient will ambulate 3000' without increased pain in order to go shopping and to walk for recreation     Baseline  Pt currently not in routine walking program    Time  10    Period  Weeks    Status  New    Target Date  09/27/17      PT LONG TERM GOAL #2   Title  Patient will stand for 20  min without increased pain in order to perform ADL's     Time  10    Period  Weeks    Status  New    Target Date  09/27/17      PT LONG TERM GOAL #3  Title  Patient wil be independent with a strengthening program to improve overall health     Time  10    Period  Weeks    Status  Deferred    Target Date  09/27/17      PT LONG TERM GOAL #4   Title  Pt will walk with at least 2.0 ft/ sec to be limited community ambulator    Baseline  Pt now at household ambulator level < 1.31 ft/sec    Time  10    Period  Weeks    Status  New    Target Date  09/27/17      PT LONG TERM GOAL #5   Title  "FOTO will improve from 64 % limitation   to   52% limitation  indicating improved functional mobility     Time  10    Period  Weeks    Status  New    Target Date   09/27/17      PT LONG TERM GOAL #6   Title  "Pt will improve lower extremity  strength throughout  to >/=  4- /5 with </= 4/10 pain to promote safety with walking/standing activities    Baseline  Pt grossly 2+/5 to 4-/5 for lower extremity     Time  10    Period  Weeks    Status  New    Target Date  09/27/17            Plan - 08/29/17 1610    Clinical Impression Statement  Patient expressed concerns with transfering from the bed without increasing her LBP after she has her carpal tunnel suregery. Therapy reviewed different techniques for patient to rise from the right side. Therapy will continue to work on core strengthening and hip mobility in order for patient to more easily transfer. Patient tolerated all therapy treatment well.     Clinical Presentation  Evolving    Clinical Decision Making  Moderate    Rehab Potential  Fair    PT Frequency  2x / week    PT Duration  12 weeks    PT Treatment/Interventions  ADLs/Self Care Home Management;Cryotherapy;Electrical Stimulation;Iontophoresis 4mg /ml Dexamethasone;Moist Heat;Dry needling;Taping;Passive range of motion;Patient/family education;Manual techniques;Neuromuscular re-education;Therapeutic exercise;Therapeutic activities;Functional mobility training;Stair training;Gait training;Ultrasound;Traction;DME Instruction    PT Next Visit Plan  give basic stretches hip flex, hamstring stretch in sitting.  if pt cannot lie down,  Try to perform IR/ER stretch in sittting. heel to knee as able to provide some stretch.  Pt will be travelling for a month,add Basic stetches    PT Home Exercise Plan  posture sitting and standing. how to use rollator, sit-stand, standing way hip, seated clam red band     Consulted and Agree with Plan of Care  Patient       Patient will benefit from skilled therapeutic intervention in order to improve the following deficits and impairments:  Difficulty walking, Decreased range of motion, Decreased activity  tolerance, Decreased mobility, Decreased knowledge of use of DME, Pain, Obesity, Impaired UE functional use, Improper body mechanics, Postural dysfunction, Impaired sensation, Impaired flexibility, Increased muscle spasms, Increased fascial restricitons, Hypomobility, Decreased strength  Visit Diagnosis: Chronic bilateral low back pain without sciatica  Muscle spasm of back  Difficulty in walking, not elsewhere classified  Muscle weakness (generalized)  Radiculopathy, lumbar region     Problem List Patient Active Problem List   Diagnosis Date Noted  . Primary osteoarthritis of both hands 04/19/2017  . Primary osteoarthritis of  both knees 04/19/2017  . Primary osteoarthritis of both feet 04/19/2017  . DDD (degenerative disc disease), lumbar 04/19/2017  . History of bipolar disorder 04/19/2017  . Hypothyroidism 06/04/2014  . Chest pain 01/23/2013  . ARF (acute renal failure) ([place]) 01/23/2013  . HTN (hypertension) 01/23/2013  . Leukocytosis 01/23/2013  . HLD (hyperlipidemia) 01/23/2013   Carolyne Littles PT DPT   08/29/2017  Cooper Render SPT  08/29/2017, 4:24 PM During this treatment session, the therapist was present, participating in and directing the treatment.  [place], [place], [address] Phone: [phone]   Fax:  [phone]  Name: Jessica [name] MRN: [id number] Date of Birth: 11-25-1950

## 2017-08-30 ENCOUNTER — Ambulatory Visit (INDEPENDENT_AMBULATORY_CARE_PROVIDER_SITE_OTHER): Payer: Medicare Other | Admitting: Neurology

## 2017-08-30 DIAGNOSIS — G629 Polyneuropathy, unspecified: Secondary | ICD-10-CM | POA: Diagnosis not present

## 2017-08-31 ENCOUNTER — Ambulatory Visit: Payer: Medicare Other | Admitting: Physical Therapy

## 2017-08-31 NOTE — Procedures (Signed)
[facility]  [place], [place]  [place], [place] [address] Tel: [phone] Fax:  [phone] Test Date:  08/30/2017  Patient: Jessica [name] DOB: 25-Apr-1951 Physician: Narda Amber, DO  Sex: Female Height: 5\' 2"  Ref Phys: Narda Amber, DO  ID#: [id number] Temp: 32.0C Technician:    Patient Complaints: This is a 67 year old female referred for evaluation of bilateral paresthesia of the feet.  NCV & EMG Findings: Extensive electrodiagnostic testing of the right lower extremity and additional studies of the left shows:  1. Bilateral sural and superficial peroneal sensory responses are absent. 2. Left peroneal and tibial motor responses are absent. Left peroneal motor response at the tibialis anterior and right peroneal motor response at the extensor digitorum brevis shows reduced amplitude. Right tibial motor responses within normal limits. 3. Tibial H reflex study is within normal limits. 4. Chronic motor axon loss changes are seen affecting the muscles below the knee bilaterally, without accompanied active denervation.  Impression: The electrophysiologic most consistent with a chronic sensorimotor axonal polyneuropathy affecting the lower extremities, worse on the left.   ___________________________ Narda Amber, DO    Nerve Conduction Studies Anti Sensory Summary Table   Site NR Peak (ms) Norm Peak (ms) P-T Amp (V) Norm P-T Amp  Left Sup Peroneal Anti Sensory (Ant Lat Mall)  32C  12 cm NR  <4.6  >3  Left Sup Peroneal Anti Sensory (Ant Lat Mall)  12 cm NR  <4.6  >3  Right Sup Peroneal Anti Sensory (Ant Lat Mall)  12 cm NR  <4.6  >3  Right Sural Anti Sensory (Lat Mall)  32C  Calf NR  <4.6  >3   Motor Summary Table   Site NR Onset (ms) Norm Onset (ms) O-P Amp (mV) Norm O-P Amp Site1 Site2 Delta-0 (ms) Dist (cm) Vel (m/s) Norm Vel (m/s)  Left Peroneal Motor (Ext Dig Brev)  32C  Ankle NR  <6.0  >2.5 B Fib Ankle  0.0  >40  B Fib NR     Poplt B Fib   0.0  >40  Poplt NR            Right Peroneal Motor (Ext Dig Brev)  Ankle    2.8 <6.0 2.3 >2.5 B Fib Ankle 8.8 37.0 42 >40  B Fib    11.6  1.8  Poplt B Fib 0.9 8.0 89 >40  Poplt    12.5  1.7         Left Peroneal TA Motor (Tib Ant)  32C  Fib Head    3.0 <4.5 2.4 >3 Poplit Fib Head 1.1 8.0 73 >40  Poplit    4.1  2.0         Right Peroneal TA Motor (Tib Ant)  32C  Fib Head    3.1 <4.5 3.9 >3 Poplit Fib Head 1.3 8.0 62 >40  Poplit    4.4  3.8         Left Tibial Motor (Abd Hall Brev)  32C    body habitus behind knee  Ankle NR  <6.0  >4 Knee Ankle  0.0  >40  Knee NR            Right Tibial Motor (Abd Hall Brev)  32C    body habitus behind knee  Ankle    3.3 <6.0 5.2 >4 Knee Ankle 8.6 40.0 47 >40  Knee    11.9  1.2          H Reflex Studies  NR H-Lat (ms) Lat Norm (ms) L-R H-Lat (ms)  Right Tibial (Gastroc)  32C     34.15 <35    EMG   Side Muscle Ins Act Fibs Psw Fasc Number Recrt Dur Dur. Amp Amp. Poly Poly. Comment  Right AntTibialis Nml Nml Nml Nml 1- Rapid Few 1+ Few 1+ Nml Nml N/A  Left AntTibialis Nml Nml Nml Nml 1- Rapid Few 1+ Few 1+ Nml Nml N/A  Left Flex Dig Long Nml Nml Nml Nml 1- Rapid Few 1+ Few 1+ Nml Nml N/A  Left Gastroc Nml Nml Nml Nml Nml Nml Nml Nml Nml Nml Nml Nml N/A  Right Gastroc Nml Nml Nml Nml Nml Nml Nml Nml Nml Nml Nml Nml N/A  Right Flex Dig Long Nml Nml Nml Nml 1- Rapid Few 1+ Few 1+ Nml Nml N/A  Right RectFemoris Nml Nml Nml Nml Nml Nml Nml Nml Nml Nml Nml Nml N/A  Right GluteusMed Nml Nml Nml Nml Nml Nml Nml Nml Nml Nml Nml Nml N/A      Waveforms:

## 2017-09-01 ENCOUNTER — Telehealth: Payer: Self-pay | Admitting: *Deleted

## 2017-09-01 NOTE — Telephone Encounter (Signed)
Patient given results but declined PT at this time.

## 2017-09-01 NOTE — Telephone Encounter (Signed)
-----   Message from Alda Berthold, DO sent at 08/31/2017 12:36 PM EDT ----- Please inform patient that nerve testing confirmed neuropathy.  Recommend starting PT for balance, if she would like.  With labs being normal, there is nothing to medically treat to manage her neuropathy and there are no medications for numbness.

## 2017-09-05 ENCOUNTER — Ambulatory Visit: Payer: Medicare Other | Admitting: Physical Therapy

## 2017-09-06 NOTE — Progress Notes (Addendum)
PCP:  Cardiologist:  EKG: 03/08/2017 in EPIC  Stress test: 09/14/16 in EPIC  ECHO:  Cardiac Cath:  Chest x-ray

## 2017-09-06 NOTE — Pre-Procedure Instructions (Signed)
Jessica [name]  09/06/2017      [id number] [address], [place] [address] Phone: [phone] Fax: [phone]    Your procedure is scheduled on Sep 16, 2017.  Report to [facility] Admitting at [place] AM.  Call this number if you have problems the morning of surgery:  [phone]  Continue all medications as directed by your physician except follow these medication instructions before surgery below   Remember:  Do not eat food or drink liquids after midnight.  Take these medicines the morning of surgery with A SIP OF WATER  Acetylcysteine Eye drops-if needed Duloxetine (cymbalta) Levothyroxine (synthroid) Lithium carbonate (lithobid) Lorazepam (ativan) methocarbamol (roboxin) Myrbetriq  7 days prior to surgery STOP taking any tylenol arthritis, celecoxib (celebrex),  Aspirin (unless otherwise instructed by your surgeon), Aleve, Naproxen, Ibuprofen, Motrin, Advil, Goody's, BC's, all herbal medications, fish oil, and all vitamins   Do not wear jewelry, make-up or nail polish.  Do not wear lotions, powders, or perfumes, or deodorant.  Do not shave 48 hours prior to surgery.    Do not bring valuables to the hospital.  [facility] is not responsible for any belongings or valuables.  Contacts, dentures or bridgework may not be worn into surgery.  Leave your suitcase in the car.  After surgery it may be brought to your room.  For patients admitted to the hospital, discharge time will be determined by your treatment team.  Patients discharged the day of surgery will not be allowed to drive home.   [facility]- Preparing For Surgery  Before surgery, you can play an important role. Because skin is not sterile, your skin needs to be as free of germs as possible. You can reduce the number of germs on your skin by washing with CHG (chlorahexidine gluconate) Soap before surgery.  CHG is an antiseptic cleaner which  kills germs and bonds with the skin to continue killing germs even after washing.  Please do not use if you have an allergy to CHG or antibacterial soaps. If your skin becomes reddened/irritated stop using the CHG.  Do not shave (including legs and underarms) for at least 48 hours prior to first CHG shower. It is OK to shave your face.  Please follow these instructions carefully.   1. Shower the NIGHT BEFORE SURGERY and the MORNING OF SURGERY with CHG.   2. If you chose to wash your hair, wash your hair first as usual with your normal shampoo.  3. After you shampoo, rinse your hair and body thoroughly to remove the shampoo.  4. Use CHG as you would any other liquid soap. You can apply CHG directly to the skin and wash gently with a scrungie or a clean washcloth.   5. Apply the CHG Soap to your body ONLY FROM THE NECK DOWN.  Do not use on open wounds or open sores. Avoid contact with your eyes, ears, mouth and genitals (private parts). Wash Face and genitals (private parts)  with your normal soap.  6. Wash thoroughly, paying special attention to the area where your surgery will be performed.  7. Thoroughly rinse your body with warm water from the neck down.  8. DO NOT shower/wash with your normal soap after using and rinsing off the CHG Soap.  9. Pat yourself dry with a CLEAN TOWEL.  10. Wear CLEAN PAJAMAS to bed the night before surgery, wear comfortable clothes the morning of surgery  11.  Place CLEAN SHEETS on your bed the night of your first shower and DO NOT SLEEP WITH PETS.  Day of Surgery: Do not apply any deodorants/lotions. Please wear clean clothes to the hospital/surgery center.     Please read over the following fact sheets that you were given. Pain Booklet, Coughing and Deep Breathing, MRSA Information and Surgical Site Infection Prevention

## 2017-09-07 ENCOUNTER — Inpatient Hospital Stay (HOSPITAL_COMMUNITY)
Admission: RE | Admit: 2017-09-07 | Discharge: 2017-09-07 | Disposition: A | Discharge status: Home / Self Care | Payer: Medicare Other | Source: Ambulatory Visit

## 2017-09-08 ENCOUNTER — Ambulatory Visit: Payer: Medicare Other | Admitting: Physical Therapy

## 2017-09-09 ENCOUNTER — Other Ambulatory Visit: Payer: Self-pay

## 2017-09-09 ENCOUNTER — Encounter (HOSPITAL_COMMUNITY)
Admission: RE | Admit: 2017-09-09 | Discharge: 2017-09-09 | Disposition: A | Discharge status: Home / Self Care | Payer: Medicare Other | Source: Ambulatory Visit | Attending: Orthopedic Surgery | Admitting: Orthopedic Surgery

## 2017-09-09 ENCOUNTER — Encounter (HOSPITAL_COMMUNITY): Payer: Self-pay

## 2017-09-09 DIAGNOSIS — Z01818 Encounter for other preprocedural examination: Secondary | ICD-10-CM | POA: Insufficient documentation

## 2017-09-09 DIAGNOSIS — G5602 Carpal tunnel syndrome, left upper limb: Secondary | ICD-10-CM | POA: Diagnosis not present

## 2017-09-09 HISTORY — DX: Other seasonal allergic rhinitis: J30.2

## 2017-09-09 HISTORY — DX: Spinal stenosis, lumbar region without neurogenic claudication: M48.061

## 2017-09-09 HISTORY — DX: Systemic lupus erythematosus, unspecified: M32.9

## 2017-09-09 HISTORY — DX: Plantar fascial fibromatosis: M72.2

## 2017-09-09 LAB — CBC
HCT: 39.6 % (ref 36.0–46.0)
Hemoglobin: 12.4 g/dL (ref 12.0–15.0)
MCH: 30.6 pg (ref 26.0–34.0)
MCHC: 31.3 g/dL (ref 30.0–36.0)
MCV: 97.8 fL (ref 78.0–100.0)
Platelets: 402 10*3/uL — ABNORMAL HIGH (ref 150–400)
RBC: 4.05 MIL/uL (ref 3.87–5.11)
RDW: 15.5 % (ref 11.5–15.5)
WBC: 13.5 10*3/uL — ABNORMAL HIGH (ref 4.0–10.5)

## 2017-09-09 LAB — BASIC METABOLIC PANEL
Anion gap: 7 (ref 5–15)
BUN: 24 mg/dL — ABNORMAL HIGH (ref 6–20)
CO2: 24 mmol/L (ref 22–32)
Calcium: 10.5 mg/dL — ABNORMAL HIGH (ref 8.9–10.3)
Chloride: 108 mmol/L (ref 101–111)
Creatinine, Ser: 0.84 mg/dL (ref 0.44–1.00)
GFR calc Af Amer: >60 mL/min (ref >60)
GFR calc non Af Amer: >60 mL/min (ref >60)
Glucose, Bld: 101 mg/dL — ABNORMAL HIGH (ref 65–99)
Potassium: 4.4 mmol/L (ref 3.5–5.1)
Sodium: 139 mmol/L (ref 135–145)

## 2017-09-09 NOTE — Pre-Procedure Instructions (Signed)
Jessica [name]  09/09/2017      [id number] [address], [place] [address] Phone: [phone] Fax: [phone]    Your procedure is scheduled on Sep 16, 2017.  Report to [facility] Admitting at 11:30 AM.  Call this number if you have problems the morning of surgery:  [phone]  Continue all medications as directed by your physician except follow these medication instructions before surgery below   Remember:   Do not eat food or drink liquids after midnight.   Take these medicines the morning of surgery with A SIP OF WATER   Eye drops-if needed Duloxetine (cymbalta) Synthroid Lithium carbonate (lithobid) Methocarbamol (roboxin) Myrbetriq  7 days prior to surgery STOP taking any tylenol arthritis, celecoxib (celebrex),  Aspirin, Aleve, Naproxen, Ibuprofen, Motrin, Advil, Goody's, BC's, all herbal medications, fish oil, and all vitamins.   Do not wear jewelry, make-up or nail polish.  Do not wear lotions, powders, or perfumes, or deodorant.  Do not shave 48 hours prior to surgery.    Do not bring valuables to the hospital.  [facility] is not responsible for any belongings or valuables.  Contacts, dentures or bridgework may not be worn into surgery.  Leave your suitcase in the car.  After surgery it may be brought to your room.  For patients admitted to the hospital, discharge time will be determined by your treatment team.  Patients discharged the day of surgery will not be allowed to drive home.   - Preparing For Surgery  Before surgery, you can play an important role. Because skin is not sterile, your skin needs to be as free of germs as possible. You can reduce the number of germs on your skin by washing with CHG (chlorahexidine gluconate) Soap before surgery.  CHG is an antiseptic cleaner which kills germs and bonds with the skin to continue killing germs even after  washing.  Please do not use if you have an allergy to CHG or antibacterial soaps. If your skin becomes reddened/irritated stop using the CHG.  Do not shave (including legs and underarms) for at least 48 hours prior to first CHG shower. It is OK to shave your face.  Please follow these instructions carefully.   1. Shower the NIGHT BEFORE SURGERY and the MORNING OF SURGERY with CHG.   2. If you chose to wash your hair, wash your hair first as usual with your normal shampoo.  3. After you shampoo, rinse your hair and body thoroughly to remove the shampoo.  4. Use CHG as you would any other liquid soap. You can apply CHG directly to the skin and wash gently with a scrungie or a clean washcloth.   5. Apply the CHG Soap to your body ONLY FROM THE NECK DOWN.  Do not use on open wounds or open sores. Avoid contact with your eyes, ears, mouth and genitals (private parts). Wash Face and genitals (private parts)  with your normal soap.  6. Wash thoroughly, paying special attention to the area where your surgery will be performed.  7. Thoroughly rinse your body with warm water from the neck down.  8. DO NOT shower/wash with your normal soap after using and rinsing off the CHG Soap.  9. Pat yourself dry with a CLEAN TOWEL.  10. Wear CLEAN PAJAMAS to bed the night before surgery, wear comfortable clothes the morning of surgery  11. Place CLEAN SHEETS on your bed the  night of your first shower and DO NOT SLEEP WITH PETS.  Day of Surgery: Do not apply any deodorants/lotions. Please wear clean clothes to the hospital/surgery center.     Please read over the following fact sheets that you were given.

## 2017-09-09 NOTE — Progress Notes (Signed)
PCP: Dr. Donavan Burnet Cardiologist: denies   EKG: 03/2017 CXR: denies ECHO: denies Stress Test: 08/2016 Cardiac Cath: denies Sleep Study: Care Everywhere 02/2016, wears CPAP, nasal mask  Patient denies shortness of breath, fever, cough, and chest pain at PAT appointment.  Patient verbalized understanding of instructions provided today at the PAT appointment.  Patient asked to review instructions at home and day of surgery.

## 2017-09-12 ENCOUNTER — Ambulatory Visit: Payer: Medicare Other | Admitting: Neurology

## 2017-09-12 ENCOUNTER — Encounter

## 2017-09-12 NOTE — Progress Notes (Signed)
Anesthesia Chart Review:   Case:  [id number] Date/Time:  09/16/17 1315   Procedure:  LEFT CARPAL TUNNEL RELEASE (Left )   Anesthesia type:  Regional   Pre-op diagnosis:  LEFT CARPAL TUNNEL SYNDROME   Location:  [facility] OR ROOM 04 / [facility]   Surgeon:  Daryll Brod, MD      DISCUSSION: jPt is a 67 year old female.    VS: BP (!) 143/84   Pulse 91   Temp 36.8 C   Resp 20   Ht 5\' 2"  (1.575 m)   Wt 295 lb 6.7 oz (134 kg)   SpO2 97%   BMI 54.03 kg/m   PROVIDERS: PCP is Alroy Dust, L.Marlou Sa, MD   LABS: Labs reviewed: Acceptable for surgery.   (all labs ordered are listed, but only abnormal results are displayed)  Labs Reviewed  BASIC METABOLIC PANEL - Abnormal; Notable for the following components:      Result Value   Glucose, Bld 101 (*)    BUN 24 (*)    Calcium 10.5 (*)    All other components within normal limits  CBC - Abnormal; Notable for the following components:   WBC 13.5 (*)    Platelets 402 (*)    All other components within normal limits     EKG 03/08/17: NSR. Possible Inferior infarct, age undetermined. No significant change since prior EKG 01/22/13   CV:  Nuclear stress test 09/13/16:   Nuclear stress EF: 75%.  No T wave inversion was noted during stress.  There was no ST segment deviation noted during stress.  This is a low risk study. - No ischemia. LVEF 75% with normal wall motion. This is a low risk study.   Past Medical History:  Diagnosis Date  . Arthritis    lower back, hands, knees  . Bipolar disorder ([place])   . Depression   . Dyspnea    with exertion  . GERD (gastroesophageal reflux disease)    patient unsure about this dx - no meds  . Headache   . History of IBS    watches diet  . Hyperlipidemia   . Hypertension   . Hypothyroidism   . Obese   . Plantar fasciitis   . Seasonal allergies   . Sleep apnea 2017   uses CPAP   . Spinal stenosis of lumbar region   . SVD (spontaneous vaginal delivery)    x 2  . Systemic lupus ([place])   .  Thyroid disease     Past Surgical History:  Procedure Laterality Date  . carpel tunnel surgery Right   . CATARACT EXTRACTION Bilateral 2007   w/ lens implants  . COLONOSCOPY    . DILATION AND CURETTAGE OF UTERUS    . EYE SURGERY    . FOOT SURGERY Right    hammer toe  . FRACTURE SURGERY     R tibia and fibula  . LEG SURGERY Right   . TONSILLECTOMY    . WISDOM TOOTH EXTRACTION      MEDICATIONS: . nystatin cream (MYCOSTATIN)  . acetaminophen (TYLENOL ARTHRITIS PAIN) 650 MG CR tablet  . Acetylcysteine (NAC) 600 MG CAPS  . amLODipine (NORVASC) 5 MG tablet  . Carboxymethylcellulose Sod PF (RETAINE CMC) 0.5 % SOLN  . celecoxib (CELEBREX) 200 MG capsule  . Cholecalciferol (VITAMIN D) 2000 units CAPS  . citalopram (CELEXA) 20 MG tablet  . cycloSPORINE (RESTASIS) 0.05 % ophthalmic emulsion  . DULoxetine (CYMBALTA) 60 MG capsule  . furosemide (LASIX) 20 MG  tablet  . levothyroxine (SYNTHROID) 25 MCG tablet  . levothyroxine (SYNTHROID, LEVOTHROID) 150 MCG tablet  . lisdexamfetamine (VYVANSE) 60 MG capsule  . lithium carbonate (LITHOBID) 300 MG CR tablet  . loratadine (CLARITIN) 10 MG tablet  . LORazepam (ATIVAN) 1 MG tablet  . methocarbamol (ROBAXIN) 750 MG tablet  . mirabegron ER (MYRBETRIQ) 50 MG TB24 tablet  . Multiple Vitamins-Minerals (MULTIVITAMIN WITH MINERALS) tablet  . Multiple Vitamins-Minerals (PRESERVISION AREDS 2) CAPS  . pravastatin (PRAVACHOL) 20 MG tablet  . SYNTHROID 200 MCG tablet   No current facility-administered medications for this encounter.     If no changes, I anticipate pt can proceed with surgery as scheduled.   Willeen Cass, FNP-BC [facility]/Anesthesiology Phone: [phone] 09/12/2017 12:03 PM

## 2017-09-13 ENCOUNTER — Encounter: Payer: Self-pay | Admitting: Physical Therapy

## 2017-09-13 ENCOUNTER — Ambulatory Visit: Payer: Medicare Other | Attending: Family Medicine | Admitting: Physical Therapy

## 2017-09-13 DIAGNOSIS — M545 Low back pain, unspecified: Secondary | ICD-10-CM

## 2017-09-13 DIAGNOSIS — M5416 Radiculopathy, lumbar region: Secondary | ICD-10-CM | POA: Insufficient documentation

## 2017-09-13 DIAGNOSIS — G8929 Other chronic pain: Secondary | ICD-10-CM | POA: Insufficient documentation

## 2017-09-13 DIAGNOSIS — M62830 Muscle spasm of back: Secondary | ICD-10-CM | POA: Insufficient documentation

## 2017-09-13 DIAGNOSIS — R262 Difficulty in walking, not elsewhere classified: Secondary | ICD-10-CM

## 2017-09-13 DIAGNOSIS — M6281 Muscle weakness (generalized): Secondary | ICD-10-CM | POA: Insufficient documentation

## 2017-09-13 NOTE — Therapy (Addendum)
[place], [place], [address] Phone: [phone]   Fax:  [phone]  Physical Therapy Treatment/Discharge   Patient Details  Name: Jessica [name] MRN: [id number] Date of Birth: 05-20-1950 Referring Provider: Donavan Burnet MD   Encounter Date: 09/13/2017  PT End of Session - 09/13/17 1432    Visit Number  6    Number of Visits  16    Date for PT Re-Evaluation  09/27/17    Authorization Type  Medicare    PT Start Time  0216    PT Stop Time  0255    PT Time Calculation (min)  39 min       Past Medical History:  Diagnosis Date  . Arthritis    lower back, hands, knees  . Bipolar disorder ([place])   . Depression   . Dyspnea    with exertion  . GERD (gastroesophageal reflux disease)    patient unsure about this dx - no meds  . Headache   . History of IBS    watches diet  . Hyperlipidemia   . Hypertension   . Hypothyroidism   . Obese   . Plantar fasciitis   . Seasonal allergies   . Sleep apnea 2017   uses CPAP   . Spinal stenosis of lumbar region   . SVD (spontaneous vaginal delivery)    x 2  . Systemic lupus ([place])   . Thyroid disease     Past Surgical History:  Procedure Laterality Date  . carpel tunnel surgery Right   . CATARACT EXTRACTION Bilateral 2007   w/ lens implants  . COLONOSCOPY    . DILATION AND CURETTAGE OF UTERUS    . EYE SURGERY    . FOOT SURGERY Right    hammer toe  . FRACTURE SURGERY     R tibia and fibula  . LEG SURGERY Right   . TONSILLECTOMY    . WISDOM TOOTH EXTRACTION      There were no vitals filed for this visit.  Subjective Assessment - 09/13/17 1434    Subjective  2/10 back pain however my knees and joints are flared up due to being unable to take arthritis meds in preparation for surgery on Friday.     How long can you stand comfortably?  < 5 minutes    How long can you walk comfortably?  5 minutes, maybe a little longer     Currently in Pain?   Yes    Pain Score  2     Pain Location  Back    Pain Orientation  Lower    Pain Descriptors / Indicators  Aching    Aggravating Factors   standing and walking     Pain Relieving Factors  resting, change positions          [facility] PT Assessment - 09/13/17 0001      Observation/Other Assessments   Focus on Therapeutic Outcomes (FOTO)   FOTO 36% status - no change                   [facility] Adult PT Treatment/Exercise - 09/13/17 0001      Ambulation/Gait   Gait velocity  2.08 ft/sec 88f/36 sec      Self-Care   Other Self-Care Comments   Reviewed and practiced proper technique to rise from bed on the right side for post-carpal tunnel surgery; pushing off of right elbow   reviewed left side as well  Lumbar Exercises: Aerobic   Nustep  L3 LE x 7 minutes      Lumbar Exercises: Standing   Other Standing Lumbar Exercises  3 way hip 10 x each bilateral       Lumbar Exercises: Seated   Sit to Stand  10 reps without UE from standard chair     Other Seated Lumbar Exercises  physioball press for abdominal recruitment -ball in chair in front of patient- 5 sec x 10 cues for breathing; ball roll out 2x10  increased uncomfortable sensation in the left hand                PT Short Term Goals - 09/13/17 1510      PT SHORT TERM GOAL #1   Title  Patient will demonstrate proper  technique for transfers to decrease stress on back in order to transition from sitting to lying down and sitting to standing without overuse of UE's     Baseline  able to return demonstrate sit<->sidelying without over use of UE in prep for carpal tunnel surgery. Can sit-stand without UEs     Time  5    Period  Weeks    Status  Achieved      PT SHORT TERM GOAL #2   Title  Patient will be independent with initial HEP and strengthening program     Baseline  patient understands HEP thus far, needs more consistency     Time  4    Period  Weeks    Status  Partially Met      PT SHORT TERM GOAL #3    Title  Pt will state 3 pain management strategies or proper posture /body mechanics for household chores    Baseline  Therapy continues to review pain management strategies     Time  4    Period  Weeks    Status  On-going      PT SHORT TERM GOAL #4   Title  Pt will be able to stand and walk for 400 feet with =/< 4/10 pain    Baseline  164 ft prior to seated break due to pain on 08/29/2017, unable to assess 09/13/2017 due to pt's joint pain.     Time  4    Period  Weeks    Status  Unable to assess      PT SHORT TERM GOAL #5   Title  Pt will tolerate functional testing for long term goal assessment (sit to stand, 2 Min walk test)     Baseline  can perform 10 consecutive sit-stands without UE support - untimed    Time  4    Period  Weeks    Status  Partially Met        PT Long Term Goals - 09/13/17 1518      PT LONG TERM GOAL #1   Title  Patient will ambulate 3000' without increased pain in order to go shopping and to walk for recreation     Baseline  Pt currently not in routine walking program    Time  10    Period  Weeks    Status  On-going      PT LONG TERM GOAL #2   Title  Patient will stand for 20  min without increased pain in order to perform ADL's     Baseline  8-10 minutes    Time  10    Period  Weeks    Status  On-going  PT LONG TERM GOAL #3   Title  Patient wil be independent with a strengthening program to improve overall health     Time  10    Period  Weeks    Status  Deferred      PT LONG TERM GOAL #4   Title  Pt will walk with at least 2.0 ft/ sec to be limited community ambulator    Baseline  Pt now at household ambulator level < 1.31 ft/sec at eval, 2.08 ft/sec on 09/13/2017    Time  --    Period  Weeks    Status  Achieved      PT LONG TERM GOAL #5   Title  "FOTO will improve from 64 % limitation   to   52% limitation  indicating improved functional mobility     Baseline  64% limitation Eval, 64% limitation status 09/13/2017    Time  10     Period  Weeks    Status  On-going      PT LONG TERM GOAL #6   Title  "Pt will improve lower extremity  strength throughout  to >/=  4- /5 with </= 4/10 pain to promote safety with walking/standing activities    Baseline  Pt grossly 2+/5 to 4-/5 for lower extremity     Time  10    Period  Weeks    Status  Unable to assess            Plan - 09/13/17 1515    Clinical Impression Statement  Mrs. Condron will have capal tunnel release on Friday May 17th. Therapy focused review of supine to sit and sit to stands transfers with minimal use of UE as she be unable to weight bear through LUE and is also having pain in her right hand. She is independent with these transfers. STG#1 met. Therapy requested MD to order Platform walker for Mrs. Grupp and she has been in contact with MD about this. Her gait speed has improved to 2.95f/sec with her rollator. LTG# 4 met.     PT Next Visit Plan  give basic stretches hip flex, hamstring stretch in sitting.  if pt cannot lie down,  Try to perform IR/ER stretch in sittting. heel to knee as able to provide some stretch.  Pt will be travelling for a month,add Basic stetches    PT Home Exercise Plan  ERO after surgery posture sitting and standing. how to use rollator, sit-stand, standing way hip, seated clam red band     Consulted and Agree with Plan of Care  Patient       Patient will benefit from skilled therapeutic intervention in order to improve the following deficits and impairments:  Difficulty walking, Decreased range of motion, Decreased activity tolerance, Decreased mobility, Decreased knowledge of use of DME, Pain, Obesity, Impaired UE functional use, Improper body mechanics, Postural dysfunction, Impaired sensation, Impaired flexibility, Increased muscle spasms, Increased fascial restricitons, Hypomobility, Decreased strength  Visit Diagnosis: Chronic bilateral low back pain without sciatica  Muscle spasm of back  Difficulty in walking, not  elsewhere classified  Muscle weakness (generalized)  Radiculopathy, lumbar region    PHYSICAL THERAPY DISCHARGE SUMMARY  Visits from Start of Care: 6  Current functional level related to goals / functional outcomes: Continued low back pain    Remaining deficits: Continued Low back pain    Education / Equipment: HEP  Plan: Patient agrees to discharge.  Patient goals were not met. Patient is being discharged due to  not returning since the last visit.  ?????      Problem List Patient Active Problem List   Diagnosis Date Noted  . Primary osteoarthritis of both hands 04/19/2017  . Primary osteoarthritis of both knees 04/19/2017  . Primary osteoarthritis of both feet 04/19/2017  . DDD (degenerative disc disease), lumbar 04/19/2017  . History of bipolar disorder 04/19/2017  . Hypothyroidism 06/04/2014  . Chest pain 01/23/2013  . ARF (acute renal failure) ([place]) 01/23/2013  . HTN (hypertension) 01/23/2013  . Leukocytosis 01/23/2013  . HLD (hyperlipidemia) 01/23/2013    Dorene Ar , PTA 09/13/2017, 3:24 PM  Carolyne Littles PT DPT  11/01/2017   [facility] [address] [place], [place], [address] Phone: [phone]   Fax:  [phone]  Name: NGOZI ALVIDREZ MRN: [id number] Date of Birth: 19-Dec-1950

## 2017-09-14 ENCOUNTER — Ambulatory Visit: Payer: Medicare Other | Admitting: Physical Therapy

## 2017-09-15 MED ORDER — VANCOMYCIN HCL 10 G IV SOLR
1500.0000 mg | INTRAVENOUS | Status: AC
  Administered 2017-09-16: 1500 mg via INTRAVENOUS
  Filled 2017-09-15: qty 1500

## 2017-09-16 ENCOUNTER — Ambulatory Visit (HOSPITAL_COMMUNITY): Payer: Medicare Other | Admitting: Certified Registered Nurse Anesthetist

## 2017-09-16 ENCOUNTER — Encounter (HOSPITAL_COMMUNITY)
Admission: RE | Disposition: A | Discharge status: Home / Self Care | Payer: Self-pay | Source: Ambulatory Visit | Attending: Orthopedic Surgery

## 2017-09-16 ENCOUNTER — Ambulatory Visit (HOSPITAL_COMMUNITY): Payer: Medicare Other | Admitting: Emergency Medicine

## 2017-09-16 ENCOUNTER — Ambulatory Visit (HOSPITAL_COMMUNITY)
Admission: RE | Admit: 2017-09-16 | Discharge: 2017-09-16 | Disposition: A | Discharge status: Home / Self Care | Payer: Medicare Other | Source: Ambulatory Visit | Attending: Orthopedic Surgery | Admitting: Orthopedic Surgery

## 2017-09-16 ENCOUNTER — Encounter (HOSPITAL_COMMUNITY): Payer: Self-pay | Admitting: *Deleted

## 2017-09-16 DIAGNOSIS — Z9989 Dependence on other enabling machines and devices: Secondary | ICD-10-CM | POA: Insufficient documentation

## 2017-09-16 DIAGNOSIS — E039 Hypothyroidism, unspecified: Secondary | ICD-10-CM | POA: Diagnosis not present

## 2017-09-16 DIAGNOSIS — I10 Essential (primary) hypertension: Secondary | ICD-10-CM | POA: Diagnosis not present

## 2017-09-16 DIAGNOSIS — G4730 Sleep apnea, unspecified: Secondary | ICD-10-CM | POA: Diagnosis not present

## 2017-09-16 DIAGNOSIS — E785 Hyperlipidemia, unspecified: Secondary | ICD-10-CM | POA: Diagnosis not present

## 2017-09-16 DIAGNOSIS — G5602 Carpal tunnel syndrome, left upper limb: Secondary | ICD-10-CM | POA: Diagnosis not present

## 2017-09-16 DIAGNOSIS — M180 Bilateral primary osteoarthritis of first carpometacarpal joints: Secondary | ICD-10-CM | POA: Insufficient documentation

## 2017-09-16 DIAGNOSIS — Z6841 Body Mass Index (BMI) 40.0 and over, adult: Secondary | ICD-10-CM | POA: Insufficient documentation

## 2017-09-16 DIAGNOSIS — R200 Anesthesia of skin: Secondary | ICD-10-CM | POA: Diagnosis present

## 2017-09-16 HISTORY — PX: CARPAL TUNNEL RELEASE: SHX101

## 2017-09-16 SURGERY — CARPAL TUNNEL RELEASE
Anesthesia: General | Site: Hand | Laterality: Left

## 2017-09-16 MED ORDER — LIDOCAINE HCL (CARDIAC) PF 100 MG/5ML IV SOSY
PREFILLED_SYRINGE | INTRAVENOUS | Status: DC | PRN
  Administered 2017-09-16: 100 mg via INTRATRACHEAL

## 2017-09-16 MED ORDER — ONDANSETRON HCL 4 MG/2ML IJ SOLN
INTRAMUSCULAR | Status: DC | PRN
  Administered 2017-09-16: 4 mg via INTRAVENOUS

## 2017-09-16 MED ORDER — HYDROCODONE-ACETAMINOPHEN 5-325 MG PO TABS: 1.0000 | ORAL_TABLET | Freq: Four times a day (QID) | ORAL | 0 refills | Status: DC | PRN

## 2017-09-16 MED ORDER — BUPIVACAINE HCL 0.25 % IJ SOLN
INTRAMUSCULAR | Status: DC | PRN
  Administered 2017-09-16: 10 mL

## 2017-09-16 MED ORDER — BUPIVACAINE HCL (PF) 0.25 % IJ SOLN
INTRAMUSCULAR | Status: AC
  Filled 2017-09-16: qty 30

## 2017-09-16 MED ORDER — FENTANYL CITRATE (PF) 250 MCG/5ML IJ SOLN
INTRAMUSCULAR | Status: DC | PRN
  Administered 2017-09-16: 50 ug via INTRAVENOUS
  Administered 2017-09-16: 25 ug via INTRAVENOUS

## 2017-09-16 MED ORDER — ONDANSETRON HCL 4 MG/2ML IJ SOLN
INTRAMUSCULAR | Status: AC
  Filled 2017-09-16: qty 2

## 2017-09-16 MED ORDER — CHLORHEXIDINE GLUCONATE 4 % EX LIQD: 60.0000 mL | Freq: Once | CUTANEOUS | Status: DC

## 2017-09-16 MED ORDER — HYDROMORPHONE HCL 2 MG/ML IJ SOLN: 0.2500 mg | INTRAMUSCULAR | Status: DC | PRN

## 2017-09-16 MED ORDER — PROPOFOL 10 MG/ML IV BOLUS
INTRAVENOUS | Status: DC | PRN
  Administered 2017-09-16: 160 mg via INTRAVENOUS

## 2017-09-16 MED ORDER — PROPOFOL 10 MG/ML IV BOLUS
INTRAVENOUS | Status: AC
  Filled 2017-09-16: qty 40

## 2017-09-16 MED ORDER — OXYCODONE HCL 5 MG/5ML PO SOLN: 5.0000 mg | Freq: Once | ORAL | Status: AC | PRN

## 2017-09-16 MED ORDER — OXYCODONE HCL 5 MG PO TABS
5.0000 mg | ORAL_TABLET | Freq: Once | ORAL | Status: AC | PRN
  Administered 2017-09-16: 5 mg via ORAL

## 2017-09-16 MED ORDER — KETAMINE HCL 10 MG/ML IJ SOLN
INTRAMUSCULAR | Status: AC
  Filled 2017-09-16: qty 1

## 2017-09-16 MED ORDER — MEPERIDINE HCL 50 MG/ML IJ SOLN: 6.2500 mg | INTRAMUSCULAR | Status: DC | PRN

## 2017-09-16 MED ORDER — SODIUM CHLORIDE 0.9 % IV SOLN: INTRAVENOUS | Status: DC

## 2017-09-16 MED ORDER — FENTANYL CITRATE (PF) 250 MCG/5ML IJ SOLN
INTRAMUSCULAR | Status: AC
  Filled 2017-09-16: qty 5

## 2017-09-16 MED ORDER — ARTIFICIAL TEARS OPHTHALMIC OINT
TOPICAL_OINTMENT | OPHTHALMIC | Status: AC
  Filled 2017-09-16: qty 3.5

## 2017-09-16 MED ORDER — LACTATED RINGERS IV SOLN
INTRAVENOUS | Status: DC
  Administered 2017-09-16: 12:00:00 via INTRAVENOUS

## 2017-09-16 MED ORDER — OXYCODONE HCL 5 MG PO TABS
ORAL_TABLET | ORAL | Status: AC
  Filled 2017-09-16: qty 1

## 2017-09-16 MED ORDER — DEXAMETHASONE SODIUM PHOSPHATE 10 MG/ML IJ SOLN
INTRAMUSCULAR | Status: AC
  Filled 2017-09-16: qty 1

## 2017-09-16 MED ORDER — GLYCOPYRROLATE 0.2 MG/ML IJ SOLN
INTRAMUSCULAR | Status: DC | PRN
  Administered 2017-09-16: 0.2 mg via INTRAVENOUS

## 2017-09-16 MED ORDER — LIDOCAINE 2% (20 MG/ML) 5 ML SYRINGE
INTRAMUSCULAR | Status: AC
  Filled 2017-09-16: qty 5

## 2017-09-16 MED ORDER — 0.9 % SODIUM CHLORIDE (POUR BTL) OPTIME
TOPICAL | Status: DC | PRN
  Administered 2017-09-16: 1000 mL

## 2017-09-16 MED ORDER — MIDAZOLAM HCL 2 MG/2ML IJ SOLN
INTRAMUSCULAR | Status: AC
  Filled 2017-09-16: qty 2

## 2017-09-16 SURGICAL SUPPLY — 32 items
BNDG CMPR 9X4 STRL LF SNTH (GAUZE/BANDAGES/DRESSINGS) ×1
BNDG COHESIVE 3X5 TAN STRL LF (GAUZE/BANDAGES/DRESSINGS) ×2 IMPLANT
BNDG ESMARK 4X9 LF (GAUZE/BANDAGES/DRESSINGS) ×2 IMPLANT
BNDG GAUZE ELAST 4 BULKY (GAUZE/BANDAGES/DRESSINGS) ×2 IMPLANT
CHLORAPREP W/TINT 26ML (MISCELLANEOUS) ×2 IMPLANT
CORDS BIPOLAR (ELECTRODE) ×2 IMPLANT
COVER SURGICAL LIGHT HANDLE (MISCELLANEOUS) ×1 IMPLANT
CUFF TOURNIQUET SINGLE 24IN (TOURNIQUET CUFF) ×1 IMPLANT
DECANTER SPIKE VIAL GLASS SM (MISCELLANEOUS) ×1 IMPLANT
DRSG PAD ABDOMINAL 8X10 ST (GAUZE/BANDAGES/DRESSINGS) ×1 IMPLANT
GAUZE SPONGE 4X4 12PLY STRL (GAUZE/BANDAGES/DRESSINGS) ×1 IMPLANT
GAUZE XEROFORM 1X8 LF (GAUZE/BANDAGES/DRESSINGS) ×2 IMPLANT
GLOVE BIOGEL PI IND STRL 8 (GLOVE) ×1 IMPLANT
GLOVE BIOGEL PI INDICATOR 8 (GLOVE) ×1
GLOVE SURG ORTHO 8.0 STRL STRW (GLOVE) ×2 IMPLANT
GOWN STRL REUS W/ TWL LRG LVL3 (GOWN DISPOSABLE) ×1 IMPLANT
GOWN STRL REUS W/ TWL XL LVL3 (GOWN DISPOSABLE) ×1 IMPLANT
GOWN STRL REUS W/TWL LRG LVL3 (GOWN DISPOSABLE) ×2
GOWN STRL REUS W/TWL XL LVL3 (GOWN DISPOSABLE) ×2
KIT BASIN OR (CUSTOM PROCEDURE TRAY) ×2 IMPLANT
KIT TURNOVER KIT B (KITS) ×2 IMPLANT
NDL HYPO 25GX1X1/2 BEV (NEEDLE) IMPLANT
NEEDLE HYPO 25GX1X1/2 BEV (NEEDLE) IMPLANT
NS IRRIG 1000ML POUR BTL (IV SOLUTION) ×2 IMPLANT
PACK ORTHO EXTREMITY (CUSTOM PROCEDURE TRAY) ×2 IMPLANT
PAD ARMBOARD 7.5X6 YLW CONV (MISCELLANEOUS) ×3 IMPLANT
PAD CAST 4YDX4 CTTN HI CHSV (CAST SUPPLIES) IMPLANT
PADDING CAST COTTON 4X4 STRL (CAST SUPPLIES)
SUT ETHILON 4 0 PS 2 18 (SUTURE) ×2 IMPLANT
SYR CONTROL 10ML LL (SYRINGE) ×1 IMPLANT
TOWEL OR 17X26 10 PK STRL BLUE (TOWEL DISPOSABLE) ×2 IMPLANT
UNDERPAD 30X30 (UNDERPADS AND DIAPERS) ×3 IMPLANT

## 2017-09-16 NOTE — Brief Op Note (Signed)
09/16/2017  4:17 PM  PATIENT:  Jessica [name]  67 y.o. female  PRE-OPERATIVE DIAGNOSIS:  LEFT CARPAL TUNNEL SYNDROME  POST-OPERATIVE DIAGNOSIS:  LEFT CARPAL TUNNEL SYNDROME  PROCEDURE:  Procedure(s): LEFT CARPAL TUNNEL RELEASE (Left)  SURGEON:  Surgeon(s) and Role:    * Daryll Brod, MD - Primary  PHYSICIAN ASSISTANT:   ASSISTANTS: none   ANESTHESIA:   local and general  EBL:  31ml   BLOOD ADMINISTERED:none  DRAINS: none   LOCAL MEDICATIONS USED:  BUPIVICAINE   SPECIMEN:  No Specimen  DISPOSITION OF SPECIMEN:  N/A  COUNTS:  YES  TOURNIQUET:   Total Tourniquet Time Documented: Upper Arm (Left) - 10 minutes Total: Upper Arm (Left) - 10 minutes   DICTATION: .Viviann Spare Dictation  PLAN OF CARE: Discharge to home after PACU  PATIENT DISPOSITION:  PACU - hemodynamically stable.

## 2017-09-16 NOTE — H&P (Signed)
Jessica [name] is an 67 y.o. female.   Chief Complaint: numbness left handHPI:Jessica [name] is a 67 year old right-hand-dominant female comes in complaining of numbness and tingling a shock feeling in her left hand thumb to ring finger. She is referred by Dr. Donnie Coffin. This been going on since November or December of last year. She recalls no specific history of injury. She has not had a injury to her neck. She is not awakened at night. She has had a carpal tunnel release done in the past in [place]. This was many years ago. She states nothing seems to make it better or worse. She does use a walker and attributes some of her discomfort to using a walker. This for back pain. He has had nerve conductions done by Dr. Posey Pronto in January. These are available and reviewed. It shows a motor delay at 9.3 and no sensory response. She has a history of thyroid problems and arthritis no history of diabetes or gout. Family history is positive diabetes and arthritis negative for thyroid problems and gout. She has been tested for diabetes. Is not complaining of pain per se. There is more numbness and tingling in the electricity feeling.      Past Medical History:  Diagnosis Date  . Arthritis    lower back, hands, knees  . Bipolar disorder ([place])   . Depression   . Dyspnea    with exertion  . GERD (gastroesophageal reflux disease)    patient unsure about this dx - no meds  . Headache   . History of IBS    watches diet  . Hyperlipidemia   . Hypertension   . Hypothyroidism   . Obese   . Plantar fasciitis   . Seasonal allergies   . Sleep apnea 2017   uses CPAP   . Spinal stenosis of lumbar region   . SVD (spontaneous vaginal delivery)    x 2  . Systemic lupus ([place])   . Thyroid disease     Past Surgical History:  Procedure Laterality Date  . carpel tunnel surgery Right   . CATARACT EXTRACTION Bilateral 2007   w/ lens implants  . COLONOSCOPY    . DILATION AND CURETTAGE OF UTERUS    . EYE  SURGERY    . FOOT SURGERY Right    hammer toe  . FRACTURE SURGERY     R tibia and fibula  . LEG SURGERY Right   . TONSILLECTOMY    . WISDOM TOOTH EXTRACTION      Family History  Problem Relation Age of Onset  . Thyroid disease Mother   . Breast cancer Mother   . Heart disease Mother   . Bipolar disorder Father   . Diabetes Father   . Heart disease Father   . Dementia Father    Social History:  reports that she has never smoked. She has never used smokeless tobacco. She reports that she drinks alcohol. She reports that she does not use drugs.  Allergies:  Allergies  Allergen Reactions  . Ibuprofen Diarrhea, Nausea Only and Other (See Comments)    Extreme stomach pain  Makes her feel bad  . Codeine     UNSPECIFIED REACTION   . Nabumetone     UNSPECIFIED REACTION   . Promethazine     UNSPECIFIED REACTION   . Amoxicillin-Pot Clavulanate Nausea And Vomiting  . Erythromycin Base Diarrhea  . Promethazine-Codeine Other (See Comments)    "Makes her feel bad"    No medications prior  to admission.    No results found for this or any previous visit (from the past 48 hour(s)).  No results found.   Pertinent items are noted in HPI.  There were no vitals taken for this visit.  General appearance: alert, cooperative and appears stated age Head: Normocephalic, without obvious abnormality Neck: no JVD Resp: clear to auscultation bilaterally Cardio: regular rate and rhythm, S1, S2 normal, no murmur, click, rub or gallop GI: soft, non-tender; bowel sounds normal; no masses,  no organomegaly Extremities: numbness left hand Pulses: 2+ and symmetric Skin: Skin color, texture, turgor normal. No rashes or lesions Neurologic: Grossly normal Incision/Wound: na  Assessment/Plan Assessment:  1. Carpal tunnel syndrome of left wrist  2.  Primary osteoarthritis of both first carpometacarpal joints    Plan: Have discussed with her her nerve conductions. She would like to have  this surgically released. She does not want to try injection she does not want to try splinting pre-peri-postoperative course are discussed along with risks and complications. She is aware there is no guarantee to the surgery the possibility of infection recurrence injury to arteries nerves tendons complete relief symptoms dystrophy. Her [facility] notes and Dr. Serita Grit notes are reviewed. She is scheduled for a left carpal tunnel release in outpatient under regional anesthesia.      Ysidro Ramsay R 09/16/2017, 8:43 AM

## 2017-09-16 NOTE — Op Note (Signed)
Preoperative diagnosis carpal tunnel syndrome left hand  Postoperative diagnosis: Same  Operation: Carpal tunnel release left hand  Surgeon: Daryll Brod  Anesthesia: General  Surgery: Hospital  History the patient is a 67 year old female with a history of carpal tunnel syndrome nerve conductions positive.  This is not responded to conservative treatment.  She has elected undergo surgical release of the median nerve.  Pre-peri-and postoperative course been discussed along with risk complications.  She is aware there is no guarantee to the surgery the possibility of infection recurrence injury to arteries nerves tendons and complete relief of symptoms and dystrophy.  The preoperative area the patient is seen extremity marked by both patient and surgeon antibiotic given  Procedure the patient is brought to the operating room where a general anesthetic was carried out without difficulty under the direction the anesthesia department.  She was prepped using ChloraPrep in the supine position with left arm free.  A three-minute dry time was lot and a timeout taken to confirm patient procedure.  A longitudinal incision was made in the left palm.  This was carried down through subcutaneous tissue.  Bleeders were electrocauterized with bipolar.  The palmar fascia was split.  The superficial palmar arch was identified along with the flexor tendon of the ring little finger.  The flexor retinaculum was then released and its ulnar aspect after retractors were placed protecting the median nerve radially and the ulnar nerve ulnarly.  A right angle and stool retractor placed between skin and forearm fascia proximally.  Deep structures were dissected free with blunt dissection.  The proximal aspect of the flexor retinaculum distal forearm fascia was then released for approximately 2 to 3 cm proximal to the wrist crease under direct vision.  The canal was explored.  An area compression of the median nerve was apparent.   The motor branch entered in the muscle distally.  The wound was copiously irrigated with saline.  The skin was closed with interrupted 4 nylon sutures.  Local infiltration with quarter percent bupivacaine without epinephrine was given.  Approximately 10 cc was used.  A sterile compressive dressing with finger spray was applied.  Deflation of the tourniquet all fingers immediately pink.  She was taken to the recovery room for observation in satisfactory condition.  She will be discharged home to return to the [facility]  [place] in 1 week on [place].

## 2017-09-16 NOTE — Anesthesia Preprocedure Evaluation (Signed)
Anesthesia Evaluation  Patient identified by MRN, date of birth, ID band Patient awake    Reviewed: Allergy & Precautions, NPO status , Patient's Chart, lab work & pertinent test results  Airway Mallampati: II  TM Distance: >3 FB Neck ROM: Full    Dental no notable dental hx.    Pulmonary neg pulmonary ROS, sleep apnea ,    Pulmonary exam normal breath sounds clear to auscultation       Cardiovascular hypertension, Pt. on medications negative cardio ROS Normal cardiovascular exam Rhythm:Regular Rate:Normal     Neuro/Psych  Headaches, Depression Bipolar Disorder negative neurological ROS  negative psych ROS   GI/Hepatic negative GI ROS, Neg liver ROS, GERD  ,  Endo/Other  negative endocrine ROSHypothyroidism Morbid obesity  Renal/GU negative Renal ROS  negative genitourinary   Musculoskeletal negative musculoskeletal ROS (+) Arthritis ,   Abdominal   Peds negative pediatric ROS (+)  Hematology negative hematology ROS (+)   Anesthesia Other Findings   Reproductive/Obstetrics negative OB ROS                             Anesthesia Physical Anesthesia Plan  ASA: III  Anesthesia Plan: General   Post-op Pain Management:    Induction: Intravenous  PONV Risk Score and Plan: 3 and Ondansetron, Dexamethasone and Midazolam  Airway Management Planned: LMA  Additional Equipment:   Intra-op Plan:   Post-operative Plan: Extubation in OR  Informed Consent: I have reviewed the patients History and Physical, chart, labs and discussed the procedure including the risks, benefits and alternatives for the proposed anesthesia with the patient or authorized representative who has indicated his/her understanding and acceptance.   Dental advisory given  Plan Discussed with: CRNA  Anesthesia Plan Comments:         Anesthesia Quick Evaluation

## 2017-09-16 NOTE — Transfer of Care (Signed)
Immediate Anesthesia Transfer of Care Note  Patient: Jessica [name]  Procedure(s) Performed: LEFT CARPAL TUNNEL RELEASE (Left Hand)  Patient Location: PACU  Anesthesia Type:General  Level of Consciousness: awake, alert  and oriented  Airway & Oxygen Therapy: Patient Spontanous Breathing and Patient connected to nasal cannula oxygen  Post-op Assessment: Report given to RN, Post -op Vital signs reviewed and stable and Patient moving all extremities X 4  Post vital signs: Reviewed and stable  Last Vitals:  Vitals Value Taken Time  BP 151/98 09/16/2017  4:31 PM  Temp    Pulse 99 09/16/2017  4:31 PM  Resp 21 09/16/2017  4:31 PM  SpO2 92 % 09/16/2017  4:31 PM  Vitals shown include unvalidated device data.  Last Pain:  Vitals:   09/16/17 1211  TempSrc: Oral  PainSc:       Patients Stated Pain Goal: 3 (37/29/02 1115)  Complications: No apparent anesthesia complications

## 2017-09-19 ENCOUNTER — Encounter (HOSPITAL_COMMUNITY): Payer: Self-pay | Admitting: Orthopedic Surgery

## 2017-09-20 DIAGNOSIS — M79644 Pain in right finger(s): Secondary | ICD-10-CM | POA: Diagnosis not present

## 2017-09-20 DIAGNOSIS — R51 Headache: Secondary | ICD-10-CM | POA: Diagnosis not present

## 2017-09-20 DIAGNOSIS — G629 Polyneuropathy, unspecified: Secondary | ICD-10-CM | POA: Diagnosis not present

## 2017-09-20 DIAGNOSIS — B373 Candidiasis of vulva and vagina: Secondary | ICD-10-CM | POA: Diagnosis not present

## 2017-09-20 DIAGNOSIS — R32 Unspecified urinary incontinence: Secondary | ICD-10-CM | POA: Diagnosis not present

## 2017-09-20 NOTE — Anesthesia Postprocedure Evaluation (Signed)
Anesthesia Post Note  Patient: Jessica [name]  Procedure(s) Performed: LEFT CARPAL TUNNEL RELEASE (Left Hand)     Patient location during evaluation: PACU Anesthesia Type: General Level of consciousness: awake and alert Pain management: pain level controlled Vital Signs Assessment: post-procedure vital signs reviewed and stable Respiratory status: spontaneous breathing, nonlabored ventilation and respiratory function stable Cardiovascular status: blood pressure returned to baseline and stable Postop Assessment: no apparent nausea or vomiting Anesthetic complications: no    Last Vitals:  Vitals:   09/16/17 1700 09/16/17 1715  BP: (!) 148/76   Pulse: 93 85  Resp: 19 12  Temp:    SpO2: (!) 87% 96%    Last Pain:  Vitals:   09/16/17 1700  TempSrc:   PainSc: 5    Pain Goal: Patients Stated Pain Goal: 3 (09/16/17 1209)               Lynda Rainwater

## 2017-10-10 DIAGNOSIS — B356 Tinea cruris: Secondary | ICD-10-CM | POA: Diagnosis not present

## 2017-10-10 DIAGNOSIS — W57XXXA Bitten or stung by nonvenomous insect and other nonvenomous arthropods, initial encounter: Secondary | ICD-10-CM | POA: Diagnosis not present

## 2017-10-10 DIAGNOSIS — S30860A Insect bite (nonvenomous) of lower back and pelvis, initial encounter: Secondary | ICD-10-CM | POA: Diagnosis not present

## 2017-10-10 DIAGNOSIS — M25539 Pain in unspecified wrist: Secondary | ICD-10-CM | POA: Diagnosis not present

## 2017-10-17 ENCOUNTER — Other Ambulatory Visit: Payer: Self-pay | Admitting: Rheumatology

## 2017-10-17 NOTE — Telephone Encounter (Signed)
Last Visit: 04/19/17 Next Visit: 04/11/18 Labs: 09/09/17 stable  Okay to refill per Dr. Deveshwar  

## 2017-10-18 DIAGNOSIS — F319 Bipolar disorder, unspecified: Secondary | ICD-10-CM | POA: Diagnosis not present

## 2017-11-01 ENCOUNTER — Encounter: Payer: Self-pay | Admitting: Physical Therapy

## 2017-11-10 ENCOUNTER — Other Ambulatory Visit: Payer: Self-pay | Admitting: Rheumatology

## 2017-11-11 NOTE — Telephone Encounter (Signed)
Last Visit: 04/19/17 Next Visit: 04/11/18 Labs: 09/09/17 stable  Okay to refill per Dr. Estanislado Pandy

## 2017-11-17 DIAGNOSIS — R197 Diarrhea, unspecified: Secondary | ICD-10-CM | POA: Diagnosis not present

## 2017-11-17 DIAGNOSIS — R6884 Jaw pain: Secondary | ICD-10-CM | POA: Diagnosis not present

## 2017-11-18 DIAGNOSIS — R197 Diarrhea, unspecified: Secondary | ICD-10-CM | POA: Diagnosis not present

## 2017-11-21 DIAGNOSIS — M545 Low back pain: Secondary | ICD-10-CM | POA: Diagnosis not present

## 2017-12-14 DIAGNOSIS — Z961 Presence of intraocular lens: Secondary | ICD-10-CM | POA: Diagnosis not present

## 2017-12-14 DIAGNOSIS — H353131 Nonexudative age-related macular degeneration, bilateral, early dry stage: Secondary | ICD-10-CM | POA: Diagnosis not present

## 2017-12-14 DIAGNOSIS — H04123 Dry eye syndrome of bilateral lacrimal glands: Secondary | ICD-10-CM | POA: Diagnosis not present

## 2018-01-05 DIAGNOSIS — Z23 Encounter for immunization: Secondary | ICD-10-CM | POA: Diagnosis not present

## 2018-01-10 DIAGNOSIS — F319 Bipolar disorder, unspecified: Secondary | ICD-10-CM | POA: Diagnosis not present

## 2018-01-16 DIAGNOSIS — M545 Low back pain: Secondary | ICD-10-CM | POA: Diagnosis not present

## 2018-01-27 ENCOUNTER — Other Ambulatory Visit: Payer: Self-pay | Admitting: Psychiatry

## 2018-01-27 ENCOUNTER — Other Ambulatory Visit: Payer: Self-pay | Admitting: Rheumatology

## 2018-01-27 DIAGNOSIS — F3175 Bipolar disorder, in partial remission, most recent episode depressed: Secondary | ICD-10-CM | POA: Diagnosis not present

## 2018-01-28 LAB — LITHIUM LEVEL: Lithium Lvl: 1 mmol/L (ref 0.6–1.2)

## 2018-01-30 MED ORDER — METHOCARBAMOL 500 MG PO TABS: ORAL_TABLET | ORAL | 2 refills | Status: DC

## 2018-01-30 NOTE — Addendum Note (Signed)
Addended by: Carole Binning on: 01/30/2018 12:46 PM   Modules accepted: Orders

## 2018-01-30 NOTE — Telephone Encounter (Signed)
Last Visit: 04/19/17 Next Visit: 04/11/18  Okay to refill per Dr. Estanislado Pandy

## 2018-02-28 DIAGNOSIS — K529 Noninfective gastroenteritis and colitis, unspecified: Secondary | ICD-10-CM | POA: Diagnosis not present

## 2018-02-28 DIAGNOSIS — M545 Low back pain: Secondary | ICD-10-CM | POA: Diagnosis not present

## 2018-02-28 DIAGNOSIS — M79602 Pain in left arm: Secondary | ICD-10-CM | POA: Diagnosis not present

## 2018-02-28 DIAGNOSIS — M25562 Pain in left knee: Secondary | ICD-10-CM | POA: Diagnosis not present

## 2018-02-28 DIAGNOSIS — J309 Allergic rhinitis, unspecified: Secondary | ICD-10-CM | POA: Diagnosis not present

## 2018-03-09 ENCOUNTER — Ambulatory Visit: Payer: Self-pay | Admitting: Psychiatry

## 2018-03-09 ENCOUNTER — Encounter

## 2018-03-09 DIAGNOSIS — H353131 Nonexudative age-related macular degeneration, bilateral, early dry stage: Secondary | ICD-10-CM | POA: Diagnosis not present

## 2018-03-09 DIAGNOSIS — H04123 Dry eye syndrome of bilateral lacrimal glands: Secondary | ICD-10-CM | POA: Diagnosis not present

## 2018-03-09 DIAGNOSIS — Z961 Presence of intraocular lens: Secondary | ICD-10-CM | POA: Diagnosis not present

## 2018-03-14 DIAGNOSIS — M545 Low back pain: Secondary | ICD-10-CM | POA: Diagnosis not present

## 2018-03-14 DIAGNOSIS — I10 Essential (primary) hypertension: Secondary | ICD-10-CM | POA: Diagnosis not present

## 2018-03-14 DIAGNOSIS — Z6841 Body Mass Index (BMI) 40.0 and over, adult: Secondary | ICD-10-CM | POA: Diagnosis not present

## 2018-03-15 DIAGNOSIS — D23122 Other benign neoplasm of skin of left lower eyelid, including canthus: Secondary | ICD-10-CM | POA: Diagnosis not present

## 2018-03-15 DIAGNOSIS — H04123 Dry eye syndrome of bilateral lacrimal glands: Secondary | ICD-10-CM | POA: Diagnosis not present

## 2018-03-28 NOTE — Progress Notes (Deleted)
Office Visit Note  Patient: Jessica [name]             Date of Birth: 1950-11-04           MRN: [id number]             PCP: Aurea Graff.Marlou Sa, MD Referring: Alroy Dust, Carlean Jews.Marlou Sa, MD Visit Date: 04/11/2018 Occupation: @GUAROCC @  Subjective:  No chief complaint on file.   History of Present Illness: Jessica [name] is a 67 y.o. female ***   Activities of Daily Living:  Patient reports morning stiffness for *** {minute/hour:19697}.   Patient {ACTIONS;DENIES/REPORTS:21021675::"Denies"} nocturnal pain.  Difficulty dressing/grooming: {ACTIONS;DENIES/REPORTS:21021675::"Denies"} Difficulty climbing stairs: {ACTIONS;DENIES/REPORTS:21021675::"Denies"} Difficulty getting out of chair: {ACTIONS;DENIES/REPORTS:21021675::"Denies"} Difficulty using hands for taps, buttons, cutlery, and/or writing: {ACTIONS;DENIES/REPORTS:21021675::"Denies"}  No Rheumatology ROS completed.   PMFS History:  Patient Active Problem List   Diagnosis Date Noted  . Primary osteoarthritis of both hands 04/19/2017  . Primary osteoarthritis of both knees 04/19/2017  . Primary osteoarthritis of both feet 04/19/2017  . DDD (degenerative disc disease), lumbar 04/19/2017  . History of bipolar disorder 04/19/2017  . Hypothyroidism 06/04/2014  . Chest pain 01/23/2013  . ARF (acute renal failure) ([place]) 01/23/2013  . HTN (hypertension) 01/23/2013  . Leukocytosis 01/23/2013  . HLD (hyperlipidemia) 01/23/2013    Past Medical History:  Diagnosis Date  . Arthritis    lower back, hands, knees  . Bipolar disorder ([place])   . Depression   . Dyspnea    with exertion  . GERD (gastroesophageal reflux disease)    patient unsure about this dx - no meds  . Headache   . History of IBS    watches diet  . Hyperlipidemia   . Hypertension   . Hypothyroidism   . Obese   . Plantar fasciitis   . Seasonal allergies   . Sleep apnea 2017   uses CPAP   . Spinal stenosis of lumbar region   . SVD (spontaneous vaginal delivery)      x 2  . Systemic lupus ([place])   . Thyroid disease     Family History  Problem Relation Age of Onset  . Thyroid disease Mother   . Breast cancer Mother   . Heart disease Mother   . Bipolar disorder Father   . Diabetes Father   . Heart disease Father   . Dementia Father    Past Surgical History:  Procedure Laterality Date  . CARPAL TUNNEL RELEASE Left 09/16/2017   Procedure: LEFT CARPAL TUNNEL RELEASE;  Surgeon: Daryll Brod, MD;  Location: [facility];  Service: Orthopedics;  Laterality: Left;  . carpel tunnel surgery Right   . CATARACT EXTRACTION Bilateral 2007   w/ lens implants  . COLONOSCOPY    . DILATION AND CURETTAGE OF UTERUS    . EYE SURGERY    . FOOT SURGERY Right    hammer toe  . FRACTURE SURGERY     R tibia and fibula  . LEG SURGERY Right   . TONSILLECTOMY    . WISDOM TOOTH EXTRACTION     Social History   Social History Narrative   She lives in a single level home with her husband.  She has two grown children.   She taught college accounting courses, retired in 2005.    Highest level of education:  Doctorate in accounting    Objective: Vital Signs: There were no vitals taken for this visit.   Physical Exam   Musculoskeletal Exam: ***  CDAI Exam: CDAI Score: Not documented Patient  Global Assessment: Not documented; Provider Global Assessment: Not documented Swollen: Not documented; Tender: Not documented Joint Exam   Not documented   There is currently no information documented on the homunculus. Go to the Rheumatology activity and complete the homunculus joint exam.  Investigation: No additional findings.  Imaging: No results found.  Recent Labs: Lab Results  Component Value Date   WBC 13.5 (H) 09/09/2017   HGB 12.4 09/09/2017   PLT 402 (H) 09/09/2017   NA 139 09/09/2017   K 4.4 09/09/2017   CL 108 09/09/2017   CO2 24 09/09/2017   GLUCOSE 101 (H) 09/09/2017   BUN 24 (H) 09/09/2017   CREATININE 0.84 09/09/2017   BILITOT 0.3 04/19/2017    ALKPHOS 85 12/27/2016   AST 21 04/19/2017   ALT 18 04/19/2017   PROT 6.4 08/24/2017   ALBUMIN 4.2 12/27/2016   CALCIUM 10.5 (H) 09/09/2017   GFRAA >60 09/09/2017    Speciality Comments: No specialty comments available.  Procedures:  No procedures performed Allergies: Ibuprofen; Codeine; Nabumetone; Promethazine; Amoxicillin-pot clavulanate; Erythromycin base; and Promethazine-codeine   Assessment / Plan:     Visit Diagnoses: No diagnosis found.   Orders: No orders of the defined types were placed in this encounter.  No orders of the defined types were placed in this encounter.   Face-to-face time spent with patient was *** minutes. Greater than 50% of time was spent in counseling and coordination of care.  Follow-Up Instructions: No follow-ups on file.   Earnestine Mealing, CMA  Note - This record has been created using Editor, commissioning.  Chart creation errors have been sought, but may not always  have been located. Such creation errors do not reflect on  the standard of medical care.

## 2018-04-05 DIAGNOSIS — G5602 Carpal tunnel syndrome, left upper limb: Secondary | ICD-10-CM | POA: Diagnosis not present

## 2018-04-06 DIAGNOSIS — F3175 Bipolar disorder, in partial remission, most recent episode depressed: Secondary | ICD-10-CM | POA: Diagnosis not present

## 2018-04-06 DIAGNOSIS — R8290 Unspecified abnormal findings in urine: Secondary | ICD-10-CM | POA: Diagnosis not present

## 2018-04-06 DIAGNOSIS — E039 Hypothyroidism, unspecified: Secondary | ICD-10-CM | POA: Diagnosis not present

## 2018-04-06 DIAGNOSIS — M545 Low back pain: Secondary | ICD-10-CM | POA: Diagnosis not present

## 2018-04-06 DIAGNOSIS — R609 Edema, unspecified: Secondary | ICD-10-CM | POA: Diagnosis not present

## 2018-04-06 DIAGNOSIS — R202 Paresthesia of skin: Secondary | ICD-10-CM | POA: Diagnosis not present

## 2018-04-06 DIAGNOSIS — I10 Essential (primary) hypertension: Secondary | ICD-10-CM | POA: Diagnosis not present

## 2018-04-06 DIAGNOSIS — N3281 Overactive bladder: Secondary | ICD-10-CM | POA: Diagnosis not present

## 2018-04-06 DIAGNOSIS — E7800 Pure hypercholesterolemia, unspecified: Secondary | ICD-10-CM | POA: Diagnosis not present

## 2018-04-10 ENCOUNTER — Other Ambulatory Visit: Payer: Self-pay | Admitting: Rheumatology

## 2018-04-10 NOTE — Telephone Encounter (Signed)
Last Visit: 04/19/17 Next Visit: 04/11/18 Labs: 09/09/17 elevated glucose elevated bun   Okay to refill per Dr. Estanislado Pandy

## 2018-04-11 ENCOUNTER — Ambulatory Visit: Payer: Self-pay | Admitting: Rheumatology

## 2018-04-17 DIAGNOSIS — N319 Neuromuscular dysfunction of bladder, unspecified: Secondary | ICD-10-CM | POA: Diagnosis not present

## 2018-04-17 DIAGNOSIS — N950 Postmenopausal bleeding: Secondary | ICD-10-CM | POA: Diagnosis not present

## 2018-04-17 DIAGNOSIS — Z124 Encounter for screening for malignant neoplasm of cervix: Secondary | ICD-10-CM | POA: Diagnosis not present

## 2018-04-17 DIAGNOSIS — B372 Candidiasis of skin and nail: Secondary | ICD-10-CM | POA: Diagnosis not present

## 2018-04-17 DIAGNOSIS — Z01419 Encounter for gynecological examination (general) (routine) without abnormal findings: Secondary | ICD-10-CM | POA: Diagnosis not present

## 2018-04-18 DIAGNOSIS — M170 Bilateral primary osteoarthritis of knee: Secondary | ICD-10-CM | POA: Diagnosis not present

## 2018-04-19 ENCOUNTER — Encounter: Payer: Self-pay | Admitting: Emergency Medicine

## 2018-04-19 DIAGNOSIS — G4733 Obstructive sleep apnea (adult) (pediatric): Secondary | ICD-10-CM | POA: Diagnosis not present

## 2018-04-20 DIAGNOSIS — H04123 Dry eye syndrome of bilateral lacrimal glands: Secondary | ICD-10-CM | POA: Diagnosis not present

## 2018-04-20 DIAGNOSIS — H353131 Nonexudative age-related macular degeneration, bilateral, early dry stage: Secondary | ICD-10-CM | POA: Diagnosis not present

## 2018-04-20 DIAGNOSIS — H11821 Conjunctivochalasis, right eye: Secondary | ICD-10-CM | POA: Diagnosis not present

## 2018-05-02 ENCOUNTER — Other Ambulatory Visit: Payer: Self-pay | Admitting: Psychiatry

## 2018-05-02 NOTE — Telephone Encounter (Signed)
Need to check paper chart

## 2018-05-04 ENCOUNTER — Other Ambulatory Visit: Payer: Self-pay

## 2018-05-04 MED ORDER — DULOXETINE HCL 60 MG PO CPEP: 60.0000 mg | ORAL_CAPSULE | ORAL | 0 refills | Status: DC

## 2018-05-08 DIAGNOSIS — I10 Essential (primary) hypertension: Secondary | ICD-10-CM | POA: Diagnosis not present

## 2018-05-08 DIAGNOSIS — Z6841 Body Mass Index (BMI) 40.0 and over, adult: Secondary | ICD-10-CM | POA: Diagnosis not present

## 2018-05-08 DIAGNOSIS — M545 Low back pain: Secondary | ICD-10-CM | POA: Diagnosis not present

## 2018-05-09 ENCOUNTER — Ambulatory Visit (INDEPENDENT_AMBULATORY_CARE_PROVIDER_SITE_OTHER): Payer: Medicare Other | Admitting: Psychiatry

## 2018-05-09 ENCOUNTER — Encounter: Payer: Self-pay | Admitting: Psychiatry

## 2018-05-09 DIAGNOSIS — F900 Attention-deficit hyperactivity disorder, predominantly inattentive type: Secondary | ICD-10-CM

## 2018-05-09 DIAGNOSIS — F319 Bipolar disorder, unspecified: Secondary | ICD-10-CM | POA: Diagnosis not present

## 2018-05-09 DIAGNOSIS — G4733 Obstructive sleep apnea (adult) (pediatric): Secondary | ICD-10-CM | POA: Diagnosis not present

## 2018-05-09 MED ORDER — LISDEXAMFETAMINE DIMESYLATE 60 MG PO CAPS: 60.0000 mg | ORAL_CAPSULE | ORAL | 0 refills | Status: DC

## 2018-05-09 NOTE — Progress Notes (Signed)
Jessica [name] [id number] [birth year] 68 y.o.  Subjective:   Patient ID:  Jessica [name] is a 68 y.o. (DOB Jun 09, 1950) female.  Chief Complaint:  Chief Complaint  Patient presents with  . Follow-up    Medication Management  . Depression  . Stress    health    HPI  Last seen 01/19/18 Jessica [name] presents to the office today for follow-up of bipolar disorder.  Lately more depressed than anything.  Sleep excessively DT meds or OSA or depression.  Low motivation.  Noticeable since before Xmas.  Struggles with productivity.  Couldn't help much with meals or anything at [facility] DT health and sleepiness.  Chronic conflict with Jessica [name] and sent her a nasty note to that effect,; caused stresss.    Has had a spell of irritability for days.  1 day of death thoughts.  Had missed some meds at the time may have been the cause.  Can be hard to keep up with meds at times. Asked questions about that.  Current depression not severe.  Sleep poor with EMA.  Lies in bed a lot.  Is using CPAP.  Her doctor at [facility] says it's the worst case she's ever seen.  Not driving per the sleep doctor.    Gabapentin helps bladder pain.   Review of Systems:  Review of Systems  Constitutional: Positive for fatigue.  Genitourinary: Positive for frequency, pelvic pain and urgency.  Musculoskeletal: Positive for arthralgias, back pain and gait problem.       Uses walker  Neurological: Positive for weakness. Negative for tremors.  Psychiatric/Behavioral: Positive for dysphoric mood. Negative for agitation, behavioral problems, confusion, decreased concentration, hallucinations, self-injury, sleep disturbance and suicidal ideas. The patient is not nervous/anxious and is not hyperactive.     Medications: I have reviewed the patient's current medications.  Current Outpatient Medications  Medication Sig Dispense Refill  . acetaminophen (TYLENOL ARTHRITIS PAIN) 650 MG CR tablet Take 1,300 mg every 8  (eight) hours as needed by mouth for pain.     . Acetylcysteine (NAC) 600 MG CAPS Take 600 mg by mouth 2 (two) times daily.    Marland Kitchen amLODipine (NORVASC) 5 MG tablet Take 5 mg at bedtime by mouth.  2  . celecoxib (CELEBREX) 200 MG capsule TAKE 1 CAPSULE BY MOUTH EVERY DAY 30 capsule 0  . Cholecalciferol (VITAMIN D) 2000 units CAPS Take 2,000 Units daily by mouth.    . cycloSPORINE (RESTASIS) 0.05 % ophthalmic emulsion Place 1 drop into both eyes 2 (two) times daily.    . DULoxetine (CYMBALTA) 60 MG capsule Take 1 capsule (60 mg total) by mouth every morning. 90 capsule 0  . fluticasone (FLONASE) 50 MCG/ACT nasal spray Place 1-2 sprays into both nostrils daily as needed for allergies.  2  . furosemide (LASIX) 20 MG tablet TAKE 1 TABLET BY MOUTH EVERY DAY    . gabapentin (NEURONTIN) 300 MG capsule 900 mg every morning.    Marland Kitchen HYDROcodone-acetaminophen (NORCO) 5-325 MG tablet Take 1 tablet by mouth every 6 (six) hours as needed for moderate pain. 30 tablet 0  . levothyroxine (SYNTHROID) 25 MCG tablet Synthroid 25 mcg tablet once daily    . levothyroxine (SYNTHROID, LEVOTHROID) 150 MCG tablet Take 200 mcg by mouth daily before breakfast.     . lisdexamfetamine (VYVANSE) 60 MG capsule Take 1 capsule (60 mg total) by mouth every morning. 30 capsule 0  . lithium carbonate (LITHOBID) 300 MG CR tablet Take 300-600 mg by mouth See  admin instructions. 300mg  in the AM and 600mg  (take an additional 150mg  capsule)at bedtime.  2  . lithium carbonate 150 MG capsule Take 150 mg by mouth at bedtime. Take with 600mg  lithium carbonate at bedtime to equal 750mg     . loratadine (CLARITIN) 10 MG tablet Take 10 mg by mouth daily as needed for allergies.     . methocarbamol (ROBAXIN) 500 MG tablet Take 500 mg by mouth 3 (three) times daily as needed for muscle spasms.   3  . mirabegron ER (MYRBETRIQ) 50 MG TB24 tablet Take 50 mg at bedtime by mouth.     . Multiple Vitamins-Minerals (MULTIVITAMIN WITH MINERALS) tablet Take 1  tablet by mouth daily.    Marland Kitchen nystatin cream (MYCOSTATIN) Apply 1 application topically 2 (two) times daily as needed (rash).    . pravastatin (PRAVACHOL) 20 MG tablet Take 20 mg at bedtime by mouth.     . tobramycin-dexamethasone (TOBRADEX) ophthalmic ointment TobraDex 0.3 %-0.1 % eye ointment  APPLY THIN LAYER TO AFFECTED LID AREA OF LEFT EYE TWICE DAILY FOR 21 DAYS TOTAL    . acetaminophen (TYLENOL) 500 MG tablet Take 1,000 mg by mouth every 6 (six) hours as needed for moderate pain.    . Carboxymethylcellulose Sod PF (RETAINE CMC) 0.5 % SOLN Place 1 drop every 2 (two) hours as needed into both eyes (dry eyes).    Derrill Memo ON 06/06/2018] lisdexamfetamine (VYVANSE) 60 MG capsule Take 1 capsule (60 mg total) by mouth every morning. 30 capsule 0  . [START ON 07/04/2018] lisdexamfetamine (VYVANSE) 60 MG capsule Take 1 capsule (60 mg total) by mouth every morning. 30 capsule 0  . LORazepam (ATIVAN) 0.5 MG tablet Take 0.5 mg by mouth 2 (two) times daily.    Marland Kitchen LORazepam (ATIVAN) 1 MG tablet lorazepam 1 mg tablet  TAKE 1 TO 2 TABLETS BY MOUTH AS NEEDED FOR ANXIETY    . Multiple Vitamins-Minerals (PRESERVISION AREDS 2) CAPS Take 1 capsule 2 (two) times daily by mouth.     No current facility-administered medications for this visit.     Medication Side Effects: Other: ? sleepiness unlikely related.  Allergies:  Allergies  Allergen Reactions  . Ibuprofen Diarrhea, Nausea Only and Other (See Comments)    Extreme stomach pain  Makes her feel bad  . Codeine     UNSPECIFIED REACTION   . Nabumetone     UNSPECIFIED REACTION   . Promethazine     UNSPECIFIED REACTION   . Amoxicillin-Pot Clavulanate Nausea And Vomiting  . Erythromycin Base Diarrhea  . Promethazine-Codeine Other (See Comments)    "Makes her feel bad"    Past Medical History:  Diagnosis Date  . Arthritis    lower back, hands, knees  . Bipolar disorder ([place])   . Depression   . Dyspnea    with exertion  . GERD (gastroesophageal  reflux disease)    patient unsure about this dx - no meds  . Headache   . History of IBS    watches diet  . Hyperlipidemia   . Hypertension   . Hypothyroidism   . Obese   . Plantar fasciitis   . Seasonal allergies   . Sleep apnea 2017   uses CPAP   . Spinal stenosis of lumbar region   . SVD (spontaneous vaginal delivery)    x 2  . Systemic lupus ([place])   . Thyroid disease     Family History  Problem Relation Age of Onset  . Thyroid disease Mother   .  Breast cancer Mother   . Heart disease Mother   . Bipolar disorder Father   . Diabetes Father   . Heart disease Father   . Dementia Father     Social History   Socioeconomic History  . Marital status: Married    Spouse name: Not on file  . Number of children: Not on file  . Years of education: Not on file  . Highest education level: Not on file  Occupational History  . Not on file  Social Needs  . Financial resource strain: Not on file  . Food insecurity:    Worry: Not on file    Inability: Not on file  . Transportation needs:    Medical: Not on file    Non-medical: Not on file  Tobacco Use  . Smoking status: Never Smoker  . Smokeless tobacco: Never Used  Substance and Sexual Activity  . Alcohol use: Yes    Comment: rarely  . Drug use: No  . Sexual activity: Not on file  Lifestyle  . Physical activity:    Days per week: Not on file    Minutes per session: Not on file  . Stress: Not on file  Relationships  . Social connections:    Talks on phone: Not on file    Gets together: Not on file    Attends religious service: Not on file    Active member of club or organization: Not on file    Attends meetings of clubs or organizations: Not on file    Relationship status: Not on file  . Intimate partner violence:    Fear of current or ex partner: Not on file    Emotionally abused: Not on file    Physically abused: Not on file    Forced sexual activity: Not on file  Other Topics Concern  . Not on file   Social History Narrative   She lives in a single level home with her husband.  She has two grown children.   She taught college accounting courses, retired in 2005.    Highest level of education:  Doctorate in accounting    Past Medical History, Surgical history, Social history, and Family history were reviewed and updated as appropriate.   5 gkids 15 to 7 mos.  Please see review of systems for further details on the patient's review from today.   Objective:   Physical Exam:  There were no vitals taken for this visit.  Physical Exam Constitutional:      General: She is not in acute distress.    Appearance: She is well-developed. She is obese.  Musculoskeletal:        General: No deformity.  Neurological:     Mental Status: She is alert and oriented to person, place, and time.     Motor: No tremor.     Coordination: Coordination normal.     Gait: Gait abnormal.  Psychiatric:        Attention and Perception: Attention and perception normal.        Mood and Affect: Mood is depressed. Mood is not anxious. Affect is not labile, blunt, angry or inappropriate.        Speech: Speech normal.        Behavior: Behavior normal.        Thought Content: Thought content normal. Thought content does not include homicidal or suicidal ideation. Thought content does not include homicidal or suicidal plan.        Cognition and Memory:  Cognition normal.        Judgment: Judgment normal.     Comments: Insight and judgment fair.  Chronic tendency to irritability. No auditory or visual hallucinations. No delusions.      Lab Review:     Component Value Date/Time   NA 139 09/09/2017 1410   K 4.4 09/09/2017 1410   CL 108 09/09/2017 1410   CO2 24 09/09/2017 1410   GLUCOSE 101 (H) 09/09/2017 1410   BUN 24 (H) 09/09/2017 1410   CREATININE 0.84 09/09/2017 1410   CREATININE 0.82 04/19/2017 1636   CALCIUM 10.5 (H) 09/09/2017 1410   PROT 6.4 08/24/2017 1111   ALBUMIN 4.2 12/27/2016 1538   AST  21 04/19/2017 1636   ALT 18 04/19/2017 1636   ALKPHOS 85 12/27/2016 1538   BILITOT 0.3 04/19/2017 1636   GFRNONAA >60 09/09/2017 1410   GFRNONAA 74 04/19/2017 1636   GFRAA >60 09/09/2017 1410   GFRAA 86 04/19/2017 1636       Component Value Date/Time   WBC 13.5 (H) 09/09/2017 1410   RBC 4.05 09/09/2017 1410   HGB 12.4 09/09/2017 1410   HCT 39.6 09/09/2017 1410   PLT 402 (H) 09/09/2017 1410   MCV 97.8 09/09/2017 1410   MCH 30.6 09/09/2017 1410   MCHC 31.3 09/09/2017 1410   RDW 15.5 09/09/2017 1410   LYMPHSABS 1,837 04/19/2017 1636   MONOABS 1,210 (H) 12/27/2016 1538   EOSABS 386 04/19/2017 1636   BASOSABS 70 04/19/2017 1636    Lithium Lvl  Date Value Ref Range Status  01/27/2018 1.0 0.6 - 1.2 mmol/L Final    Lithium 1.0 on 04/06/18 and other labs that date.   No results found for: PHENYTOIN, PHENOBARB, VALPROATE, CBMZ   .res Assessment: Plan:    Bipolar I disorder ([place])  Obstructive sleep apnea  Attention deficit hyperactivity disorder (ADHD), predominantly inattentive type - Plan: lisdexamfetamine (VYVANSE) 60 MG capsule, lisdexamfetamine (VYVANSE) 60 MG capsule, lisdexamfetamine (VYVANSE) 60 MG capsule  Very severe.  Mild Cognitive Impairment stable.  Emphasized the importance of CPAP as she struggels with the treatment.  Talked about the effect of OSA on the brain.    Lithium level is good.  BMP stable except watch hypercalcemia.  She has not done adequately well with alternative mood stabilizers.  Disc lab tests.  Disc need to monitor hypercalcemia.  Answered her questions about the bipolar diagnoses.    Disc stress of D in law and chronic health problems and loss of driving independence.  Discussed potential benefits, risks, and side effects of stimulants with patient to include increased heart rate, palpitations, insomnia, increased anxiety, increased irritability, or decreased appetite.  Instructed patient to contact office if experiencing any significant  tolerability issues.  Unlikely that further med changes would help in view of her severe obesity and severe OSA.  She thinks some of the sleepiness is the gabapentin which helps her pelvic pain.  This appt was 30 mins.  FU 3 mos  Lynder Parents, MD, DFAPA   Please see After Visit Summary for patient specific instructions.  No future appointments.  No orders of the defined types were placed in this encounter.     -------------------------------

## 2018-05-12 NOTE — Progress Notes (Signed)
Office Visit Note  Patient: Jessica [name]             Date of Birth: [birth year]           MRN: [id number]             PCP: Aurea Graff.Marlou Sa, MD Referring: Alroy Dust, Carlean Jews.Marlou Sa, MD Visit Date: 05/15/2018 Occupation: @GUAROCC @  Subjective:  Pain in multiple joints   History of Present Illness: Jessica [name] is a 68 y.o. female with history of osteoarthritis and DDD.  She takes celebrex 200 mg 1 capsule by mouth daily for pain relief.  She states that she continues to have chronic pain in multiple joints including both hands, both knees, and both feet.  She states that she followed up with an orthopedist at [facility] to discuss the knee pain she is experiencing.  They discussed trying knee joint injections, but she felt the pain was not severe enough to proceed with injections.  She walks with a roller walker.  She reports she has chronic lower back pain and has been evaluated by a spine specialist.  She reports she had injections in the past that did not provide much relief. She was started on gabapentin, but she did not feel that her pain improved but did notice improvement in her incontinence.  She had a left carpal tunnel release on 09/16/17.  Her most recently follow up with Dr. Fredna Dow was on 04/05/18 at which point he released her.    Activities of Daily Living:  Patient reports morning stiffness for 2 hours.   Patient Denies nocturnal pain.  Difficulty dressing/grooming: Denies Difficulty climbing stairs: Reports Difficulty getting out of chair: Reports Difficulty using hands for taps, buttons, cutlery, and/or writing: Denies  Review of Systems  Constitutional: Positive for fatigue.  HENT: Positive for mouth dryness. Negative for mouth sores and nose dryness.   Eyes: Positive for dryness. Negative for pain and visual disturbance.  Respiratory: Negative for cough, hemoptysis, shortness of breath and difficulty breathing.   Cardiovascular: Positive for swelling in legs/feet.  Negative for chest pain, palpitations and hypertension.  Gastrointestinal: Negative for blood in stool, constipation and diarrhea.  Endocrine: Negative for increased urination.  Genitourinary: Negative for painful urination.  Musculoskeletal: Positive for arthralgias, joint pain, myalgias, morning stiffness, muscle tenderness and myalgias. Negative for joint swelling and muscle weakness.  Skin: Negative for color change, pallor, rash, hair loss, nodules/bumps, skin tightness, ulcers and sensitivity to sunlight.  Allergic/Immunologic: Negative for susceptible to infections.  Neurological: Negative for dizziness, numbness, headaches and weakness.  Hematological: Negative for swollen glands.  Psychiatric/Behavioral: Positive for depressed mood and sleep disturbance. The patient is nervous/anxious.     PMFS History:  Patient Active Problem List   Diagnosis Date Noted  . Primary osteoarthritis of both hands 04/19/2017  . Primary osteoarthritis of both knees 04/19/2017  . Primary osteoarthritis of both feet 04/19/2017  . DDD (degenerative disc disease), lumbar 04/19/2017  . History of bipolar disorder 04/19/2017  . Hypothyroidism 06/04/2014  . Chest pain 01/23/2013  . ARF (acute renal failure) ([place]) 01/23/2013  . HTN (hypertension) 01/23/2013  . Leukocytosis 01/23/2013  . HLD (hyperlipidemia) 01/23/2013    Past Medical History:  Diagnosis Date  . Arthritis    lower back, hands, knees  . Bipolar disorder ([place])   . Depression   . Dyspnea    with exertion  . GERD (gastroesophageal reflux disease)    patient unsure about this dx - no meds  . Headache   .  History of IBS    watches diet  . Hyperlipidemia   . Hypertension   . Hypothyroidism   . Obese   . Plantar fasciitis   . Seasonal allergies   . Sleep apnea 2017   uses CPAP   . Spinal stenosis of lumbar region   . SVD (spontaneous vaginal delivery)    x 2  . Systemic lupus ([place])   . Thyroid disease     Family History    Problem Relation Age of Onset  . Thyroid disease Mother   . Breast cancer Mother   . Heart disease Mother   . Bipolar disorder Father   . Diabetes Father   . Heart disease Father   . Dementia Father    Past Surgical History:  Procedure Laterality Date  . CARPAL TUNNEL RELEASE Left 09/16/2017   Procedure: LEFT CARPAL TUNNEL RELEASE;  Surgeon: Daryll Brod, MD;  Location: [facility];  Service: Orthopedics;  Laterality: Left;  . carpel tunnel surgery Right   . CATARACT EXTRACTION Bilateral 2007   w/ lens implants  . COLONOSCOPY    . DILATION AND CURETTAGE OF UTERUS    . EYE SURGERY    . FOOT SURGERY Right    hammer toe  . FRACTURE SURGERY     R tibia and fibula  . LEG SURGERY Right   . TONSILLECTOMY    . WISDOM TOOTH EXTRACTION     Social History   Social History Narrative   She lives in a single level home with her husband.  She has two grown children.   She taught college accounting courses, retired in 2005.    Highest level of education:  Doctorate in accounting    There is no immunization history on file for this patient.   Objective: Vital Signs: BP (!) 154/85 (BP Location: Left Wrist, Patient Position: Sitting, Cuff Size: Normal)   Pulse 89   Resp 18   Ht 5\' 2"  (1.575 m)   Wt (!) 316 lb (143.3 kg)   BMI 57.80 kg/m    Physical Exam Vitals signs and nursing note reviewed.  Constitutional:      Appearance: She is well-developed.  HENT:     Head: Normocephalic and atraumatic.  Eyes:     Conjunctiva/sclera: Conjunctivae normal.  Neck:     Musculoskeletal: Normal range of motion.  Cardiovascular:     Rate and Rhythm: Normal rate and regular rhythm.     Heart sounds: Normal heart sounds.     Comments: Bilateral pedal edema was noted. Pulmonary:     Effort: Pulmonary effort is normal.     Breath sounds: Normal breath sounds.  Abdominal:     General: Bowel sounds are normal.     Palpations: Abdomen is soft.  Lymphadenopathy:     Cervical: No cervical  adenopathy.  Skin:    General: Skin is warm and dry.     Capillary Refill: Capillary refill takes less than 2 seconds.  Neurological:     Mental Status: She is alert and oriented to person, place, and time.  Psychiatric:        Behavior: Behavior normal.      Musculoskeletal Exam: C-spine good ROM.  Limited ROM of thoracic and lumbar spine with discomfort.  Shoulder joints, elbow joints, wrist joints, MCPs, PIPs, and DIPs good ROM with no synovitis. Hip joints good ROM with no discomfort.  Knee joints good ROM with no warmth or effusion.  No tenderness of ankle joints.  Pedal edema  bilaterally.    CDAI Exam: CDAI Score: Not documented Patient Global Assessment: Not documented; Provider Global Assessment: Not documented Swollen: Not documented; Tender: Not documented Joint Exam   Not documented   There is currently no information documented on the homunculus. Go to the Rheumatology activity and complete the homunculus joint exam.  Investigation: No additional findings.  Imaging: No results found.  Recent Labs: Lab Results  Component Value Date   WBC 13.5 (H) 09/09/2017   HGB 12.4 09/09/2017   PLT 402 (H) 09/09/2017   NA 139 09/09/2017   K 4.4 09/09/2017   CL 108 09/09/2017   CO2 24 09/09/2017   GLUCOSE 101 (H) 09/09/2017   BUN 24 (H) 09/09/2017   CREATININE 0.84 09/09/2017   BILITOT 0.3 04/19/2017   ALKPHOS 85 12/27/2016   AST 21 04/19/2017   ALT 18 04/19/2017   PROT 6.4 08/24/2017   ALBUMIN 4.2 12/27/2016   CALCIUM 10.5 (H) 09/09/2017   GFRAA >60 09/09/2017    Speciality Comments: No specialty comments available.  Procedures:  No procedures performed Allergies: Ibuprofen; Codeine; Nabumetone; Promethazine; Amoxicillin-pot clavulanate; Erythromycin base; and Promethazine-codeine   Assessment / Plan:     Visit Diagnoses: Primary osteoarthritis of both hands:   Primary osteoarthritis of both knees - chondromalacia patella: She has no warmth or effusion on  exam. She has good ROM.  She has chronic pain in both knee joints.  She was evaluated by an orthopedist at [facility] who offered knee joint injections but she declined.  She walks with a roller walker.  She takes celebrex 200 mg 1 tablet daily for pain relief.  Primary osteoarthritis of both feet: She has PIP and DIP synovial thickening consistent with osteoarthritis of both feet.  She has chronic pain in both feet.  She has significant pedal edema which makes it uncomfortable to wear close toed shoes.   Medication monitoring encounter - She takes Celebrex 200 mg 1 tablet by mouth daily.  CBC and CMP were drawn today to monitor for drug toxicity.    DDD (degenerative disc disease), lumbar: She has limited ROM and chronic lower back pain.  She has been evaluated by a spine specialist in the past.  She has had injections in the past which did not provide much benefit.  She was started on Gabapentin which did not provide pain relief but did help with incontinence.    Other medical conditions are listed as follows:   History of IBS  History of hypertension  History of hypothyroidism  History of urinary incontinence  History of bipolar disorder  Pedal edema   Orders: Orders Placed This Encounter  Procedures  . CBC with Differential/Platelet  . COMPLETE METABOLIC PANEL WITH GFR   No orders of the defined types were placed in this encounter.    Follow-Up Instructions: Return in about 1 year (around 05/16/2019) for Osteoarthritis, DDD.   Hazel Sams, PA-C  I examined and evaluated the patient with Hazel Sams PA.  Patient has been experiencing generalized discomfort and pain from osteoarthritis involving multiple joints.  I did not notice any synovitis on my examination.  We will continue with current regimen.  Need for regular exercise was emphasized.  The plan of care was discussed as noted above.  Bo Merino, MD  Note - This record has been created using Editor, commissioning.   Chart creation errors have been sought, but may not always  have been located. Such creation errors do not reflect on  the standard of  medical care.

## 2018-05-15 ENCOUNTER — Ambulatory Visit (INDEPENDENT_AMBULATORY_CARE_PROVIDER_SITE_OTHER): Payer: Medicare Other | Admitting: Rheumatology

## 2018-05-15 ENCOUNTER — Encounter: Payer: Self-pay | Admitting: Rheumatology

## 2018-05-15 VITALS — BP 154/85 | HR 89 | Resp 18 | Ht 62.0 in | Wt 316.0 lb

## 2018-05-15 DIAGNOSIS — Z8719 Personal history of other diseases of the digestive system: Secondary | ICD-10-CM | POA: Diagnosis not present

## 2018-05-15 DIAGNOSIS — R600 Localized edema: Secondary | ICD-10-CM

## 2018-05-15 DIAGNOSIS — M19071 Primary osteoarthritis, right ankle and foot: Secondary | ICD-10-CM

## 2018-05-15 DIAGNOSIS — M5136 Other intervertebral disc degeneration, lumbar region: Secondary | ICD-10-CM | POA: Diagnosis not present

## 2018-05-15 DIAGNOSIS — Z8639 Personal history of other endocrine, nutritional and metabolic disease: Secondary | ICD-10-CM

## 2018-05-15 DIAGNOSIS — Z5181 Encounter for therapeutic drug level monitoring: Secondary | ICD-10-CM | POA: Diagnosis not present

## 2018-05-15 DIAGNOSIS — M19042 Primary osteoarthritis, left hand: Secondary | ICD-10-CM | POA: Diagnosis not present

## 2018-05-15 DIAGNOSIS — M170 Bilateral primary osteoarthritis of knee: Secondary | ICD-10-CM | POA: Diagnosis not present

## 2018-05-15 DIAGNOSIS — Z8679 Personal history of other diseases of the circulatory system: Secondary | ICD-10-CM | POA: Diagnosis not present

## 2018-05-15 DIAGNOSIS — M19072 Primary osteoarthritis, left ankle and foot: Secondary | ICD-10-CM

## 2018-05-15 DIAGNOSIS — Z87898 Personal history of other specified conditions: Secondary | ICD-10-CM | POA: Diagnosis not present

## 2018-05-15 DIAGNOSIS — M19041 Primary osteoarthritis, right hand: Secondary | ICD-10-CM

## 2018-05-15 DIAGNOSIS — Z8659 Personal history of other mental and behavioral disorders: Secondary | ICD-10-CM | POA: Diagnosis not present

## 2018-05-16 LAB — COMPLETE METABOLIC PANEL WITH GFR
AG Ratio: 1.7 (calc) (ref 1.0–2.5)
ALT: 17 U/L (ref 6–29)
AST: 20 U/L (ref 10–35)
Albumin: 4.1 g/dL (ref 3.6–5.1)
Alkaline phosphatase (APISO): 116 U/L (ref 33–130)
BUN: 21 mg/dL (ref 7–25)
CO2: 27 mmol/L (ref 20–32)
Calcium: 10.6 mg/dL — ABNORMAL HIGH (ref 8.6–10.4)
Chloride: 104 mmol/L (ref 98–110)
Creat: 0.9 mg/dL (ref 0.50–0.99)
GFR, Est African American: 76 mL/min/{1.73_m2} (ref >=60)
GFR, Est Non African American: 66 mL/min/{1.73_m2} (ref >=60)
Globulin: 2.4 g/dL (calc) (ref 1.9–3.7)
Glucose, Bld: 99 mg/dL (ref 65–99)
Potassium: 4.9 mmol/L (ref 3.5–5.3)
Sodium: 135 mmol/L (ref 135–146)
Total Bilirubin: 0.3 mg/dL (ref 0.2–1.2)
Total Protein: 6.5 g/dL (ref 6.1–8.1)

## 2018-05-16 LAB — CBC WITH DIFFERENTIAL/PLATELET
Absolute Monocytes: 1220 cells/uL — ABNORMAL HIGH (ref 200–950)
Basophils Absolute: 91 cells/uL (ref 0–200)
Basophils Relative: 0.8 %
Eosinophils Absolute: 433 cells/uL (ref 15–500)
Eosinophils Relative: 3.8 %
HCT: 38.4 % (ref 35.0–45.0)
Hemoglobin: 12.4 g/dL (ref 11.7–15.5)
Lymphs Abs: 1562 cells/uL (ref 850–3900)
MCH: 30.4 pg (ref 27.0–33.0)
MCHC: 32.3 g/dL (ref 32.0–36.0)
MCV: 94.1 fL (ref 80.0–100.0)
MPV: 10 fL (ref 7.5–12.5)
Monocytes Relative: 10.7 %
Neutro Abs: 8094 cells/uL — ABNORMAL HIGH (ref 1500–7800)
Neutrophils Relative %: 71 %
Platelets: 354 10*3/uL (ref 140–400)
RBC: 4.08 10*6/uL (ref 3.80–5.10)
RDW: 13 % (ref 11.0–15.0)
Total Lymphocyte: 13.7 %
WBC: 11.4 10*3/uL — ABNORMAL HIGH (ref 3.8–10.8)

## 2018-05-16 NOTE — Progress Notes (Signed)
Labs are stable. Calcium is elevated. Please advise patient to avoid taking a calcium supplement at this time.  Please forward labs to PCP.

## 2018-05-18 ENCOUNTER — Other Ambulatory Visit: Payer: Self-pay | Admitting: Rheumatology

## 2018-05-18 NOTE — Telephone Encounter (Signed)
Last Visit: 05/15/18 Next Visit: 05/21/18 Labs: 05/15/18 Labs are stable. Calcium is elevated  Okay to refill per Dr. Estanislado Pandy

## 2018-05-22 ENCOUNTER — Other Ambulatory Visit: Payer: Self-pay | Admitting: Rheumatology

## 2018-05-23 NOTE — Telephone Encounter (Signed)
Last Visit: 05/15/18 Next Visit: 05/21/18  Okay to refill per Dr. Estanislado Pandy

## 2018-06-22 DIAGNOSIS — R609 Edema, unspecified: Secondary | ICD-10-CM | POA: Diagnosis not present

## 2018-06-22 DIAGNOSIS — G629 Polyneuropathy, unspecified: Secondary | ICD-10-CM | POA: Diagnosis not present

## 2018-06-22 DIAGNOSIS — S7011XA Contusion of right thigh, initial encounter: Secondary | ICD-10-CM | POA: Diagnosis not present

## 2018-06-22 DIAGNOSIS — B356 Tinea cruris: Secondary | ICD-10-CM | POA: Diagnosis not present

## 2018-06-22 DIAGNOSIS — Z6841 Body Mass Index (BMI) 40.0 and over, adult: Secondary | ICD-10-CM | POA: Diagnosis not present

## 2018-07-05 DIAGNOSIS — N3281 Overactive bladder: Secondary | ICD-10-CM | POA: Diagnosis not present

## 2018-07-05 DIAGNOSIS — M545 Low back pain: Secondary | ICD-10-CM | POA: Diagnosis not present

## 2018-07-05 DIAGNOSIS — R32 Unspecified urinary incontinence: Secondary | ICD-10-CM | POA: Diagnosis not present

## 2018-07-05 DIAGNOSIS — J029 Acute pharyngitis, unspecified: Secondary | ICD-10-CM | POA: Diagnosis not present

## 2018-07-05 DIAGNOSIS — M79671 Pain in right foot: Secondary | ICD-10-CM | POA: Diagnosis not present

## 2018-07-25 DIAGNOSIS — B356 Tinea cruris: Secondary | ICD-10-CM | POA: Diagnosis not present

## 2018-07-27 ENCOUNTER — Other Ambulatory Visit: Payer: Self-pay | Admitting: Psychiatry

## 2018-07-28 ENCOUNTER — Other Ambulatory Visit: Payer: Self-pay

## 2018-07-28 MED ORDER — LITHIUM CARBONATE 150 MG PO CAPS: 150.0000 mg | ORAL_CAPSULE | Freq: Every day | ORAL | 0 refills | Status: DC

## 2018-08-08 ENCOUNTER — Encounter: Payer: Self-pay | Admitting: Psychiatry

## 2018-08-08 ENCOUNTER — Ambulatory Visit (INDEPENDENT_AMBULATORY_CARE_PROVIDER_SITE_OTHER): Payer: Medicare Other | Admitting: Psychiatry

## 2018-08-08 ENCOUNTER — Other Ambulatory Visit: Payer: Self-pay

## 2018-08-08 DIAGNOSIS — F319 Bipolar disorder, unspecified: Secondary | ICD-10-CM

## 2018-08-08 DIAGNOSIS — F411 Generalized anxiety disorder: Secondary | ICD-10-CM | POA: Diagnosis not present

## 2018-08-08 DIAGNOSIS — F900 Attention-deficit hyperactivity disorder, predominantly inattentive type: Secondary | ICD-10-CM

## 2018-08-08 DIAGNOSIS — G4733 Obstructive sleep apnea (adult) (pediatric): Secondary | ICD-10-CM | POA: Diagnosis not present

## 2018-08-08 MED ORDER — LISDEXAMFETAMINE DIMESYLATE 60 MG PO CAPS: 60.0000 mg | ORAL_CAPSULE | ORAL | 0 refills | Status: DC

## 2018-08-08 NOTE — Progress Notes (Addendum)
Jessica [name] [id number] [birth year] 68 y.o.  Subjective:   Patient ID:  Jessica [name] is a 68 y.o. (DOB [birth year]) female.  Chief Complaint:  Chief Complaint  Patient presents with  . Anxiety  . Depression  . Memory Loss  . Fatigue    HPI  Jessica [name] presents to the office today for follow-up of bipolar disorder.  Last visit January 7. No meds changed.  Overall doing remarkably well given stressors.  Physically feels bad a lot of the time.  Hay fever bad.  Not much mobility, gets SOB.  PCP says its weight, she's not sure.  Normally likes looking for wildflowers and can't do much. Mood better than expected under the circumstances with Covid.  Stoic about it.  Trusting God.  Sleep excessively DT meds or OSA or depression.  Low motivation.  Noticeable since before Xmas.  Struggles with productivity.  Couldn't help much with meals or anything at [facility] DT health and sleepiness.  Chronic conflict with [name] and sent her a nasty note to that effect,; caused stresss.  Seeing sleep doctor.  Has had a spell of irritability for days.  1 day of death thoughts.  Had missed some meds at the time may have been the cause.  Can be hard to keep up with meds at times. Asked questions about that.  Current depression not severe.  Sleep poor with EMA.  Lies in bed a lot.  Is using CPAP.  Her doctor at [facility] says it's the worst case she's ever seen.  Not driving per the sleep doctor.   Naps and broken sleep.  Gabapentin helps bladder pain. No concerns about meds.  Past Psychiatric Medication Trials: Abilify, Seroquel 600, Depakote, olanzapine,, ziprasidone, venlafaxine, lamotrigine 200, Ritalin, Lunesta, lithium, lorazepam, Nuvigil, Vyvanse, citalopram and others   Review of Systems:  Review of Systems  Constitutional: Positive for fatigue.  Genitourinary: Positive for frequency, pelvic pain and urgency.  Musculoskeletal: Positive for arthralgias, back pain and gait problem.        Uses walker  Neurological: Positive for weakness. Negative for tremors.  Psychiatric/Behavioral: Positive for dysphoric mood. Negative for agitation, behavioral problems, confusion, decreased concentration, hallucinations, self-injury, sleep disturbance and suicidal ideas. The patient is not nervous/anxious and is not hyperactive.     Medications: I have reviewed the patient's current medications.  Current Outpatient Medications  Medication Sig Dispense Refill  . acetaminophen (TYLENOL ARTHRITIS PAIN) 650 MG CR tablet Take 1,300 mg by mouth 3 (three) times daily.     . Acetylcysteine (NAC) 600 MG CAPS Take 600 mg by mouth 2 (two) times daily.    Marland Kitchen amLODipine (NORVASC) 5 MG tablet Take 5 mg at bedtime by mouth.  2  . Carboxymethylcellul-Glycerin (REFRESH OPTIVE OP) Apply to eye daily.    . celecoxib (CELEBREX) 200 MG capsule TAKE 1 CAPSULE BY MOUTH EVERY DAY 90 capsule 0  . Cholecalciferol (VITAMIN D) 2000 units CAPS Take 2,000 Units daily by mouth.    . cycloSPORINE (RESTASIS) 0.05 % ophthalmic emulsion Place 1 drop into both eyes 2 (two) times daily.    . DULoxetine (CYMBALTA) 60 MG capsule TAKE 1 CAPSULE BY MOUTH EVERY MORNING 90 capsule 0  . furosemide (LASIX) 20 MG tablet TAKE 1 TABLET BY MOUTH EVERY DAY    . gabapentin (NEURONTIN) 300 MG capsule 900 mg at bedtime.     Marland Kitchen levothyroxine (SYNTHROID, LEVOTHROID) 200 MCG tablet Take 200 mcg by mouth daily before breakfast.    . lisdexamfetamine (  VYVANSE) 60 MG capsule Take 1 capsule (60 mg total) by mouth every morning. 30 capsule 0  . lisdexamfetamine (VYVANSE) 60 MG capsule Take 1 capsule (60 mg total) by mouth every morning. 30 capsule 0  . lisdexamfetamine (VYVANSE) 60 MG capsule Take 1 capsule (60 mg total) by mouth every morning. 30 capsule 0  . lithium carbonate (LITHOBID) 300 MG CR tablet Take 300 mg by mouth See admin instructions. 300mg  in the AM and 600mg  (take an additional 150mg  capsule)at bedtime.  2  . lithium carbonate  150 MG capsule Take 1 capsule (150 mg total) by mouth at bedtime. Take with 600mg  lithium carbonate at bedtime to equal 750mg  90 capsule 0  . loratadine (CLARITIN) 10 MG tablet Take 10 mg by mouth daily as needed for allergies.     Marland Kitchen LORazepam (ATIVAN) 0.5 MG tablet Take 0.5 mg by mouth as needed.     . methocarbamol (ROBAXIN) 500 MG tablet TAKE 0.5 TO 1 TABLET UP TO 3 TIMES DAILY AS NEEDED FOR MUSCLE SPASMS. 90 tablet 2  . mirabegron ER (MYRBETRIQ) 50 MG TB24 tablet Take 50 mg at bedtime by mouth.     . Multiple Vitamins-Minerals (MULTIVITAMIN WITH MINERALS) tablet Take 1 tablet by mouth daily.    . Multiple Vitamins-Minerals (PRESERVISION AREDS 2) CAPS Take 1 capsule 2 (two) times daily by mouth.    . nystatin cream (MYCOSTATIN) Apply 1 application topically 2 (two) times daily as needed (rash).    . pravastatin (PRAVACHOL) 20 MG tablet Take 20 mg at bedtime by mouth.     Marland Kitchen HYDROcodone-acetaminophen (NORCO) 5-325 MG tablet Take 1 tablet by mouth every 6 (six) hours as needed for moderate pain. 30 tablet 0  . levothyroxine (SYNTHROID) 25 MCG tablet Synthroid 25 mcg tablet once daily    . Sodium Chloride, Hypertonic, (MURO 128 OP) Apply to eye daily.     No current facility-administered medications for this visit.     Medication Side Effects: Other: ? sleepiness unlikely related.  Allergies:  Allergies  Allergen Reactions  . Ibuprofen Diarrhea, Nausea Only and Other (See Comments)    Extreme stomach pain  Makes her feel bad  . Codeine     UNSPECIFIED REACTION   . Nabumetone     UNSPECIFIED REACTION   . Promethazine     UNSPECIFIED REACTION   . Amoxicillin-Pot Clavulanate Nausea And Vomiting  . Erythromycin Base Diarrhea  . Promethazine-Codeine Other (See Comments)    "Makes her feel bad"    Past Medical History:  Diagnosis Date  . Arthritis    lower back, hands, knees  . Bipolar disorder ([place])   . Depression   . Dyspnea    with exertion  . GERD (gastroesophageal reflux  disease)    patient unsure about this dx - no meds  . Headache   . History of IBS    watches diet  . Hyperlipidemia   . Hypertension   . Hypothyroidism   . Obese   . Plantar fasciitis   . Seasonal allergies   . Sleep apnea 2017   uses CPAP   . Spinal stenosis of lumbar region   . SVD (spontaneous vaginal delivery)    x 2  . Systemic lupus ([place])   . Thyroid disease     Family History  Problem Relation Age of Onset  . Thyroid disease Mother   . Breast cancer Mother   . Heart disease Mother   . Bipolar disorder Father   . Diabetes  Father   . Heart disease Father   . Dementia Father     Social History   Socioeconomic History  . Marital status: Married    Spouse name: Not on file  . Number of children: Not on file  . Years of education: Not on file  . Highest education level: Not on file  Occupational History  . Not on file  Social Needs  . Financial resource strain: Not on file  . Food insecurity:    Worry: Not on file    Inability: Not on file  . Transportation needs:    Medical: Not on file    Non-medical: Not on file  Tobacco Use  . Smoking status: Never Smoker  . Smokeless tobacco: Never Used  Substance and Sexual Activity  . Alcohol use: Yes    Comment: rarely  . Drug use: Never  . Sexual activity: Not on file  Lifestyle  . Physical activity:    Days per week: Not on file    Minutes per session: Not on file  . Stress: Not on file  Relationships  . Social connections:    Talks on phone: Not on file    Gets together: Not on file    Attends religious service: Not on file    Active member of club or organization: Not on file    Attends meetings of clubs or organizations: Not on file    Relationship status: Not on file  . Intimate partner violence:    Fear of current or ex partner: Not on file    Emotionally abused: Not on file    Physically abused: Not on file    Forced sexual activity: Not on file  Other Topics Concern  . Not on file  Social  History Narrative   She lives in a single level home with her husband.  She has two grown children.   She taught college accounting courses, retired in 2005.    Highest level of education:  Doctorate in accounting    Past Medical History, Surgical history, Social history, and Family history were reviewed and updated as appropriate.   5 gkids 15 to 7 mos.  Please see review of systems for further details on the patient's review from today.   Objective:   Physical Exam:  There were no vitals taken for this visit.  Physical Exam Neurological:     Mental Status: She is alert and oriented to person, place, and time.     Cranial Nerves: No dysarthria.  Psychiatric:        Attention and Perception: Attention normal. She does not perceive auditory hallucinations.        Mood and Affect: Mood is anxious. Mood is not elated. Affect is not angry or tearful.        Speech: Speech is slurred. Speech is not rapid and pressured.        Behavior: Behavior is cooperative.        Thought Content: Thought content normal. Thought content is not paranoid or delusional. Thought content does not include homicidal or suicidal ideation. Thought content does not include homicidal or suicidal plan.        Cognition and Memory: Cognition and memory normal.        Judgment: Judgment normal.     Comments: Insight fair. Tendency to irritability. Talkative.     Lab Review:     Component Value Date/Time   NA 135 05/15/2018 1118   K 4.9 05/15/2018 1118  CL 104 05/15/2018 1118   CO2 27 05/15/2018 1118   GLUCOSE 99 05/15/2018 1118   BUN 21 05/15/2018 1118   CREATININE 0.90 05/15/2018 1118   CALCIUM 10.6 (H) 05/15/2018 1118   PROT 6.5 05/15/2018 1118   ALBUMIN 4.2 12/27/2016 1538   AST 20 05/15/2018 1118   ALT 17 05/15/2018 1118   ALKPHOS 85 12/27/2016 1538   BILITOT 0.3 05/15/2018 1118   GFRNONAA 66 05/15/2018 1118   GFRAA 76 05/15/2018 1118       Component Value Date/Time   WBC 11.4 (H)  05/15/2018 1118   RBC 4.08 05/15/2018 1118   HGB 12.4 05/15/2018 1118   HCT 38.4 05/15/2018 1118   PLT 354 05/15/2018 1118   MCV 94.1 05/15/2018 1118   MCH 30.4 05/15/2018 1118   MCHC 32.3 05/15/2018 1118   RDW 13.0 05/15/2018 1118   LYMPHSABS 1,562 05/15/2018 1118   MONOABS 1,210 (H) 12/27/2016 1538   EOSABS 433 05/15/2018 1118   BASOSABS 91 05/15/2018 1118    Lithium Lvl  Date Value Ref Range Status  01/27/2018 1.0 0.6 - 1.2 mmol/L Final    Lithium 1.0 on 04/06/18 and other labs that date.   No results found for: PHENYTOIN, PHENOBARB, VALPROATE, CBMZ   .res Assessment: Plan:    Bipolar I disorder ([place])  Attention deficit hyperactivity disorder (ADHD), predominantly inattentive type  Obstructive sleep apnea  Generalized anxiety disorder  Very severe.  Mild Cognitive Impairment stable.  Emphasized the importance of CPAP as she struggels with the treatment.  Talked about the effect of OSA on the brain.    Lithium level is good.  BMP stable except watch hypercalcemia.  She has not done adequately well with alternative mood stabilizers.  Disc lab tests.  Disc need to monitor hypercalcemia.  Counseled patient regarding potential benefits, risks, and side effects of lithium to include potential risk of lithium affecting thyroid and renal function.  Discussed need for periodic lab monitoring to determine drug level and to assess for potential adverse effects.  Counseled patient regarding signs and symptoms of lithium toxicity and advised that they notify office immediately or seek urgent medical attention if experiencing these signs and symptoms.  Patient advised to contact office with any questions or concerns.  Answered her questions about the bipolar diagnoses.  Labs good.  Will not repeat until Covid dies out.  She is having no current symptoms of lithium toxicity.  Disc stress of D in law and chronic health problems and loss of driving independence. And Covid. And lack of  mobility.    Discussed potential benefits, risks, and side effects of stimulants with patient to include increased heart rate, palpitations, insomnia, increased anxiety, increased irritability, or decreased appetite.  Instructed patient to contact office if experiencing any significant tolerability issues.  Unlikely that further med changes would help in view of her severe obesity and severe OSA.  She thinks some of the sleepiness is the gabapentin which helps her pelvic pain.  This appt was 35 mins.   FU 3 mos  I connected with patient by a video enabled telemedicine application, with their informed consent, and verified patient privacy and that I am speaking with the correct person using two identifiers.  I was located at office and patient at home.  Lynder Parents, MD, DFAPA   Please see After Visit Summary for patient specific instructions.  Future Appointments  Date Time Provider [place]  05/22/2019  1:15 PM Bo Merino, MD [facility] None  No orders of the defined types were placed in this encounter.     -------------------------------

## 2018-08-23 ENCOUNTER — Other Ambulatory Visit: Payer: Self-pay | Admitting: Rheumatology

## 2018-08-23 NOTE — Telephone Encounter (Signed)
Last Visit:05/15/18 Next Visit:05/22/19 Labs: 05/15/18 Labs are stable. Calcium is elevated.   Okay to refillper Dr. Estanislado Pandy

## 2018-08-25 DIAGNOSIS — J45909 Unspecified asthma, uncomplicated: Secondary | ICD-10-CM | POA: Diagnosis not present

## 2018-08-25 DIAGNOSIS — M545 Low back pain: Secondary | ICD-10-CM | POA: Diagnosis not present

## 2018-08-25 DIAGNOSIS — M79671 Pain in right foot: Secondary | ICD-10-CM | POA: Diagnosis not present

## 2018-08-25 DIAGNOSIS — J309 Allergic rhinitis, unspecified: Secondary | ICD-10-CM | POA: Diagnosis not present

## 2018-08-28 ENCOUNTER — Telehealth: Payer: Self-pay | Admitting: Neurology

## 2018-08-28 NOTE — Telephone Encounter (Signed)
Patient called regarding her having an appointment at [facility] on Wednesday 08/30/18 and she is needing her EMG results faxed to that office. She could not remember the Doctors name that she is seeing. Thanks

## 2018-08-29 NOTE — Telephone Encounter (Signed)
Results faxed.

## 2018-09-01 ENCOUNTER — Telehealth: Payer: Self-pay | Admitting: Neurology

## 2018-09-01 DIAGNOSIS — G5791 Unspecified mononeuropathy of right lower limb: Secondary | ICD-10-CM | POA: Diagnosis not present

## 2018-09-01 DIAGNOSIS — M67372 Transient synovitis, left ankle and foot: Secondary | ICD-10-CM | POA: Diagnosis not present

## 2018-09-01 DIAGNOSIS — M19071 Primary osteoarthritis, right ankle and foot: Secondary | ICD-10-CM | POA: Diagnosis not present

## 2018-09-01 DIAGNOSIS — M19072 Primary osteoarthritis, left ankle and foot: Secondary | ICD-10-CM | POA: Diagnosis not present

## 2018-09-01 DIAGNOSIS — M67371 Transient synovitis, right ankle and foot: Secondary | ICD-10-CM | POA: Diagnosis not present

## 2018-09-01 NOTE — Telephone Encounter (Signed)
Nerve study faxed.

## 2018-09-01 NOTE — Telephone Encounter (Signed)
Patient called back needing to see if you can fax her Nerve Study  to Dr. Lindley [facility] [phone]. Thank you

## 2018-09-02 ENCOUNTER — Other Ambulatory Visit: Payer: Self-pay | Admitting: Rheumatology

## 2018-09-04 ENCOUNTER — Telehealth: Payer: Self-pay | Admitting: Rheumatology

## 2018-09-04 NOTE — Telephone Encounter (Signed)
Last Visit: 05/15/2018 Next Visit: 05/22/2019  Okay to refill per Dr.Deveshwar.

## 2018-09-04 NOTE — Telephone Encounter (Signed)
Opened in error

## 2018-09-07 DIAGNOSIS — G5791 Unspecified mononeuropathy of right lower limb: Secondary | ICD-10-CM | POA: Diagnosis not present

## 2018-09-07 DIAGNOSIS — M67372 Transient synovitis, left ankle and foot: Secondary | ICD-10-CM | POA: Diagnosis not present

## 2018-09-07 DIAGNOSIS — M67371 Transient synovitis, right ankle and foot: Secondary | ICD-10-CM | POA: Diagnosis not present

## 2018-09-11 DIAGNOSIS — J45909 Unspecified asthma, uncomplicated: Secondary | ICD-10-CM | POA: Diagnosis not present

## 2018-09-11 DIAGNOSIS — R195 Other fecal abnormalities: Secondary | ICD-10-CM | POA: Diagnosis not present

## 2018-09-13 DIAGNOSIS — M67372 Transient synovitis, left ankle and foot: Secondary | ICD-10-CM | POA: Diagnosis not present

## 2018-09-13 DIAGNOSIS — M67371 Transient synovitis, right ankle and foot: Secondary | ICD-10-CM | POA: Diagnosis not present

## 2018-09-28 DIAGNOSIS — M67372 Transient synovitis, left ankle and foot: Secondary | ICD-10-CM | POA: Diagnosis not present

## 2018-09-28 DIAGNOSIS — M67371 Transient synovitis, right ankle and foot: Secondary | ICD-10-CM | POA: Diagnosis not present

## 2018-09-28 DIAGNOSIS — B351 Tinea unguium: Secondary | ICD-10-CM | POA: Diagnosis not present

## 2018-10-05 DIAGNOSIS — G603 Idiopathic progressive neuropathy: Secondary | ICD-10-CM | POA: Diagnosis not present

## 2018-10-05 DIAGNOSIS — M5412 Radiculopathy, cervical region: Secondary | ICD-10-CM | POA: Diagnosis not present

## 2018-10-05 DIAGNOSIS — M5417 Radiculopathy, lumbosacral region: Secondary | ICD-10-CM | POA: Diagnosis not present

## 2018-10-05 DIAGNOSIS — G609 Hereditary and idiopathic neuropathy, unspecified: Secondary | ICD-10-CM | POA: Diagnosis not present

## 2018-10-05 DIAGNOSIS — R202 Paresthesia of skin: Secondary | ICD-10-CM | POA: Diagnosis not present

## 2018-10-05 DIAGNOSIS — R51 Headache: Secondary | ICD-10-CM | POA: Diagnosis not present

## 2018-10-05 DIAGNOSIS — R634 Abnormal weight loss: Secondary | ICD-10-CM | POA: Diagnosis not present

## 2018-10-05 DIAGNOSIS — R5383 Other fatigue: Secondary | ICD-10-CM | POA: Diagnosis not present

## 2018-10-05 DIAGNOSIS — G5601 Carpal tunnel syndrome, right upper limb: Secondary | ICD-10-CM | POA: Diagnosis not present

## 2018-10-05 DIAGNOSIS — G2581 Restless legs syndrome: Secondary | ICD-10-CM | POA: Diagnosis not present

## 2018-10-12 ENCOUNTER — Telehealth: Payer: Self-pay | Admitting: Psychiatry

## 2018-10-12 NOTE — Telephone Encounter (Signed)
Jessica [name] wishes to ask about whether she needs to come in to see you sooner. Had an incident with her daughter in law when visiting them and due to this daughter in law doesn't want her at her house anymore. It is very upsetting to her and not sure if she should come talk to you or take some time .

## 2018-10-12 NOTE — Telephone Encounter (Signed)
Unfortunately because we are shorthanded with medical providers at this time and I have a vacation next month I probably will not be able to see her soon enough to be helpful.  The problem with her daughter-in-law has been an ongoing situation that has unfortunately gotten worse.  I would recommend that she start counseling again as that will be more helpful than any medication changes that I might make.  I am unable to provide regular counseling.  Recommend that she schedule an appointment with a therapist.  If she wants to see a previous therapist that is fine but otherwise I would recommend Rinal[facility] Cloud, [place].  I think that would be a very good fit for her.  Please give her this information.  Regarding her other question about her doctor switching her from gabapentin to Lyrica.  That is certainly reasonable and there is no problem with it.

## 2018-10-12 NOTE — Telephone Encounter (Signed)
Rosy had a visit with her neurologist and he wants her to stop gabapentin and start Lyrica ( gen). It is for neuropathy.  Is this ok to take?

## 2018-10-13 DIAGNOSIS — M67372 Transient synovitis, left ankle and foot: Secondary | ICD-10-CM | POA: Diagnosis not present

## 2018-10-13 DIAGNOSIS — G5791 Unspecified mononeuropathy of right lower limb: Secondary | ICD-10-CM | POA: Diagnosis not present

## 2018-10-13 DIAGNOSIS — M67371 Transient synovitis, right ankle and foot: Secondary | ICD-10-CM | POA: Diagnosis not present

## 2018-10-13 NOTE — Telephone Encounter (Signed)
Message relayed that Dr. Clovis Pu is ok with the Lyrica.

## 2018-10-13 NOTE — Telephone Encounter (Signed)
Info provided to pt. She will consider counseling and call us back.

## 2018-10-16 ENCOUNTER — Other Ambulatory Visit: Payer: Self-pay

## 2018-10-16 MED ORDER — LITHIUM CARBONATE ER 300 MG PO TBCR: EXTENDED_RELEASE_TABLET | ORAL | 1 refills | Status: DC

## 2018-10-19 DIAGNOSIS — G603 Idiopathic progressive neuropathy: Secondary | ICD-10-CM | POA: Diagnosis not present

## 2018-10-19 DIAGNOSIS — R51 Headache: Secondary | ICD-10-CM | POA: Diagnosis not present

## 2018-10-19 DIAGNOSIS — G2581 Restless legs syndrome: Secondary | ICD-10-CM | POA: Diagnosis not present

## 2018-10-19 DIAGNOSIS — M545 Low back pain: Secondary | ICD-10-CM | POA: Diagnosis not present

## 2018-10-21 ENCOUNTER — Other Ambulatory Visit: Payer: Self-pay | Admitting: Psychiatry

## 2018-10-31 DIAGNOSIS — N816 Rectocele: Secondary | ICD-10-CM | POA: Diagnosis not present

## 2018-10-31 DIAGNOSIS — N3941 Urge incontinence: Secondary | ICD-10-CM | POA: Diagnosis not present

## 2018-10-31 DIAGNOSIS — N3281 Overactive bladder: Secondary | ICD-10-CM | POA: Diagnosis not present

## 2018-11-01 ENCOUNTER — Other Ambulatory Visit: Payer: Self-pay | Admitting: Psychiatry

## 2018-11-14 ENCOUNTER — Other Ambulatory Visit: Payer: Self-pay | Admitting: Rheumatology

## 2018-11-14 DIAGNOSIS — M67371 Transient synovitis, right ankle and foot: Secondary | ICD-10-CM | POA: Diagnosis not present

## 2018-11-14 DIAGNOSIS — M67372 Transient synovitis, left ankle and foot: Secondary | ICD-10-CM | POA: Diagnosis not present

## 2018-11-14 DIAGNOSIS — W57XXXA Bitten or stung by nonvenomous insect and other nonvenomous arthropods, initial encounter: Secondary | ICD-10-CM | POA: Diagnosis not present

## 2018-11-14 DIAGNOSIS — S80862A Insect bite (nonvenomous), left lower leg, initial encounter: Secondary | ICD-10-CM | POA: Diagnosis not present

## 2018-11-14 DIAGNOSIS — L0390 Cellulitis, unspecified: Secondary | ICD-10-CM | POA: Diagnosis not present

## 2018-11-14 DIAGNOSIS — Z6841 Body Mass Index (BMI) 40.0 and over, adult: Secondary | ICD-10-CM | POA: Diagnosis not present

## 2018-11-14 NOTE — Telephone Encounter (Addendum)
Last Visit: 05/15/2018 Next Visit: 05/22/2019 Labs: 05/15/18 Labs are stable. Calcium is elevated.  Left message to advise patient she is due to update labs.   Okay to refill 30 day supply per Dr. Estanislado Pandy

## 2018-11-15 ENCOUNTER — Encounter: Payer: Self-pay | Admitting: Physical Therapy

## 2018-11-15 ENCOUNTER — Ambulatory Visit: Payer: Medicare Other | Attending: Family Medicine | Admitting: Physical Therapy

## 2018-11-15 ENCOUNTER — Other Ambulatory Visit: Payer: Self-pay

## 2018-11-15 DIAGNOSIS — M6281 Muscle weakness (generalized): Secondary | ICD-10-CM | POA: Diagnosis not present

## 2018-11-15 DIAGNOSIS — M5416 Radiculopathy, lumbar region: Secondary | ICD-10-CM | POA: Insufficient documentation

## 2018-11-15 DIAGNOSIS — M62830 Muscle spasm of back: Secondary | ICD-10-CM | POA: Diagnosis not present

## 2018-11-15 DIAGNOSIS — R262 Difficulty in walking, not elsewhere classified: Secondary | ICD-10-CM | POA: Diagnosis not present

## 2018-11-15 DIAGNOSIS — G8929 Other chronic pain: Secondary | ICD-10-CM

## 2018-11-15 DIAGNOSIS — M545 Low back pain: Secondary | ICD-10-CM | POA: Insufficient documentation

## 2018-11-16 ENCOUNTER — Other Ambulatory Visit: Payer: Self-pay | Admitting: Family Medicine

## 2018-11-16 ENCOUNTER — Ambulatory Visit
Admission: RE | Admit: 2018-11-16 | Discharge: 2018-11-16 | Disposition: A | Discharge status: Home / Self Care | Payer: Medicare Other | Source: Ambulatory Visit | Attending: Family Medicine | Admitting: Family Medicine

## 2018-11-16 DIAGNOSIS — R0902 Hypoxemia: Secondary | ICD-10-CM

## 2018-11-16 DIAGNOSIS — R0600 Dyspnea, unspecified: Secondary | ICD-10-CM | POA: Diagnosis not present

## 2018-11-16 DIAGNOSIS — W57XXXA Bitten or stung by nonvenomous insect and other nonvenomous arthropods, initial encounter: Secondary | ICD-10-CM | POA: Diagnosis not present

## 2018-11-16 DIAGNOSIS — S80862D Insect bite (nonvenomous), left lower leg, subsequent encounter: Secondary | ICD-10-CM | POA: Diagnosis not present

## 2018-11-16 DIAGNOSIS — R5383 Other fatigue: Secondary | ICD-10-CM | POA: Diagnosis not present

## 2018-11-16 NOTE — Therapy (Signed)
[place], [place], [address] Phone: [phone]   Fax:  [phone]  Physical Therapy Evaluation  Patient Details  Name: Jessica [name] MRN: [id number] Date of Birth: 1951-01-03 Referring Provider (PT): Donnie Coffin MD    Encounter Date: 11/15/2018  PT End of Session - 11/16/18 1247    Visit Number  1    Number of Visits  6    Date for PT Re-Evaluation  12/28/18    Authorization Type  medicare progress note after 10 visits    PT Start Time  1304    PT Stop Time  1340    PT Time Calculation (min)  36 min    Activity Tolerance  Patient tolerated treatment well    Behavior During Therapy  [facility] for tasks assessed/performed       Past Medical History:  Diagnosis Date  . Arthritis    lower back, hands, knees  . Bipolar disorder ([place])   . Depression   . Dyspnea    with exertion  . GERD (gastroesophageal reflux disease)    patient unsure about this dx - no meds  . Headache   . History of IBS    watches diet  . Hyperlipidemia   . Hypertension   . Hypothyroidism   . Obese   . Plantar fasciitis   . Seasonal allergies   . Sleep apnea 2017   uses CPAP   . Spinal stenosis of lumbar region   . SVD (spontaneous vaginal delivery)    x 2  . Systemic lupus ([place])   . Thyroid disease     Past Surgical History:  Procedure Laterality Date  . CARPAL TUNNEL RELEASE Left 09/16/2017   Procedure: LEFT CARPAL TUNNEL RELEASE;  Surgeon: Daryll Brod, MD;  Location: [place];  Service: Orthopedics;  Laterality: Left;  . carpel tunnel surgery Right   . CATARACT EXTRACTION Bilateral 2007   w/ lens implants  . COLONOSCOPY    . DILATION AND CURETTAGE OF UTERUS    . EYE SURGERY    . FOOT SURGERY Right    hammer toe  . FRACTURE SURGERY     R tibia and fibula  . LEG SURGERY Right   . TONSILLECTOMY    . WISDOM TOOTH EXTRACTION      There were no vitals filed for this visit.   Subjective Assessment - 11/15/18  1317    Subjective  Patient has had a progressive loss of mobility over time. She has pain in her back; feet, hips, and lower back. She has had two periods of time were she has been ill and she was unable to do sactivity. Since that point she has had increasing weakness and devbility. Walking from the lobby she had dyspnesa reaching an 8/10 on the dyspnea scale.    Limitations  Sitting;Lifting;Standing;Walking;House hold activities    How long can you sit comfortably?  any prolonged poistioning causes pain    How long can you stand comfortably?  3-4 minutes before she has too much discomfort    How long can you walk comfortably?  sugnificant dyspnea coming from the lobby to a treatment room    Currently in Pain?  Yes    Pain Score  7    with activity   Pain Location  Back    Pain Orientation  Lower    Pain Descriptors / Indicators  Aching    Pain Type  Chronic pain    Pain Onset  More than a month ago    Pain Frequency  Constant    Aggravating Factors   movement, standing and walking    Pain Relieving Factors  changing positions    Multiple Pain Sites  No         [facility] PT Assessment - 11/16/18 0001      Assessment   Medical Diagnosis  Low Back and foot Pain/ Severe deconditioning     Referring Provider (PT)  Donnie Coffin MD     Hand Dominance  Right    Next MD Visit  semi anual appointments    Prior Therapy  Has had therapy for her back and knees in th past. In general she has only came for a visit or 2.       Precautions   Precautions  None      Restrictions   Weight Bearing Restrictions  No      Balance Screen   Has the patient fallen in the past 6 months  No    Has the patient had a decrease in activity level because of a fear of falling?   No    Is the patient reluctant to leave their home because of a fear of falling?   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    Leisure  used to be able to walk       Cognition   Overall Cognitive Status  Within Functional Limits for tasks assessed    Attention  Focused      Observation/Other Assessments   Focus on Therapeutic Outcomes (FOTO)   not perfromed 2nd to time and unclear what we were treating       Sensation   Light Touch  Appears Intact      Coordination   Gross Motor Movements are Fluid and Coordinated  Yes    Fine Motor Movements are Fluid and Coordinated  Yes      Functional Tests   Functional tests  --   unable to perfrom 2nd to severe dyspnea      ROM / Strength   AROM / PROM / Strength  AROM;PROM;Strength      AROM   Overall AROM Comments  unable to get into supine but very little hip flexion in standing       PROM   Overall PROM Comments  unable to position patient for PROM      Strength   Strength Assessment Site  Knee;Hip;Ankle    Right/Left Hip  Right;Left    Right Hip Flexion  3/5    Right Hip ABduction  3/5    Right Hip ADduction  3+/5    Left Hip Flexion  3/5    Left Hip ABduction  3+/5    Left Hip ADduction  3+/5    Right/Left Knee  Right;Left    Right Knee Flexion  3+/5    Right Knee Extension  3+/5    Left Knee Flexion  3+/5    Left Knee Extension  3+/5    Right/Left Ankle  Right;Left    Right Ankle Dorsiflexion  3+/5    Left Ankle Dorsiflexion  3+/5      Palpation   Palpation comment  difficult to assess 2nd to large amounts of adipose tissue       Special Tests  Other special tests  unable to perfrom       Bed Mobility   Bed Mobility  --   can not lie back      Transfers   Transfers  Sit to Stand    Comments  slow and unsteady sit to stand transfer. Min gaurd for safety       Ambulation/Gait   Gait Comments  severe dyspnea after ambualting 50'                 Objective measurements completed on examination: See above findings.              PT Education - 11/15/18 1328     Education Details  reviewed HEP and symptom mangement    Person(s) Educated  Patient    Methods  Explanation;Demonstration;Tactile cues;Verbal cues    Comprehension  Returned demonstration;Verbalized understanding;Verbal cues required;Tactile cues required;Need further instruction       PT Short Term Goals - 11/16/18 1303      PT SHORT TERM GOAL #1   Title  Patient will be indepdnent with very basic HEP    Time  3    Period  Weeks    Status  New    Target Date  12/07/18      PT SHORT TERM GOAL #2   Title  Patient will ambualte 100' without severe dyspnea    Baseline  patient understands HEP thus far, needs more consistency     Time  3    Period  Weeks    Status  New    Target Date  12/07/18        PT Long Term Goals - 11/16/18 1304      PT LONG TERM GOAL #1   Title  Patient will transfer sit to stand witout severe dyspnea and without gaurding    Time  6    Period  Weeks    Status  New    Target Date  12/28/18      PT LONG TERM GOAL #2   Title  Patient will increase gross bilateral hip flexor strength to 3/5    Time  6    Period  Weeks    Status  New    Target Date  12/28/18      PT LONG TERM GOAL #3   Title  Patient will ambualte 300' without severe dypnea    Time  6    Period  Weeks    Status  New    Target Date  12/07/18             Plan - 11/16/18 1249    Clinical Impression Statement  Patient is a 68 year old female who presents with significant limitations in mobility. She had difficulty walking from the lobby to the treatment room. She had severe dyspnea with ambulation. She had dyspne even with manual musle testing. She has swelling in both legs L>R. Therapy hoped to perfrom TUG, 84M walk test and Sit to stand test, but was unable to perfrom even simple tasks today. Therapy checked patients blood pressure and it was found to be 206/115. It was rechecked with manual B/P cuff and was the same. She was educated on the dangers of hypertension and was  advised to call her MD. She was given light exercises to work on but strongly advised not to perfrom until she had her blood pressure checked. She would benefit from skilled therapy to improve her overall strength and mobility.  She has been to therapy several times in the past but at the most has only come for 2-3 visits. She was advised if she wants to see the benefits she is going to have to be more consitent.    Personal Factors and Comorbidities  Comorbidity 1;Comorbidity 2;Comorbidity 3+    Comorbidities  HTN, OA, plantar facitis, dyspnea, bi-polar    Examination-Activity Limitations  Stand;Stairs;Squat;Sit;Continence;Dressing;Hygiene/Grooming;Lift;Locomotion Level;Bed Mobility;Bend    Examination-Participation Restrictions  Meal Prep;Cleaning;Community Activity;Driving;Shop;Laundry;Yard Work    Stability/Clinical Decision Making  Unstable/Unpredictable   severe pain and severe debility   Clinical Decision Making  High   multiple co-morbidities and behavioral barriers   Rehab Potential  Poor   severe debility and poor compliance int he past   PT Frequency  1x / week    PT Duration  6 weeks    PT Treatment/Interventions  ADLs/Self Care Home Management;Cryotherapy;Electrical Stimulation;Iontophoresis 4mg /ml Dexamethasone;Gait training;Moist Heat;DME Instruction;Stair training;Functional mobility training;Therapeutic activities;Therapeutic exercise;Neuromuscular re-education;Manual techniques;Patient/family education;Passive range of motion;Taping    PT Next Visit Plan  light exercises of any kind? ambualtion? functional tasting if she can?    PT Home Exercise Plan  seated bilateral ER seated low range horizontal abduction ankle pumps, low range LAQ. Patient advised noty to perfrom until she sees MD. Patient just shown how to do the exercises    Consulted and Agree with Plan of Care  Patient       Patient will benefit from skilled therapeutic intervention in order to improve the following  deficits and impairments:  Abnormal gait, Decreased range of motion, Increased fascial restricitons, Increased muscle spasms, Impaired UE functional use, Decreased knowledge of use of DME, Decreased mobility, Decreased strength, Improper body mechanics, Pain, Impaired perceived functional ability, Decreased safety awareness, Decreased endurance  Visit Diagnosis: 1. Chronic bilateral low back pain without sciatica   2. Muscle spasm of back   3. Difficulty in walking, not elsewhere classified   4. Muscle weakness (generalized)   5. Radiculopathy, lumbar region        Problem List Patient Active Problem List   Diagnosis Date Noted  . Primary osteoarthritis of both hands 04/19/2017  . Primary osteoarthritis of both knees 04/19/2017  . Primary osteoarthritis of both feet 04/19/2017  . DDD (degenerative disc disease), lumbar 04/19/2017  . History of bipolar disorder 04/19/2017  . Hypothyroidism 06/04/2014  . Chest pain 01/23/2013  . ARF (acute renal failure) ([place]) 01/23/2013  . HTN (hypertension) 01/23/2013  . Leukocytosis 01/23/2013  . HLD (hyperlipidemia) 01/23/2013    Carney Living PT DPT  11/16/2018, 1:13 PM  [facility] [address] [place], [place], [address] Phone: [phone]   Fax:  [phone]  Name: GRACEE RATTERREE MRN: [id number] Date of Birth: December 13, 1950

## 2018-11-17 DIAGNOSIS — L304 Erythema intertrigo: Secondary | ICD-10-CM | POA: Diagnosis not present

## 2018-11-17 DIAGNOSIS — L820 Inflamed seborrheic keratosis: Secondary | ICD-10-CM | POA: Diagnosis not present

## 2018-11-17 DIAGNOSIS — X32XXXD Exposure to sunlight, subsequent encounter: Secondary | ICD-10-CM | POA: Diagnosis not present

## 2018-11-17 DIAGNOSIS — D225 Melanocytic nevi of trunk: Secondary | ICD-10-CM | POA: Diagnosis not present

## 2018-11-17 DIAGNOSIS — L570 Actinic keratosis: Secondary | ICD-10-CM | POA: Diagnosis not present

## 2018-11-22 ENCOUNTER — Other Ambulatory Visit: Payer: Self-pay

## 2018-11-22 ENCOUNTER — Ambulatory Visit (INDEPENDENT_AMBULATORY_CARE_PROVIDER_SITE_OTHER): Payer: Medicare Other | Admitting: Pulmonary Disease

## 2018-11-22 VITALS — BP 128/82 | HR 105 | Temp 97.8°F | Ht 62.0 in | Wt 321.6 lb

## 2018-11-22 DIAGNOSIS — G4733 Obstructive sleep apnea (adult) (pediatric): Secondary | ICD-10-CM

## 2018-11-22 DIAGNOSIS — R0602 Shortness of breath: Secondary | ICD-10-CM

## 2018-11-22 DIAGNOSIS — J9611 Chronic respiratory failure with hypoxia: Secondary | ICD-10-CM | POA: Diagnosis not present

## 2018-11-22 DIAGNOSIS — Z9989 Dependence on other enabling machines and devices: Secondary | ICD-10-CM | POA: Diagnosis not present

## 2018-11-22 MED ORDER — ALBUTEROL SULFATE HFA 108 (90 BASE) MCG/ACT IN AERS: 2.0000 | INHALATION_SPRAY | Freq: Four times a day (QID) | RESPIRATORY_TRACT | 4 refills | Status: DC | PRN

## 2018-11-22 NOTE — Progress Notes (Signed)
Subjective:   PATIENT ID: Jessica [name] GENDER: female DOB: 08/21/1950, MRN: [id number]   HPI  Chief Complaint  Patient presents with   Pulm Consult    Referred by PCP Dr. Alroy Dust for SOB. Patient states that a small amount of exertion causes her to becomes SOB.     Reason for Visit: New consult for shortness of breath  [name] is a 68 year old female never smoker with OSA on CPAP, spinal stenosis, lupus who presents for evaluation of shortness of breath.  She was seen by the physical therapist and was noted to be gasping while using a walker on entrance. She was evaluated by her family physician and found to be hypoxemic and prescribed supplemental oxygen.   She has had gradually worsening shortness of breath for unknown period of time. She reports memory issues so she is unsure when her breathing issues began.She has difficulty getting into her minvan within the last few months. Getting in and out of the house has also been an issue as she has 4 steps to step into the house. Her husband had to build ramps to help with her decreased mobility. Dyspnea worsens on exertion. Rest improves symptoms. Denies chest pain, associated cough, reflux.   She is compliant with her CPAP, wearing it for most nights >4 hours. Denies fevers, chills. Reports 8-10lbs in the last 3 months since the pandemic. In the last four years, she believes she has gained 40 lbs in the last two years.  Last PS[facility] in 02/19/16 at [facility]. She is very sleepy and reports often waking up and still needing several naps during the day. She is on Vyvanse however still feels drowsy. She reports getting an average of 6-8 hours of "sleep" however she 2-3 hours intervals not sleeping it.  Social History: Never smoker  Environmental exposures: None  I have personally reviewed patient's past medical/family/social history, allergies, current medications.  Past Medical History:  Diagnosis Date   Arthritis      lower back, hands, knees   Bipolar disorder ([facility])    Depression    Dyspnea    with exertion   GERD (gastroesophageal reflux disease)    patient unsure about this dx - no meds   Headache    History of IBS    watches diet   Hyperlipidemia    Hypertension    Hypothyroidism    Obese    Plantar fasciitis    Seasonal allergies    Sleep apnea 2017   uses CPAP    Spinal stenosis of lumbar region    SVD (spontaneous vaginal delivery)    x 2   Systemic lupus ([place])    Thyroid disease      Family History  Problem Relation Age of Onset   Thyroid disease Mother    Breast cancer Mother    Heart disease Mother    Bipolar disorder Father    Diabetes Father    Heart disease Father    Dementia Father      Social History   Occupational History   Not on file  Tobacco Use   Smoking status: Never Smoker   Smokeless tobacco: Never Used  Substance and Sexual Activity   Alcohol use: Yes    Comment: rarely   Drug use: Never   Sexual activity: Not on file    Allergies  Allergen Reactions   Ibuprofen Diarrhea, Nausea Only and Other (See Comments)    Extreme stomach pain  Makes  her feel bad   Codeine     UNSPECIFIED REACTION    Nabumetone     UNSPECIFIED REACTION    Promethazine     UNSPECIFIED REACTION    Amoxicillin-Pot Clavulanate Nausea And Vomiting   Erythromycin Base Diarrhea   Promethazine-Codeine Other (See Comments)    "Makes her feel bad"     Outpatient Medications Prior to Visit  Medication Sig Dispense Refill   acetaminophen (TYLENOL ARTHRITIS PAIN) 650 MG CR tablet Take 1,300 mg by mouth 3 (three) times daily.      Acetylcysteine (NAC) 600 MG CAPS Take 600 mg by mouth 2 (two) times daily.     amLODipine (NORVASC) 5 MG tablet Take 5 mg at bedtime by mouth.  2   Carboxymethylcellul-Glycerin (REFRESH OPTIVE OP) Apply to eye daily.     celecoxib (CELEBREX) 200 MG capsule TAKE 1 CAPSULE BY MOUTH EVERY DAY 30 capsule 0    Cholecalciferol (VITAMIN D) 2000 units CAPS Take 2,000 Units daily by mouth.     cycloSPORINE (RESTASIS) 0.05 % ophthalmic emulsion Place 1 drop into both eyes 2 (two) times daily.     DULoxetine (CYMBALTA) 60 MG capsule TAKE 1 CAPSULE BY MOUTH EVERY DAY IN THE MORNING 90 capsule 0   furosemide (LASIX) 20 MG tablet TAKE 1 TABLET BY MOUTH EVERY DAY     gabapentin (NEURONTIN) 300 MG capsule 600 mg at bedtime.      HYDROcodone-acetaminophen (NORCO) 5-325 MG tablet Take 1 tablet by mouth every 6 (six) hours as needed for moderate pain. 30 tablet 0   levothyroxine (SYNTHROID) 25 MCG tablet Synthroid 25 mcg tablet once daily     levothyroxine (SYNTHROID, LEVOTHROID) 200 MCG tablet Take 200 mcg by mouth daily before breakfast.     lisdexamfetamine (VYVANSE) 60 MG capsule Take 1 capsule (60 mg total) by mouth every morning. 30 capsule 0   lithium carbonate (LITHOBID) 300 MG CR tablet 300mg  in the AM and 600mg  (take an additional 150mg  capsule)at bedtime. 270 tablet 1   lithium carbonate 150 MG capsule TAKE 1 CAPSULE BY MOUTH AT BEDTIME. TAKE WITH 600MG  LITHIUM CARBONATE AT BEDTIME TO EQUAL 750MG  90 capsule 0   loratadine (CLARITIN) 10 MG tablet Take 10 mg by mouth daily as needed for allergies.      LORazepam (ATIVAN) 0.5 MG tablet Take 0.5 mg by mouth as needed.      methocarbamol (ROBAXIN) 500 MG tablet TAKE 1/2 TO 1 TABLET BY MOUTH UP TO 3 TIMES DAILY AS NEEDED FOR MUSCLE SPASMS 90 tablet 2   mirabegron ER (MYRBETRIQ) 50 MG TB24 tablet Take 50 mg at bedtime by mouth.      Multiple Vitamins-Minerals (MULTIVITAMIN WITH MINERALS) tablet Take 1 tablet by mouth daily.     Multiple Vitamins-Minerals (PRESERVISION AREDS 2) CAPS Take 1 capsule 2 (two) times daily by mouth.     nystatin cream (MYCOSTATIN) Apply 1 application topically 2 (two) times daily as needed (rash).     pravastatin (PRAVACHOL) 20 MG tablet Take 20 mg at bedtime by mouth.      Sodium Chloride, Hypertonic, (MURO 128 OP)  Apply to eye daily.     lisdexamfetamine (VYVANSE) 60 MG capsule Take 1 capsule (60 mg total) by mouth every morning. 30 capsule 0   lisdexamfetamine (VYVANSE) 60 MG capsule Take 1 capsule (60 mg total) by mouth every morning. 30 capsule 0   No facility-administered medications prior to visit.     Review of Systems  Constitutional: Negative for chills,  diaphoresis, fever, malaise/fatigue and weight loss.  HENT: Negative for congestion, ear pain and sore throat.   Respiratory: Positive for shortness of breath and wheezing. Negative for cough, hemoptysis and sputum production.   Cardiovascular: Negative for chest pain (chest tightness), palpitations and leg swelling.  Gastrointestinal: Negative for abdominal pain, heartburn and nausea.  Genitourinary: Negative for frequency.  Musculoskeletal: Negative for joint pain and myalgias.  Skin: Negative for itching and rash.  Neurological: Negative for dizziness, weakness and headaches.  Endo/Heme/Allergies: Does not bruise/bleed easily.  Psychiatric/Behavioral: Negative for depression. The patient is not nervous/anxious.      Objective:   Vitals:   11/22/18 1542  BP: 128/82  Pulse: (!) 105  Temp: 97.8 F (36.6 C)  TempSrc: Oral  SpO2: 95%  Weight: (!) 321 lb 9.6 oz (145.9 kg)  Height: 5\' 2"  (1.575 m)   SpO2: 95 % O2 Device: Nasal cannula O2 Flow Rate (L/min): 4 L/min O2 Type: Continuous O2  Physical Exam: General: Obese, drowsy, slow speech, difficult recalling HENT: [place], AT Eyes: EOMI, injected conjunctiva Respiratory: Clear to auscultation bilaterally.  No crackles, wheezing or rales Cardiovascular: RRR, -M/R/G, no JVD GI: BS+, soft, nontender Extremities:2+LE edema,-tenderness Neuro: AAO x4, CNII-XII grossly intact Skin: Intact, no rashes or bruising Psych: Normal mood, normal affect  Data Reviewed:  Imaging: CXR 11/16/18 - No infiltrate, edema or effusion. Enlarged cardiac silhouette  PFT: None on  file  Labs: CBC    Component Value Date/Time   WBC 11.4 (H) 05/15/2018 1118   RBC 4.08 05/15/2018 1118   HGB 12.4 05/15/2018 1118   HCT 38.4 05/15/2018 1118   PLT 354 05/15/2018 1118   MCV 94.1 05/15/2018 1118   MCH 30.4 05/15/2018 1118   MCHC 32.3 05/15/2018 1118   RDW 13.0 05/15/2018 1118   LYMPHSABS 1,562 05/15/2018 1118   MONOABS 1,210 (H) 12/27/2016 1538   EOSABS 433 05/15/2018 1118   BASOSABS 91 05/15/2018 1118   CMP     Component Value Date/Time   NA 135 05/15/2018 1118   K 4.9 05/15/2018 1118   CL 104 05/15/2018 1118   CO2 27 05/15/2018 1118   GLUCOSE 99 05/15/2018 1118   BUN 21 05/15/2018 1118   CREATININE 0.90 05/15/2018 1118   CALCIUM 10.6 (H) 05/15/2018 1118   PROT 6.5 05/15/2018 1118   ALBUMIN 4.2 12/27/2016 1538   AST 20 05/15/2018 1118   ALT 17 05/15/2018 1118   ALKPHOS 85 12/27/2016 1538   BILITOT 0.3 05/15/2018 1118   GFRNONAA 66 05/15/2018 1118   GFRAA 76 05/15/2018 1118   Sleep Study: 02/2016 - AHI 133  Ambulator O2 11/22/18 Patient Saturations on Room Air at Rest = 88%  Patient Saturations on Room Air while Ambulating = 88%  Patient Saturations on 4 Liters of oxygen while Ambulating = 91%  Recommend 2L of O2 at rest, 4L of O2 with exertion  Imaging, labs and tests noted above have been reviewed independently by me.    Assessment & Plan:   Discussion: 69 year old female never smoker with OSA on CPAP, spinal stenosis, lupus who presents with shortness of breath. This is likely multifactorial with obesity, sleep apnea and deconditioning contributing. Will obtain pulmonary function tests to evaluate if patient would benefit from bronchodilator therapy. Will start SABA for now  Shortness of breath --Pulmonary function test to evaluate your lungs --Echocardiogram to assess your heart --We will send a message to your [facility] doctor to consider PAP titration study --We will prescribe  Albuterol to take as needed for shortness of  breath or wheezing  Chronic hypoxemic respiratory failure --AT REST, wear 2L oxygen --WITH ACTIVITY AND SLEEP, wear 4L oxygen  OSA on CPAP --Per Sleep Note - CPAP 15 cm --Will CC patient's sleep physician to consider PAP titration  Orders Placed This Encounter  Procedures   ECHOCARDIOGRAM COMPLETE    Standing Status:   Future    Standing Expiration Date:   02/22/2020    Order Specific Question:   Where should this test be performed    Answer:   [facility])    Order Specific Question:   Does the patient weigh less than or greater than 250 lbs?    Answer:   Patient weighs greater than 250 lbs    Order Specific Question:   Perflutren DEFINITY (image enhancing agent) should be administered unless hypersensitivity or allergy exist    Answer:   Administer Perflutren    Order Specific Question:   Reason for exam-Echo    Answer:   Other-Full Diagnosis List    Order Specific Question:   Full ICD-10/Reason for Exam    Answer:   SOB (shortness of breath) [166063]   Pulmonary function test    Standing Status:   Future    Standing Expiration Date:   11/22/2019    Order Specific Question:   Where should this test be performed?    Answer:    Pulmonary    Order Specific Question:   Full PFT: includes the following: basic spirometry, spirometry pre & post bronchodilator, diffusion capacity (DLCO), lung volumes    Answer:   Full PFT   Meds ordered this encounter  Medications   albuterol (VENTOLIN HFA) 108 (90 Base) MCG/ACT inhaler    Sig: Inhale 2 puffs into the lungs every 6 (six) hours as needed for wheezing or shortness of breath.    Dispense:  8 g    Refill:  4    Return in about 1 month (around 12/23/2018).  [place], MD [facility] 11/27/2018 8:17 PM  Office Number [phone]

## 2018-11-22 NOTE — Progress Notes (Signed)
   Subjective:    Patient ID: Jessica [name], female    DOB: Jul 02, 1950, 68 y.o.   MRN: [id number]  HPI    Review of Systems  Constitutional: Negative for fever and unexpected weight change.  HENT: Negative for congestion, dental problem, ear pain, nosebleeds, postnasal drip, rhinorrhea, sinus pressure, sneezing, sore throat and trouble swallowing.   Eyes: Negative for redness and itching.  Respiratory: Positive for chest tightness, shortness of breath and wheezing. Negative for cough.   Cardiovascular: Negative for palpitations and leg swelling.  Gastrointestinal: Negative for nausea and vomiting.  Genitourinary: Negative for dysuria.  Musculoskeletal: Negative for joint swelling.  Skin: Negative for rash.  Allergic/Immunologic: Negative.  Negative for environmental allergies, food allergies and immunocompromised state.  Neurological: Negative for headaches.  Hematological: Does not bruise/bleed easily.  Psychiatric/Behavioral: Negative for dysphoric mood. The patient is not nervous/anxious.        Objective:   Physical Exam        Assessment & Plan:

## 2018-11-22 NOTE — Patient Instructions (Addendum)
Shortness of breath --Pulmonary function test to evaluate your lungs --Echocardiogram to assess your heart --We will send a message to your [facility] Sleep doctor to consider PAP titration study --We will prescribe Albuterol to take as needed for shortness of breath or wheezing  Hypoxemic respiratory failure --AT REST, wear 2L oxygen --WITH ACTIVITY AND SLEEP, wear 4L oxygen

## 2018-11-27 ENCOUNTER — Encounter: Payer: Self-pay | Admitting: Pulmonary Disease

## 2018-11-27 DIAGNOSIS — M79671 Pain in right foot: Secondary | ICD-10-CM | POA: Diagnosis not present

## 2018-11-27 DIAGNOSIS — G4733 Obstructive sleep apnea (adult) (pediatric): Secondary | ICD-10-CM | POA: Insufficient documentation

## 2018-11-27 DIAGNOSIS — G609 Hereditary and idiopathic neuropathy, unspecified: Secondary | ICD-10-CM | POA: Diagnosis not present

## 2018-11-27 DIAGNOSIS — J9621 Acute and chronic respiratory failure with hypoxia: Secondary | ICD-10-CM | POA: Insufficient documentation

## 2018-11-27 DIAGNOSIS — J9611 Chronic respiratory failure with hypoxia: Secondary | ICD-10-CM | POA: Insufficient documentation

## 2018-11-27 DIAGNOSIS — M79672 Pain in left foot: Secondary | ICD-10-CM | POA: Diagnosis not present

## 2018-11-27 DIAGNOSIS — L03116 Cellulitis of left lower limb: Secondary | ICD-10-CM | POA: Diagnosis not present

## 2018-11-29 ENCOUNTER — Inpatient Hospital Stay (HOSPITAL_COMMUNITY)
Admission: EM | Admit: 2018-11-29 | Discharge: 2018-12-03 | DRG: 603 | Disposition: A | Discharge status: Home Health | Payer: Medicare Other | Attending: Internal Medicine | Admitting: Internal Medicine

## 2018-11-29 ENCOUNTER — Telehealth: Payer: Self-pay | Admitting: Psychiatry

## 2018-11-29 ENCOUNTER — Other Ambulatory Visit: Payer: Self-pay

## 2018-11-29 ENCOUNTER — Encounter (HOSPITAL_COMMUNITY): Payer: Self-pay | Admitting: Emergency Medicine

## 2018-11-29 DIAGNOSIS — Z6841 Body Mass Index (BMI) 40.0 and over, adult: Secondary | ICD-10-CM

## 2018-11-29 DIAGNOSIS — L03116 Cellulitis of left lower limb: Principal | ICD-10-CM | POA: Diagnosis present

## 2018-11-29 DIAGNOSIS — Y92007 Garden or yard of unspecified non-institutional (private) residence as the place of occurrence of the external cause: Secondary | ICD-10-CM

## 2018-11-29 DIAGNOSIS — Z79899 Other long term (current) drug therapy: Secondary | ICD-10-CM

## 2018-11-29 DIAGNOSIS — Z8249 Family history of ischemic heart disease and other diseases of the circulatory system: Secondary | ICD-10-CM

## 2018-11-29 DIAGNOSIS — M329 Systemic lupus erythematosus, unspecified: Secondary | ICD-10-CM | POA: Diagnosis present

## 2018-11-29 DIAGNOSIS — B379 Candidiasis, unspecified: Secondary | ICD-10-CM | POA: Diagnosis present

## 2018-11-29 DIAGNOSIS — J9611 Chronic respiratory failure with hypoxia: Secondary | ICD-10-CM | POA: Diagnosis present

## 2018-11-29 DIAGNOSIS — L02416 Cutaneous abscess of left lower limb: Secondary | ICD-10-CM

## 2018-11-29 DIAGNOSIS — Z79890 Hormone replacement therapy (postmenopausal): Secondary | ICD-10-CM

## 2018-11-29 DIAGNOSIS — R600 Localized edema: Secondary | ICD-10-CM | POA: Diagnosis present

## 2018-11-29 DIAGNOSIS — Z20828 Contact with and (suspected) exposure to other viral communicable diseases: Secondary | ICD-10-CM | POA: Diagnosis not present

## 2018-11-29 DIAGNOSIS — Z885 Allergy status to narcotic agent status: Secondary | ICD-10-CM

## 2018-11-29 DIAGNOSIS — I110 Hypertensive heart disease with heart failure: Secondary | ICD-10-CM | POA: Diagnosis present

## 2018-11-29 DIAGNOSIS — M1990 Unspecified osteoarthritis, unspecified site: Secondary | ICD-10-CM | POA: Diagnosis present

## 2018-11-29 DIAGNOSIS — F319 Bipolar disorder, unspecified: Secondary | ICD-10-CM | POA: Diagnosis present

## 2018-11-29 DIAGNOSIS — Z79891 Long term (current) use of opiate analgesic: Secondary | ICD-10-CM

## 2018-11-29 DIAGNOSIS — G4733 Obstructive sleep apnea (adult) (pediatric): Secondary | ICD-10-CM

## 2018-11-29 DIAGNOSIS — E662 Morbid (severe) obesity with alveolar hypoventilation: Secondary | ICD-10-CM | POA: Diagnosis present

## 2018-11-29 DIAGNOSIS — Z9841 Cataract extraction status, right eye: Secondary | ICD-10-CM

## 2018-11-29 DIAGNOSIS — S80262A Insect bite (nonvenomous), left knee, initial encounter: Secondary | ICD-10-CM | POA: Diagnosis present

## 2018-11-29 DIAGNOSIS — K219 Gastro-esophageal reflux disease without esophagitis: Secondary | ICD-10-CM | POA: Diagnosis present

## 2018-11-29 DIAGNOSIS — I5030 Unspecified diastolic (congestive) heart failure: Secondary | ICD-10-CM | POA: Diagnosis not present

## 2018-11-29 DIAGNOSIS — K589 Irritable bowel syndrome without diarrhea: Secondary | ICD-10-CM | POA: Diagnosis present

## 2018-11-29 DIAGNOSIS — Z818 Family history of other mental and behavioral disorders: Secondary | ICD-10-CM

## 2018-11-29 DIAGNOSIS — Z961 Presence of intraocular lens: Secondary | ICD-10-CM | POA: Diagnosis present

## 2018-11-29 DIAGNOSIS — J45909 Unspecified asthma, uncomplicated: Secondary | ICD-10-CM | POA: Diagnosis present

## 2018-11-29 DIAGNOSIS — J9621 Acute and chronic respiratory failure with hypoxia: Secondary | ICD-10-CM | POA: Diagnosis present

## 2018-11-29 DIAGNOSIS — Z9842 Cataract extraction status, left eye: Secondary | ICD-10-CM

## 2018-11-29 DIAGNOSIS — F900 Attention-deficit hyperactivity disorder, predominantly inattentive type: Secondary | ICD-10-CM

## 2018-11-29 DIAGNOSIS — Z91048 Other nonmedicinal substance allergy status: Secondary | ICD-10-CM

## 2018-11-29 DIAGNOSIS — E039 Hypothyroidism, unspecified: Secondary | ICD-10-CM | POA: Diagnosis present

## 2018-11-29 DIAGNOSIS — E785 Hyperlipidemia, unspecified: Secondary | ICD-10-CM | POA: Diagnosis present

## 2018-11-29 DIAGNOSIS — E079 Disorder of thyroid, unspecified: Secondary | ICD-10-CM | POA: Diagnosis present

## 2018-11-29 DIAGNOSIS — W57XXXA Bitten or stung by nonvenomous insect and other nonvenomous arthropods, initial encounter: Secondary | ICD-10-CM | POA: Diagnosis present

## 2018-11-29 DIAGNOSIS — Y93H2 Activity, gardening and landscaping: Secondary | ICD-10-CM

## 2018-11-29 DIAGNOSIS — L0390 Cellulitis, unspecified: Secondary | ICD-10-CM | POA: Diagnosis present

## 2018-11-29 DIAGNOSIS — I10 Essential (primary) hypertension: Secondary | ICD-10-CM | POA: Diagnosis present

## 2018-11-29 DIAGNOSIS — F419 Anxiety disorder, unspecified: Secondary | ICD-10-CM | POA: Diagnosis present

## 2018-11-29 DIAGNOSIS — Z888 Allergy status to other drugs, medicaments and biological substances status: Secondary | ICD-10-CM

## 2018-11-29 HISTORY — DX: Cutaneous abscess of left lower limb: L02.416

## 2018-11-29 LAB — CBC WITH DIFFERENTIAL/PLATELET
Abs Immature Granulocytes: 0.24 10*3/uL — ABNORMAL HIGH (ref 0.00–0.07)
Basophils Absolute: 0.1 10*3/uL (ref 0.0–0.1)
Basophils Relative: 1 %
Eosinophils Absolute: 0.2 10*3/uL (ref 0.0–0.5)
Eosinophils Relative: 1 %
HCT: 44.6 % (ref 36.0–46.0)
Hemoglobin: 13 g/dL (ref 12.0–15.0)
Immature Granulocytes: 2 %
Lymphocytes Relative: 7 %
Lymphs Abs: 1.1 10*3/uL (ref 0.7–4.0)
MCH: 31.3 pg (ref 26.0–34.0)
MCHC: 29.1 g/dL — ABNORMAL LOW (ref 30.0–36.0)
MCV: 107.5 fL — ABNORMAL HIGH (ref 80.0–100.0)
Monocytes Absolute: 1.3 10*3/uL — ABNORMAL HIGH (ref 0.1–1.0)
Monocytes Relative: 9 %
Neutro Abs: 12.1 10*3/uL — ABNORMAL HIGH (ref 1.7–7.7)
Neutrophils Relative %: 80 %
Platelets: 301 10*3/uL (ref 150–400)
RBC: 4.15 MIL/uL (ref 3.87–5.11)
RDW: 14.2 % (ref 11.5–15.5)
WBC: 15 10*3/uL — ABNORMAL HIGH (ref 4.0–10.5)
nRBC: 0 % (ref 0.0–0.2)

## 2018-11-29 LAB — LACTIC ACID, PLASMA: Lactic Acid, Venous: 0.8 mmol/L (ref 0.5–1.9)

## 2018-11-29 LAB — COMPREHENSIVE METABOLIC PANEL
ALT: 37 U/L (ref 0–44)
AST: 31 U/L (ref 15–41)
Albumin: 3.4 g/dL — ABNORMAL LOW (ref 3.5–5.0)
Alkaline Phosphatase: 145 U/L — ABNORMAL HIGH (ref 38–126)
Anion gap: 9 (ref 5–15)
BUN: 17 mg/dL (ref 8–23)
CO2: 24 mmol/L (ref 22–32)
Calcium: 10.6 mg/dL — ABNORMAL HIGH (ref 8.9–10.3)
Chloride: 103 mmol/L (ref 98–111)
Creatinine, Ser: 1.04 mg/dL — ABNORMAL HIGH (ref 0.44–1.00)
GFR calc Af Amer: >60 mL/min (ref >60)
GFR calc non Af Amer: 55 mL/min — ABNORMAL LOW (ref >60)
Glucose, Bld: 140 mg/dL — ABNORMAL HIGH (ref 70–99)
Potassium: 4.3 mmol/L (ref 3.5–5.1)
Sodium: 136 mmol/L (ref 135–145)
Total Bilirubin: 0.6 mg/dL (ref 0.3–1.2)
Total Protein: 6.6 g/dL (ref 6.5–8.1)

## 2018-11-29 MED ORDER — VANCOMYCIN HCL IN DEXTROSE 1-5 GM/200ML-% IV SOLN: 1000.0000 mg | Freq: Once | INTRAVENOUS | Status: DC

## 2018-11-29 MED ORDER — SODIUM CHLORIDE 0.9% FLUSH
3.0000 mL | Freq: Once | INTRAVENOUS | Status: AC
  Administered 2018-11-30: 3 mL via INTRAVENOUS

## 2018-11-29 MED ORDER — VANCOMYCIN HCL 10 G IV SOLR
2000.0000 mg | Freq: Once | INTRAVENOUS | Status: AC
  Administered 2018-11-30: 2000 mg via INTRAVENOUS
  Filled 2018-11-29: qty 2000

## 2018-11-29 MED ORDER — CLINDAMYCIN PHOSPHATE 600 MG/50ML IV SOLN
600.0000 mg | Freq: Three times a day (TID) | INTRAVENOUS | Status: DC
  Administered 2018-11-29: 600 mg via INTRAVENOUS
  Filled 2018-11-29: qty 50

## 2018-11-29 MED ORDER — LISDEXAMFETAMINE DIMESYLATE 60 MG PO CAPS: 60.0000 mg | ORAL_CAPSULE | ORAL | 0 refills | Status: DC

## 2018-11-29 NOTE — ED Provider Notes (Addendum)
[facility] EMERGENCY DEPARTMENT Provider Note   CSN: [id number] Arrival date & time: 11/29/18  1726    History   Chief Complaint Chief Complaint  Patient presents with  . Wound Infection    HPI Jessica [name] is a 68 y.o. female.     HPI For concern of left lower extremity infection.  Reports that 2 weeks ago she was in the garden and felt to stings to her anterior left lower leg.  Has had development of redness, worsening swelling on top of her chronic edema since then.  No constitutional symptoms of illness.  Saw provider 3 days ago who prescribed Keflex which she has been taking but redness has progressed.  Also states that she was placed on oxygen 10 days ago.  She is not sure what the diagnosis is.  Is on 4 L at baseline. Past Medical History:  Diagnosis Date  . Arthritis    lower back, hands, knees  . Bipolar disorder ([place])   . Depression   . Dyspnea    with exertion  . GERD (gastroesophageal reflux disease)    patient unsure about this dx - no meds  . Headache   . History of IBS    watches diet  . Hyperlipidemia   . Hypertension   . Hypothyroidism   . Obese   . Plantar fasciitis   . Seasonal allergies   . Sleep apnea 2017   uses CPAP   . Spinal stenosis of lumbar region   . SVD (spontaneous vaginal delivery)    x 2  . Systemic lupus ([place])   . Thyroid disease     Patient Active Problem List   Diagnosis Date Noted  . Cellulitis 11/29/2018  . OSA on CPAP 11/27/2018  . Chronic hypoxemic respiratory failure ([place]) 11/27/2018  . Primary osteoarthritis of both hands 04/19/2017  . Primary osteoarthritis of both knees 04/19/2017  . Primary osteoarthritis of both feet 04/19/2017  . DDD (degenerative disc disease), lumbar 04/19/2017  . History of bipolar disorder 04/19/2017  . Hypothyroidism 06/04/2014  . Chest pain 01/23/2013  . ARF (acute renal failure) ([place]) 01/23/2013  . HTN (hypertension) 01/23/2013  . Leukocytosis 01/23/2013  .  HLD (hyperlipidemia) 01/23/2013    Past Surgical History:  Procedure Laterality Date  . CARPAL TUNNEL RELEASE Left 09/16/2017   Procedure: LEFT CARPAL TUNNEL RELEASE;  Surgeon: Daryll Brod, MD;  Location: [facility];  Service: Orthopedics;  Laterality: Left;  . carpel tunnel surgery Right   . CATARACT EXTRACTION Bilateral 2007   w/ lens implants  . COLONOSCOPY    . DILATION AND CURETTAGE OF UTERUS    . EYE SURGERY    . FOOT SURGERY Right    hammer toe  . FRACTURE SURGERY     R tibia and fibula  . LEG SURGERY Right   . TONSILLECTOMY    . WISDOM TOOTH EXTRACTION       OB History   No obstetric history on file.      Home Medications    Prior to Admission medications   Medication Sig Start Date End Date Taking? Authorizing Provider  acetaminophen (TYLENOL ARTHRITIS PAIN) 650 MG CR tablet Take 1,300 mg by mouth 3 (three) times daily.    Yes [provider]  Acetylcysteine (NAC) 600 MG CAPS Take 600 mg by mouth 2 (two) times daily.   Yes [provider]  albuterol (VENTOLIN HFA) 108 (90 Base) MCG/ACT inhaler Inhale 2 puffs into the lungs every 6 (  six) hours as needed for wheezing or shortness of breath. 11/22/18  Yes Margaretha Seeds, MD  amLODipine (NORVASC) 5 MG tablet Take 5 mg at bedtime by mouth. 01/19/17  Yes [provider]  Carboxymethylcellul-Glycerin (REFRESH OPTIVE OP) Place 1-2 drops into both eyes daily as needed (dry eyes).    Yes [provider]  celecoxib (CELEBREX) 200 MG capsule TAKE 1 CAPSULE BY MOUTH EVERY DAY Patient taking differently: Take 200 mg by mouth daily.  11/14/18  Yes Deveshwar, Abel Presto, MD  cephALEXin (KEFLEX) 500 MG capsule Take 1 capsule by mouth 4 (four) times daily. 11/27/18  Yes [provider]  clotrimazole-betamethasone (LOTRISONE) cream Apply 1 application topically 2 (two) times daily as needed (rash).   Yes [provider]  cycloSPORINE (RESTASIS) 0.05 % ophthalmic emulsion Place 1 drop into both  eyes 2 (two) times daily.   Yes [provider]  DULoxetine (CYMBALTA) 60 MG capsule TAKE 1 CAPSULE BY MOUTH EVERY DAY IN THE MORNING Patient taking differently: Take 60 mg by mouth daily.  11/01/18  Yes Cottle, Billey Co., MD  econazole nitrate 1 % cream Apply 1 application topically 3 (three) times daily as needed (fungus).  10/07/18  Yes [provider]  fluticasone (FLONASE) 50 MCG/ACT nasal spray Place 1 spray into both nostrils daily.   Yes [provider]  furosemide (LASIX) 20 MG tablet Take 20 mg by mouth daily.  12/30/15  Yes [provider]  gabapentin (NEURONTIN) 300 MG capsule Take 600 mg by mouth at bedtime.    Yes [provider]  HYDROcodone-acetaminophen (NORCO) 5-325 MG tablet Take 1 tablet by mouth every 6 (six) hours as needed for moderate pain. 09/16/17  Yes Daryll Brod, MD  ketoconazole (NIZORAL) 2 % cream Apply 1 application topically 3 (three) times daily as needed for irritation.   Yes [provider]  levothyroxine (SYNTHROID) 25 MCG tablet Take 225 mcg by mouth daily before breakfast.    Yes [provider]  levothyroxine (SYNTHROID, LEVOTHROID) 200 MCG tablet Take 225 mcg by mouth daily before breakfast.    Yes [provider]  lisdexamfetamine (VYVANSE) 60 MG capsule Take 1 capsule (60 mg total) by mouth every morning. 11/29/18  Yes Cottle, Billey Co., MD  lithium carbonate (LITHOBID) 300 MG CR tablet 300mg  in the AM and 600mg  (take an additional 150mg  capsule)at bedtime. 10/16/18  Yes Cottle, Billey Co., MD  lithium carbonate 150 MG capsule TAKE 1 CAPSULE BY MOUTH AT BEDTIME. TAKE WITH 600MG  LITHIUM CARBONATE AT BEDTIME TO EQUAL 750MG  Patient taking differently: Take 750 mg by mouth at bedtime. Take along with 300 mg tablet 10/23/18  Yes Cottle, Billey Co., MD  loratadine (CLARITIN) 10 MG tablet Take 10 mg by mouth daily as needed for allergies.    Yes [provider]  LORazepam (ATIVAN) 0.5 MG  tablet Take 0.5 mg by mouth every 6 (six) hours as needed for anxiety.  09/12/17  Yes [provider]  methocarbamol (ROBAXIN) 500 MG tablet TAKE 1/2 TO 1 TABLET BY MOUTH UP TO 3 TIMES DAILY AS NEEDED FOR MUSCLE SPASMS Patient taking differently: Take 250-500 mg by mouth every 8 (eight) hours as needed for muscle spasms.  09/04/18  Yes Deveshwar, Abel Presto, MD  mirabegron ER (MYRBETRIQ) 50 MG TB24 tablet Take 50 mg at bedtime by mouth.  01/06/16  Yes [provider]  montelukast (SINGULAIR) 10 MG tablet Take 1 tablet by mouth daily. 11/10/18  Yes [provider]  Multiple Vitamins-Minerals (MULTIVITAMIN WITH MINERALS) tablet Take 1 tablet by mouth daily.   Yes [provider]  Multiple Vitamins-Minerals (PRESERVISION AREDS 2) CAPS Take 1 capsule 2 (two) times daily by mouth.   Yes [provider]  pravastatin (PRAVACHOL) 20 MG tablet Take 20 mg at bedtime by mouth.    Yes [provider]  Sodium Chloride, Hypertonic, (MURO 128 OP) Place 1 drop into both eyes 3 (three) times daily.    Yes [provider]  Thiamine HCl (VITAMIN B1) 100 MG TABS Take 1 tablet by mouth daily. 10/20/18  Yes [provider]  valsartan (DIOVAN) 80 MG tablet Take 80 mg by mouth daily.   Yes [provider]  vitamin B-12 (CYANOCOBALAMIN) 100 MCG tablet Take 100 mcg by mouth 2 (two) times daily. 10/20/18  Yes [provider]  Vitamin D, Ergocalciferol, (DRISDOL) 1.25 MG (50000 UT) CAPS capsule Take 50,000 Units by mouth every 7 (seven) days.   Yes [provider]    Family History Family History  Problem Relation Age of Onset  . Thyroid disease Mother   . Breast cancer Mother   . Heart disease Mother   . Bipolar disorder Father   . Diabetes Father   . Heart disease Father   . Dementia Father     Social History Social History   Tobacco Use  . Smoking status: Never Smoker  . Smokeless tobacco: Never Used  Substance Use Topics   . Alcohol use: Yes    Comment: rarely  . Drug use: Never     Allergies   Ibuprofen, Codeine, Nabumetone, Promethazine, Amoxicillin-pot clavulanate, Erythromycin base, and Promethazine-codeine   Review of Systems Review of Systems  Constitutional: Negative for chills and fever.  HENT: Negative for ear pain and sore throat.   Eyes: Positive for redness.  Respiratory: Negative for cough.   Cardiovascular: Positive for leg swelling. Negative for chest pain and palpitations.  Gastrointestinal: Negative for abdominal pain and vomiting.  Genitourinary: Negative for dysuria and hematuria.  Musculoskeletal: Negative for arthralgias and back pain.  Skin: Positive for rash.  Neurological: Negative for seizures and syncope.  All other systems reviewed and are negative.    Physical Exam Updated Vital Signs BP (!) 123/100   Pulse 88   Temp 98 F (36.7 C) (Oral)   Resp (!) 33   SpO2 99%   Physical Exam Vitals signs and nursing note reviewed.  Constitutional:      General: She is not in acute distress.    Appearance: She is well-developed. She is obese.  HENT:     Head: Normocephalic and atraumatic.  Eyes:     Comments: Bilateral conjunctival injection and chemosis  Neck:     Musculoskeletal: Neck supple.  Cardiovascular:     Rate and Rhythm: Normal rate and regular rhythm.     Heart sounds: No murmur.  Pulmonary:     Effort: Pulmonary effort is normal. No respiratory distress.     Breath sounds: Normal breath sounds.  Abdominal:     Palpations: Abdomen is soft.     Tenderness: There is no abdominal tenderness.  Musculoskeletal:     Right lower leg: Edema present.     Left lower leg: Edema present.  Skin:    General: Skin is warm and dry.     Comments: Left lower extremity edema worse than right.  Erythema extending to just below the knee.  Warm to touch.  Neurological:     Mental Status: She  is alert.      ED Treatments / Results  Labs (all labs ordered are  listed, but only abnormal results are displayed) Labs Reviewed  COMPREHENSIVE METABOLIC PANEL - Abnormal; Notable for the following components:      Result Value   Glucose, Bld 140 (*)    Creatinine, Ser 1.04 (*)    Calcium 10.6 (*)    Albumin 3.4 (*)    Alkaline Phosphatase 145 (*)    GFR calc non Af Amer 55 (*)    All other components within normal limits  CBC WITH DIFFERENTIAL/PLATELET - Abnormal; Notable for the following components:   WBC 15.0 (*)    MCV 107.5 (*)    MCHC 29.1 (*)    Neutro Abs 12.1 (*)    Monocytes Absolute 1.3 (*)    Abs Immature Granulocytes 0.24 (*)    All other components within normal limits  URINALYSIS, ROUTINE W REFLEX MICROSCOPIC - Abnormal; Notable for the following components:   Color, Urine STRAW (*)    All other components within normal limits  SARS CORONAVIRUS 2 (HOSPITAL ORDER, [facility])  LACTIC ACID, PLASMA  HIV ANTIBODY (ROUTINE TESTING W REFLEX)  CBC    EKG None  Radiology No results found.  Procedures Procedures (including critical care time)  Medications Ordered in ED Medications  sodium chloride flush (NS) 0.9 % injection 3 mL (has no administration in time range)  enoxaparin (LOVENOX) injection 40 mg (has no administration in time range)  acetaminophen (TYLENOL) tablet 650 mg (has no administration in time range)    Or  acetaminophen (TYLENOL) suppository 650 mg (has no administration in time range)  vancomycin (VANCOCIN) 2,000 mg in sodium chloride 0.9 % 500 mL IVPB (has no administration in time range)  vancomycin (VANCOCIN) 1,250 mg in sodium chloride 0.9 % 250 mL IVPB ( Intravenous Canceled Entry 11/30/18 0020)     Initial Impression / Assessment and Plan / ED Course  I have reviewed the triage vital signs and the nursing notes.  Pertinent labs & imaging results that were available during my care of the patient were reviewed by me and considered in my medical decision making (see chart for  details).        Ms. Dedeaux is a 68 year old female with a history of hypertension, hyperlipidemia, hypothyroidism, bipolar disorder, OSA, and chronic hypoxemic respiratory failure.  She presents today with history and exam most consistent with cellulitis of the left lower extremity.  She has failed outpatient therapy.  Will admit for inpatient treatment until she is appropriate for discharge.  Labs show leukocytosis.  Otherwise within normal limits.   No lactic acid and vitals.  Does not have signs of sepsis.  Started on IV clindamycin discussed with inpatient hospitalist service who will admit.  Final Clinical Impressions(s) / ED Diagnoses   Final diagnoses:  Cellulitis of left lower extremity  Morbid obesity ([place])  Chronic hypoxemic respiratory failure ([place])  Bilateral lower extremity edema    ED Discharge Orders    None         Tillie Fantasia, MD 11/30/18 [facility]    Pattricia Boss, MD 11/30/18 [phone]

## 2018-11-29 NOTE — ED Triage Notes (Signed)
Pt has what she thinks is a insect bite that has became infected. Pt states she had two bites on her left lower leg 2 weeks ago over the last week it has became very swollen and red. Pt has edema in her left lower leg into her foot.

## 2018-11-29 NOTE — Telephone Encounter (Signed)
Pt called to request refill on Vyvanse [facility] [address]

## 2018-11-29 NOTE — Progress Notes (Signed)
Pharmacy Antibiotic Note  Jessica [name] is a 68 y.o. female admitted on 11/29/2018 with LLE cellulitis.  Pharmacy has been consulted for Vancomycin dosing.  Plan: Vancomycin 2 g IV now, then Vancomycin 1250 mg IV q12h   Temp (24hrs), Avg:98.2 F (36.8 C), Min:98 F (36.7 C), Max:98.4 F (36.9 C)  Recent Labs  Lab 11/29/18 1749  WBC 15.0*  CREATININE 1.04*  LATICACIDVEN 0.8    Estimated Creatinine Clearance: 71.2 mL/min (A) (by C-G formula based on SCr of 1.04 mg/dL (H)).    Allergies  Allergen Reactions  . Ibuprofen Diarrhea, Nausea Only and Other (See Comments)    Extreme stomach pain  Makes her feel bad  . Codeine     UNSPECIFIED REACTION   . Nabumetone     UNSPECIFIED REACTION   . Promethazine     UNSPECIFIED REACTION   . Amoxicillin-Pot Clavulanate Nausea And Vomiting  . Erythromycin Base Diarrhea  . Promethazine-Codeine Other (See Comments)    "Makes her feel bad"    Caryl Pina 11/29/2018 11:58 PM

## 2018-11-29 NOTE — Telephone Encounter (Signed)
Last fill 07/01, will pend for provider

## 2018-11-29 NOTE — H&P (Signed)
History and Physical    Jessica [name] YIF:[id number] DOB: 06-03-1950 DOA: 11/29/2018  PCP: Jessica [name] Patient coming from: Home  Chief Complaint: Insect bite  HPI: Jessica [name] is a 68 y.o. female with medical history significant of arthritis, bipolar disorder, depression, GERD, hypertension, hyperlipidemia, hypothyroidism, IBS, OSA, spinal stenosis, lupus chronic hypoxemic respiratory failure on home oxygen (2 L at rest and 4 L with activity/ sleep) presenting to the hospital for evaluation of a insect bite.  Patient states she was doing yard work about 2 weeks ago and believes she was bit by an insect as she felt a sharp stinging sensation in the mid shin region of her left leg.  Over the course of the next few days she noticed an area of redness which was about 3 x 4 cm in diameter.  The redness has continued to expand over the course of the last 2 weeks.  States she saw an outpatient physician who prescribed her a cream to apply on her leg which did not cause any improvement.  States she was not prescribed any oral antibiotic.  Her temperature has been in the 98 degree range at home.  States she was started on home oxygen about 1-1/2 weeks ago which she uses at night and sometimes during the day.  States both of her legs are chronically swollen no other complaints.  ED Course: Slightly tachycardic on arrival, pulse subsequently normal.  Remainder of vitals stable.  White count 15.0 with left shift.  Lactic acid normal.  COVID-19 rapid test pending.  Patient was started on IV clindamycin.  Review of Systems:  All systems reviewed and apart from history of presenting illness, are negative.  Past Medical History:  Diagnosis Date  . Arthritis    lower back, hands, knees  . Bipolar disorder ([place])   . Depression   . Dyspnea    with exertion  . GERD (gastroesophageal reflux disease)    patient unsure about this dx - no meds  . Headache   . History of IBS    watches diet   . Hyperlipidemia   . Hypertension   . Hypothyroidism   . Obese   . Plantar fasciitis   . Seasonal allergies   . Sleep apnea 2017   uses CPAP   . Spinal stenosis of lumbar region   . SVD (spontaneous vaginal delivery)    x 2  . Systemic lupus ([place])   . Thyroid disease     Past Surgical History:  Procedure Laterality Date  . CARPAL TUNNEL RELEASE Left 09/16/2017   Procedure: LEFT CARPAL TUNNEL RELEASE;  Surgeon: Daryll Brod, [name];  Location: [facility];  Service: Orthopedics;  Laterality: Left;  . carpel tunnel surgery Right   . CATARACT EXTRACTION Bilateral 2007   w/ lens implants  . COLONOSCOPY    . DILATION AND CURETTAGE OF UTERUS    . EYE SURGERY    . FOOT SURGERY Right    hammer toe  . FRACTURE SURGERY     R tibia and fibula  . LEG SURGERY Right   . TONSILLECTOMY    . WISDOM TOOTH EXTRACTION       reports that she has never smoked. She has never used smokeless tobacco. She reports current alcohol use. She reports that she does not use drugs.  Allergies  Allergen Reactions  . Ibuprofen Diarrhea, Nausea Only and Other (See Comments)    Extreme stomach pain  Makes her feel bad  . Codeine  UNSPECIFIED REACTION   . Nabumetone     UNSPECIFIED REACTION   . Promethazine     UNSPECIFIED REACTION   . Amoxicillin-Pot Clavulanate Nausea And Vomiting  . Erythromycin Base Diarrhea  . Promethazine-Codeine Other (See Comments)    "Makes her feel bad"    Family History  Problem Relation Age of Onset  . Thyroid disease Mother   . Breast cancer Mother   . Heart disease Mother   . Bipolar disorder Father   . Diabetes Father   . Heart disease Father   . Dementia Father     Prior to Admission medications   Medication Sig Start Date End Date Taking? Authorizing Provider  acetaminophen (TYLENOL ARTHRITIS PAIN) 650 MG CR tablet Take 1,300 mg by mouth 3 (three) times daily.     Provider, Historical, [name]  Acetylcysteine (NAC) 600 MG CAPS Take 600 mg by mouth 2 (two) times daily.     Provider, Historical, [name]  albuterol (VENTOLIN HFA) 108 (90 Base) MCG/ACT inhaler Inhale 2 puffs into the lungs every 6 (six) hours as needed for wheezing or shortness of breath. 11/22/18   Margaretha Seeds, [name]  amLODipine (NORVASC) 5 MG tablet Take 5 mg at bedtime by mouth. 01/19/17   Provider, Historical, [name]  Carboxymethylcellul-Glycerin (REFRESH OPTIVE OP) Apply to eye daily.    Provider, Historical, [name]  celecoxib (CELEBREX) 200 MG capsule TAKE 1 CAPSULE BY MOUTH EVERY DAY 11/14/18   Bo Merino, [name]  Cholecalciferol (VITAMIN D) 2000 units CAPS Take 2,000 Units daily by mouth.    Provider, Historical, [name]  cycloSPORINE (RESTASIS) 0.05 % ophthalmic emulsion Place 1 drop into both eyes 2 (two) times daily.    Provider, Historical, [name]  DULoxetine (CYMBALTA) 60 MG capsule TAKE 1 CAPSULE BY MOUTH EVERY DAY IN THE MORNING 11/01/18   [employer]ottle[employer] [name]  furosemide (LASIX) 20 MG tablet TAKE 1 TABLET BY MOUTH EVERY DAY 12/30/15   Provider, Historical, [name]  gabapentin (NEURONTIN) 300 MG capsule 600 mg at bedtime.     Provider, Historical, [name]  HYDROcodone-acetaminophen (NORCO) 5-325 MG tablet Take 1 tablet by mouth every 6 (six) hours as needed for moderate pain. 09/16/17   Daryll Brod, [name]  levothyroxine (SYNTHROID) 25 MCG tablet Synthroid 25 mcg tablet once daily    Provider, Historical, [name]  levothyroxine (SYNTHROID, LEVOTHROID) 200 MCG tablet Take 200 mcg by mouth daily before breakfast.    Provider, Historical, [name]  lisdexamfetamine (VYVANSE) 60 MG capsule Take 1 capsule (60 mg total) by mouth every morning. 11/29/18   Cottle, [employer] [name]  lithium carbonate (LITHOBID) 300 MG CR tablet 300mg  in the AM and 600mg  (take an additional 150mg  capsule)at bedtime. 10/16/18   Cottle, [employer] [name]  lithium carbonate 150 MG capsule TAKE 1 CAPSULE BY MOUTH AT BEDTIME. TAKE WITH 600MG  LITHIUM CARBONATE AT BEDTIME TO EQUAL 750MG  10/23/18   Cottle, [employer] [name]  loratadine (CLARITIN) 10 MG tablet Take 10 mg by  mouth daily as needed for allergies.     Provider, Historical, [name]  LORazepam (ATIVAN) 0.5 MG tablet Take 0.5 mg by mouth as needed.  09/12/17   Provider, Historical, [name]  methocarbamol (ROBAXIN) 500 MG tablet TAKE 1/2 TO 1 TABLET BY MOUTH UP TO 3 TIMES DAILY AS NEEDED FOR MUSCLE SPASMS 09/04/18   Bo Merino, [name]  mirabegron ER (MYRBETRIQ) 50 MG TB24 tablet Take 50 mg at bedtime by mouth.  01/06/16   Provider, Historical, [name]  Multiple Vitamins-Minerals (MULTIVITAMIN  WITH MINERALS) tablet Take 1 tablet by mouth daily.    Provider, Historical, [name]  Multiple Vitamins-Minerals (PRESERVISION AREDS 2) CAPS Take 1 capsule 2 (two) times daily by mouth.    Provider, Historical, [name]  nystatin cream (MYCOSTATIN) Apply 1 application topically 2 (two) times daily as needed (rash).    Provider, Historical, [name]  pravastatin (PRAVACHOL) 20 MG tablet Take 20 mg at bedtime by mouth.     Provider, Historical, [name]  Sodium Chloride, Hypertonic, (MURO 128 OP) Apply to eye daily.    Provider, Historical, [name]    Physical Exam: Vitals:   11/30/18 0030 11/30/18 0100 11/30/18 0130 11/30/18 0145  BP: 134/77 (!) 148/82 (!) 158/71 (!) 158/71  Pulse: 89 87 92 90  Resp: (!) 21 17 (!) 22 19  Temp:      TempSrc:      SpO2: 100% 99% 98% 95%    Physical Exam  Constitutional: She is oriented to person, place, and time. She appears well-developed and well-nourished. No distress.  Morbidly obese  HENT:  Head: Normocephalic.  Mouth/Throat: Oropharynx is clear and moist.  Eyes: EOM are normal.  Neck: Neck supple.  Cardiovascular: Normal rate, regular rhythm and intact distal pulses.  Pulmonary/Chest: Effort normal. She has no wheezes. She has no rales.  Anterior lung fields clear to auscultation  Abdominal: Soft. Bowel sounds are normal. She exhibits no distension. There is no abdominal tenderness. There is no guarding.  Musculoskeletal:        General: Edema present.     Comments: Significant +4 pitting edema of bilateral  lower extremities. Left lower extremity: Erythematous and warm to touch from below the knee level down to the foot.  Appears more erythematous compared to the right lower extremity.  Active purulent drainage from a small skin opening in the mid shin region.   Neurological: She is alert and oriented to person, place, and time.  Skin: Skin is warm and dry. She is not diaphoretic.     Labs on Admission: I have personally reviewed following labs and imaging studies  CBC: Recent Labs  Lab 11/29/18 1749  WBC 15.0*  NEUTROABS 12.1*  HGB 13.0  HCT 44.6  MCV 107.5*  PLT 948   Basic Metabolic Panel: Recent Labs  Lab 11/29/18 1749  NA 136  K 4.3  CL 103  CO2 24  GLUCOSE 140*  BUN 17  CREATININE 1.04*  CALCIUM 10.6*   GFR: Estimated Creatinine Clearance: 71.2 mL/min (A) (by C-G formula based on SCr of 1.04 mg/dL (H)). Liver Function Tests: Recent Labs  Lab 11/29/18 1749  AST 31  ALT 37  ALKPHOS 145*  BILITOT 0.6  PROT 6.6  ALBUMIN 3.4*   No results for input(s): LIPASE, AMYLASE in the last 168 hours. No results for input(s): AMMONIA in the last 168 hours. Coagulation Profile: No results for input(s): INR, PROTIME in the last 168 hours. Cardiac Enzymes: No results for input(s): CKTOTAL, CKMB, CKMBINDEX, TROPONINI in the last 168 hours. BNP (last 3 results) No results for input(s): PROBNP in the last 8760 hours. HbA1C: No results for input(s): HGBA1C in the last 72 hours. CBG: No results for input(s): GLUCAP in the last 168 hours. Lipid Profile: No results for input(s): CHOL, HDL, LDLCALC, TRIG, CHOLHDL, LDLDIRECT in the last 72 hours. Thyroid Function Tests: No results for input(s): TSH, T4TOTAL, FREET4, T3FREE, THYROIDAB in the last 72 hours. Anemia Panel: No results for input(s): VITAMINB12, FOLATE, FERRITIN, TIBC, IRON, RETICCTPCT in the last 72 hours. Urine  analysis:    Component Value Date/Time   COLORURINE STRAW (A) 11/29/2018 1747   APPEARANCEUR CLEAR  11/29/2018 1747   LABSPEC 1.011 11/29/2018 1747   PHURINE 6.0 11/29/2018 1747   GLUCOSEU NEGATIVE 11/29/2018 1747   HGBUR NEGATIVE 11/29/2018 1747   BILIRUBINUR NEGATIVE 11/29/2018 1747   KETONESUR NEGATIVE 11/29/2018 1747   PROTEINUR NEGATIVE 11/29/2018 1747   NITRITE NEGATIVE 11/29/2018 1747   LEUKOCYTESUR NEGATIVE 11/29/2018 1747    Radiological Exams on Admission: No results found.  Assessment/Plan Principal Problem:   Cellulitis Active Problems:   HTN (hypertension)   Hypothyroidism   OSA on CPAP   Lower extremity edema   Purulent cellulitis of left lower extremity Presenting with a 2-week history of progressively worsening left lower extremity erythema, warmth, and edema after an insect bite.  Purulent drainage noted.  Afebrile and currently hemodynamically stable. White count 15.0 with left shift.  Lactic acid normal. -Received IV clindamycin in the ED.  Will change antibiotic to IV vancomycin given presence of purulence. -Continue to monitor CBC -Tylenol PRN  Bilateral lower extremity edema, supplemental oxygen requirement Patient was recently started on supplemental home oxygen (up to 4 L with activity). Noted to have significant bilateral lower extremity pitting edema. Chest x-ray done on November 16, 2018 with findings concerning for CHF - showed mild cardiomegaly, which appeared increased in size compared to prior exams.  No overt pulmonary edema. -IV Lasix 20 mg twice daily -Echocardiogram -Although she currently has signs of cellulitis, will also order left lower extremity Doppler to rule out DVT as this extremity appears more edematous compared to the contralateral side.  OSA -CPAP at night  Bipolar disorder -Continue home medications  Hypertension -Continue home amlodipine -Lasix as mentioned above  Hypothyroidism -Continue home Synthroid  Anxiety -Continue home Ativan PRN  Asthma Stable.  No bronchospasm. -Continue home Singulair, albuterol PRN   DVT prophylaxis: Lovenox Code Status: Patient wishes to be full code. Family Communication: No family available at this time. Disposition Plan: Anticipate discharge after clinical improvement. Consults called: None Admission status: It is my clinical opinion that referral for OBSERVATION is reasonable and necessary in this patient based on the above information provided. The aforementioned taken together are felt to place the patient at high risk for further clinical deterioration. However it is anticipated that the patient may be medically stable for discharge from the hospital within 24 to 48 hours.  The medical decision making on this patient was of high complexity and the patient is at high risk for clinical deterioration, therefore this is a level 3 visit.  Shela Leff [name] [facility] Pager [phone]  If 7PM-7AM, please contact night-coverage [email or web address] Password [email or web address]  11/30/2018, 2:46 AM

## 2018-11-30 ENCOUNTER — Other Ambulatory Visit: Payer: Self-pay

## 2018-11-30 ENCOUNTER — Observation Stay (HOSPITAL_COMMUNITY): Payer: Medicare Other

## 2018-11-30 ENCOUNTER — Encounter (HOSPITAL_COMMUNITY): Payer: Self-pay | Admitting: Internal Medicine

## 2018-11-30 DIAGNOSIS — J45909 Unspecified asthma, uncomplicated: Secondary | ICD-10-CM | POA: Diagnosis present

## 2018-11-30 DIAGNOSIS — Y93H2 Activity, gardening and landscaping: Secondary | ICD-10-CM | POA: Diagnosis not present

## 2018-11-30 DIAGNOSIS — I82402 Acute embolism and thrombosis of unspecified deep veins of left lower extremity: Secondary | ICD-10-CM | POA: Diagnosis not present

## 2018-11-30 DIAGNOSIS — M329 Systemic lupus erythematosus, unspecified: Secondary | ICD-10-CM | POA: Diagnosis present

## 2018-11-30 DIAGNOSIS — B379 Candidiasis, unspecified: Secondary | ICD-10-CM | POA: Diagnosis present

## 2018-11-30 DIAGNOSIS — L03116 Cellulitis of left lower limb: Secondary | ICD-10-CM | POA: Diagnosis not present

## 2018-11-30 DIAGNOSIS — Y92007 Garden or yard of unspecified non-institutional (private) residence as the place of occurrence of the external cause: Secondary | ICD-10-CM | POA: Diagnosis not present

## 2018-11-30 DIAGNOSIS — I5030 Unspecified diastolic (congestive) heart failure: Secondary | ICD-10-CM | POA: Diagnosis present

## 2018-11-30 DIAGNOSIS — Z961 Presence of intraocular lens: Secondary | ICD-10-CM | POA: Diagnosis present

## 2018-11-30 DIAGNOSIS — E662 Morbid (severe) obesity with alveolar hypoventilation: Secondary | ICD-10-CM | POA: Diagnosis present

## 2018-11-30 DIAGNOSIS — F319 Bipolar disorder, unspecified: Secondary | ICD-10-CM | POA: Diagnosis present

## 2018-11-30 DIAGNOSIS — Z9841 Cataract extraction status, right eye: Secondary | ICD-10-CM | POA: Diagnosis not present

## 2018-11-30 DIAGNOSIS — E079 Disorder of thyroid, unspecified: Secondary | ICD-10-CM | POA: Diagnosis present

## 2018-11-30 DIAGNOSIS — R600 Localized edema: Secondary | ICD-10-CM | POA: Diagnosis present

## 2018-11-30 DIAGNOSIS — E039 Hypothyroidism, unspecified: Secondary | ICD-10-CM | POA: Diagnosis present

## 2018-11-30 DIAGNOSIS — Z888 Allergy status to other drugs, medicaments and biological substances status: Secondary | ICD-10-CM | POA: Diagnosis not present

## 2018-11-30 DIAGNOSIS — Z9842 Cataract extraction status, left eye: Secondary | ICD-10-CM | POA: Diagnosis not present

## 2018-11-30 DIAGNOSIS — Z885 Allergy status to narcotic agent status: Secondary | ICD-10-CM | POA: Diagnosis not present

## 2018-11-30 DIAGNOSIS — F419 Anxiety disorder, unspecified: Secondary | ICD-10-CM | POA: Diagnosis present

## 2018-11-30 DIAGNOSIS — E785 Hyperlipidemia, unspecified: Secondary | ICD-10-CM | POA: Diagnosis present

## 2018-11-30 DIAGNOSIS — Z20828 Contact with and (suspected) exposure to other viral communicable diseases: Secondary | ICD-10-CM | POA: Diagnosis present

## 2018-11-30 DIAGNOSIS — I361 Nonrheumatic tricuspid (valve) insufficiency: Secondary | ICD-10-CM | POA: Diagnosis not present

## 2018-11-30 DIAGNOSIS — M1990 Unspecified osteoarthritis, unspecified site: Secondary | ICD-10-CM | POA: Diagnosis present

## 2018-11-30 DIAGNOSIS — J9611 Chronic respiratory failure with hypoxia: Secondary | ICD-10-CM | POA: Diagnosis present

## 2018-11-30 DIAGNOSIS — Z6841 Body Mass Index (BMI) 40.0 and over, adult: Secondary | ICD-10-CM | POA: Diagnosis not present

## 2018-11-30 DIAGNOSIS — I110 Hypertensive heart disease with heart failure: Secondary | ICD-10-CM | POA: Diagnosis present

## 2018-11-30 DIAGNOSIS — K589 Irritable bowel syndrome without diarrhea: Secondary | ICD-10-CM | POA: Diagnosis present

## 2018-11-30 DIAGNOSIS — S80262A Insect bite (nonvenomous), left knee, initial encounter: Secondary | ICD-10-CM | POA: Diagnosis present

## 2018-11-30 DIAGNOSIS — W57XXXA Bitten or stung by nonvenomous insect and other nonvenomous arthropods, initial encounter: Secondary | ICD-10-CM | POA: Diagnosis present

## 2018-11-30 DIAGNOSIS — K219 Gastro-esophageal reflux disease without esophagitis: Secondary | ICD-10-CM | POA: Diagnosis present

## 2018-11-30 LAB — URINALYSIS, ROUTINE W REFLEX MICROSCOPIC
Bilirubin Urine: NEGATIVE
Glucose, UA: NEGATIVE mg/dL
Hgb urine dipstick: NEGATIVE
Ketones, ur: NEGATIVE mg/dL
Leukocytes,Ua: NEGATIVE
Nitrite: NEGATIVE
Protein, ur: NEGATIVE mg/dL
Specific Gravity, Urine: 1.011 (ref 1.005–1.030)
pH: 6 (ref 5.0–8.0)

## 2018-11-30 LAB — CBC
HCT: 44.7 % (ref 36.0–46.0)
Hemoglobin: 13 g/dL (ref 12.0–15.0)
MCH: 31 pg (ref 26.0–34.0)
MCHC: 29.1 g/dL — ABNORMAL LOW (ref 30.0–36.0)
MCV: 106.4 fL — ABNORMAL HIGH (ref 80.0–100.0)
Platelets: 253 10*3/uL (ref 150–400)
RBC: 4.2 MIL/uL (ref 3.87–5.11)
RDW: 14.5 % (ref 11.5–15.5)
WBC: 14.2 10*3/uL — ABNORMAL HIGH (ref 4.0–10.5)
nRBC: 0 % (ref 0.0–0.2)

## 2018-11-30 LAB — SARS CORONAVIRUS 2 BY RT PCR (HOSPITAL ORDER, PERFORMED IN ~~LOC~~ HOSPITAL LAB): SARS Coronavirus 2: NEGATIVE

## 2018-11-30 LAB — GLUCOSE, CAPILLARY: Glucose-Capillary: 101 mg/dL — ABNORMAL HIGH (ref 70–99)

## 2018-11-30 MED ORDER — FUROSEMIDE 10 MG/ML IJ SOLN
40.0000 mg | Freq: Two times a day (BID) | INTRAMUSCULAR | Status: DC
  Administered 2018-11-30 – 2018-12-01 (×2): 40 mg via INTRAVENOUS
  Filled 2018-11-30 (×2): qty 4

## 2018-11-30 MED ORDER — ENOXAPARIN SODIUM 80 MG/0.8ML ~~LOC~~ SOLN
70.0000 mg | Freq: Every day | SUBCUTANEOUS | Status: DC
  Administered 2018-11-30 – 2018-12-03 (×4): 70 mg via SUBCUTANEOUS
  Filled 2018-11-30 (×4): qty 0.7

## 2018-11-30 MED ORDER — FUROSEMIDE 10 MG/ML IJ SOLN
20.0000 mg | Freq: Two times a day (BID) | INTRAMUSCULAR | Status: DC
  Administered 2018-11-30: 20 mg via INTRAVENOUS
  Filled 2018-11-30: qty 2

## 2018-11-30 MED ORDER — LORATADINE 10 MG PO TABS: 10.0000 mg | ORAL_TABLET | Freq: Every day | ORAL | Status: DC | PRN

## 2018-11-30 MED ORDER — FLUTICASONE PROPIONATE 50 MCG/ACT NA SUSP
1.0000 | Freq: Every day | NASAL | Status: DC
  Administered 2018-11-30 – 2018-12-03 (×4): 1 via NASAL
  Filled 2018-11-30: qty 16

## 2018-11-30 MED ORDER — ACETAMINOPHEN 650 MG RE SUPP: 650.0000 mg | Freq: Four times a day (QID) | RECTAL | Status: DC | PRN

## 2018-11-30 MED ORDER — AMLODIPINE BESYLATE 5 MG PO TABS
5.0000 mg | ORAL_TABLET | Freq: Every day | ORAL | Status: DC
  Administered 2018-11-30 – 2018-12-02 (×4): 5 mg via ORAL
  Filled 2018-11-30 (×4): qty 1

## 2018-11-30 MED ORDER — GABAPENTIN 300 MG PO CAPS
600.0000 mg | ORAL_CAPSULE | Freq: Every day | ORAL | Status: DC
  Administered 2018-11-30 – 2018-12-02 (×4): 600 mg via ORAL
  Filled 2018-11-30 (×5): qty 2

## 2018-11-30 MED ORDER — DULOXETINE HCL 60 MG PO CPEP
60.0000 mg | ORAL_CAPSULE | Freq: Every day | ORAL | Status: DC
  Administered 2018-11-30 – 2018-12-03 (×4): 60 mg via ORAL
  Filled 2018-11-30 (×4): qty 1

## 2018-11-30 MED ORDER — METHOCARBAMOL 500 MG PO TABS
250.0000 mg | ORAL_TABLET | Freq: Three times a day (TID) | ORAL | Status: DC | PRN
  Administered 2018-12-01: 250 mg via ORAL
  Filled 2018-11-30: qty 1

## 2018-11-30 MED ORDER — MIRABEGRON ER 50 MG PO TB24
50.0000 mg | ORAL_TABLET | Freq: Every day | ORAL | Status: DC
  Administered 2018-11-30 – 2018-12-02 (×3): 50 mg via ORAL
  Filled 2018-11-30 (×4): qty 1

## 2018-11-30 MED ORDER — VITAMIN B-12 1000 MCG PO TABS
1000.0000 ug | ORAL_TABLET | Freq: Two times a day (BID) | ORAL | Status: DC
  Administered 2018-11-30 – 2018-12-03 (×7): 1000 ug via ORAL
  Filled 2018-11-30 (×7): qty 1

## 2018-11-30 MED ORDER — LORAZEPAM 0.5 MG PO TABS: 0.5000 mg | ORAL_TABLET | Freq: Four times a day (QID) | ORAL | Status: DC | PRN

## 2018-11-30 MED ORDER — LITHIUM CARBONATE ER 300 MG PO TBCR
300.0000 mg | EXTENDED_RELEASE_TABLET | Freq: Every day | ORAL | Status: DC
  Administered 2018-11-30 – 2018-12-03 (×4): 300 mg via ORAL
  Filled 2018-11-30 (×4): qty 1

## 2018-11-30 MED ORDER — PIPERACILLIN-TAZOBACTAM 3.375 G IVPB
3.3750 g | Freq: Three times a day (TID) | INTRAVENOUS | Status: DC
  Administered 2018-11-30 – 2018-12-02 (×6): 3.375 g via INTRAVENOUS
  Filled 2018-11-30 (×6): qty 50

## 2018-11-30 MED ORDER — LEVOTHYROXINE SODIUM 75 MCG PO TABS
225.0000 ug | ORAL_TABLET | Freq: Every day | ORAL | Status: DC
  Administered 2018-11-30 – 2018-12-03 (×4): 225 ug via ORAL
  Filled 2018-11-30 (×4): qty 3

## 2018-11-30 MED ORDER — VITAMIN B-1 100 MG PO TABS
100.0000 mg | ORAL_TABLET | Freq: Every day | ORAL | Status: DC
  Administered 2018-11-30 – 2018-12-03 (×4): 100 mg via ORAL
  Filled 2018-11-30 (×5): qty 1

## 2018-11-30 MED ORDER — LISDEXAMFETAMINE DIMESYLATE 30 MG PO CAPS
60.0000 mg | ORAL_CAPSULE | ORAL | Status: DC
  Administered 2018-11-30 – 2018-12-03 (×4): 60 mg via ORAL
  Filled 2018-11-30 (×4): qty 2

## 2018-11-30 MED ORDER — VANCOMYCIN HCL 10 G IV SOLR
1250.0000 mg | Freq: Two times a day (BID) | INTRAVENOUS | Status: DC
  Administered 2018-11-30 – 2018-12-02 (×6): 1250 mg via INTRAVENOUS
  Filled 2018-11-30 (×10): qty 1250

## 2018-11-30 MED ORDER — LITHIUM CARBONATE 300 MG PO CAPS
750.0000 mg | ORAL_CAPSULE | Freq: Every day | ORAL | Status: DC
  Administered 2018-11-30 – 2018-12-02 (×3): 750 mg via ORAL
  Filled 2018-11-30 (×4): qty 1

## 2018-11-30 MED ORDER — MONTELUKAST SODIUM 10 MG PO TABS
10.0000 mg | ORAL_TABLET | Freq: Every day | ORAL | Status: DC
  Administered 2018-11-30 – 2018-12-03 (×4): 10 mg via ORAL
  Filled 2018-11-30 (×4): qty 1

## 2018-11-30 MED ORDER — ENOXAPARIN SODIUM 40 MG/0.4ML ~~LOC~~ SOLN: 40.0000 mg | Freq: Every day | SUBCUTANEOUS | Status: DC

## 2018-11-30 MED ORDER — ACETAMINOPHEN 325 MG PO TABS
650.0000 mg | ORAL_TABLET | Freq: Four times a day (QID) | ORAL | Status: DC | PRN
  Administered 2018-11-30 – 2018-12-01 (×4): 650 mg via ORAL
  Filled 2018-11-30 (×4): qty 2

## 2018-11-30 NOTE — Progress Notes (Addendum)
PROGRESS NOTE    Jessica [name]  LXB:[id number] DOB: [birth year] DOA: 11/29/2018 PCP: Alroy Dust, L.Marlou Sa, MD   Brief Narrative:  Patient is a 68 year old female with history of arthritis, bipolar disorder, depression, GERD, hypertension, hyperlipidemia, hypothyroidism, OSA on CPAP, home oxygen, chronic hypoxic respiratory failure who presented to the emergency department for further evaluation of redness, swelling of left lower extremity after an insect bite.  Patient was admitted for the management of left lower extremity cellulitis with IV antibiotics.  Assessment & Plan:   Principal Problem:   Cellulitis Active Problems:   HTN (hypertension)   Hypothyroidism   OSA on CPAP   Chronic hypoxemic respiratory failure (HCC)   Lower extremity edema   Systemic lupus (HCC)   Obese   Bipolar disorder (HCC)   Left lower extremity cellulitis: Presented with 2 weeks history of worsening erythema, edema after an insect bite. purulent drainage also noted.  Currently on vancomycin and Zosyn.  We will follow-up cultures.  Venous duplex did not show any DVT.  Had fever at home.  Has mild leukocytosis was improving.  Bilateral lower extremity edema: Echocardiogram has been ordered.  Needs to rule out CHF.  Currently on Lasix 40 mg IV twice a day.  On Lasix 20 mg daily at home.  OSA: Follows with pulmonology.  CPAP at night  Bipolar disorder: Continue her home meds  Hypertension: Currently Stable.  Continue current medicines  Hypothyroidism: Continue Synthyroid  Anxiety: Continue antibiotics as needed  History of asthma: Currently stable.  No wheezing  Heard  Debility/deconditioning: We will request for physical therapy evaluation.  Patient will be changed to inpatient.She requires IV antibiotics due to concerns for a rapidly progressive cellulitis and that PO antibiotics would not be a safe option at this time.          DVT prophylaxis: Lovenox Code Status: Full code Family  Communication: Discussed with the patient Disposition Plan: Likely home after improvement in the cellulitis   Consultants: None  Procedures: None  Antimicrobials:  Anti-infectives (From admission, onward)   Start     Dose/Rate Route Frequency Ordered Stop   11/30/18 1400  piperacillin-tazobactam (ZOSYN) IVPB 3.375 g     3.375 g 12.5 mL/hr over 240 Minutes Intravenous Every 8 hours 11/30/18 1147     11/30/18 0015  vancomycin (VANCOCIN) 1,250 mg in sodium chloride 0.9 % 250 mL IVPB     1,250 mg 166.7 mL/hr over 90 Minutes Intravenous Every 12 hours 11/30/18 0008     11/30/18 0000  vancomycin (VANCOCIN) IVPB 1000 mg/200 mL premix  Status:  Discontinued     1,000 mg 200 mL/hr over 60 Minutes Intravenous  Once 11/29/18 2346 11/29/18 2356   11/30/18 0000  vancomycin (VANCOCIN) 2,000 mg in sodium chloride 0.9 % 500 mL IVPB     2,000 mg 250 mL/hr over 120 Minutes Intravenous  Once 11/29/18 2356 11/30/18 0231   11/29/18 2200  clindamycin (CLEOCIN) IVPB 600 mg  Status:  Discontinued     600 mg 100 mL/hr over 30 Minutes Intravenous Every 8 hours 11/29/18 2159 11/29/18 2346      Subjective:  Patient seen and examined bedside this morning.  Hemodynamically stable.  Looks drowsy/sleepy this morning.  Still has significant edema of the left lower extremity with erythema.  Objective: Vitals:   11/30/18 0357 11/30/18 0412 11/30/18 0811 11/30/18 1236  BP: (!) 141/75  (!) 146/84 (!) 142/74  Pulse: 96 97 93 95  Resp: 18 18 16 18   Temp: 98.1  F (36.7 C)  98.8 F (37.1 C) 98.6 F (37 C)  TempSrc: Oral  Oral Oral  SpO2: 96% 97% 96% 96%    Intake/Output Summary (Last 24 hours) at 11/30/2018 1307 Last data filed at 11/30/2018 1100 Gross per 24 hour  Intake 343 ml  Output 1300 ml  Net -957 ml   There were no vitals filed for this visit.  Examination:  General exam: Not in distress, obese, sleepy/drowsy HEENT:PERRL,Oral mucosa moist, Ear/Nose normal on gross exam Respiratory system:  Bilateral equal air entry, normal vesicular breath sounds, no wheezes or crackles  Cardiovascular system: S1 & S2 heard, RRR. No JVD, murmurs, rubs, gallops or clicks.  Gastrointestinal system: Abdomen is nondistended, soft and nontender. No organomegaly or masses felt. Normal bowel sounds heard. Central nervous system: Alert and oriented. No focal neurological deficits. Extremities: Bilateral lower extremity edema, severe erythema, area of purulent drainage on the left lower extremity Skin: No rashes, lesions or ulcers,no icterus ,no pallor     Data Reviewed: I have personally reviewed following labs and imaging studies  CBC: Recent Labs  Lab 11/29/18 1749 11/30/18 0657  WBC 15.0* 14.2*  NEUTROABS 12.1*  --   HGB 13.0 13.0  HCT 44.6 44.7  MCV 107.5* 106.4*  PLT 301 621   Basic Metabolic Panel: Recent Labs  Lab 11/29/18 1749  NA 136  K 4.3  CL 103  CO2 24  GLUCOSE 140*  BUN 17  CREATININE 1.04*  CALCIUM 10.6*   GFR: Estimated Creatinine Clearance: 71.2 mL/min (A) (by C-G formula based on SCr of 1.04 mg/dL (H)). Liver Function Tests: Recent Labs  Lab 11/29/18 1749  AST 31  ALT 37  ALKPHOS 145*  BILITOT 0.6  PROT 6.6  ALBUMIN 3.4*   No results for input(s): LIPASE, AMYLASE in the last 168 hours. No results for input(s): AMMONIA in the last 168 hours. Coagulation Profile: No results for input(s): INR, PROTIME in the last 168 hours. Cardiac Enzymes: No results for input(s): CKTOTAL, CKMB, CKMBINDEX, TROPONINI in the last 168 hours. BNP (last 3 results) No results for input(s): PROBNP in the last 8760 hours. HbA1C: No results for input(s): HGBA1C in the last 72 hours. CBG: Recent Labs  Lab 11/30/18 0343  GLUCAP 101*   Lipid Profile: No results for input(s): CHOL, HDL, LDLCALC, TRIG, CHOLHDL, LDLDIRECT in the last 72 hours. Thyroid Function Tests: No results for input(s): TSH, T4TOTAL, FREET4, T3FREE, THYROIDAB in the last 72 hours. Anemia Panel: No  results for input(s): VITAMINB12, FOLATE, FERRITIN, TIBC, IRON, RETICCTPCT in the last 72 hours. Sepsis Labs: Recent Labs  Lab 11/29/18 1749  LATICACIDVEN 0.8    Recent Results (from the past 240 hour(s))  SARS Coronavirus 2 (CEPHEID - Performed in [facility] hospital lab), Hosp Order     Status: None   Collection Time: 11/29/18 10:21 PM   Specimen: Nasopharyngeal Swab  Result Value Ref Range Status   SARS Coronavirus 2 NEGATIVE NEGATIVE Final    Comment: (NOTE) If result is NEGATIVE SARS-CoV-2 target nucleic acids are NOT DETECTED. The SARS-CoV-2 RNA is generally detectable in upper and lower  respiratory specimens during the acute phase of infection. The lowest  concentration of SARS-CoV-2 viral copies this assay can detect is 250  copies / mL. A negative result does not preclude SARS-CoV-2 infection  and should not be used as the sole basis for treatment or other  patient management decisions.  A negative result may occur with  improper specimen collection / handling, submission  of specimen other  than nasopharyngeal swab, presence of viral mutation(s) within the  areas targeted by this assay, and inadequate number of viral copies  (<250 copies / mL). A negative result must be combined with clinical  observations, patient history, and epidemiological information. If result is POSITIVE SARS-CoV-2 target nucleic acids are DETECTED. The SARS-CoV-2 RNA is generally detectable in upper and lower  respiratory specimens dur ing the acute phase of infection.  Positive  results are indicative of active infection with SARS-CoV-2.  Clinical  correlation with patient history and other diagnostic information is  necessary to determine patient infection status.  Positive results do  not rule out bacterial infection or co-infection with other viruses. If result is PRESUMPTIVE POSTIVE SARS-CoV-2 nucleic acids MAY BE PRESENT.   A presumptive positive result was obtained on the submitted  specimen  and confirmed on repeat testing.  While 2019 novel coronavirus  (SARS-CoV-2) nucleic acids may be present in the submitted sample  additional confirmatory testing may be necessary for epidemiological  and / or clinical management purposes  to differentiate between  SARS-CoV-2 and other Sarbecovirus currently known to infect humans.  If clinically indicated additional testing with an alternate test  methodology [phone]) is advised. The SARS-CoV-2 RNA is generally  detectable in upper and lower respiratory sp ecimens during the acute  phase of infection. The expected result is Negative. Fact Sheet for Patients:  StrictlyIdeas.no Fact Sheet for Healthcare Providers: BankingDealers.co.za This test is not yet approved or cleared by the [place] FDA and has been authorized for detection and/or diagnosis of SARS-CoV-2 by FDA under an Emergency Use Authorization (EUA).  This EUA will remain in effect (meaning this test can be used) for the duration of the COVID-19 declaration under Section 564(b)(1) of the Act, 21 U.S.C. section 360bbb-3(b)(1), unless the authorization is terminated or revoked sooner. Performed at [facility], [place] [address], [place], [place] [address]          Radiology Studies: Vas [place] Lower Extremity Venous (dvt)  Result Date: 11/30/2018  Lower Venous Study Indications: Swelling, Erythema, and Pain.  Limitations: Body habitus, poor ultrasound/tissue interface and pannus inhibiting access to groin and thigh. Performing Technologist: Maudry Mayhew MHA, RDMS, RVT, RDCS  Examination Guidelines: A complete evaluation includes B-mode imaging, spectral Doppler, color Doppler, and power Doppler as needed of all accessible portions of each vessel. Bilateral testing is considered an integral part of a complete examination. Limited examinations for reoccurring indications may be performed as noted.   +-----+---------------+---------+-----------+----------+--------------+  RIGHT Compressibility Phasicity Spontaneity Properties Summary         +-----+---------------+---------+-----------+----------+--------------+  CFV                                                    Not visualized  +-----+---------------+---------+-----------+----------+--------------+   +---------+---------------+---------+-----------+----------+-------------------+  LEFT      Compressibility Phasicity Spontaneity Properties Summary              +---------+---------------+---------+-----------+----------+-------------------+  CFV                                                        Not visualized       +---------+---------------+---------+-----------+----------+-------------------+  SFJ                                                        Not visualized       +---------+---------------+---------+-----------+----------+-------------------+  FV Prox                                                    Not visualized       +---------+---------------+---------+-----------+----------+-------------------+  FV Mid                                                     Not visualized       +---------+---------------+---------+-----------+----------+-------------------+  FV Distal Full                                                                  +---------+---------------+---------+-----------+----------+-------------------+  PFV                                                        Not visualized       +---------+---------------+---------+-----------+----------+-------------------+  POP                       Yes       Yes                    Unable to obtain                                                                 adequate position                                                                to perform                                                                       compression. Patent  by color Doppler.    +---------+---------------+---------+-----------+----------+-------------------+  PTV       Full                                             Limited                                                                          visualization        +---------+---------------+---------+-----------+----------+-------------------+  PERO                                                       Not visualized       +---------+---------------+---------+-----------+----------+-------------------+     Summary: Left: There is no evidence of deep vein thrombosis in the lower extremity. However, portions of this examination were limited- see technologist comments above. No cystic structure found in the popliteal fossa.  *See table(s) above for measurements and observations.    Preliminary         Scheduled Meds:  amLODipine  5 mg Oral QHS   DULoxetine  60 mg Oral Daily   enoxaparin (LOVENOX) injection  70 mg Subcutaneous Daily   fluticasone  1 spray Each Nare Daily   furosemide  20 mg Intravenous BID   gabapentin  600 mg Oral QHS   levothyroxine  225 mcg Oral QAC breakfast   lisdexamfetamine  60 mg Oral BH-q7a   lithium carbonate  300 mg Oral Daily   lithium carbonate  750 mg Oral QHS   mirabegron ER  50 mg Oral QHS   montelukast  10 mg Oral Daily   thiamine  100 mg Oral Daily   vitamin B-12  1,000 mcg Oral BID   Continuous Infusions:  piperacillin-tazobactam (ZOSYN)  IV     vancomycin 1,250 mg (11/30/18 1302)     LOS: 0 days    Time spent:35 min. More than 50% of that time was spent in counseling and/or coordination of care.      Shelly Coss, MD [facility] Pager [phone]  If 7PM-7AM, please contact night-coverage [email or web address] Password [email or web address] 11/30/2018, 1:07 PM

## 2018-11-30 NOTE — Evaluation (Signed)
Physical Therapy Evaluation Patient Details Name: SACHA TOPOR MRN: [id number] DOB: 1950/10/06 Today's Date: 11/30/2018   History of Present Illness  Pt is a 68 y/o female admitted secondary to LLE cellulitis. Imaging negative for DVT. PMH includes HTN, OSA on CPAP, bipolar disorders, obesity, and chronic respiratory failure.   Clinical Impression  Pt admitted secondary to problem above with deficits below. Pt very sleepy throughout session, even when sitting EOB. Requiring mod A to perform bed mobility tasks. Given pt's lethargy, unsafe to attempt further mobility. Pt did complain of increased LLE pain with movement. Anticipate pt will progress well once symptoms improve, however, will continue to follow acutely and update recommendations as able.     Follow Up Recommendations Other (comment)(SNF vs HHPT pending progression )    Equipment Recommendations  None recommended by PT    Recommendations for Other Services       Precautions / Restrictions Precautions Precautions: Fall Restrictions Weight Bearing Restrictions: No      Mobility  Bed Mobility Overal bed mobility: Needs Assistance Bed Mobility: Rolling;Sidelying to Sit;Sit to Sidelying Rolling: Min assist Sidelying to sit: Mod assist     Sit to sidelying: Mod assist General bed mobility comments: Mod A for trunk elevation and LE assist to come to sitting. Anticipated change in position would increase alertness, however, pt falling asleep at EOB and requiring at least min A to maintain sitting balance. Unsafe to attempt further mobility given level of alertness. Required mod A for return to sidelying. Pt wanting to stay on side for increased comfort.   Transfers                 General transfer comment: Unable secondary to lethargy.   Ambulation/Gait                Stairs            Wheelchair Mobility    Modified Rankin (Stroke Patients Only)       Balance Overall balance assessment:  Needs assistance Sitting-balance support: Bilateral upper extremity supported;Feet supported Sitting balance-Leahy Scale: Poor Sitting balance - Comments: Reliant on at least min A to maintain sitting balance given lethargy.                                      Pertinent Vitals/Pain Pain Assessment: Faces Faces Pain Scale: Hurts even more Pain Location: LLE Pain Descriptors / Indicators: Grimacing;Guarding Pain Intervention(s): Limited activity within patient's tolerance;Monitored during session;Repositioned    Home Living Family/patient expects to be discharged to:: Private residence Living Arrangements: Spouse/significant other Available Help at Discharge: Family;Available 24 hours/day Type of Home: House Home Access: Ramped entrance     Home Layout: One level Home Equipment: Walker - 4 wheels Additional Comments: Pt very lethargic. Had to ask home information multiple times.     Prior Function Level of Independence: Independent with assistive device(s)         Comments: Reports she was using rollator     Hand Dominance        Extremity/Trunk Assessment   Upper Extremity Assessment Upper Extremity Assessment: Generalized weakness    Lower Extremity Assessment Lower Extremity Assessment: Generalized weakness;LLE deficits/detail LLE Deficits / Details: LLE with redness and swelling from foot and ankle up to mid shin.     Cervical / Trunk Assessment Cervical / Trunk Assessment: Kyphotic  Communication   Communication: No difficulties  Cognition  Arousal/Alertness: Lethargic Behavior During Therapy: Flat affect Overall Cognitive Status: No family/caregiver present to determine baseline cognitive functioning                                 General Comments: Pt very sleepy throughout session. Pt falling asleep when asking questions and even falling asleep when attempting mobility.       General Comments      Exercises      Assessment/Plan    PT Assessment Patient needs continued PT services  PT Problem List Decreased strength;Decreased balance;Decreased activity tolerance;Decreased mobility;Decreased cognition;Decreased knowledge of use of DME;Decreased safety awareness;Decreased knowledge of precautions;Pain       PT Treatment Interventions DME instruction;Gait training;Functional mobility training;Therapeutic activities;Therapeutic exercise;Balance training;Patient/family education    PT Goals (Current goals can be found in the Care Plan section)  Acute Rehab PT Goals PT Goal Formulation: Patient unable to participate in goal setting Time For Goal Achievement: 12/14/18 Potential to Achieve Goals: Good    Frequency Min 3X/week   Barriers to discharge        Co-evaluation               AM-PAC PT "6 Clicks" Mobility  Outcome Measure Help needed turning from your back to your side while in a flat bed without using bedrails?: A Little Help needed moving from lying on your back to sitting on the side of a flat bed without using bedrails?: A Lot Help needed moving to and from a bed to a chair (including a wheelchair)?: A Lot Help needed standing up from a chair using your arms (e.g., wheelchair or bedside chair)?: A Lot Help needed to walk in hospital room?: A Lot Help needed climbing 3-5 steps with a railing? : Total 6 Click Score: 12    End of Session   Activity Tolerance: Patient limited by lethargy Patient left: in bed;with call bell/phone within reach;with bed alarm set Nurse Communication: Mobility status PT Visit Diagnosis: Other abnormalities of gait and mobility (R26.89);Difficulty in walking, not elsewhere classified (R26.2);Pain Pain - Right/Left: Left Pain - part of body: Leg    Time: 1536-1550 PT Time Calculation (min) (ACUTE ONLY): 14 min   Charges:   PT Evaluation $PT Eval Moderate Complexity: 1 Mod          Leighton Ruff, PT, DPT  Acute Rehabilitation  Services  Pager: [phone] Office: [phone]   Rudean Hitt 11/30/2018, 6:03 PM

## 2018-11-30 NOTE — Plan of Care (Signed)
  Problem: Health Behavior/Discharge Planning: Goal: Ability to manage health-related needs will improve Outcome: Progressing   Problem: Clinical Measurements: Goal: Will remain free from infection Outcome: Progressing   Problem: Activity: Goal: Risk for activity intolerance will decrease Outcome: Progressing   Problem: Nutrition: Goal: Adequate nutrition will be maintained Outcome: Progressing   Problem: Coping: Goal: Level of anxiety will decrease Outcome: Progressing   Problem: Elimination: Goal: Will not experience complications related to bowel motility Outcome: Progressing

## 2018-11-30 NOTE — ED Notes (Signed)
Waiting for IV team to replace IV before able to transport patient upstairs; RN attempted to start IV with no success-Monique,RN

## 2018-11-30 NOTE — ED Notes (Signed)
IV team to meet patient on the floor; Vancomycin to be restarted on the floor once IV team has replaced IV-Monique,RN

## 2018-11-30 NOTE — Progress Notes (Signed)
RT placed pt on CPAP dream station for the night on auto titrate max 15 min 10 with 3 LPM O2 bled into the system. Pt respiratory status stable at this time on CPAP dream station. RT will continue to monitor.

## 2018-11-30 NOTE — ED Notes (Signed)
ED TO INPATIENT HANDOFF REPORT  ED Nurse Name and Phone #: Gwyndolyn Saxon [phone]  S Name/Age/Gender Jessica [name] 68 y.o. female Room/Bed: 054C/054C  Code Status   Code Status: Full Code  Home/SNF/Other Home Patient oriented to: self, place, time and situation Is this baseline? Yes   Triage Complete: Triage complete  Chief Complaint Infection  Triage Note Pt has what she thinks is a insect bite that has became infected. Pt states she had two bites on her left lower leg 2 weeks ago over the last week it has became very swollen and red. Pt has edema in her left lower leg into her foot.    Allergies Allergies  Allergen Reactions  . Ibuprofen Diarrhea, Nausea Only and Other (See Comments)    Extreme stomach pain  Makes her feel bad  . Codeine     UNSPECIFIED REACTION   . Nabumetone     UNSPECIFIED REACTION   . Promethazine     UNSPECIFIED REACTION   . Amoxicillin-Pot Clavulanate Nausea And Vomiting  . Erythromycin Base Diarrhea  . Promethazine-Codeine Other (See Comments)    "Makes her feel bad"    Level of Care/Admitting Diagnosis ED Disposition    ED Disposition Condition [place]: [facility] [[id number]]  Level of Care: Med-Surg [16]  I expect the patient will be discharged within 24 hours: Yes  LOW acuity---Tx typically complete <24 hrs---ACUTE conditions typically can be evaluated <24 hours---LABS likely to return to acceptable levels <24 hours---IS near functional baseline---EXPECTED to return to current living arrangement---NOT newly hypoxic: Meets criteria for 5C-Observation unit  Covid Evaluation: Asymptomatic Screening Protocol (No Symptoms)  Diagnosis: Cellulitis [[id number]]  Admitting Physician: Shela Leff [[id number]]  Attending Physician: Shela Leff [[id number]]  PT Class (Do Not Modify): Observation [104]  PT Acc Code (Do Not Modify): Observation [10022]       B Medical/Surgery History Past  Medical History:  Diagnosis Date  . Arthritis    lower back, hands, knees  . Bipolar disorder ([place])   . Depression   . Dyspnea    with exertion  . GERD (gastroesophageal reflux disease)    patient unsure about this dx - no meds  . Headache   . History of IBS    watches diet  . Hyperlipidemia   . Hypertension   . Hypothyroidism   . Obese   . Plantar fasciitis   . Seasonal allergies   . Sleep apnea 2017   uses CPAP   . Spinal stenosis of lumbar region   . SVD (spontaneous vaginal delivery)    x 2  . Systemic lupus ([place])   . Thyroid disease    Past Surgical History:  Procedure Laterality Date  . CARPAL TUNNEL RELEASE Left 09/16/2017   Procedure: LEFT CARPAL TUNNEL RELEASE;  Surgeon: Daryll Brod, MD;  Location: [facility];  Service: Orthopedics;  Laterality: Left;  . carpel tunnel surgery Right   . CATARACT EXTRACTION Bilateral 2007   w/ lens implants  . COLONOSCOPY    . DILATION AND CURETTAGE OF UTERUS    . EYE SURGERY    . FOOT SURGERY Right    hammer toe  . FRACTURE SURGERY     R tibia and fibula  . LEG SURGERY Right   . TONSILLECTOMY    . WISDOM TOOTH EXTRACTION       A IV Location/Drains/Wounds Patient Lines/Drains/Airways Status   Active Line/Drains/Airways    Name:   Placement date:  Placement time:   Site:   Days:   Peripheral IV 11/29/18 Left Hand   11/29/18    2219    Hand   1   Incision (Closed) 09/16/17 Hand Left   09/16/17    1610     440          Intake/Output Last 24 hours  Intake/Output Summary (Last 24 hours) at 11/30/2018 0150 Last data filed at 11/30/2018 0029 Gross per 24 hour  Intake 103 ml  Output -  Net 103 ml    Labs/Imaging Results for orders placed or performed during the hospital encounter of 11/29/18 (from the past 48 hour(s))  Urinalysis, Routine w reflex microscopic     Status: Abnormal   Collection Time: 11/29/18  5:47 PM  Result Value Ref Range   Color, Urine STRAW (A) YELLOW   APPearance CLEAR CLEAR   Specific Gravity,  Urine 1.011 1.005 - 1.030   pH 6.0 5.0 - 8.0   Glucose, UA NEGATIVE NEGATIVE mg/dL   Hgb urine dipstick NEGATIVE NEGATIVE   Bilirubin Urine NEGATIVE NEGATIVE   Ketones, ur NEGATIVE NEGATIVE mg/dL   Protein, ur NEGATIVE NEGATIVE mg/dL   Nitrite NEGATIVE NEGATIVE   Leukocytes,Ua NEGATIVE NEGATIVE    Comment: Performed at [facility], [address] [place], [place] [address]  Lactic acid, plasma     Status: None   Collection Time: 11/29/18  5:49 PM  Result Value Ref Range   Lactic Acid, Venous 0.8 0.5 - 1.9 mmol/L    Comment: Performed at [facility] [address], [place], [place] [address]  Comprehensive metabolic panel     Status: Abnormal   Collection Time: 11/29/18  5:49 PM  Result Value Ref Range   Sodium 136 135 - 145 mmol/L   Potassium 4.3 3.5 - 5.1 mmol/L   Chloride 103 98 - 111 mmol/L   CO2 24 22 - 32 mmol/L   Glucose, Bld 140 (H) 70 - 99 mg/dL   BUN 17 8 - 23 mg/dL   Creatinine, Ser 1.04 (H) 0.44 - 1.00 mg/dL   Calcium 10.6 (H) 8.9 - 10.3 mg/dL   Total Protein 6.6 6.5 - 8.1 g/dL   Albumin 3.4 (L) 3.5 - 5.0 g/dL   AST 31 15 - 41 U/L   ALT 37 0 - 44 U/L   Alkaline Phosphatase 145 (H) 38 - 126 U/L   Total Bilirubin 0.6 0.3 - 1.2 mg/dL   GFR calc non Af Amer 55 (L) >60 mL/min   GFR calc Af Amer >60 >60 mL/min   Anion gap 9 5 - 15    Comment: Performed at [facility] [address], [place], [place] [address]  CBC with Differential     Status: Abnormal   Collection Time: 11/29/18  5:49 PM  Result Value Ref Range   WBC 15.0 (H) 4.0 - 10.5 K/uL   RBC 4.15 3.87 - 5.11 MIL/uL   Hemoglobin 13.0 12.0 - 15.0 g/dL   HCT 44.6 36.0 - 46.0 %   MCV 107.5 (H) 80.0 - 100.0 fL   MCH 31.3 26.0 - 34.0 pg   MCHC 29.1 (L) 30.0 - 36.0 g/dL   RDW 14.2 11.5 - 15.5 %   Platelets 301 150 - 400 K/uL   nRBC 0.0 0.0 - 0.2 %   Neutrophils Relative % 80 %   Neutro Abs 12.1 (H) 1.7 - 7.7 K/uL   Lymphocytes Relative 7 %   Lymphs Abs  1.1 0.7 - 4.0 K/uL   Monocytes  Relative 9 %   Monocytes Absolute 1.3 (H) 0.1 - 1.0 K/uL   Eosinophils Relative 1 %   Eosinophils Absolute 0.2 0.0 - 0.5 K/uL   Basophils Relative 1 %   Basophils Absolute 0.1 0.0 - 0.1 K/uL   Immature Granulocytes 2 %   Abs Immature Granulocytes 0.24 (H) 0.00 - 0.07 K/uL    Comment: Performed at [facility] [address] [place], [place] [address]  SARS Coronavirus 2 (CEPHEID - Performed in [facility] hospital lab), Hosp Order     Status: None   Collection Time: 11/29/18 10:21 PM   Specimen: Nasopharyngeal Swab  Result Value Ref Range   SARS Coronavirus 2 NEGATIVE NEGATIVE    Comment: (NOTE) If result is NEGATIVE SARS-CoV-2 target nucleic acids are NOT DETECTED. The SARS-CoV-2 RNA is generally detectable in upper and lower  respiratory specimens during the acute phase of infection. The lowest  concentration of SARS-CoV-2 viral copies this assay can detect is 250  copies / mL. A negative result does not preclude SARS-CoV-2 infection  and should not be used as the sole basis for treatment or other  patient management decisions.  A negative result may occur with  improper specimen collection / handling, submission of specimen other  than nasopharyngeal swab, presence of viral mutation(s) within the  areas targeted by this assay, and inadequate number of viral copies  (<250 copies / mL). A negative result must be combined with clinical  observations, patient history, and epidemiological information. If result is POSITIVE SARS-CoV-2 target nucleic acids are DETECTED. The SARS-CoV-2 RNA is generally detectable in upper and lower  respiratory specimens dur ing the acute phase of infection.  Positive  results are indicative of active infection with SARS-CoV-2.  Clinical  correlation with patient history and other diagnostic information is  necessary to determine patient infection status.  Positive results do  not rule out bacterial infection or co-infection with other  viruses. If result is PRESUMPTIVE POSTIVE SARS-CoV-2 nucleic acids MAY BE PRESENT.   A presumptive positive result was obtained on the submitted specimen  and confirmed on repeat testing.  While 2019 novel coronavirus  (SARS-CoV-2) nucleic acids may be present in the submitted sample  additional confirmatory testing may be necessary for epidemiological  and / or clinical management purposes  to differentiate between  SARS-CoV-2 and other Sarbecovirus currently known to infect humans.  If clinically indicated additional testing with an alternate test  methodology [phone]) is advised. The SARS-CoV-2 RNA is generally  detectable in upper and lower respiratory sp ecimens during the acute  phase of infection. The expected result is Negative. Fact Sheet for Patients:  StrictlyIdeas.no Fact Sheet for Healthcare Providers: BankingDealers.co.za This test is not yet approved or cleared by the [place] FDA and has been authorized for detection and/or diagnosis of SARS-CoV-2 by FDA under an Emergency Use Authorization (EUA).  This EUA will remain in effect (meaning this test can be used) for the duration of the COVID-19 declaration under Section 564(b)(1) of the Act, 21 U.S.C. section 360bbb-3(b)(1), unless the authorization is terminated or revoked sooner. Performed at [facility], [place] [address] [place], [place] [address]    No results found.  Pending Labs Unresulted Labs (From admission, onward)    Start     Ordered   11/30/18 0500  CBC  Tomorrow morning,   R     11/30/18 0012   11/30/18 0013  HIV antibody (Routine Testing)  Once,   STAT     11/30/18 0012          Vitals/Pain Today's Vitals   11/30/18 0000 11/30/18 0009 11/30/18 0031 11/30/18 0145  BP: (!) 123/100   (!) 158/71  Pulse: 88   90  Resp: (!) 33   19  Temp:      TempSrc:      SpO2: 99%   95%  PainSc:  Asleep Asleep     Isolation Precautions No active  isolations  Medications Medications  enoxaparin (LOVENOX) injection 40 mg (has no administration in time range)  acetaminophen (TYLENOL) tablet 650 mg (has no administration in time range)    Or  acetaminophen (TYLENOL) suppository 650 mg (has no administration in time range)  vancomycin (VANCOCIN) 2,000 mg in sodium chloride 0.9 % 500 mL IVPB (2,000 mg Intravenous New Bag/Given 11/30/18 0031)  vancomycin (VANCOCIN) 1,250 mg in sodium chloride 0.9 % 250 mL IVPB ( Intravenous Canceled Entry 11/30/18 0020)  sodium chloride flush (NS) 0.9 % injection 3 mL (3 mLs Intravenous Given 11/30/18 0029)    Mobility walks with person assist Low fall risk   Focused Assessments Cardiac Assessment Handoff:    Lab Results  Component Value Date   TROPONINI <0.30 01/23/2013   No results found for: DDIMER Does the Patient currently have chest pain? No     R Recommendations: See Admitting Provider Note  Report given to:   Additional Notes: patient is A&Ox4; Pt ambulates with one person assist due to swelling of left leg

## 2018-11-30 NOTE — Progress Notes (Signed)
Pharmacy Antibiotic Note  Jessica [name] is a 67 y.o. female admitted on 11/29/2018 with cellulitis.  Pharmacy has been consulted for Zosyn dosing.  CC/HPI:   Admitted with LLE cellulitis for Vancomycin/Zosyn from insect bite.  PMH: arthritis, bipolar disorder, depression, GERD, hypertension, HAs, hyperlipidemia, hypothyroidism, IBS, OSA, spinal stenosis, lupus chronic hypoxemic respiratory failure on home oxygen (2 L at rest and 4 L with activity/ sleep, morbid obeisty, plantar fascitis, OSA, Bilateral lower extremity edema, asthma  ID: Purulent cellulitis of left lower extremity. DRainage noted.  Afebrile. WBC 15>14.2. Scr 1.04. No AUC calculations noted by 3rd shift.  Vanco 7/30>> Zosyn 7/30>>  Plan: Vanco 1250mg  IV q 12hrs Zosyn 3.375g IV q 8 hrs. Increase Lovenox to 70mg /24h for BMI>30.       Temp (24hrs), Avg:98.3 F (36.8 C), Min:98 F (36.7 C), Max:98.8 F (37.1 C)  Recent Labs  Lab 11/29/18 1749 11/30/18 0657  WBC 15.0* 14.2*  CREATININE 1.04*  --   LATICACIDVEN 0.8  --     Estimated Creatinine Clearance: 71.2 mL/min (A) (by C-G formula based on SCr of 1.04 mg/dL (H)).    Allergies  Allergen Reactions  . Ibuprofen Diarrhea, Nausea Only and Other (See Comments)    Extreme stomach pain  Makes her feel bad  . Codeine     UNSPECIFIED REACTION   . Nabumetone     UNSPECIFIED REACTION   . Promethazine     UNSPECIFIED REACTION   . Amoxicillin-Pot Clavulanate Nausea And Vomiting  . Erythromycin Base Diarrhea  . Promethazine-Codeine Other (See Comments)    "Makes her feel bad"    [name] S. Alford Highland, PharmD, BCPS Clinical Staff Pharmacist  Eilene Ghazi Stillinger 11/30/2018 12:01 PM

## 2018-11-30 NOTE — Progress Notes (Signed)
Left lower extremity venous duplex completed. Refer to "CV Proc" under chart review to view preliminary results.  11/30/2018 10:44 AM Maudry Mayhew, MHA, RVT, RDCS, RDMS

## 2018-12-01 LAB — HIV ANTIBODY (ROUTINE TESTING W REFLEX): HIV Screen 4th Generation wRfx: NONREACTIVE

## 2018-12-01 MED ORDER — WHITE PETROLATUM EX OINT
TOPICAL_OINTMENT | CUTANEOUS | Status: AC
  Administered 2018-12-01: 20:00:00
  Filled 2018-12-01: qty 28.35

## 2018-12-01 MED ORDER — FUROSEMIDE 10 MG/ML IJ SOLN
60.0000 mg | Freq: Two times a day (BID) | INTRAMUSCULAR | Status: DC
  Administered 2018-12-01 – 2018-12-02 (×2): 60 mg via INTRAVENOUS
  Filled 2018-12-01 (×2): qty 6

## 2018-12-01 MED ORDER — NYSTATIN 100000 UNIT/GM EX POWD
Freq: Three times a day (TID) | CUTANEOUS | Status: DC
  Administered 2018-12-01 – 2018-12-03 (×7): via TOPICAL
  Filled 2018-12-01: qty 15

## 2018-12-01 NOTE — Progress Notes (Signed)
Physical Therapy Treatment Patient Details Name: Jessica [name] MRN: [id number] DOB: 1950/09/12 Today's Date: 12/01/2018    History of Present Illness Pt is a 68 y/o female admitted secondary to LLE cellulitis. Imaging negative for DVT. PMH includes HTN, OSA on CPAP, bipolar disorders, obesity, and chronic respiratory failure.     PT Comments    Pt performed gt training and functional mobility during session.  Pt much more alert and able to progress to gt training in room.  Pt continues to benefit skilled PT at d/c in post acute SNF to improve strength and function before returning home.  Pt remains limited in her functional capacity at this time.    Follow Up Recommendations  Home health PT;SNF(SnF vs. HHPT)     Equipment Recommendations  None recommended by PT    Recommendations for Other Services       Precautions / Restrictions Precautions Precautions: Fall Restrictions Weight Bearing Restrictions: No    Mobility  Bed Mobility Overal bed mobility: Needs Assistance Bed Mobility: Supine to Sit     Supine to sit: Min assist     General bed mobility comments: Cues for hand placement to achieve sitting edge of bed min assistance to elevate trunk into seated position.  Transfers Overall transfer level: Needs assistance Equipment used: Rolling walker (2 wheeled) Transfers: Sit to/from Stand Sit to Stand: Min assist         General transfer comment: Cues for hand placement to and from seated surface.  Pt tolerated standing well.  Ambulation/Gait Ambulation/Gait assistance: Min assist Gait Distance (Feet): 15 Feet Assistive device: Rolling walker (2 wheeled) Gait Pattern/deviations: Step-through pattern;Decreased stride length;Trunk flexed     General Gait Details: Pt with increased WOB and fatigues quickly.  Pt is slow and guarded and continues to have pain in LLE.   Stairs             Wheelchair Mobility    Modified Rankin (Stroke Patients  Only)       Balance Overall balance assessment: Needs assistance Sitting-balance support: Bilateral upper extremity supported;Feet supported Sitting balance-Leahy Scale: Poor Sitting balance - Comments: Reliant on at least min A to maintain sitting balance given lethargy.                                     Cognition Arousal/Alertness: Awake/alert Behavior During Therapy: WFL for tasks assessed/performed Overall Cognitive Status: Within Functional Limits for tasks assessed                                        Exercises      General Comments        Pertinent Vitals/Pain Pain Assessment: Faces Faces Pain Scale: Hurts even more Pain Location: LLE Pain Descriptors / Indicators: Grimacing;Guarding Pain Intervention(s): Limited activity within patient's tolerance;Monitored during session    Home Living                      Prior Function            PT Goals (current goals can now be found in the care plan section) Acute Rehab PT Goals PT Goal Formulation: With patient Potential to Achieve Goals: Good    Frequency    Min 3X/week      PT Plan Current plan remains appropriate  Co-evaluation              AM-PAC PT "6 Clicks" Mobility   Outcome Measure  Help needed turning from your back to your side while in a flat bed without using bedrails?: A Little Help needed moving from lying on your back to sitting on the side of a flat bed without using bedrails?: A Little Help needed moving to and from a bed to a chair (including a wheelchair)?: A Little Help needed standing up from a chair using your arms (e.g., wheelchair or bedside chair)?: A Little Help needed to walk in hospital room?: A Little Help needed climbing 3-5 steps with a railing? : A Little 6 Click Score: 18    End of Session Equipment Utilized During Treatment: Gait belt Activity Tolerance: Patient limited by lethargy Patient left: in bed;with call  bell/phone within reach;with bed alarm set Nurse Communication: Mobility status PT Visit Diagnosis: Other abnormalities of gait and mobility (R26.89);Difficulty in walking, not elsewhere classified (R26.2);Pain Pain - Right/Left: Left Pain - part of body: Leg     Time: 2952-8413 PT Time Calculation (min) (ACUTE ONLY): 17 min  Charges:  $Gait Training: 8-22 mins                     Governor Rooks, PTA Acute Rehabilitation Services Pager [phone] Office [phone]     Pedrohenrique [place] Eli Hose 12/01/2018, 5:38 PM

## 2018-12-01 NOTE — Plan of Care (Signed)

## 2018-12-01 NOTE — Progress Notes (Signed)
PROGRESS NOTE    Jessica [name]  PIR:[id number] DOB: March 26, 1951 DOA: 11/29/2018 PCP: Alroy Dust, L.Marlou Sa, MD   Brief Narrative:  Patient is a 68 year old female with history of arthritis, bipolar disorder, depression, GERD, hypertension, hyperlipidemia, hypothyroidism, OSA on CPAP, home oxygen, chronic hypoxic respiratory failure who presented to the emergency department for further evaluation of redness, swelling of left lower extremity after an insect bite.  Patient was admitted for the management of left lower extremity cellulitis with IV antibiotics.  Assessment & Plan:   Principal Problem:   Cellulitis Active Problems:   HTN (hypertension)   Hypothyroidism   OSA on CPAP   Chronic hypoxemic respiratory failure (HCC)   Lower extremity edema   Systemic lupus (HCC)   Obese   Bipolar disorder (HCC)   Cellulitis of left leg   Left lower extremity cellulitis: Presented with 2 weeks history of worsening erythema, edema after an insect bite. purulent drainage also noted.  Currently on vancomycin and Zosyn.  We will follow-up cultures. NGTD. Venous duplex did not show any DVT.  Had fever at home. Currently afebrile. Has mild leukocytosis .  Bilateral lower extremity edema: Echocardiogram has been ordered.  Needs to rule out CHF.  Currently on Lasix 40 mg IV twice a day.  On Lasix 20 mg daily at home.  She still has significant edema of the bilateral lower extremities today.  Continue Lasix 60 mg Iv BID.  Monitor BMP.  She is having good urine output.  OSA: Follows with pulmonology.  CPAP at night  Bipolar disorder: Continue her home meds  Hypertension: Currently Stable.  Continue current medicines  Hypothyroidism: Continue Synthyroid  Anxiety: Continue anxiolytics as needed  History of asthma: Currently stable.  No wheezing  Heard  Debility/deconditioning: PT evaluation done recommended home health versus skilled nursing facility.  Groin/abdomen candidiasis: Applying nystatin  Patient will be changed to inpatient.She requires IV antibiotics due to concerns for a rapidly progressive cellulitis and that PO antibiotics would not be a safe option at this time.          DVT prophylaxis: Lovenox Code Status: Full code Family Communication: Discussed with the patient Disposition Plan: Likely home after improvement in the cellulitis   Consultants: None  Procedures: None  Antimicrobials:  Anti-infectives (From admission, onward)   Start     Dose/Rate Route Frequency Ordered Stop   11/30/18 1400  piperacillin-tazobactam (ZOSYN) IVPB 3.375 g     3.375 g 12.5 mL/hr over 240 Minutes Intravenous Every 8 hours 11/30/18 1147     11/30/18 0015  vancomycin (VANCOCIN) 1,250 mg in sodium chloride 0.9 % 250 mL IVPB     1,250 mg 166.7 mL/hr over 90 Minutes Intravenous Every 12 hours 11/30/18 0008     11/30/18 0000  vancomycin (VANCOCIN) IVPB 1000 mg/200 mL premix  Status:  Discontinued     1,000 mg 200 mL/hr over 60 Minutes Intravenous  Once 11/29/18 2346 11/29/18 2356   11/30/18 0000  vancomycin (VANCOCIN) 2,000 mg in sodium chloride 0.9 % 500 mL IVPB     2,000 mg 250 mL/hr over 120 Minutes Intravenous  Once 11/29/18 2356 11/30/18 0231   11/29/18 2200  clindamycin (CLEOCIN) IVPB 600 mg  Status:  Discontinued     600 mg 100 mL/hr over 30 Minutes Intravenous Every 8 hours 11/29/18 2159 11/29/18 2346      Subjective:  Patient seen and examined the bedside this morning.  Hemodynamically stable.  Bilateral lower extremity edema looks slightly improved today.  Left  lower extremity erythema has significantly improved.  Patient feels more comfortable today.  Objective: Vitals:   11/30/18 2134 12/01/18 0500 12/01/18 0506 12/01/18 0810  BP:   (!) 148/88 (!) 153/81  Pulse: 84  98 (!) 110  Resp: 20  19 16   Temp:   98.1 F (36.7 C) 98.5 F (36.9 C)  TempSrc:   Oral Oral  SpO2: 97%  94% 98%  Weight:  (!) 144 kg      Intake/Output Summary (Last 24 hours) at 12/01/2018  1155 Last data filed at 12/01/2018 0810 Gross per 24 hour  Intake 840 ml  Output 4350 ml  Net -3510 ml   Filed Weights   11/30/18 0700 12/01/18 0500  Weight: (!) 144.4 kg (!) 144 kg    Examination:  General exam: Comfortable, obese  HEENT:Oral mucosa moist, Ear/Nose normal on gross exam Respiratory system: No wheezes or crackles Cardiovascular system: S1 & S2 heard, RRR. No JVD, murmurs, rubs, gallops or clicks. Gastrointestinal system: Abdomen is nondistended, soft and nontender.  Central nervous system: Alert and oriented. No focal neurological deficits. Extremities: Improving bilateral lower extremity edema, erythema on the left lower extremity has improved. Skin: Small skin breakdown on the left lower extremity   Data Reviewed: I have personally reviewed following labs and imaging studies  CBC: Recent Labs  Lab 11/29/18 1749 11/30/18 0657  WBC 15.0* 14.2*  NEUTROABS 12.1*  --   HGB 13.0 13.0  HCT 44.6 44.7  MCV 107.5* 106.4*  PLT 301 856   Basic Metabolic Panel: Recent Labs  Lab 11/29/18 1749  NA 136  K 4.3  CL 103  CO2 24  GLUCOSE 140*  BUN 17  CREATININE 1.04*  CALCIUM 10.6*   GFR: Estimated Creatinine Clearance: 70.7 mL/min (A) (by C-G formula based on SCr of 1.04 mg/dL (H)). Liver Function Tests: Recent Labs  Lab 11/29/18 1749  AST 31  ALT 37  ALKPHOS 145*  BILITOT 0.6  PROT 6.6  ALBUMIN 3.4*   No results for input(s): LIPASE, AMYLASE in the last 168 hours. No results for input(s): AMMONIA in the last 168 hours. Coagulation Profile: No results for input(s): INR, PROTIME in the last 168 hours. Cardiac Enzymes: No results for input(s): CKTOTAL, CKMB, CKMBINDEX, TROPONINI in the last 168 hours. BNP (last 3 results) No results for input(s): PROBNP in the last 8760 hours. HbA1C: No results for input(s): HGBA1C in the last 72 hours. CBG: Recent Labs  Lab 11/30/18 0343  GLUCAP 101*   Lipid Profile: No results for input(s): CHOL, HDL,  LDLCALC, TRIG, CHOLHDL, LDLDIRECT in the last 72 hours. Thyroid Function Tests: No results for input(s): TSH, T4TOTAL, FREET4, T3FREE, THYROIDAB in the last 72 hours. Anemia Panel: No results for input(s): VITAMINB12, FOLATE, FERRITIN, TIBC, IRON, RETICCTPCT in the last 72 hours. Sepsis Labs: Recent Labs  Lab 11/29/18 1749  LATICACIDVEN 0.8    Recent Results (from the past 240 hour(s))  SARS Coronavirus 2 (CEPHEID - Performed in [facility] hospital lab), Hosp Order     Status: None   Collection Time: 11/29/18 10:21 PM   Specimen: Nasopharyngeal Swab  Result Value Ref Range Status   SARS Coronavirus 2 NEGATIVE NEGATIVE Final    Comment: (NOTE) If result is NEGATIVE SARS-CoV-2 target nucleic acids are NOT DETECTED. The SARS-CoV-2 RNA is generally detectable in upper and lower  respiratory specimens during the acute phase of infection. The lowest  concentration of SARS-CoV-2 viral copies this assay can detect is 250  copies / mL.  A negative result does not preclude SARS-CoV-2 infection  and should not be used as the sole basis for treatment or other  patient management decisions.  A negative result may occur with  improper specimen collection / handling, submission of specimen other  than nasopharyngeal swab, presence of viral mutation(s) within the  areas targeted by this assay, and inadequate number of viral copies  (<250 copies / mL). A negative result must be combined with clinical  observations, patient history, and epidemiological information. If result is POSITIVE SARS-CoV-2 target nucleic acids are DETECTED. The SARS-CoV-2 RNA is generally detectable in upper and lower  respiratory specimens dur ing the acute phase of infection.  Positive  results are indicative of active infection with SARS-CoV-2.  Clinical  correlation with patient history and other diagnostic information is  necessary to determine patient infection status.  Positive results do  not rule out bacterial  infection or co-infection with other viruses. If result is PRESUMPTIVE POSTIVE SARS-CoV-2 nucleic acids MAY BE PRESENT.   A presumptive positive result was obtained on the submitted specimen  and confirmed on repeat testing.  While 2019 novel coronavirus  (SARS-CoV-2) nucleic acids may be present in the submitted sample  additional confirmatory testing may be necessary for epidemiological  and / or clinical management purposes  to differentiate between  SARS-CoV-2 and other Sarbecovirus currently known to infect humans.  If clinically indicated additional testing with an alternate test  methodology [phone]) is advised. The SARS-CoV-2 RNA is generally  detectable in upper and lower respiratory sp ecimens during the acute  phase of infection. The expected result is Negative. Fact Sheet for Patients:  StrictlyIdeas.no Fact Sheet for Healthcare Providers: BankingDealers.co.za This test is not yet approved or cleared by the [place] FDA and has been authorized for detection and/or diagnosis of SARS-CoV-2 by FDA under an Emergency Use Authorization (EUA).  This EUA will remain in effect (meaning this test can be used) for the duration of the COVID-19 declaration under Section 564(b)(1) of the Act, 21 U.S.C. section 360bbb-3(b)(1), unless the authorization is terminated or revoked sooner. Performed at [facility], [place] [address] [place], [place] [address]          Radiology Studies: Vas [place] Lower Extremity Venous (dvt)  Result Date: 11/30/2018  Lower Venous Study Indications: Swelling, Erythema, and Pain.  Limitations: Body habitus, poor ultrasound/tissue interface and pannus inhibiting access to groin and thigh. Performing Technologist: Maudry Mayhew MHA, RDMS, RVT, RDCS  Examination Guidelines: A complete evaluation includes B-mode imaging, spectral Doppler, color Doppler, and power Doppler as needed of all accessible  portions of each vessel. Bilateral testing is considered an integral part of a complete examination. Limited examinations for reoccurring indications may be performed as noted.  +-----+---------------+---------+-----------+----------+--------------+ RIGHTCompressibilityPhasicitySpontaneityPropertiesSummary        +-----+---------------+---------+-----------+----------+--------------+ CFV                                               Not visualized +-----+---------------+---------+-----------+----------+--------------+   +---------+---------------+---------+-----------+----------+-------------------+ LEFT     CompressibilityPhasicitySpontaneityPropertiesSummary             +---------+---------------+---------+-----------+----------+-------------------+ CFV  Not visualized      +---------+---------------+---------+-----------+----------+-------------------+ SFJ                                                   Not visualized      +---------+---------------+---------+-----------+----------+-------------------+ FV Prox                                               Not visualized      +---------+---------------+---------+-----------+----------+-------------------+ FV Mid                                                Not visualized      +---------+---------------+---------+-----------+----------+-------------------+ FV DistalFull                                                             +---------+---------------+---------+-----------+----------+-------------------+ PFV                                                   Not visualized      +---------+---------------+---------+-----------+----------+-------------------+ POP                     Yes      Yes                  Unable to obtain                                                          adequate position                                                          to perform                                                                compression. Patent                                                       by color Doppler.   +---------+---------------+---------+-----------+----------+-------------------+ PTV      Full  Limited                                                                   visualization       +---------+---------------+---------+-----------+----------+-------------------+ PERO                                                  Not visualized      +---------+---------------+---------+-----------+----------+-------------------+     Summary: Left: There is no evidence of deep vein thrombosis in the lower extremity. However, portions of this examination were limited- see technologist comments above. No cystic structure found in the popliteal fossa.  *See table(s) above for measurements and observations. Electronically signed by Monica Martinez MD on 11/30/2018 at 4:36:12 PM.    Final         Scheduled Meds: . amLODipine  5 mg Oral QHS  . DULoxetine  60 mg Oral Daily  . enoxaparin (LOVENOX) injection  70 mg Subcutaneous Daily  . fluticasone  1 spray Each Nare Daily  . furosemide  40 mg Intravenous BID  . gabapentin  600 mg Oral QHS  . levothyroxine  225 mcg Oral QAC breakfast  . lisdexamfetamine  60 mg Oral BH-q7a  . lithium carbonate  300 mg Oral Daily  . lithium carbonate  750 mg Oral QHS  . mirabegron ER  50 mg Oral QHS  . montelukast  10 mg Oral Daily  . nystatin   Topical TID  . thiamine  100 mg Oral Daily  . vitamin B-12  1,000 mcg Oral BID   Continuous Infusions: . piperacillin-tazobactam (ZOSYN)  IV 3.375 g (12/01/18 0617)  . vancomycin 1,250 mg (12/01/18 0027)     LOS: 1 day    Time spent:35 min. More than 50% of that time was spent in counseling and/or coordination of care.      Shelly Coss, MD [facility] Pager [phone]  If 7PM-7AM, please contact night-coverage [email or web address] Password [facility] 12/01/2018, 11:55 AM

## 2018-12-02 ENCOUNTER — Inpatient Hospital Stay (HOSPITAL_COMMUNITY): Payer: Medicare Other

## 2018-12-02 DIAGNOSIS — I361 Nonrheumatic tricuspid (valve) insufficiency: Secondary | ICD-10-CM

## 2018-12-02 LAB — CBC WITH DIFFERENTIAL/PLATELET
Abs Immature Granulocytes: 0.11 10*3/uL — ABNORMAL HIGH (ref 0.00–0.07)
Basophils Absolute: 0.1 10*3/uL (ref 0.0–0.1)
Basophils Relative: 1 %
Eosinophils Absolute: 0.4 10*3/uL (ref 0.0–0.5)
Eosinophils Relative: 2 %
HCT: 43.9 % (ref 36.0–46.0)
Hemoglobin: 12.9 g/dL (ref 12.0–15.0)
Immature Granulocytes: 1 %
Lymphocytes Relative: 10 %
Lymphs Abs: 1.4 10*3/uL (ref 0.7–4.0)
MCH: 30.6 pg (ref 26.0–34.0)
MCHC: 29.4 g/dL — ABNORMAL LOW (ref 30.0–36.0)
MCV: 104 fL — ABNORMAL HIGH (ref 80.0–100.0)
Monocytes Absolute: 1.8 10*3/uL — ABNORMAL HIGH (ref 0.1–1.0)
Monocytes Relative: 12 %
Neutro Abs: 11.2 10*3/uL — ABNORMAL HIGH (ref 1.7–7.7)
Neutrophils Relative %: 74 %
Platelets: 330 10*3/uL (ref 150–400)
RBC: 4.22 MIL/uL (ref 3.87–5.11)
RDW: 14 % (ref 11.5–15.5)
WBC: 15 10*3/uL — ABNORMAL HIGH (ref 4.0–10.5)
nRBC: 0 % (ref 0.0–0.2)

## 2018-12-02 LAB — BASIC METABOLIC PANEL
Anion gap: 10 (ref 5–15)
BUN: 13 mg/dL (ref 8–23)
CO2: 35 mmol/L — ABNORMAL HIGH (ref 22–32)
Calcium: 10.9 mg/dL — ABNORMAL HIGH (ref 8.9–10.3)
Chloride: 96 mmol/L — ABNORMAL LOW (ref 98–111)
Creatinine, Ser: 0.91 mg/dL (ref 0.44–1.00)
GFR calc Af Amer: >60 mL/min (ref >60)
GFR calc non Af Amer: >60 mL/min (ref >60)
Glucose, Bld: 129 mg/dL — ABNORMAL HIGH (ref 70–99)
Potassium: 4.3 mmol/L (ref 3.5–5.1)
Sodium: 141 mmol/L (ref 135–145)

## 2018-12-02 LAB — ECHOCARDIOGRAM COMPLETE: Weight: 5068.82 oz

## 2018-12-02 MED ORDER — FUROSEMIDE 10 MG/ML IJ SOLN
80.0000 mg | Freq: Two times a day (BID) | INTRAMUSCULAR | Status: DC
  Administered 2018-12-02 – 2018-12-03 (×2): 80 mg via INTRAVENOUS
  Filled 2018-12-02 (×2): qty 8

## 2018-12-02 MED ORDER — BISACODYL 5 MG PO TBEC: 5.0000 mg | DELAYED_RELEASE_TABLET | Freq: Every day | ORAL | Status: DC | PRN

## 2018-12-02 NOTE — Plan of Care (Signed)

## 2018-12-02 NOTE — Progress Notes (Signed)
RT offered pt CPAP dream station for the night and she does not like this mask that her home mask fits much differently. RT offered to try a smaller nasal mask and pt declined. RT expressed to pt that if she were to change her mind to have RN call. Pt respiratory status stable at this time on [place] 3 Lpm. RT will continue to monitor.

## 2018-12-02 NOTE — Progress Notes (Signed)
PROGRESS NOTE    Jessica [name]  UMP:[id number] DOB: October 07, 1950 DOA: 11/29/2018 PCP: Alroy Dust, L.Marlou Sa, MD   Brief Narrative:  Patient is a 68 year old female with history of arthritis, bipolar disorder, depression, GERD, hypertension, hyperlipidemia, hypothyroidism, OSA on CPAP, home oxygen, chronic hypoxic respiratory failure who presented to the emergency department for further evaluation of redness, swelling of left lower extremity after an insect bite.  Patient was admitted for the management of left lower extremity cellulitis with IV antibiotics.  Assessment & Plan:   Principal Problem:   Cellulitis Active Problems:   HTN (hypertension)   Hypothyroidism   OSA on CPAP   Chronic hypoxemic respiratory failure (HCC)   Lower extremity edema   Systemic lupus (HCC)   Obese   Bipolar disorder (HCC)   Cellulitis of left leg   Left lower extremity cellulitis: Presented with 2 weeks history of worsening erythema, edema after an insect bite. purulent drainage also noted.  Currently on vancomycin and Zosyn.  Cultures NGTD. Venous duplex did not show any DVT.  Had fever at home. Currently afebrile. Has mild leukocytosis .will continue vancomycin only.  Bilateral lower extremity edema/Diastolic CHF: Echocardiogram showed ejection fraction of 60 percent, impaired relaxation.  On Lasix 20 mg daily at home.  She still has significant edema of the bilateral lower extremities today.  Continue Lasix 80 mg Iv BID.  Monitor BMP.  She did not have great net output yesterday.  OSA: Follows with pulmonology.  CPAP at night  Bipolar disorder: Continue her home meds  Hypertension: Currently Stable.  Continue current medicines  Hypothyroidism: Continue Synthyroid  Anxiety: Continue anxiolytics as needed  History of asthma: Currently stable.  No wheezing  Heard  Debility/deconditioning: PT evaluation done recommended home health versus skilled nursing facility.  Groin/abdomen candidiasis:  Applying nystatin  Patient will be changed to inpatient.She requires IV antibiotics due to concerns for a rapidly progressive cellulitis and that PO antibiotics would not be a safe option at this time.          DVT prophylaxis: Lovenox Code Status: Full code Family Communication: Discussed with the patient Disposition Plan: Home tomorrow   Consultants: None  Procedures: None  Antimicrobials:  Anti-infectives (From admission, onward)   Start     Dose/Rate Route Frequency Ordered Stop   11/30/18 1400  piperacillin-tazobactam (ZOSYN) IVPB 3.375 g     3.375 g 12.5 mL/hr over 240 Minutes Intravenous Every 8 hours 11/30/18 1147     11/30/18 0015  vancomycin (VANCOCIN) 1,250 mg in sodium chloride 0.9 % 250 mL IVPB     1,250 mg 166.7 mL/hr over 90 Minutes Intravenous Every 12 hours 11/30/18 0008     11/30/18 0000  vancomycin (VANCOCIN) IVPB 1000 mg/200 mL premix  Status:  Discontinued     1,000 mg 200 mL/hr over 60 Minutes Intravenous  Once 11/29/18 2346 11/29/18 2356   11/30/18 0000  vancomycin (VANCOCIN) 2,000 mg in sodium chloride 0.9 % 500 mL IVPB     2,000 mg 250 mL/hr over 120 Minutes Intravenous  Once 11/29/18 2356 11/30/18 0231   11/29/18 2200  clindamycin (CLEOCIN) IVPB 600 mg  Status:  Discontinued     600 mg 100 mL/hr over 30 Minutes Intravenous Every 8 hours 11/29/18 2159 11/29/18 2346      Subjective:  Patient seen and examined the bedside this morning.  Remains hemodynamically stable.  She still has significant bilateral lower extremity edema.Needs more diuresis. Objective: Vitals:   12/01/18 2306 12/02/18 0500 12/02/18 0501 12/02/18  0700  BP:   (!) 143/96 (!) 141/82  Pulse: 88  96   Resp: 16  (!) 22 20  Temp:   97.6 F (36.4 C) 98.1 F (36.7 C)  TempSrc:   Oral Oral  SpO2: 93%  93% 92%  Weight:  (!) 143.7 kg      Intake/Output Summary (Last 24 hours) at 12/02/2018 1353 Last data filed at 12/02/2018 2992 Gross per 24 hour  Intake 2119.22 ml  Output 1800  ml  Net 319.22 ml   Filed Weights   11/30/18 0700 12/01/18 0500 12/02/18 0500  Weight: (!) 144.4 kg (!) 144 kg (!) 143.7 kg    Examination:  General exam: Comfortable, obese  HEENT:Oral mucosa moist, Ear/Nose normal on gross exam Respiratory system: No wheezes or crackles Cardiovascular system: S1 & S2 heard, RRR. No JVD, murmurs, rubs, gallops or clicks. Gastrointestinal system: Abdomen is nondistended, soft and nontender.  Central nervous system: Alert and oriented. No focal neurological deficits. Extremities: 2-3+ bilateral lower extremity edema, erythema on the left lower extremity has improved. Skin: Small skin breakdown on the left lower extremity   Data Reviewed: I have personally reviewed following labs and imaging studies  CBC: Recent Labs  Lab 11/29/18 1749 11/30/18 0657 12/02/18 0552  WBC 15.0* 14.2* 15.0*  NEUTROABS 12.1*  --  11.2*  HGB 13.0 13.0 12.9  HCT 44.6 44.7 43.9  MCV 107.5* 106.4* 104.0*  PLT 301 253 426   Basic Metabolic Panel: Recent Labs  Lab 11/29/18 1749 12/02/18 0552  NA 136 141  K 4.3 4.3  CL 103 96*  CO2 24 35*  GLUCOSE 140* 129*  BUN 17 13  CREATININE 1.04* 0.91  CALCIUM 10.6* 10.9*   GFR: Estimated Creatinine Clearance: 80.6 mL/min (by C-G formula based on SCr of 0.91 mg/dL). Liver Function Tests: Recent Labs  Lab 11/29/18 1749  AST 31  ALT 37  ALKPHOS 145*  BILITOT 0.6  PROT 6.6  ALBUMIN 3.4*   No results for input(s): LIPASE, AMYLASE in the last 168 hours. No results for input(s): AMMONIA in the last 168 hours. Coagulation Profile: No results for input(s): INR, PROTIME in the last 168 hours. Cardiac Enzymes: No results for input(s): CKTOTAL, CKMB, CKMBINDEX, TROPONINI in the last 168 hours. BNP (last 3 results) No results for input(s): PROBNP in the last 8760 hours. HbA1C: No results for input(s): HGBA1C in the last 72 hours. CBG: Recent Labs  Lab 11/30/18 0343  GLUCAP 101*   Lipid Profile: No results for  input(s): CHOL, HDL, LDLCALC, TRIG, CHOLHDL, LDLDIRECT in the last 72 hours. Thyroid Function Tests: No results for input(s): TSH, T4TOTAL, FREET4, T3FREE, THYROIDAB in the last 72 hours. Anemia Panel: No results for input(s): VITAMINB12, FOLATE, FERRITIN, TIBC, IRON, RETICCTPCT in the last 72 hours. Sepsis Labs: Recent Labs  Lab 11/29/18 1749  LATICACIDVEN 0.8    Recent Results (from the past 240 hour(s))  SARS Coronavirus 2 (CEPHEID - Performed in [facility] hospital lab), Hosp Order     Status: None   Collection Time: 11/29/18 10:21 PM   Specimen: Nasopharyngeal Swab  Result Value Ref Range Status   SARS Coronavirus 2 NEGATIVE NEGATIVE Final    Comment: (NOTE) If result is NEGATIVE SARS-CoV-2 target nucleic acids are NOT DETECTED. The SARS-CoV-2 RNA is generally detectable in upper and lower  respiratory specimens during the acute phase of infection. The lowest  concentration of SARS-CoV-2 viral copies this assay can detect is 250  copies / mL. A negative result  does not preclude SARS-CoV-2 infection  and should not be used as the sole basis for treatment or other  patient management decisions.  A negative result may occur with  improper specimen collection / handling, submission of specimen other  than nasopharyngeal swab, presence of viral mutation(s) within the  areas targeted by this assay, and inadequate number of viral copies  (<250 copies / mL). A negative result must be combined with clinical  observations, patient history, and epidemiological information. If result is POSITIVE SARS-CoV-2 target nucleic acids are DETECTED. The SARS-CoV-2 RNA is generally detectable in upper and lower  respiratory specimens dur ing the acute phase of infection.  Positive  results are indicative of active infection with SARS-CoV-2.  Clinical  correlation with patient history and other diagnostic information is  necessary to determine patient infection status.  Positive results do   not rule out bacterial infection or co-infection with other viruses. If result is PRESUMPTIVE POSTIVE SARS-CoV-2 nucleic acids MAY BE PRESENT.   A presumptive positive result was obtained on the submitted specimen  and confirmed on repeat testing.  While 2019 novel coronavirus  (SARS-CoV-2) nucleic acids may be present in the submitted sample  additional confirmatory testing may be necessary for epidemiological  and / or clinical management purposes  to differentiate between  SARS-CoV-2 and other Sarbecovirus currently known to infect humans.  If clinically indicated additional testing with an alternate test  methodology [phone]) is advised. The SARS-CoV-2 RNA is generally  detectable in upper and lower respiratory sp ecimens during the acute  phase of infection. The expected result is Negative. Fact Sheet for Patients:  StrictlyIdeas.no Fact Sheet for Healthcare Providers: BankingDealers.co.za This test is not yet approved or cleared by the [place] FDA and has been authorized for detection and/or diagnosis of SARS-CoV-2 by FDA under an Emergency Use Authorization (EUA).  This EUA will remain in effect (meaning this test can be used) for the duration of the COVID-19 declaration under Section 564(b)(1) of the Act, 21 U.S.C. section 360bbb-3(b)(1), unless the authorization is terminated or revoked sooner. Performed at [facility], [place] [address], [place], [place] [address]   Culture, blood (routine x 2)     Status: None (Preliminary result)   Collection Time: 11/30/18  2:43 PM   Specimen: BLOOD  Result Value Ref Range Status   Specimen Description BLOOD LEFT ANTECUBITAL  Final   Special Requests   Final    BOTTLES DRAWN AEROBIC AND ANAEROBIC Blood Culture adequate volume   Culture   Final    NO GROWTH 2 DAYS Performed at [facility], [place] [address] [place], [place] [address]    Report Status PENDING  Incomplete   Culture, blood (routine x 2)     Status: None (Preliminary result)   Collection Time: 11/30/18  2:52 PM   Specimen: BLOOD LEFT HAND  Result Value Ref Range Status   Specimen Description BLOOD LEFT HAND  Final   Special Requests   Final    BOTTLES DRAWN AEROBIC AND ANAEROBIC Blood Culture results may not be optimal due to an inadequate volume of blood received in culture bottles   Culture   Final    NO GROWTH 2 DAYS Performed at [facility], [place] [address], [place], [place] [address]    Report Status PENDING  Incomplete         Radiology Studies: No results found.      Scheduled Meds: . amLODipine  5 mg Oral  QHS  . DULoxetine  60 mg Oral Daily  . enoxaparin (LOVENOX) injection  70 mg Subcutaneous Daily  . fluticasone  1 spray Each Nare Daily  . furosemide  60 mg Intravenous BID  . gabapentin  600 mg Oral QHS  . levothyroxine  225 mcg Oral QAC breakfast  . lisdexamfetamine  60 mg Oral BH-q7a  . lithium carbonate  300 mg Oral Daily  . lithium carbonate  750 mg Oral QHS  . mirabegron ER  50 mg Oral QHS  . montelukast  10 mg Oral Daily  . nystatin   Topical TID  . thiamine  100 mg Oral Daily  . vitamin B-12  1,000 mcg Oral BID   Continuous Infusions: . piperacillin-tazobactam (ZOSYN)  IV 3.375 g (12/02/18 0658)  . vancomycin 1,250 mg (12/02/18 1247)     LOS: 2 days    Time spent:35 min. More than 50% of that time was spent in counseling and/or coordination of care.      Shelly Coss, MD [facility] Pager [phone]  If 7PM-7AM, please contact night-coverage [email or web address] Password [email or web address] 12/02/2018, 1:53 PM

## 2018-12-02 NOTE — Progress Notes (Signed)
  Echocardiogram 2D Echocardiogram has been performed.  Jessica [name] 12/02/2018, 11:58 AM

## 2018-12-02 NOTE — Plan of Care (Signed)
  Problem: Activity: °Goal: Risk for activity intolerance will decrease °Outcome: Progressing °  °Problem: Nutrition: °Goal: Adequate nutrition will be maintained °Outcome: Progressing °  °Problem: Coping: °Goal: Level of anxiety will decrease °Outcome: Progressing °  °Problem: Elimination: °Goal: Will not experience complications related to bowel motility °Outcome: Progressing °  °Problem: Skin Integrity: °Goal: Risk for impaired skin integrity will decrease °Outcome: Progressing °  °

## 2018-12-03 LAB — CBC WITH DIFFERENTIAL/PLATELET
Abs Immature Granulocytes: 0.17 10*3/uL — ABNORMAL HIGH (ref 0.00–0.07)
Basophils Absolute: 0.1 10*3/uL (ref 0.0–0.1)
Basophils Relative: 0 %
Eosinophils Absolute: 0.3 10*3/uL (ref 0.0–0.5)
Eosinophils Relative: 2 %
HCT: 46.8 % — ABNORMAL HIGH (ref 36.0–46.0)
Hemoglobin: 13.9 g/dL (ref 12.0–15.0)
Immature Granulocytes: 1 %
Lymphocytes Relative: 7 %
Lymphs Abs: 1 10*3/uL (ref 0.7–4.0)
MCH: 31 pg (ref 26.0–34.0)
MCHC: 29.7 g/dL — ABNORMAL LOW (ref 30.0–36.0)
MCV: 104.2 fL — ABNORMAL HIGH (ref 80.0–100.0)
Monocytes Absolute: 1.5 10*3/uL — ABNORMAL HIGH (ref 0.1–1.0)
Monocytes Relative: 9 %
Neutro Abs: 12.7 10*3/uL — ABNORMAL HIGH (ref 1.7–7.7)
Neutrophils Relative %: 81 %
Platelets: 337 10*3/uL (ref 150–400)
RBC: 4.49 MIL/uL (ref 3.87–5.11)
RDW: 13.7 % (ref 11.5–15.5)
WBC: 15.7 10*3/uL — ABNORMAL HIGH (ref 4.0–10.5)
nRBC: 0 % (ref 0.0–0.2)

## 2018-12-03 LAB — BASIC METABOLIC PANEL
Anion gap: 11 (ref 5–15)
BUN: 13 mg/dL (ref 8–23)
CO2: 33 mmol/L — ABNORMAL HIGH (ref 22–32)
Calcium: 10.9 mg/dL — ABNORMAL HIGH (ref 8.9–10.3)
Chloride: 97 mmol/L — ABNORMAL LOW (ref 98–111)
Creatinine, Ser: 0.87 mg/dL (ref 0.44–1.00)
GFR calc Af Amer: >60 mL/min (ref >60)
GFR calc non Af Amer: >60 mL/min (ref >60)
Glucose, Bld: 154 mg/dL — ABNORMAL HIGH (ref 70–99)
Potassium: 3.7 mmol/L (ref 3.5–5.1)
Sodium: 141 mmol/L (ref 135–145)

## 2018-12-03 MED ORDER — POTASSIUM CHLORIDE ER 20 MEQ PO TBCR: 20.0000 meq | EXTENDED_RELEASE_TABLET | Freq: Every day | ORAL | 0 refills | Status: DC

## 2018-12-03 MED ORDER — FUROSEMIDE 40 MG PO TABS: ORAL_TABLET | ORAL | 1 refills | Status: DC

## 2018-12-03 MED ORDER — POTASSIUM CHLORIDE 20 MEQ PO PACK: 20.0000 meq | PACK | Freq: Every day | ORAL | 0 refills | Status: DC

## 2018-12-03 MED ORDER — NYSTATIN 100000 UNIT/GM EX POWD: Freq: Two times a day (BID) | CUTANEOUS | 0 refills | Status: DC

## 2018-12-03 MED ORDER — DOXYCYCLINE MONOHYDRATE 100 MG PO TABS: 100.0000 mg | ORAL_TABLET | Freq: Two times a day (BID) | ORAL | 0 refills | Status: DC

## 2018-12-03 NOTE — TOC Transition Note (Signed)
Transition of Care [facility]) - CM/SW Discharge Note   Patient Details  Name: Jessica [name] MRN: [id number] Date of Birth: 05-Nov-1950  Transition of Care [facility]) CM/SW Contact:  Claudie Leach, RN Phone Number: 12/03/2018, 12:01 PM   Clinical Narrative:    Pt to d/c home with husband and HH.  Patient has O2 and DME 3n1 and rollator at home.  No other DME needed per patient. No wound care needs at this time.  Referral accepted by Sharmon Revere with [facility].    Final next level of care: [place] Barriers to Discharge: No Barriers Identified   Patient Goals and CMS Choice Patient states their goals for this hospitalization and ongoing recovery are:: to get back home CMS Medicare.gov Compare Post Acute Care list provided to:: Patient Choice offered to / list presented to : Patient  Discharge Plan and Services      HH Arranged: PT, RN [facility] Agency: [place] Date [place]: 12/03/18 Time [place]: 0211 Representative spoke with at [place]: Sharmon Revere

## 2018-12-03 NOTE — Plan of Care (Signed)
  Problem: Education: Goal: Knowledge of General Education information will improve Description: Including pain rating scale, medication(s)/side effects and non-pharmacologic comfort measures Outcome: Progressing   Problem: Pain Managment: Goal: General experience of comfort will improve Outcome: Progressing   Problem: Safety: Goal: Ability to remain free from injury will improve Outcome: Progressing   

## 2018-12-03 NOTE — Discharge Summary (Signed)
Physician Discharge Summary  Jessica [name] QBH:[id number] DOB: 12-03-1950 DOA: 11/29/2018  PCP: Alroy Dust, L.Marlou Sa, MD  Admit date: 11/29/2018 Discharge date: 12/03/2018  Admitted From: Home Disposition:  Home  Discharge Condition:Stable CODE STATUS:FULL Diet recommendation: Heart Healthy   Brief/Interim Summary: Patient is a 68 year old female with history of arthritis, bipolar disorder, depression, GERD, hypertension, hyperlipidemia, hypothyroidism, OSA on CPAP, home oxygen, chronic hypoxic respiratory failure who presented to the emergency department for further evaluation of redness, swelling of left lower extremity after an insect bite.  Patient was admitted for the management of left lower extremity cellulitis with IV antibiotics.  Patient was also started on IV diuresis for bilateral lower extremity edema.  Echocardiogram suggestive of diastolic CHF.  Her bilateral lower extremity edema has improved.  Cellulitis has improved with IV antibiotics.  Antibiotics changed to oral today.  She will be discharged on oral Lasix.  Seen by physical therapy and recommended home health  on discharge.  Following problems were addressed during her hospitalization:  Left lower extremity cellulitis: Presented with 2 weeks history of worsening erythema, edema after an insect bite. purulent drainage also noted. Started on on vancomycin and Zosyn.  Cultures NGTD. Venous duplex did not show any DVT.  Had fever at home. Currently afebrile. Has mild leukocytosis .Abx changed to oral on discharge.  Bilateral lower extremity edema/Diastolic CHF: Echocardiogram showed ejection fraction of 60 percent, impaired relaxation.  On Lasix 20 mg daily at home.  She still has significant edema of the bilateral lower extremities .  Continue lasix 40 mg BID at home for a week followed by 40 mg daily.  OSA: Follows with pulmonology.  CPAP at night  Bipolar disorder: Continue her home meds  Hypertension: Currently Stable.   Continue current medicines  Hypothyroidism: Continue Synthyroid  Anxiety: Continue anxiolytics as needed  History of asthma: Currently stable.  No wheezing  heard  Debility/deconditioning: PT evaluation done recommended home health .  Groin/abdomen candidiasis: Applying nystatin  Discharge Diagnoses:  Principal Problem:   Cellulitis Active Problems:   HTN (hypertension)   Hypothyroidism   OSA on CPAP   Chronic hypoxemic respiratory failure (HCC)   Lower extremity edema   Systemic lupus (HCC)   Obese   Bipolar disorder (HCC)   Cellulitis of left leg    Discharge Instructions  Discharge Instructions    Diet - low sodium heart healthy   Complete by: As directed    Discharge instructions   Complete by: As directed    1)Please take prescribed medications as instructed. 2)Follow up with your PCP in a week. Do CBC and BMP tests during the follow up. 3)Follow up with home health services. 4)Restrict water intake to less than 1.5 litres a day.Decrease salt intake to less than 2 gms a day   Increase activity slowly   Complete by: As directed      Allergies as of 12/03/2018      Reactions   Ibuprofen Diarrhea, Nausea Only, Other (See Comments)   Extreme stomach pain  Makes her feel bad   Codeine    UNSPECIFIED REACTION    Nabumetone    UNSPECIFIED REACTION    Promethazine    UNSPECIFIED REACTION    Amoxicillin-pot Clavulanate Nausea And Vomiting   Erythromycin Base Diarrhea   Promethazine-codeine Other (See Comments)   "Makes her feel bad"      Medication List    STOP taking these medications   cephALEXin 500 MG capsule Commonly known as: KEFLEX  TAKE these medications   albuterol 108 (90 Base) MCG/ACT inhaler Commonly known as: VENTOLIN HFA Inhale 2 puffs into the lungs every 6 (six) hours as needed for wheezing or shortness of breath.   amLODipine 5 MG tablet Commonly known as: NORVASC Take 5 mg at bedtime by mouth.   celecoxib 200 MG  capsule Commonly known as: CELEBREX TAKE 1 CAPSULE BY MOUTH EVERY DAY What changed: how much to take   clotrimazole-betamethasone cream Commonly known as: LOTRISONE Apply 1 application topically 2 (two) times daily as needed (rash).   doxycycline 100 MG tablet Commonly known as: ADOXA Take 1 tablet (100 mg total) by mouth 2 (two) times daily for 4 days.   DULoxetine 60 MG capsule Commonly known as: CYMBALTA TAKE 1 CAPSULE BY MOUTH EVERY DAY IN THE MORNING What changed: See the new instructions.   econazole nitrate 1 % cream Apply 1 application topically 3 (three) times daily as needed (fungus).   fluticasone 50 MCG/ACT nasal spray Commonly known as: FLONASE Place 1 spray into both nostrils daily.   furosemide 40 MG tablet Commonly known as: Lasix Take two pills twice a day for a week then continue taking one pill a day What changed:   medication strength  how much to take  how to take this  when to take this  additional instructions   gabapentin 300 MG capsule Commonly known as: NEURONTIN Take 600 mg by mouth at bedtime.   HYDROcodone-acetaminophen 5-325 MG tablet Commonly known as: Norco Take 1 tablet by mouth every 6 (six) hours as needed for moderate pain.   ketoconazole 2 % cream Commonly known as: NIZORAL Apply 1 application topically 3 (three) times daily as needed for irritation.   lisdexamfetamine 60 MG capsule Commonly known as: Vyvanse Take 1 capsule (60 mg total) by mouth every morning.   lithium carbonate 300 MG CR tablet Commonly known as: LITHOBID 300mg  in the AM and 600mg  (take an additional 150mg  capsule)at bedtime. What changed: Another medication with the same name was changed. Make sure you understand how and when to take each.   lithium carbonate 150 MG capsule TAKE 1 CAPSULE BY MOUTH AT BEDTIME. TAKE WITH 600MG  LITHIUM CARBONATE AT BEDTIME TO EQUAL 750MG  What changed: See the new instructions.   loratadine 10 MG tablet Commonly  known as: CLARITIN Take 10 mg by mouth daily as needed for allergies.   LORazepam 0.5 MG tablet Commonly known as: ATIVAN Take 0.5 mg by mouth every 6 (six) hours as needed for anxiety.   methocarbamol 500 MG tablet Commonly known as: ROBAXIN TAKE 1/2 TO 1 TABLET BY MOUTH UP TO 3 TIMES DAILY AS NEEDED FOR MUSCLE SPASMS What changed: See the new instructions.   mirabegron ER 50 MG Tb24 tablet Commonly known as: MYRBETRIQ Take 50 mg at bedtime by mouth.   montelukast 10 MG tablet Commonly known as: SINGULAIR Take 1 tablet by mouth daily.   PreserVision AREDS 2 Caps Take 1 capsule 2 (two) times daily by mouth.   multivitamin with minerals tablet Take 1 tablet by mouth daily.   MURO 128 OP Place 1 drop into both eyes 3 (three) times daily.   NAC 600 MG Caps Generic drug: Acetylcysteine Take 600 mg by mouth 2 (two) times daily.   nystatin powder Commonly known as: MYCOSTATIN/NYSTOP Apply topically 2 (two) times daily for 7 days.   Potassium Chloride ER 20 MEQ Tbcr Take 20 mEq by mouth daily.   pravastatin 20 MG tablet Commonly known as:  PRAVACHOL Take 20 mg at bedtime by mouth.   REFRESH OPTIVE OP Place 1-2 drops into both eyes daily as needed (dry eyes).   Restasis 0.05 % ophthalmic emulsion Generic drug: cycloSPORINE Place 1 drop into both eyes 2 (two) times daily.   Synthroid 25 MCG tablet Generic drug: levothyroxine Take 225 mcg by mouth daily before breakfast.   levothyroxine 200 MCG tablet Commonly known as: SYNTHROID Take 225 mcg by mouth daily before breakfast.   Tylenol Arthritis Pain 650 MG CR tablet Generic drug: acetaminophen Take 1,300 mg by mouth 3 (three) times daily.   valsartan 80 MG tablet Commonly known as: DIOVAN Take 80 mg by mouth daily.   vitamin B-12 100 MCG tablet Commonly known as: CYANOCOBALAMIN Take 100 mcg by mouth 2 (two) times daily.   Vitamin B1 100 MG Tabs Take 1 tablet by mouth daily.   Vitamin D (Ergocalciferol)  1.25 MG (50000 UT) Caps capsule Commonly known as: DRISDOL Take 50,000 Units by mouth every 7 (seven) days.      Follow-up Information    Alroy Dust, L.Marlou Sa, MD. Schedule an appointment as soon as possible for a visit in 1 week(s).   Specialty: Family Medicine Contact information: [address] [place] [address] [phone]          Allergies  Allergen Reactions  . Ibuprofen Diarrhea, Nausea Only and Other (See Comments)    Extreme stomach pain  Makes her feel bad  . Codeine     UNSPECIFIED REACTION   . Nabumetone     UNSPECIFIED REACTION   . Promethazine     UNSPECIFIED REACTION   . Amoxicillin-Pot Clavulanate Nausea And Vomiting  . Erythromycin Base Diarrhea  . Promethazine-Codeine Other (See Comments)    "Makes her feel bad"    Consultations: None  Procedures/Studies: Dg Chest 2 View  Result Date: 11/17/2018 CLINICAL DATA:  Pt c/o hypoxia x 3 weeks. Hx of dyspnea, HTN, systemic lupus. No to all COVID questions. EXAM: CHEST - 2 VIEW COMPARISON:  04/14/2015 FINDINGS: Mild enlargement of the cardiopericardial silhouette. No mediastinal or hilar masses. No evidence of adenopathy. Lungs are hyperexpanded. There prominent bronchovascular markings most evident lung bases. No evidence of pneumonia or pulmonary edema. No pleural effusion or pneumothorax. Skeletal structures are intact. IMPRESSION: 1. No acute cardiopulmonary disease. 2. Mild cardiomegaly, which appears increased in size prior exams. Hyperexpanded lungs prominent bronchovascular markings, but no pulmonary edema. Electronically Signed   By: Lajean Manes M.D.   On: 11/17/2018 09:40   Vas [place] Lower Extremity Venous (dvt)  Result Date: 11/30/2018  Lower Venous Study Indications: Swelling, Erythema, and Pain.  Limitations: Body habitus, poor ultrasound/tissue interface and pannus inhibiting access to groin and thigh. Performing Technologist: Maudry Mayhew MHA, RDMS, RVT, RDCS  Examination  Guidelines: A complete evaluation includes B-mode imaging, spectral Doppler, color Doppler, and power Doppler as needed of all accessible portions of each vessel. Bilateral testing is considered an integral part of a complete examination. Limited examinations for reoccurring indications may be performed as noted.  +-----+---------------+---------+-----------+----------+--------------+ RIGHTCompressibilityPhasicitySpontaneityPropertiesSummary        +-----+---------------+---------+-----------+----------+--------------+ CFV                                               Not visualized +-----+---------------+---------+-----------+----------+--------------+   +---------+---------------+---------+-----------+----------+-------------------+ LEFT     CompressibilityPhasicitySpontaneityPropertiesSummary             +---------+---------------+---------+-----------+----------+-------------------+  CFV                                                   Not visualized      +---------+---------------+---------+-----------+----------+-------------------+ SFJ                                                   Not visualized      +---------+---------------+---------+-----------+----------+-------------------+ FV Prox                                               Not visualized      +---------+---------------+---------+-----------+----------+-------------------+ FV Mid                                                Not visualized      +---------+---------------+---------+-----------+----------+-------------------+ FV DistalFull                                                             +---------+---------------+---------+-----------+----------+-------------------+ PFV                                                   Not visualized      +---------+---------------+---------+-----------+----------+-------------------+ POP                     Yes      Yes                   Unable to obtain                                                          adequate position                                                         to perform                                                                compression. Patent  by color Doppler.   +---------+---------------+---------+-----------+----------+-------------------+ PTV      Full                                         Limited                                                                   visualization       +---------+---------------+---------+-----------+----------+-------------------+ PERO                                                  Not visualized      +---------+---------------+---------+-----------+----------+-------------------+     Summary: Left: There is no evidence of deep vein thrombosis in the lower extremity. However, portions of this examination were limited- see technologist comments above. No cystic structure found in the popliteal fossa.  *See table(s) above for measurements and observations. Electronically signed by Monica Martinez MD on 11/30/2018 at 4:36:12 PM.    Final       Subjective:  Patient seen and examined at the bed side this morning. Hemodynamically stable for discharge.  Discharge Exam: Vitals:   12/03/18 0404 12/03/18 0944  BP: 139/74 138/85  Pulse: (!) 103 (!) 105  Resp: 14 17  Temp: 98.2 F (36.8 C) 98.2 F (36.8 C)  SpO2: 91% 94%   Vitals:   12/02/18 1953 12/03/18 0404 12/03/18 0500 12/03/18 0944  BP: 139/81 139/74  138/85  Pulse: 91 (!) 103  (!) 105  Resp: 14 14  17   Temp: 98.1 F (36.7 C) 98.2 F (36.8 C)  98.2 F (36.8 C)  TempSrc: Oral Oral  Oral  SpO2:  91%  94%  Weight:   (!) 143 kg     General: Pt is alert, awake, not in acute distress,obese Cardiovascular: RRR, S1/S2 +, no rubs, no gallops Respiratory: CTA bilaterally, no wheezing, no  rhonchi Abdominal: Soft, NT, ND, bowel sounds + Extremities: no edema, no cyanosis    The results of significant diagnostics from this hospitalization (including imaging, microbiology, ancillary and laboratory) are listed below for reference.     Microbiology: Recent Results (from the past 240 hour(s))  SARS Coronavirus 2 (CEPHEID - Performed in [place] hospital lab), Hosp Order     Status: None   Collection Time: 11/29/18 10:21 PM   Specimen: Nasopharyngeal Swab  Result Value Ref Range Status   SARS Coronavirus 2 NEGATIVE NEGATIVE Final    Comment: (NOTE) If result is NEGATIVE SARS-CoV-2 target nucleic acids are NOT DETECTED. The SARS-CoV-2 RNA is generally detectable in upper and lower  respiratory specimens during the acute phase of infection. The lowest  concentration of SARS-CoV-2 viral copies this assay can detect is 250  copies / mL. A negative result does not preclude SARS-CoV-2 infection  and should not be used as the sole basis for treatment or other  patient management decisions.  A negative result may occur with  improper specimen collection / handling, submission of specimen other  than nasopharyngeal swab, presence of viral mutation(s) within the  areas targeted by this assay, and inadequate number of viral copies  (<250 copies / mL). A negative result must be combined with clinical  observations, patient history, and epidemiological information. If result is POSITIVE SARS-CoV-2 target nucleic acids are DETECTED. The SARS-CoV-2 RNA is generally detectable in upper and lower  respiratory specimens dur ing the acute phase of infection.  Positive  results are indicative of active infection with SARS-CoV-2.  Clinical  correlation with patient history and other diagnostic information is  necessary to determine patient infection status.  Positive results do  not rule out bacterial infection or co-infection with other viruses. If result is PRESUMPTIVE  POSTIVE SARS-CoV-2 nucleic acids MAY BE PRESENT.   A presumptive positive result was obtained on the submitted specimen  and confirmed on repeat testing.  While 2019 novel coronavirus  (SARS-CoV-2) nucleic acids may be present in the submitted sample  additional confirmatory testing may be necessary for epidemiological  and / or clinical management purposes  to differentiate between  SARS-CoV-2 and other Sarbecovirus currently known to infect humans.  If clinically indicated additional testing with an alternate test  methodology [phone]) is advised. The SARS-CoV-2 RNA is generally  detectable in upper and lower respiratory sp ecimens during the acute  phase of infection. The expected result is Negative. Fact Sheet for Patients:  StrictlyIdeas.no Fact Sheet for Healthcare Providers: BankingDealers.co.za This test is not yet approved or cleared by the [place] FDA and has been authorized for detection and/or diagnosis of SARS-CoV-2 by FDA under an Emergency Use Authorization (EUA).  This EUA will remain in effect (meaning this test can be used) for the duration of the COVID-19 declaration under Section 564(b)(1) of the Act, 21 U.S.C. section 360bbb-3(b)(1), unless the authorization is terminated or revoked sooner. Performed at [facility], [place] [address] [place], [place] [address]   Culture, blood (routine x 2)     Status: None (Preliminary result)   Collection Time: 11/30/18  2:43 PM   Specimen: BLOOD  Result Value Ref Range Status   Specimen Description BLOOD LEFT ANTECUBITAL  Final   Special Requests   Final    BOTTLES DRAWN AEROBIC AND ANAEROBIC Blood Culture adequate volume   Culture   Final    NO GROWTH 3 DAYS Performed at [facility], [place] [address] [place], [place] [address]    Report Status PENDING  Incomplete  Culture, blood (routine x 2)     Status: None (Preliminary result)   Collection Time:  11/30/18  2:52 PM   Specimen: BLOOD LEFT HAND  Result Value Ref Range Status   Specimen Description BLOOD LEFT HAND  Final   Special Requests   Final    BOTTLES DRAWN AEROBIC AND ANAEROBIC Blood Culture results may not be optimal due to an inadequate volume of blood received in culture bottles   Culture   Final    NO GROWTH 3 DAYS Performed at [facility], [place] [address] [place], [place] [address]    Report Status PENDING  Incomplete     Labs: BNP (last 3 results) No results for input(s): BNP in the last 8760 hours. Basic Metabolic Panel: Recent Labs  Lab 11/29/18 1749 12/02/18 0552 12/03/18 0523  NA 136 141 141  K 4.3 4.3 3.7  CL 103 96* 97*  CO2 24 35* 33*  GLUCOSE 140* 129* 154*  BUN 17 13 13   CREATININE 1.04* 0.91 0.87  CALCIUM 10.6* 10.9* 10.9*   Liver Function Tests:  Recent Labs  Lab 11/29/18 1749  AST 31  ALT 37  ALKPHOS 145*  BILITOT 0.6  PROT 6.6  ALBUMIN 3.4*   No results for input(s): LIPASE, AMYLASE in the last 168 hours. No results for input(s): AMMONIA in the last 168 hours. CBC: Recent Labs  Lab 11/29/18 1749 11/30/18 0657 12/02/18 0552 12/03/18 0523  WBC 15.0* 14.2* 15.0* 15.7*  NEUTROABS 12.1*  --  11.2* 12.7*  HGB 13.0 13.0 12.9 13.9  HCT 44.6 44.7 43.9 46.8*  MCV 107.5* 106.4* 104.0* 104.2*  PLT 301 253 330 337   Cardiac Enzymes: No results for input(s): CKTOTAL, CKMB, CKMBINDEX, TROPONINI in the last 168 hours. BNP: Invalid input(s): POCBNP CBG: Recent Labs  Lab 11/30/18 0343  GLUCAP 101*   D-Dimer No results for input(s): DDIMER in the last 72 hours. Hgb A1c No results for input(s): HGBA1C in the last 72 hours. Lipid Profile No results for input(s): CHOL, HDL, LDLCALC, TRIG, CHOLHDL, LDLDIRECT in the last 72 hours. Thyroid function studies No results for input(s): TSH, T4TOTAL, T3FREE, THYROIDAB in the last 72 hours.  Invalid input(s): FREET3 Anemia work up No results for input(s): VITAMINB12, FOLATE,  FERRITIN, TIBC, IRON, RETICCTPCT in the last 72 hours. Urinalysis    Component Value Date/Time   COLORURINE STRAW (A) 11/29/2018 1747   APPEARANCEUR CLEAR 11/29/2018 1747   LABSPEC 1.011 11/29/2018 1747   PHURINE 6.0 11/29/2018 1747   GLUCOSEU NEGATIVE 11/29/2018 1747   HGBUR NEGATIVE 11/29/2018 1747   BILIRUBINUR NEGATIVE 11/29/2018 1747   KETONESUR NEGATIVE 11/29/2018 1747   PROTEINUR NEGATIVE 11/29/2018 1747   NITRITE NEGATIVE 11/29/2018 1747   LEUKOCYTESUR NEGATIVE 11/29/2018 1747   Sepsis Labs Invalid input(s): PROCALCITONIN,  WBC,  LACTICIDVEN Microbiology Recent Results (from the past 240 hour(s))  SARS Coronavirus 2 (CEPHEID - Performed in [place] hospital lab), Hosp Order     Status: None   Collection Time: 11/29/18 10:21 PM   Specimen: Nasopharyngeal Swab  Result Value Ref Range Status   SARS Coronavirus 2 NEGATIVE NEGATIVE Final    Comment: (NOTE) If result is NEGATIVE SARS-CoV-2 target nucleic acids are NOT DETECTED. The SARS-CoV-2 RNA is generally detectable in upper and lower  respiratory specimens during the acute phase of infection. The lowest  concentration of SARS-CoV-2 viral copies this assay can detect is 250  copies / mL. A negative result does not preclude SARS-CoV-2 infection  and should not be used as the sole basis for treatment or other  patient management decisions.  A negative result may occur with  improper specimen collection / handling, submission of specimen other  than nasopharyngeal swab, presence of viral mutation(s) within the  areas targeted by this assay, and inadequate number of viral copies  (<250 copies / mL). A negative result must be combined with clinical  observations, patient history, and epidemiological information. If result is POSITIVE SARS-CoV-2 target nucleic acids are DETECTED. The SARS-CoV-2 RNA is generally detectable in upper and lower  respiratory specimens dur ing the acute phase of infection.  Positive  results  are indicative of active infection with SARS-CoV-2.  Clinical  correlation with patient history and other diagnostic information is  necessary to determine patient infection status.  Positive results do  not rule out bacterial infection or co-infection with other viruses. If result is PRESUMPTIVE POSTIVE SARS-CoV-2 nucleic acids MAY BE PRESENT.   A presumptive positive result was obtained on the submitted specimen  and confirmed on repeat testing.  While 2019 novel coronavirus  (SARS-CoV-2) nucleic  acids may be present in the submitted sample  additional confirmatory testing may be necessary for epidemiological  and / or clinical management purposes  to differentiate between  SARS-CoV-2 and other [place]ovirus currently known to infect humans.  If clinically indicated additional testing with an alternate test  methodology [phone]) is advised. The SARS-CoV-2 RNA is generally  detectable in upper and lower respiratory sp ecimens during the acute  phase of infection. The expected result is Negative. Fact Sheet for Patients:  StrictlyIdeas.no Fact Sheet for Healthcare Providers: BankingDealers.co.za This test is not yet approved or cleared by the [place] FDA and has been authorized for detection and/or diagnosis of SARS-CoV-2 by FDA under an Emergency Use Authorization (EUA).  This EUA will remain in effect (meaning this test can be used) for the duration of the COVID-19 declaration under Section 564(b)(1) of the Act, 21 U.S.C. section 360bbb-3(b)(1), unless the authorization is terminated or revoked sooner. Performed at [facility], [place] [address] [place], [place] [address]   Culture, blood (routine x 2)     Status: None (Preliminary result)   Collection Time: 11/30/18  2:43 PM   Specimen: BLOOD  Result Value Ref Range Status   Specimen Description BLOOD LEFT ANTECUBITAL  Final   Special Requests   Final    BOTTLES DRAWN  AEROBIC AND ANAEROBIC Blood Culture adequate volume   Culture   Final    NO GROWTH 3 DAYS Performed at [facility], [place] [address], [place], [place] [address]    Report Status PENDING  Incomplete  Culture, blood (routine x 2)     Status: None (Preliminary result)   Collection Time: 11/30/18  2:52 PM   Specimen: BLOOD LEFT HAND  Result Value Ref Range Status   Specimen Description BLOOD LEFT HAND  Final   Special Requests   Final    BOTTLES DRAWN AEROBIC AND ANAEROBIC Blood Culture results may not be optimal due to an inadequate volume of blood received in culture bottles   Culture   Final    NO GROWTH 3 DAYS Performed at [facility], [place] [address] [place], [place] [address]    Report Status PENDING  Incomplete    Please note: You were cared for by a hospitalist during your hospital stay. Once you are discharged, your primary care physician will handle any further medical issues. Please note that NO REFILLS for any discharge medications will be authorized once you are discharged, as it is imperative that you return to your primary care physician (or establish a relationship with a primary care physician if you do not have one) for your post hospital discharge needs so that they can reassess your need for medications and monitor your lab values.    Time coordinating discharge: 40 minutes  SIGNED:   Shelly Coss, MD  [facility] 12/03/2018, 10:32 AM Pager [phone]  If 7PM-7AM, please contact night-coverage [email or web address] Password [email or web address]

## 2018-12-03 NOTE — Progress Notes (Signed)
Provided discharge education/instructions, all questions and concerns addressed, Pt not in distress, discharged home with belongings accompanied by husband. 

## 2018-12-03 NOTE — Plan of Care (Signed)
  Problem: Health Behavior/Discharge Planning: Goal: Ability to manage health-related needs will improve Outcome: Progressing   Problem: Clinical Measurements: Goal: Ability to maintain clinical measurements within normal limits will improve Outcome: Progressing   Problem: Activity: Goal: Risk for activity intolerance will decrease Outcome: Progressing   Problem: Coping: Goal: Level of anxiety will decrease Outcome: Progressing   Problem: Elimination: Goal: Will not experience complications related to bowel motility Outcome: Progressing   Problem: Pain Managment: Goal: General experience of comfort will improve Outcome: Progressing   Problem: Safety: Goal: Ability to remain free from injury will improve Outcome: Progressing   

## 2018-12-05 LAB — CULTURE, BLOOD (ROUTINE X 2)
Culture: NO GROWTH
Culture: NO GROWTH
Special Requests: ADEQUATE

## 2018-12-06 ENCOUNTER — Encounter (HOSPITAL_COMMUNITY): Payer: Self-pay

## 2018-12-06 ENCOUNTER — Inpatient Hospital Stay (HOSPITAL_COMMUNITY)
Admission: EM | Admit: 2018-12-06 | Discharge: 2018-12-11 | DRG: 291 | Disposition: A | Discharge status: Home Health | Payer: Medicare Other | Attending: Internal Medicine | Admitting: Internal Medicine

## 2018-12-06 ENCOUNTER — Other Ambulatory Visit: Payer: Self-pay

## 2018-12-06 ENCOUNTER — Emergency Department (HOSPITAL_COMMUNITY): Payer: Medicare Other

## 2018-12-06 DIAGNOSIS — D72829 Elevated white blood cell count, unspecified: Secondary | ICD-10-CM | POA: Diagnosis present

## 2018-12-06 DIAGNOSIS — R5383 Other fatigue: Secondary | ICD-10-CM | POA: Diagnosis not present

## 2018-12-06 DIAGNOSIS — R0689 Other abnormalities of breathing: Secondary | ICD-10-CM | POA: Diagnosis not present

## 2018-12-06 DIAGNOSIS — Z885 Allergy status to narcotic agent status: Secondary | ICD-10-CM

## 2018-12-06 DIAGNOSIS — Z66 Do not resuscitate: Secondary | ICD-10-CM | POA: Diagnosis present

## 2018-12-06 DIAGNOSIS — L819 Disorder of pigmentation, unspecified: Secondary | ICD-10-CM | POA: Diagnosis present

## 2018-12-06 DIAGNOSIS — E039 Hypothyroidism, unspecified: Secondary | ICD-10-CM | POA: Diagnosis present

## 2018-12-06 DIAGNOSIS — Z7951 Long term (current) use of inhaled steroids: Secondary | ICD-10-CM

## 2018-12-06 DIAGNOSIS — R5381 Other malaise: Secondary | ICD-10-CM | POA: Diagnosis present

## 2018-12-06 DIAGNOSIS — J9611 Chronic respiratory failure with hypoxia: Secondary | ICD-10-CM | POA: Insufficient documentation

## 2018-12-06 DIAGNOSIS — F319 Bipolar disorder, unspecified: Secondary | ICD-10-CM | POA: Diagnosis present

## 2018-12-06 DIAGNOSIS — I110 Hypertensive heart disease with heart failure: Secondary | ICD-10-CM | POA: Diagnosis not present

## 2018-12-06 DIAGNOSIS — J9692 Respiratory failure, unspecified with hypercapnia: Secondary | ICD-10-CM | POA: Diagnosis present

## 2018-12-06 DIAGNOSIS — R0902 Hypoxemia: Secondary | ICD-10-CM | POA: Diagnosis not present

## 2018-12-06 DIAGNOSIS — M329 Systemic lupus erythematosus, unspecified: Secondary | ICD-10-CM | POA: Diagnosis present

## 2018-12-06 DIAGNOSIS — G629 Polyneuropathy, unspecified: Secondary | ICD-10-CM | POA: Diagnosis present

## 2018-12-06 DIAGNOSIS — G4733 Obstructive sleep apnea (adult) (pediatric): Secondary | ICD-10-CM

## 2018-12-06 DIAGNOSIS — Z20828 Contact with and (suspected) exposure to other viral communicable diseases: Secondary | ICD-10-CM | POA: Diagnosis present

## 2018-12-06 DIAGNOSIS — I509 Heart failure, unspecified: Secondary | ICD-10-CM

## 2018-12-06 DIAGNOSIS — Z888 Allergy status to other drugs, medicaments and biological substances status: Secondary | ICD-10-CM

## 2018-12-06 DIAGNOSIS — J90 Pleural effusion, not elsewhere classified: Secondary | ICD-10-CM | POA: Diagnosis not present

## 2018-12-06 DIAGNOSIS — E872 Acidosis: Secondary | ICD-10-CM | POA: Diagnosis present

## 2018-12-06 DIAGNOSIS — I5032 Chronic diastolic (congestive) heart failure: Secondary | ICD-10-CM | POA: Insufficient documentation

## 2018-12-06 DIAGNOSIS — E876 Hypokalemia: Secondary | ICD-10-CM | POA: Diagnosis present

## 2018-12-06 DIAGNOSIS — E662 Morbid (severe) obesity with alveolar hypoventilation: Secondary | ICD-10-CM | POA: Diagnosis present

## 2018-12-06 DIAGNOSIS — J9621 Acute and chronic respiratory failure with hypoxia: Secondary | ICD-10-CM | POA: Diagnosis present

## 2018-12-06 DIAGNOSIS — J9622 Acute and chronic respiratory failure with hypercapnia: Secondary | ICD-10-CM | POA: Diagnosis not present

## 2018-12-06 DIAGNOSIS — Z818 Family history of other mental and behavioral disorders: Secondary | ICD-10-CM

## 2018-12-06 DIAGNOSIS — E785 Hyperlipidemia, unspecified: Secondary | ICD-10-CM | POA: Diagnosis present

## 2018-12-06 DIAGNOSIS — I878 Other specified disorders of veins: Secondary | ICD-10-CM | POA: Diagnosis present

## 2018-12-06 DIAGNOSIS — Z9981 Dependence on supplemental oxygen: Secondary | ICD-10-CM

## 2018-12-06 DIAGNOSIS — G9341 Metabolic encephalopathy: Secondary | ICD-10-CM | POA: Diagnosis not present

## 2018-12-06 DIAGNOSIS — R531 Weakness: Secondary | ICD-10-CM | POA: Diagnosis not present

## 2018-12-06 DIAGNOSIS — M47896 Other spondylosis, lumbar region: Secondary | ICD-10-CM | POA: Diagnosis present

## 2018-12-06 DIAGNOSIS — K589 Irritable bowel syndrome without diarrhea: Secondary | ICD-10-CM | POA: Diagnosis present

## 2018-12-06 DIAGNOSIS — I5033 Acute on chronic diastolic (congestive) heart failure: Secondary | ICD-10-CM | POA: Diagnosis present

## 2018-12-06 DIAGNOSIS — I10 Essential (primary) hypertension: Secondary | ICD-10-CM | POA: Diagnosis not present

## 2018-12-06 DIAGNOSIS — Z79890 Hormone replacement therapy (postmenopausal): Secondary | ICD-10-CM

## 2018-12-06 DIAGNOSIS — M19042 Primary osteoarthritis, left hand: Secondary | ICD-10-CM | POA: Diagnosis present

## 2018-12-06 DIAGNOSIS — Z79899 Other long term (current) drug therapy: Secondary | ICD-10-CM

## 2018-12-06 DIAGNOSIS — K219 Gastro-esophageal reflux disease without esophagitis: Secondary | ICD-10-CM | POA: Diagnosis present

## 2018-12-06 DIAGNOSIS — Z8349 Family history of other endocrine, nutritional and metabolic diseases: Secondary | ICD-10-CM

## 2018-12-06 DIAGNOSIS — M19041 Primary osteoarthritis, right hand: Secondary | ICD-10-CM | POA: Diagnosis present

## 2018-12-06 DIAGNOSIS — Z880 Allergy status to penicillin: Secondary | ICD-10-CM

## 2018-12-06 DIAGNOSIS — Z791 Long term (current) use of non-steroidal anti-inflammatories (NSAID): Secondary | ICD-10-CM

## 2018-12-06 DIAGNOSIS — Z6841 Body Mass Index (BMI) 40.0 and over, adult: Secondary | ICD-10-CM

## 2018-12-06 DIAGNOSIS — Z8249 Family history of ischemic heart disease and other diseases of the circulatory system: Secondary | ICD-10-CM

## 2018-12-06 DIAGNOSIS — T502X5A Adverse effect of carbonic-anhydrase inhibitors, benzothiadiazides and other diuretics, initial encounter: Secondary | ICD-10-CM | POA: Diagnosis present

## 2018-12-06 DIAGNOSIS — J302 Other seasonal allergic rhinitis: Secondary | ICD-10-CM | POA: Diagnosis present

## 2018-12-06 DIAGNOSIS — R0602 Shortness of breath: Secondary | ICD-10-CM | POA: Diagnosis not present

## 2018-12-06 DIAGNOSIS — F419 Anxiety disorder, unspecified: Secondary | ICD-10-CM | POA: Diagnosis present

## 2018-12-06 DIAGNOSIS — T43595A Adverse effect of other antipsychotics and neuroleptics, initial encounter: Secondary | ICD-10-CM | POA: Diagnosis present

## 2018-12-06 DIAGNOSIS — R000 Tachycardia, unspecified: Secondary | ICD-10-CM | POA: Diagnosis not present

## 2018-12-06 DIAGNOSIS — M170 Bilateral primary osteoarthritis of knee: Secondary | ICD-10-CM | POA: Diagnosis present

## 2018-12-06 DIAGNOSIS — Z886 Allergy status to analgesic agent status: Secondary | ICD-10-CM

## 2018-12-06 LAB — CBC WITH DIFFERENTIAL/PLATELET
Abs Immature Granulocytes: 0.33 10*3/uL — ABNORMAL HIGH (ref 0.00–0.07)
Basophils Absolute: 0.1 10*3/uL (ref 0.0–0.1)
Basophils Relative: 1 %
Eosinophils Absolute: 0.1 10*3/uL (ref 0.0–0.5)
Eosinophils Relative: 0 %
HCT: 46.9 % — ABNORMAL HIGH (ref 36.0–46.0)
Hemoglobin: 13.3 g/dL (ref 12.0–15.0)
Immature Granulocytes: 2 %
Lymphocytes Relative: 8 %
Lymphs Abs: 1.2 10*3/uL (ref 0.7–4.0)
MCH: 30.7 pg (ref 26.0–34.0)
MCHC: 28.4 g/dL — ABNORMAL LOW (ref 30.0–36.0)
MCV: 108.3 fL — ABNORMAL HIGH (ref 80.0–100.0)
Monocytes Absolute: 2.1 10*3/uL — ABNORMAL HIGH (ref 0.1–1.0)
Monocytes Relative: 13 %
Neutro Abs: 12.7 10*3/uL — ABNORMAL HIGH (ref 1.7–7.7)
Neutrophils Relative %: 76 %
Platelets: 348 10*3/uL (ref 150–400)
RBC: 4.33 MIL/uL (ref 3.87–5.11)
RDW: 13.6 % (ref 11.5–15.5)
WBC: 16.5 10*3/uL — ABNORMAL HIGH (ref 4.0–10.5)
nRBC: 0 % (ref 0.0–0.2)

## 2018-12-06 LAB — BASIC METABOLIC PANEL
Anion gap: 9 (ref 5–15)
BUN: 36 mg/dL — ABNORMAL HIGH (ref 8–23)
CO2: 36 mmol/L — ABNORMAL HIGH (ref 22–32)
Calcium: 11.1 mg/dL — ABNORMAL HIGH (ref 8.9–10.3)
Chloride: 93 mmol/L — ABNORMAL LOW (ref 98–111)
Creatinine, Ser: 1.09 mg/dL — ABNORMAL HIGH (ref 0.44–1.00)
GFR calc Af Amer: 60 mL/min — ABNORMAL LOW (ref >60)
GFR calc non Af Amer: 52 mL/min — ABNORMAL LOW (ref >60)
Glucose, Bld: 118 mg/dL — ABNORMAL HIGH (ref 70–99)
Potassium: 4.3 mmol/L (ref 3.5–5.1)
Sodium: 138 mmol/L (ref 135–145)

## 2018-12-06 LAB — BLOOD GAS, ARTERIAL
Acid-Base Excess: 8.3 mmol/L — ABNORMAL HIGH (ref 0.0–2.0)
Bicarbonate: 37.9 mmol/L — ABNORMAL HIGH (ref 20.0–28.0)
Drawn by: 331471
O2 Content: 4 L/min
O2 Saturation: 96.5 %
Patient temperature: 37
pCO2 arterial: 82.8 mmHg (ref 32.0–48.0)
pH, Arterial: 7.283 — ABNORMAL LOW (ref 7.350–7.450)
pO2, Arterial: 89.6 mmHg (ref 83.0–108.0)

## 2018-12-06 LAB — LITHIUM LEVEL: Lithium Lvl: 1.14 mmol/L (ref 0.60–1.20)

## 2018-12-06 LAB — URINALYSIS, ROUTINE W REFLEX MICROSCOPIC
Bilirubin Urine: NEGATIVE
Glucose, UA: NEGATIVE mg/dL
Hgb urine dipstick: NEGATIVE
Ketones, ur: NEGATIVE mg/dL
Leukocytes,Ua: NEGATIVE
Nitrite: NEGATIVE
Protein, ur: NEGATIVE mg/dL
Specific Gravity, Urine: 1.006 (ref 1.005–1.030)
pH: 6 (ref 5.0–8.0)

## 2018-12-06 LAB — TROPONIN I (HIGH SENSITIVITY)
Troponin I (High Sensitivity): 24 ng/L — ABNORMAL HIGH (ref <18)
Troponin I (High Sensitivity): 28 ng/L — ABNORMAL HIGH (ref <18)

## 2018-12-06 LAB — SARS CORONAVIRUS 2 BY RT PCR (HOSPITAL ORDER, PERFORMED IN ~~LOC~~ HOSPITAL LAB): SARS Coronavirus 2: NEGATIVE

## 2018-12-06 LAB — BRAIN NATRIURETIC PEPTIDE: B Natriuretic Peptide: 82.5 pg/mL (ref 0.0–100.0)

## 2018-12-06 MED ORDER — POLYVINYL ALCOHOL 1.4 % OP SOLN
2.0000 [drp] | Freq: Every day | OPHTHALMIC | Status: DC
  Administered 2018-12-06 – 2018-12-10 (×5): 2 [drp] via OPHTHALMIC
  Filled 2018-12-06: qty 15

## 2018-12-06 MED ORDER — ORAL CARE MOUTH RINSE
15.0000 mL | Freq: Two times a day (BID) | OROMUCOSAL | Status: DC
  Administered 2018-12-07 – 2018-12-10 (×5): 15 mL via OROMUCOSAL

## 2018-12-06 MED ORDER — ENOXAPARIN SODIUM 40 MG/0.4ML ~~LOC~~ SOLN
40.0000 mg | SUBCUTANEOUS | Status: DC
  Administered 2018-12-06 – 2018-12-10 (×5): 40 mg via SUBCUTANEOUS
  Filled 2018-12-06 (×5): qty 0.4

## 2018-12-06 MED ORDER — NYSTATIN 100000 UNIT/GM EX POWD
Freq: Two times a day (BID) | CUTANEOUS | Status: DC
  Administered 2018-12-06 – 2018-12-11 (×10): via TOPICAL
  Filled 2018-12-06: qty 15

## 2018-12-06 MED ORDER — HYDRALAZINE HCL 20 MG/ML IJ SOLN
10.0000 mg | Freq: Four times a day (QID) | INTRAMUSCULAR | Status: DC | PRN
  Administered 2018-12-07: 10 mg via INTRAVENOUS
  Filled 2018-12-06: qty 1

## 2018-12-06 MED ORDER — CHLORHEXIDINE GLUCONATE 0.12 % MT SOLN
15.0000 mL | Freq: Two times a day (BID) | OROMUCOSAL | Status: DC
  Administered 2018-12-06 – 2018-12-10 (×9): 15 mL via OROMUCOSAL
  Filled 2018-12-06 (×6): qty 15

## 2018-12-06 MED ORDER — FUROSEMIDE 10 MG/ML IJ SOLN
80.0000 mg | Freq: Once | INTRAMUSCULAR | Status: AC
  Administered 2018-12-06: 17:00:00 80 mg via INTRAVENOUS
  Filled 2018-12-06: qty 8

## 2018-12-06 MED ORDER — CYCLOSPORINE 0.05 % OP EMUL
1.0000 [drp] | Freq: Two times a day (BID) | OPHTHALMIC | Status: DC
  Administered 2018-12-06 – 2018-12-11 (×10): 1 [drp] via OPHTHALMIC
  Filled 2018-12-06 (×11): qty 30

## 2018-12-06 MED ORDER — FLUTICASONE PROPIONATE 50 MCG/ACT NA SUSP
1.0000 | Freq: Every day | NASAL | Status: DC
  Administered 2018-12-06 – 2018-12-11 (×6): 1 via NASAL
  Filled 2018-12-06: qty 16

## 2018-12-06 MED ORDER — FUROSEMIDE 10 MG/ML IJ SOLN: 40.0000 mg | Freq: Two times a day (BID) | INTRAMUSCULAR | Status: DC

## 2018-12-06 NOTE — ED Notes (Signed)
Rpt called to Haley RN 

## 2018-12-06 NOTE — H&P (Addendum)
History and Physical  Jessica [name] GNF:[id number] DOB: December 06, 1950 DOA: 12/06/2018  Referring physician: EDP PCP: Alroy Dust, L.Marlou Sa, MD   Chief Complaint: lethargy   HPI: Jessica [name] is a 68 y.o. female   bipolar disorder, depression,  hypertension, hyperlipidemia, hypothyroidism, IBS, spinal stenosis, lupus, obesity OSA on cpap,  chronic hypoxemic respiratory failure on home oxygen (2 L at rest and 4 L with activity/ sleep) who is recently discharged from hospital after treating acute on chronic diastolic chf and lower extremity cellulitis, returned to the ED due to lethargy, hypoxia, bilateral lower extremity edema. No reported fever. No reports of chest pain.  ED course: she is very lethargic, no fever, no tachycardia, bp elevated, no hypoxia at rest on 2liter o2 supplement, ABG showed significant co2 retention ( pco2 82.8) with acidosis (PH 7.283) ,she is edematous, cxr "Findings consistent with congestive heart failure". Wbc 16.5,  Cr 1.09. bnp 82, high sensitivity troponin 24, she received lasix 80mg  x1 in the ED and is put on bipap. Per husband patient is DNR/DNI, hospitalist called to admit the patient.   SARS cov2 screening negative in the ED  Review of Systems:  Detail per HPI, Review of systems are otherwise negative  Past Medical History:  Diagnosis Date  . Arthritis    lower back, hands, knees  . Bipolar disorder ([place])   . Cellulitis and abscess of left leg 11/29/2018  . Depression   . Dyspnea    with exertion  . GERD (gastroesophageal reflux disease)    patient unsure about this dx - no meds  . Headache   . History of IBS    watches diet  . Hyperlipidemia   . Hypertension   . Hypothyroidism   . Obese   . Plantar fasciitis   . Seasonal allergies   . Sleep apnea 2017   uses CPAP   . Spinal stenosis of lumbar region   . SVD (spontaneous vaginal delivery)    x 2  . Systemic lupus ([place])   . Thyroid disease    Past Surgical History:  Procedure  Laterality Date  . CARPAL TUNNEL RELEASE Left 09/16/2017   Procedure: LEFT CARPAL TUNNEL RELEASE;  Surgeon: Daryll Brod, MD;  Location: [facility];  Service: Orthopedics;  Laterality: Left;  . carpel tunnel surgery Right   . CATARACT EXTRACTION Bilateral 2007   w/ lens implants  . COLONOSCOPY    . DILATION AND CURETTAGE OF UTERUS    . EYE SURGERY    . FOOT SURGERY Right    hammer toe  . FRACTURE SURGERY     R tibia and fibula  . LEG SURGERY Right   . TONSILLECTOMY    . WISDOM TOOTH EXTRACTION     Social History:  reports that she has never smoked. She has never used smokeless tobacco. She reports current alcohol use. She reports that she does not use drugs.  Patient used to teach accounting in [employer] but started on disability due to bipolar disorder Patient lives at home with husband & is able to participate in activities of daily living with assistance  Allergies  Allergen Reactions  . Ibuprofen Diarrhea, Nausea Only and Other (See Comments)    Extreme stomach pain  Makes her feel bad  . Codeine     UNSPECIFIED REACTION   . Nabumetone     UNSPECIFIED REACTION   . Promethazine     UNSPECIFIED REACTION   . Amoxicillin-Pot Clavulanate Nausea And Vomiting  . Erythromycin Base Diarrhea  .  Promethazine-Codeine Other (See Comments)    "Makes her feel bad"    Family History  Problem Relation Age of Onset  . Thyroid disease Mother   . Breast cancer Mother   . Heart disease Mother   . Bipolar disorder Father   . Diabetes Father   . Heart disease Father   . Dementia Father       Prior to Admission medications   Medication Sig Start Date End Date Taking? Authorizing Provider  acetaminophen (TYLENOL ARTHRITIS PAIN) 650 MG CR tablet Take 1,300 mg by mouth 3 (three) times daily.     [provider]  Acetylcysteine (NAC) 600 MG CAPS Take 600 mg by mouth 2 (two) times daily.    [provider]  albuterol (VENTOLIN HFA) 108 (90 Base) MCG/ACT inhaler Inhale 2 puffs  into the lungs every 6 (six) hours as needed for wheezing or shortness of breath. 11/22/18   Margaretha Seeds, MD  amLODipine (NORVASC) 5 MG tablet Take 5 mg at bedtime by mouth. 01/19/17   [provider]  Carboxymethylcellul-Glycerin (REFRESH OPTIVE OP) Place 1-2 drops into both eyes daily as needed (dry eyes).     [provider]  celecoxib (CELEBREX) 200 MG capsule TAKE 1 CAPSULE BY MOUTH EVERY DAY Patient taking differently: Take 200 mg by mouth daily.  11/14/18   Bo Merino, MD  clotrimazole-betamethasone (LOTRISONE) cream Apply 1 application topically 2 (two) times daily as needed (rash).    [provider]  cycloSPORINE (RESTASIS) 0.05 % ophthalmic emulsion Place 1 drop into both eyes 2 (two) times daily.    [provider]  doxycycline (ADOXA) 100 MG tablet Take 1 tablet (100 mg total) by mouth 2 (two) times daily for 4 days. 12/03/18 12/07/18  Shelly Coss, MD  DULoxetine (CYMBALTA) 60 MG capsule TAKE 1 CAPSULE BY MOUTH EVERY DAY IN THE MORNING Patient taking differently: Take 60 mg by mouth daily.  11/01/18   Cottle, Billey Co., MD  econazole nitrate 1 % cream Apply 1 application topically 3 (three) times daily as needed (fungus).  10/07/18   [provider]  fluticasone (FLONASE) 50 MCG/ACT nasal spray Place 1 spray into both nostrils daily.    [provider]  furosemide (LASIX) 40 MG tablet Take two pills twice a day for a week then continue taking one pill a day 12/03/18   Shelly Coss, MD  gabapentin (NEURONTIN) 300 MG capsule Take 600 mg by mouth at bedtime.     [provider]  HYDROcodone-acetaminophen (NORCO) 5-325 MG tablet Take 1 tablet by mouth every 6 (six) hours as needed for moderate pain. 09/16/17   Daryll Brod, MD  ketoconazole (NIZORAL) 2 % cream Apply 1 application topically 3 (three) times daily as needed for irritation.    [provider]  levothyroxine (SYNTHROID) 25 MCG tablet Take 225 mcg by  mouth daily before breakfast.     [provider]  levothyroxine (SYNTHROID, LEVOTHROID) 200 MCG tablet Take 225 mcg by mouth daily before breakfast.     [provider]  lisdexamfetamine (VYVANSE) 60 MG capsule Take 1 capsule (60 mg total) by mouth every morning. 11/29/18   Cottle, Billey Co., MD  lithium carbonate (LITHOBID) 300 MG CR tablet 300mg  in the AM and 600mg  (take an additional 150mg  capsule)at bedtime. 10/16/18   Cottle, Billey Co., MD  lithium carbonate 150 MG capsule TAKE 1 CAPSULE BY MOUTH AT BEDTIME. TAKE WITH 600MG  LITHIUM CARBONATE AT BEDTIME TO EQUAL 750MG   Patient taking differently: Take 750 mg by mouth at bedtime. Take along with 300 mg tablet 10/23/18   Cottle, Billey Co., MD  loratadine (CLARITIN) 10 MG tablet Take 10 mg by mouth daily as needed for allergies.     [provider]  LORazepam (ATIVAN) 0.5 MG tablet Take 0.5 mg by mouth every 6 (six) hours as needed for anxiety.  09/12/17   [provider]  methocarbamol (ROBAXIN) 500 MG tablet TAKE 1/2 TO 1 TABLET BY MOUTH UP TO 3 TIMES DAILY AS NEEDED FOR MUSCLE SPASMS Patient taking differently: Take 250-500 mg by mouth every 8 (eight) hours as needed for muscle spasms.  09/04/18   Bo Merino, MD  mirabegron ER (MYRBETRIQ) 50 MG TB24 tablet Take 50 mg at bedtime by mouth.  01/06/16   [provider]  montelukast (SINGULAIR) 10 MG tablet Take 1 tablet by mouth daily. 11/10/18   [provider]  Multiple Vitamins-Minerals (MULTIVITAMIN WITH MINERALS) tablet Take 1 tablet by mouth daily.    [provider]  Multiple Vitamins-Minerals (PRESERVISION AREDS 2) CAPS Take 1 capsule 2 (two) times daily by mouth.    [provider]  nystatin (MYCOSTATIN/NYSTOP) powder Apply topically 2 (two) times daily for 7 days. 12/03/18 12/10/18  Shelly Coss, MD  potassium chloride (KLOR-CON) 20 MEQ packet Take 20 mEq by mouth daily. 12/03/18 01/02/19  Shelly Coss, MD   pravastatin (PRAVACHOL) 20 MG tablet Take 20 mg at bedtime by mouth.     [provider]  Sodium Chloride, Hypertonic, (MURO 128 OP) Place 1 drop into both eyes 3 (three) times daily.     [provider]  Thiamine HCl (VITAMIN B1) 100 MG TABS Take 1 tablet by mouth daily. 10/20/18   [provider]  valsartan (DIOVAN) 80 MG tablet Take 80 mg by mouth daily.    [provider]  vitamin B-12 (CYANOCOBALAMIN) 100 MCG tablet Take 100 mcg by mouth 2 (two) times daily. 10/20/18   [provider]  Vitamin D, Ergocalciferol, (DRISDOL) 1.25 MG (50000 UT) CAPS capsule Take 50,000 Units by mouth every 7 (seven) days.    [provider]    Physical Exam: BP (!) 163/88   Pulse 92   Temp 98.8 F (37.1 C) (Oral)   Resp 16   SpO2 97%   . General:  Obese, On bipap, not open eyes to voice . Eyes: PERRL . ENT: unremarkable . Neck: supple, no JVD . Cardiovascular: RRR . Respiratory: very diminished , no wheezing heard, difficult to auscultate due to body habitus  . Abdomen: soft/NT/ND, positive bowel sounds . Skin: no rash . Musculoskeletal:  Bilateral lower extremity 3+ pitting edema  . Psychiatric: not able to assess . Neurologic: lethargy, not open eyes          Labs on Admission:  Basic Metabolic Panel: Recent Labs  Lab 11/29/18 1749 12/02/18 0552 12/03/18 0523 12/06/18 1514  NA 136 141 141 138  K 4.3 4.3 3.7 4.3  CL 103 96* 97* 93*  CO2 24 35* 33* 36*  GLUCOSE 140* 129* 154* 118*  BUN 17 13 13  36*  CREATININE 1.04* 0.91 0.87 1.09*  CALCIUM 10.6* 10.9* 10.9* 11.1*   Liver Function Tests: Recent Labs  Lab 11/29/18 1749  AST 31  ALT 37  ALKPHOS 145*  BILITOT 0.6  PROT 6.6  ALBUMIN 3.4*   No results for input(s): LIPASE, AMYLASE in the last 168 hours. No results for input(s): AMMONIA in the last 168  hours. CBC: Recent Labs  Lab 11/29/18 1749 11/30/18 0657 12/02/18 0552 12/03/18 0523 12/06/18 1514  WBC 15.0* 14.2*  15.0* 15.7* 16.5*  NEUTROABS 12.1*  --  11.2* 12.7* 12.7*  HGB 13.0 13.0 12.9 13.9 13.3  HCT 44.6 44.7 43.9 46.8* 46.9*  MCV 107.5* 106.4* 104.0* 104.2* 108.3*  PLT 301 253 330 337 348   Cardiac Enzymes: No results for input(s): CKTOTAL, CKMB, CKMBINDEX, TROPONINI in the last 168 hours.  BNP (last 3 results) Recent Labs    12/06/18 1514  BNP 82.5    ProBNP (last 3 results) No results for input(s): PROBNP in the last 8760 hours.  CBG: Recent Labs  Lab 11/30/18 0343  GLUCAP 101*    Radiological Exams on Admission: Dg Chest Port 1 View  Result Date: 12/06/2018 CLINICAL DATA:  Shortness of breath. EXAM: PORTABLE CHEST 1 VIEW COMPARISON:  Chest x-rays dated 11/16/2018 and 04/14/2015 FINDINGS: Persistent cardiomegaly. Increased pulmonary vascular prominence particularly of the main pulmonary arteries. Increased density at the lung bases may represent small effusions or slight pulmonary edema. Haziness at the aortic arch probably represents edema or atelectasis. No acute bone abnormality. IMPRESSION: Findings consistent with congestive heart failure. Electronically Signed   By: Lorriane Shire M.D.   On: 12/06/2018 16:15    EKG: Independently reviewed. Sinus rhythm, no acute st/t changes  Assessment/Plan Present on Admission: **None**  Acute on chronic hypercapnea /hypoxic respiratory failure/acute metabolic encephalopathy  - likely combination of chf exacerbation , obesity hypoventilation, polypharmacy could also contribute, there are several sedatives on her home meds list, currently all on hold due to npo -will continue iv lasix, bipap, npo, admit to stepdown units -if patient does not improve, may consider palliative care consult, husband is aware  HTN:  Oral meds held on iv lasix Add on prn hydralazin   Recently treated for lower extremity cellulitis Currently no fever, she does has leukocytosis Will check ua, blood culture Will monitor effect with iv lasix, hold off  abx for now  Psych : bipoalr/depression All oral meds held due to npo Lithium level therapeutic range  Morbid obesity/OSA on home o2 and cpap Currently on bipap  DVT prophylaxis: lovenox  Consultants: none  Code Status: DNR  Family Communication:  Patient and husband at bedside  Disposition Plan: admit to stepdown  Time spent: 55mins  Florencia Reasons MD, PhD, FACP [facility] Pager [phone] If 7PM-7AM, please contact night-coverage at [email or web address], password [facility]

## 2018-12-06 NOTE — ED Triage Notes (Signed)
EMS reports from home, lethargy, hypoxia x 1 week, bi lateral edema.. Seen at [facility] Wednesday, released Sunday Dx cellulitis. Covid negative 11/29/2018 at  [facility].  BP 190/100 HR 100 RR 24  Sp02 100 Non rebreather @ 10lts (Normally uses 4 ltrs 24/7)  Sp02 100 on arrival with 4ltr nasal cannula  CBG 165

## 2018-12-06 NOTE — ED Notes (Signed)
ED TO INPATIENT HANDOFF REPORT  Name/Age/Gender Jessica [name] 68 y.o. female  Code Status    Code Status Orders  (From admission, onward)         Start     Ordered   12/06/18 1759  Do not attempt resuscitation (DNR)  Continuous    Question Answer Comment  In the event of cardiac or respiratory ARREST Do not call a "code blue"   In the event of cardiac or respiratory ARREST Do not perform Intubation, CPR, defibrillation or ACLS   In the event of cardiac or respiratory ARREST Use medication by any route, position, wound care, and other measures to relive pain and suffering. May use oxygen, suction and manual treatment of airway obstruction as needed for comfort.      12/06/18 1758        Code Status History    Date Active Date Inactive Code Status Order ID Comments User Context   11/30/2018 0012 12/03/2018 1617 Full Code [id number]  Shela Leff, MD ED   01/23/2013 0930 01/23/2013 1926 Full Code [id number]  Theodis Blaze, MD Inpatient   Advance Care Planning Activity    Advance Directive Documentation     Most Recent Value  Type of Advance Directive  Living will  Pre-existing out of facility DNR order (yellow form or pink MOST form)  -  "MOST" Form in Place?  -      Home/SNF/Other Home  Chief Complaint hypotensive  Level of Care/Admitting Diagnosis ED Disposition    ED Disposition Condition [place]: [place] [[id number]]  Level of Care: Stepdown [14]  Admit to SDU based on following criteria: Respiratory Distress:  Frequent assessment and/or intervention to maintain adequate ventilation/respiration, pulmonary toilet, and respiratory treatment.  Covid Evaluation: Asymptomatic Screening Protocol (No Symptoms)  Diagnosis: Hypercapnic respiratory failure [facility]) [[id number]]  Admitting Physician: Florencia Reasons [[id number]]  Attending Physician: Florencia Reasons [[id number]]  Estimated length of stay: past midnight tomorrow  Certification:: I  certify this patient will need inpatient services for at least 2 midnights  PT Class (Do Not Modify): Inpatient [101]  PT Acc Code (Do Not Modify): Private [1]       Medical History Past Medical History:  Diagnosis Date  . Arthritis    lower back, hands, knees  . Bipolar disorder ([place])   . Cellulitis and abscess of left leg 11/29/2018  . Depression   . Dyspnea    with exertion  . GERD (gastroesophageal reflux disease)    patient unsure about this dx - no meds  . Headache   . History of IBS    watches diet  . Hyperlipidemia   . Hypertension   . Hypothyroidism   . Obese   . Plantar fasciitis   . Seasonal allergies   . Sleep apnea 2017   uses CPAP   . Spinal stenosis of lumbar region   . SVD (spontaneous vaginal delivery)    x 2  . Systemic lupus ([place])   . Thyroid disease     Allergies Allergies  Allergen Reactions  . Ibuprofen Diarrhea, Nausea Only and Other (See Comments)    Extreme stomach pain  Makes her feel bad  . Codeine     UNSPECIFIED REACTION   . Nabumetone     UNSPECIFIED REACTION   . Promethazine     UNSPECIFIED REACTION   . Amoxicillin-Pot Clavulanate Nausea And Vomiting  . Erythromycin Base Diarrhea  . Promethazine-Codeine Other (See Comments)    "  Makes her feel bad"    IV Location/Drains/Wounds Patient Lines/Drains/Airways Status   Active Line/Drains/Airways    Name:   Placement date:   Placement time:   Site:   Days:   Peripheral IV 12/06/18 Left Antecubital   12/06/18    1516    Antecubital   less than 1   External Urinary Catheter   12/01/18    1900    -   5   Incision (Closed) 09/16/17 Hand Left   09/16/17    1610     446          Labs/Imaging Results for orders placed or performed during the hospital encounter of 12/06/18 (from the past 48 hour(s))  Basic metabolic panel     Status: Abnormal   Collection Time: 12/06/18  3:14 PM  Result Value Ref Range   Sodium 138 135 - 145 mmol/L   Potassium 4.3 3.5 - 5.1 mmol/L   Chloride 93  (L) 98 - 111 mmol/L   CO2 36 (H) 22 - 32 mmol/L   Glucose, Bld 118 (H) 70 - 99 mg/dL   BUN 36 (H) 8 - 23 mg/dL   Creatinine, Ser 1.09 (H) 0.44 - 1.00 mg/dL   Calcium 11.1 (H) 8.9 - 10.3 mg/dL   GFR calc non Af Amer 52 (L) >60 mL/min   GFR calc Af Amer 60 (L) >60 mL/min   Anion gap 9 5 - 15    Comment: Performed at [facility], [place] [address] [place], [place] [address]  Brain natriuretic peptide     Status: None   Collection Time: 12/06/18  3:14 PM  Result Value Ref Range   B Natriuretic Peptide 82.5 0.0 - 100.0 pg/mL    Comment: Performed at [facility], [place] [address], [place], [place] [address]  CBC with Differential     Status: Abnormal   Collection Time: 12/06/18  3:14 PM  Result Value Ref Range   WBC 16.5 (H) 4.0 - 10.5 K/uL   RBC 4.33 3.87 - 5.11 MIL/uL   Hemoglobin 13.3 12.0 - 15.0 g/dL   HCT 46.9 (H) 36.0 - 46.0 %   MCV 108.3 (H) 80.0 - 100.0 fL   MCH 30.7 26.0 - 34.0 pg   MCHC 28.4 (L) 30.0 - 36.0 g/dL   RDW 13.6 11.5 - 15.5 %   Platelets 348 150 - 400 K/uL   nRBC 0.0 0.0 - 0.2 %   Neutrophils Relative % 76 %   Neutro Abs 12.7 (H) 1.7 - 7.7 K/uL   Lymphocytes Relative 8 %   Lymphs Abs 1.2 0.7 - 4.0 K/uL   Monocytes Relative 13 %   Monocytes Absolute 2.1 (H) 0.1 - 1.0 K/uL   Eosinophils Relative 0 %   Eosinophils Absolute 0.1 0.0 - 0.5 K/uL   Basophils Relative 1 %   Basophils Absolute 0.1 0.0 - 0.1 K/uL   Immature Granulocytes 2 %   Abs Immature Granulocytes 0.33 (H) 0.00 - 0.07 K/uL    Comment: Performed at [facility], [place] [address], [place], [place] [address]  Troponin I (High Sensitivity)     Status: Abnormal   Collection Time: 12/06/18  3:14 PM  Result Value Ref Range   Troponin I (High Sensitivity) 24 (H) <18 ng/L    Comment: (NOTE) Elevated high sensitivity troponin I (hsTnI) values and significant  changes across serial measurements may suggest ACS but many other  chronic and acute  conditions are known  to elevate hsTnI results.  Refer to the "Links" section for chest pain algorithms and additional  guidance. Performed at [facility], [place] [address], [place], [place] [address]   Blood gas, arterial (at [facility])     Status: Abnormal   Collection Time: 12/06/18  3:27 PM  Result Value Ref Range   O2 Content 4.0 L/min   pH, Arterial 7.283 (L) 7.350 - 7.450   pCO2 arterial 82.8 (HH) 32.0 - 48.0 mmHg    Comment: RBV DR.HAVILAND,MD BY LISA CRADDOCK,RRT,RCP ON 12/06/2018 AT 1540   pO2, Arterial 89.6 83.0 - 108.0 mmHg   Bicarbonate 37.9 (H) 20.0 - 28.0 mmol/L   Acid-Base Excess 8.3 (H) 0.0 - 2.0 mmol/L   O2 Saturation 96.5 %   Patient temperature 37.0    Collection site BRACHIAL ARTERY    Drawn by [id number]    Allens test (pass/fail) PASS PASS    Comment: Performed at [facility], [place] [address] [place],  [address]  SARS Coronavirus 2 [facility] order, Performed in [facility] hospital lab) Nasopharyngeal Nasopharyngeal Swab     Status: None   Collection Time: 12/06/18  4:46 PM   Specimen: Nasopharyngeal Swab  Result Value Ref Range   SARS Coronavirus 2 NEGATIVE NEGATIVE    Comment: (NOTE) If result is NEGATIVE SARS-CoV-2 target nucleic acids are NOT DETECTED. The SARS-CoV-2 RNA is generally detectable in upper and lower  respiratory specimens during the acute phase of infection. The lowest  concentration of SARS-CoV-2 viral copies this assay can detect is 250  copies / mL. A negative result does not preclude SARS-CoV-2 infection  and should not be used as the sole basis for treatment or other  patient management decisions.  A negative result may occur with  improper specimen collection / handling, submission of specimen other  than nasopharyngeal swab, presence of viral mutation(s) within the  areas targeted by this assay, and inadequate number of viral copies  (<250 copies / mL). A negative result must be combined with  clinical  observations, patient history, and epidemiological information. If result is POSITIVE SARS-CoV-2 target nucleic acids are DETECTED. The SARS-CoV-2 RNA is generally detectable in upper and lower  respiratory specimens dur ing the acute phase of infection.  Positive  results are indicative of active infection with SARS-CoV-2.  Clinical  correlation with patient history and other diagnostic information is  necessary to determine patient infection status.  Positive results do  not rule out bacterial infection or co-infection with other viruses. If result is PRESUMPTIVE POSTIVE SARS-CoV-2 nucleic acids MAY BE PRESENT.   A presumptive positive result was obtained on the submitted specimen  and confirmed on repeat testing.  While 2019 novel coronavirus  (SARS-CoV-2) nucleic acids may be present in the submitted sample  additional confirmatory testing may be necessary for epidemiological  and / or clinical management purposes  to differentiate between  SARS-CoV-2 and other Sarbecovirus currently known to infect humans.  If clinically indicated additional testing with an alternate test  methodology [phone]) is advised. The SARS-CoV-2 RNA is generally  detectable in upper and lower respiratory sp ecimens during the acute  phase of infection. The expected result is Negative. Fact Sheet for Patients:  StrictlyIdeas.no Fact Sheet for Healthcare Providers: BankingDealers.co.za This test is not yet approved or cleared by the [place] FDA and has been authorized for detection and/or diagnosis of SARS-CoV-2 by FDA under an Emergency Use Authorization (EUA).  This EUA will remain in effect (  meaning this test can be used) for the duration of the COVID-19 declaration under Section 564(b)(1) of the Act, 21 U.S.C. section 360bbb-3(b)(1), unless the authorization is terminated or revoked sooner. Performed at [facility],  [place] [address] [place], [place] [address]   Troponin I (High Sensitivity)     Status: Abnormal   Collection Time: 12/06/18  5:05 PM  Result Value Ref Range   Troponin I (High Sensitivity) 28 (H) <18 ng/L    Comment: (NOTE) Elevated high sensitivity troponin I (hsTnI) values and significant  changes across serial measurements may suggest ACS but many other  chronic and acute conditions are known to elevate hsTnI results.  Refer to the "Links" section for chest pain algorithms and additional  guidance. Performed at [facility], [place] [address], [place], [place] [address]   Lithium level     Status: None   Collection Time: 12/06/18  5:05 PM  Result Value Ref Range   Lithium Lvl 1.14 0.60 - 1.20 mmol/L    Comment: Performed at [facility], [place] [address], [place], [place] [address]   Dg Chest Port 1 View  Result Date: 12/06/2018 CLINICAL DATA:  Shortness of breath. EXAM: PORTABLE CHEST 1 VIEW COMPARISON:  Chest x-rays dated 11/16/2018 and 04/14/2015 FINDINGS: Persistent cardiomegaly. Increased pulmonary vascular prominence particularly of the main pulmonary arteries. Increased density at the lung bases may represent small effusions or slight pulmonary edema. Haziness at the aortic arch probably represents edema or atelectasis. No acute bone abnormality. IMPRESSION: Findings consistent with congestive heart failure. Electronically Signed   By: Lorriane Shire M.D.   On: 12/06/2018 16:15    Pending Labs [employer] (From admission, onward)    Start     Ordered   Signed and Held  CBC  (enoxaparin (LOVENOX)    CrCl >/= 30 ml/min)  Once,   R    Comments: Baseline for enoxaparin therapy IF NOT ALREADY DRAWN.  Notify MD if PLT < 100 K.    Signed and Held   Signed and Held  Creatinine, serum  (enoxaparin (LOVENOX)    CrCl >/= 30 ml/min)  Once,   R    Comments: Baseline for enoxaparin therapy IF NOT ALREADY DRAWN.    Signed and Held   Signed and  Held  Creatinine, serum  (enoxaparin (LOVENOX)    CrCl >/= 30 ml/min)  Weekly,   R    Comments: while on enoxaparin therapy    Signed and Held   Signed and Held  CBC  Tomorrow morning,   R     Signed and Held   Signed and Held  Comprehensive metabolic panel  Tomorrow morning,   R     Signed and Held   Signed and Held  Magnesium  Tomorrow morning,   R     Signed and Held          Vitals/Pain Today's Vitals   12/06/18 1603 12/06/18 1700 12/06/18 1730 12/06/18 1800  BP:  (!) 151/67 (!) 154/80 (!) 154/75  Pulse: 92 90 94 96  Resp: 16 15 (!) 23 (!) 22  Temp:      TempSrc:      SpO2: 97% 94% 98% 95%  PainSc:        Isolation Precautions No active isolations  Medications Medications  furosemide (LASIX) injection 40 mg (has no administration in time range)  hydrALAZINE (APRESOLINE) injection 10 mg (has no administration in time range)  furosemide (LASIX) injection  80 mg (80 mg Intravenous Given 12/06/18 1703)    Mobility non-ambulatory

## 2018-12-06 NOTE — Progress Notes (Signed)
Pt placed on BIPAP due to ABG.

## 2018-12-06 NOTE — ED Provider Notes (Signed)
[facility] DEPT Provider Note   CSN: [id number] Arrival date & time: 12/06/18  1421    History   Chief Complaint Chief Complaint  Patient presents with  . Fatigue  . Hypoxia    HPI Jessica [name] is a 68 y.o. female.     Pt presents to the Ed today with sob.  Pt is unable to give any hx. Pt was d/c a few days ago after an admission for CHF and bilateral LE cellulitis.  She was d/c to home.  She is unable to give any hx.  Per EMS, she's been sob since d/c.  She has chronic resp failure and is on 4L normally.  EMS put her on 15L NRB.  When she arrived, we turned it back down to 4L and she is saturating 100%.  I was able to get more history from the husband.  He said she'd been doing well until today.  She was difficult to arouse and was very confused today.       Past Medical History:  Diagnosis Date  . Arthritis    lower back, hands, knees  . Bipolar disorder ([place])   . Cellulitis and abscess of left leg 11/29/2018  . Depression   . Dyspnea    with exertion  . GERD (gastroesophageal reflux disease)    patient unsure about this dx - no meds  . Headache   . History of IBS    watches diet  . Hyperlipidemia   . Hypertension   . Hypothyroidism   . Obese   . Plantar fasciitis   . Seasonal allergies   . Sleep apnea 2017   uses CPAP   . Spinal stenosis of lumbar region   . SVD (spontaneous vaginal delivery)    x 2  . Systemic lupus ([place])   . Thyroid disease     Patient Active Problem List   Diagnosis Date Noted  . Lower extremity edema 11/30/2018  . Cellulitis of left leg 11/30/2018  . Systemic lupus ([place])   . Obese   . Bipolar disorder ([place])   . Cellulitis 11/29/2018  . OSA on CPAP 11/27/2018  . Chronic hypoxemic respiratory failure ([place]) 11/27/2018  . Primary osteoarthritis of both hands 04/19/2017  . Primary osteoarthritis of both knees 04/19/2017  . Primary osteoarthritis of both feet 04/19/2017  . DDD (degenerative disc  disease), lumbar 04/19/2017  . History of bipolar disorder 04/19/2017  . Hypothyroidism 06/04/2014  . Chest pain 01/23/2013  . ARF (acute renal failure) ([place]) 01/23/2013  . HTN (hypertension) 01/23/2013  . Leukocytosis 01/23/2013  . HLD (hyperlipidemia) 01/23/2013    Past Surgical History:  Procedure Laterality Date  . CARPAL TUNNEL RELEASE Left 09/16/2017   Procedure: LEFT CARPAL TUNNEL RELEASE;  Surgeon: Daryll Brod, MD;  Location: [facility];  Service: Orthopedics;  Laterality: Left;  . carpel tunnel surgery Right   . CATARACT EXTRACTION Bilateral 2007   w/ lens implants  . COLONOSCOPY    . DILATION AND CURETTAGE OF UTERUS    . EYE SURGERY    . FOOT SURGERY Right    hammer toe  . FRACTURE SURGERY     R tibia and fibula  . LEG SURGERY Right   . TONSILLECTOMY    . WISDOM TOOTH EXTRACTION       OB History   No obstetric history on file.      Home Medications    Prior to Admission medications   Medication Sig Start Date End  Date Taking? Authorizing Provider  acetaminophen (TYLENOL ARTHRITIS PAIN) 650 MG CR tablet Take 1,300 mg by mouth 3 (three) times daily.     [provider]  Acetylcysteine (NAC) 600 MG CAPS Take 600 mg by mouth 2 (two) times daily.    [provider]  albuterol (VENTOLIN HFA) 108 (90 Base) MCG/ACT inhaler Inhale 2 puffs into the lungs every 6 (six) hours as needed for wheezing or shortness of breath. 11/22/18   Margaretha Seeds, MD  amLODipine (NORVASC) 5 MG tablet Take 5 mg at bedtime by mouth. 01/19/17   [provider]  Carboxymethylcellul-Glycerin (REFRESH OPTIVE OP) Place 1-2 drops into both eyes daily as needed (dry eyes).     [provider]  celecoxib (CELEBREX) 200 MG capsule TAKE 1 CAPSULE BY MOUTH EVERY DAY Patient taking differently: Take 200 mg by mouth daily.  11/14/18   Bo Merino, MD  clotrimazole-betamethasone (LOTRISONE) cream Apply 1 application topically 2 (two) times daily as needed (rash).     [provider]  cycloSPORINE (RESTASIS) 0.05 % ophthalmic emulsion Place 1 drop into both eyes 2 (two) times daily.    [provider]  doxycycline (ADOXA) 100 MG tablet Take 1 tablet (100 mg total) by mouth 2 (two) times daily for 4 days. 12/03/18 12/07/18  Shelly Coss, MD  DULoxetine (CYMBALTA) 60 MG capsule TAKE 1 CAPSULE BY MOUTH EVERY DAY IN THE MORNING Patient taking differently: Take 60 mg by mouth daily.  11/01/18   Cottle, Billey Co., MD  econazole nitrate 1 % cream Apply 1 application topically 3 (three) times daily as needed (fungus).  10/07/18   [provider]  fluticasone (FLONASE) 50 MCG/ACT nasal spray Place 1 spray into both nostrils daily.    [provider]  furosemide (LASIX) 40 MG tablet Take two pills twice a day for a week then continue taking one pill a day 12/03/18   Shelly Coss, MD  gabapentin (NEURONTIN) 300 MG capsule Take 600 mg by mouth at bedtime.     [provider]  HYDROcodone-acetaminophen (NORCO) 5-325 MG tablet Take 1 tablet by mouth every 6 (six) hours as needed for moderate pain. 09/16/17   Daryll Brod, MD  ketoconazole (NIZORAL) 2 % cream Apply 1 application topically 3 (three) times daily as needed for irritation.    [provider]  levothyroxine (SYNTHROID) 25 MCG tablet Take 225 mcg by mouth daily before breakfast.     [provider]  levothyroxine (SYNTHROID, LEVOTHROID) 200 MCG tablet Take 225 mcg by mouth daily before breakfast.     [provider]  lisdexamfetamine (VYVANSE) 60 MG capsule Take 1 capsule (60 mg total) by mouth every morning. 11/29/18   Cottle, Billey Co., MD  lithium carbonate (LITHOBID) 300 MG CR tablet 300mg  in the AM and 600mg  (take an additional 150mg  capsule)at bedtime. 10/16/18   Cottle, Billey Co., MD  lithium carbonate 150 MG capsule TAKE 1 CAPSULE BY MOUTH AT BEDTIME. TAKE WITH 600MG  LITHIUM CARBONATE AT BEDTIME TO EQUAL 750MG  Patient taking differently:  Take 750 mg by mouth at bedtime. Take along with 300 mg tablet 10/23/18   Cottle, Billey Co., MD  loratadine (CLARITIN) 10 MG tablet Take 10 mg by mouth daily as needed for allergies.     [provider]  LORazepam (ATIVAN) 0.5 MG tablet Take 0.5 mg by mouth every 6 (six) hours as needed for anxiety.  09/12/17   [provider]  methocarbamol (ROBAXIN) 500 MG  tablet TAKE 1/2 TO 1 TABLET BY MOUTH UP TO 3 TIMES DAILY AS NEEDED FOR MUSCLE SPASMS Patient taking differently: Take 250-500 mg by mouth every 8 (eight) hours as needed for muscle spasms.  09/04/18   Bo Merino, MD  mirabegron ER (MYRBETRIQ) 50 MG TB24 tablet Take 50 mg at bedtime by mouth.  01/06/16   [provider]  montelukast (SINGULAIR) 10 MG tablet Take 1 tablet by mouth daily. 11/10/18   [provider]  Multiple Vitamins-Minerals (MULTIVITAMIN WITH MINERALS) tablet Take 1 tablet by mouth daily.    [provider]  Multiple Vitamins-Minerals (PRESERVISION AREDS 2) CAPS Take 1 capsule 2 (two) times daily by mouth.    [provider]  nystatin (MYCOSTATIN/NYSTOP) powder Apply topically 2 (two) times daily for 7 days. 12/03/18 12/10/18  Shelly Coss, MD  potassium chloride (KLOR-CON) 20 MEQ packet Take 20 mEq by mouth daily. 12/03/18 01/02/19  Shelly Coss, MD  pravastatin (PRAVACHOL) 20 MG tablet Take 20 mg at bedtime by mouth.     [provider]  Sodium Chloride, Hypertonic, (MURO 128 OP) Place 1 drop into both eyes 3 (three) times daily.     [provider]  Thiamine HCl (VITAMIN B1) 100 MG TABS Take 1 tablet by mouth daily. 10/20/18   [provider]  valsartan (DIOVAN) 80 MG tablet Take 80 mg by mouth daily.    [provider]  vitamin B-12 (CYANOCOBALAMIN) 100 MCG tablet Take 100 mcg by mouth 2 (two) times daily. 10/20/18   [provider]  Vitamin D, Ergocalciferol, (DRISDOL) 1.25 MG (50000 UT) CAPS capsule Take 50,000 Units by mouth  every 7 (seven) days.    [provider]    Family History Family History  Problem Relation Age of Onset  . Thyroid disease Mother   . Breast cancer Mother   . Heart disease Mother   . Bipolar disorder Father   . Diabetes Father   . Heart disease Father   . Dementia Father     Social History Social History   Tobacco Use  . Smoking status: Never Smoker  . Smokeless tobacco: Never Used  Substance Use Topics  . Alcohol use: Yes    Comment: rarely  . Drug use: Never     Allergies   Ibuprofen, Codeine, Nabumetone, Promethazine, Amoxicillin-pot clavulanate, Erythromycin base, and Promethazine-codeine   Review of Systems Review of Systems  Unable to perform ROS: Mental status change     Physical Exam Updated Vital Signs BP (!) 163/88   Pulse 92   Temp 98.8 F (37.1 C) (Oral)   Resp 16   SpO2 97%   Physical Exam Vitals signs and nursing note reviewed.  Constitutional:      Appearance: She is obese.     Comments: somnolent  HENT:     Head: Normocephalic and atraumatic.     Right Ear: External ear normal.     Left Ear: External ear normal.     Nose: Nose normal.     Mouth/Throat:     Mouth: Mucous membranes are moist.     Pharynx: Oropharynx is clear.  Eyes:     Extraocular Movements: Extraocular movements intact.     Conjunctiva/sclera: Conjunctivae normal.     Pupils: Pupils are equal, round, and reactive to light.  Neck:     Musculoskeletal: Normal range of motion and neck supple.  Cardiovascular:     Rate and Rhythm: Normal rate and regular rhythm.     Pulses:  Normal pulses.     Heart sounds: Normal heart sounds.  Pulmonary:     Effort: Pulmonary effort is normal.     Breath sounds: Normal breath sounds.  Abdominal:     General: Abdomen is flat. Bowel sounds are normal.     Palpations: Abdomen is soft.  Musculoskeletal: Normal range of motion.     Right lower leg: Edema present.     Left lower leg: Edema present.  Skin:    General:  Skin is warm.     Capillary Refill: Capillary refill takes less than 2 seconds.  Neurological:     Comments: Pt will wake up briefly to verbal stimuli, but then will go back to sleep.  She is not following commands.      ED Treatments / Results  Labs (all labs ordered are listed, but only abnormal results are displayed) Labs Reviewed  BASIC METABOLIC PANEL - Abnormal; Notable for the following components:      Result Value   Chloride 93 (*)    CO2 36 (*)    Glucose, Bld 118 (*)    BUN 36 (*)    Creatinine, Ser 1.09 (*)    Calcium 11.1 (*)    GFR calc non Af Amer 52 (*)    GFR calc Af Amer 60 (*)    All other components within normal limits  CBC WITH DIFFERENTIAL/PLATELET - Abnormal; Notable for the following components:   WBC 16.5 (*)    HCT 46.9 (*)    MCV 108.3 (*)    MCHC 28.4 (*)    Neutro Abs 12.7 (*)    Monocytes Absolute 2.1 (*)    Abs Immature Granulocytes 0.33 (*)    All other components within normal limits  BLOOD GAS, ARTERIAL - Abnormal; Notable for the following components:   pH, Arterial 7.283 (*)    pCO2 arterial 82.8 (*)    Bicarbonate 37.9 (*)    Acid-Base Excess 8.3 (*)    All other components within normal limits  TROPONIN I (HIGH SENSITIVITY) - Abnormal; Notable for the following components:   Troponin I (High Sensitivity) 24 (*)    All other components within normal limits  SARS CORONAVIRUS 2 (HOSPITAL ORDER, [facility])  BRAIN NATRIURETIC PEPTIDE  LITHIUM LEVEL  TROPONIN I (HIGH SENSITIVITY)    EKG EKG Interpretation  Date/Time:  Wednesday December 06 2018 14:35:51 EDT Ventricular Rate:  98 PR Interval:    QRS Duration: 88 QT Interval:  350 QTC Calculation: 447 R Axis:   8 Text Interpretation:  Sinus rhythm Probable left atrial enlargement Borderline low voltage, extremity leads No significant change since last tracing Confirmed by Isla Pence [phone]) on 12/06/2018 3:11:10 PM   Radiology Dg Chest Port 1 View   Result Date: 12/06/2018 CLINICAL DATA:  Shortness of breath. EXAM: PORTABLE CHEST 1 VIEW COMPARISON:  Chest x-rays dated 11/16/2018 and 04/14/2015 FINDINGS: Persistent cardiomegaly. Increased pulmonary vascular prominence particularly of the main pulmonary arteries. Increased density at the lung bases may represent small effusions or slight pulmonary edema. Haziness at the aortic arch probably represents edema or atelectasis. No acute bone abnormality. IMPRESSION: Findings consistent with congestive heart failure. Electronically Signed   By: Lorriane Shire M.D.   On: 12/06/2018 16:15    Procedures Procedures (including critical care time)  Medications Ordered in ED Medications  furosemide (LASIX) injection 80 mg (has no administration in time range)     Initial Impression / Assessment and Plan / ED  Course  I have reviewed the triage vital signs and the nursing notes.  Pertinent labs & imaging results that were available during my care of the patient were reviewed by me and considered in my medical decision making (see chart for details).    Pt placed on bipap upon arrival.  This has helped a small amount.  I spoke with pt's husband regarding intubation.  He said she would not want that even if it meant that she could die.  He wants to continue with just bipap for now.    Pt also has some chf on cxr, so she was given iv lasix.  Covid pending, but she was negative for covid on 7/29.   The pt was d/w Dr. Erlinda Hong ([facility]) for admission.  CRITICAL CARE Performed by: Isla Pence   Total critical care time: 30 minutes  Critical care time was exclusive of separately billable procedures and treating other patients.  Critical care was necessary to treat or prevent imminent or life-threatening deterioration.  Critical care was time spent personally by me on the following activities: development of treatment plan with patient and/or surrogate as well as nursing, discussions with consultants,  evaluation of patient's response to treatment, examination of patient, obtaining history from patient or surrogate, ordering and performing treatments and interventions, ordering and review of laboratory studies, ordering and review of radiographic studies, pulse oximetry and re-evaluation of patient's condition.  Jessica [name] was evaluated in Emergency Department on 12/06/2018 for the symptoms described in the history of present illness. She was evaluated in the context of the global COVID-19 pandemic, which necessitated consideration that the patient might be at risk for infection with the SARS-CoV-2 virus that causes COVID-19. Institutional protocols and algorithms that pertain to the evaluation of patients at risk for COVID-19 are in a state of rapid change based on information released by regulatory bodies including the CDC and federal and state organizations. These policies and algorithms were followed during the patient's care in the ED.   Final Clinical Impressions(s) / ED Diagnoses   Final diagnoses:  Acute on chronic respiratory failure with hypercapnia (HCC)  Acute on chronic congestive heart failure, unspecified heart failure type (HCC)  OSA (obstructive sleep apnea)    ED Discharge Orders    None       Isla Pence, MD 12/06/18 1656

## 2018-12-07 LAB — CBC
HCT: 45.2 % (ref 36.0–46.0)
Hemoglobin: 12.9 g/dL (ref 12.0–15.0)
MCH: 30.6 pg (ref 26.0–34.0)
MCHC: 28.5 g/dL — ABNORMAL LOW (ref 30.0–36.0)
MCV: 107.4 fL — ABNORMAL HIGH (ref 80.0–100.0)
Platelets: 340 10*3/uL (ref 150–400)
RBC: 4.21 MIL/uL (ref 3.87–5.11)
RDW: 13.6 % (ref 11.5–15.5)
WBC: 14.2 10*3/uL — ABNORMAL HIGH (ref 4.0–10.5)
nRBC: 0 % (ref 0.0–0.2)

## 2018-12-07 LAB — BLOOD GAS, ARTERIAL
Acid-Base Excess: 13.6 mmol/L — ABNORMAL HIGH (ref 0.0–2.0)
Bicarbonate: 40.6 mmol/L — ABNORMAL HIGH (ref 20.0–28.0)
Drawn by: 295031
O2 Content: 3 L/min
O2 Saturation: 92.8 %
Patient temperature: 98.6
pCO2 arterial: 61.1 mmHg — ABNORMAL HIGH (ref 32.0–48.0)
pH, Arterial: 7.438 (ref 7.350–7.450)
pO2, Arterial: 62.5 mmHg — ABNORMAL LOW (ref 83.0–108.0)

## 2018-12-07 LAB — COMPREHENSIVE METABOLIC PANEL
ALT: 19 U/L (ref 0–44)
AST: 22 U/L (ref 15–41)
Albumin: 3.4 g/dL — ABNORMAL LOW (ref 3.5–5.0)
Alkaline Phosphatase: 101 U/L (ref 38–126)
Anion gap: 10 (ref 5–15)
BUN: 32 mg/dL — ABNORMAL HIGH (ref 8–23)
CO2: 35 mmol/L — ABNORMAL HIGH (ref 22–32)
Calcium: 10.9 mg/dL — ABNORMAL HIGH (ref 8.9–10.3)
Chloride: 95 mmol/L — ABNORMAL LOW (ref 98–111)
Creatinine, Ser: 0.99 mg/dL (ref 0.44–1.00)
GFR calc Af Amer: >60 mL/min (ref >60)
GFR calc non Af Amer: 58 mL/min — ABNORMAL LOW (ref >60)
Glucose, Bld: 107 mg/dL — ABNORMAL HIGH (ref 70–99)
Potassium: 4.2 mmol/L (ref 3.5–5.1)
Sodium: 140 mmol/L (ref 135–145)
Total Bilirubin: 0.4 mg/dL (ref 0.3–1.2)
Total Protein: 6.4 g/dL — ABNORMAL LOW (ref 6.5–8.1)

## 2018-12-07 LAB — PROCALCITONIN: Procalcitonin: <0.1 ng/mL

## 2018-12-07 LAB — GLUCOSE, CAPILLARY: Glucose-Capillary: 110 mg/dL — ABNORMAL HIGH (ref 70–99)

## 2018-12-07 LAB — MAGNESIUM: Magnesium: 2.2 mg/dL (ref 1.7–2.4)

## 2018-12-07 LAB — MRSA PCR SCREENING: MRSA by PCR: NEGATIVE

## 2018-12-07 MED ORDER — IPRATROPIUM-ALBUTEROL 0.5-2.5 (3) MG/3ML IN SOLN
3.0000 mL | Freq: Four times a day (QID) | RESPIRATORY_TRACT | Status: DC
  Administered 2018-12-07 – 2018-12-10 (×13): 3 mL via RESPIRATORY_TRACT
  Filled 2018-12-07 (×14): qty 3

## 2018-12-07 MED ORDER — FUROSEMIDE 10 MG/ML IJ SOLN
60.0000 mg | Freq: Three times a day (TID) | INTRAMUSCULAR | Status: DC
  Administered 2018-12-07 – 2018-12-08 (×4): 60 mg via INTRAVENOUS
  Filled 2018-12-07 (×4): qty 6

## 2018-12-07 MED ORDER — FUROSEMIDE 10 MG/ML IJ SOLN
40.0000 mg | Freq: Three times a day (TID) | INTRAMUSCULAR | Status: DC
  Administered 2018-12-07: 40 mg via INTRAVENOUS
  Filled 2018-12-07: qty 4

## 2018-12-07 NOTE — Progress Notes (Signed)
PROGRESS NOTE  Jessica [name] JSE:[id number] DOB: 1951-04-16 DOA: 12/06/2018 PCP: Alroy Dust, L.Marlou Sa, MD  HPI/Recap of past 24 hours:  Jessica [name] is a 68 y.o. female   bipolar disorder, depression,  hypertension, hyperlipidemia, hypothyroidism, IBS, spinal stenosis, lupus, obesity OSA on cpap,  chronic hypoxemic respiratory failure on home oxygen (2 L at rest and 4 L with activity/sleep) who is recently discharged from hospital after treating acute on chronic diastolic chf and lower extremity cellulitis, returned to the ED due to lethargy, hypoxia, bilateral lower extremity edema. No reported fever. No reports of chest pain.  ED course: she is very lethargic, no fever, no tachycardia, bp elevated, no hypoxia at rest on 2liter o2 supplement, ABG showed significant co2 retention ( pco2 82.8) with acidosis (PH 7.283) ,she is edematous, cxr "Findings consistent with congestive heart failure". Wbc 16.5,  Cr 1.09. bnp 82, high sensitivity troponin 24, she received lasix 80mg  x1 in the ED and is put on bipap. Per husband patient is DNR/DNI, hospitalist called to admit the patient.   SARS cov2 screening negative in the ED.  12/07/18: Patient was seen and examined at her bedside this morning.  She is somnolent but easily arousable to voices on BiPAP.   Admission ABG showing significant elevation in PCO2 prior to BiPAP.  Repeat ABG this morning.   Assessment/Plan: Active Problems:   OSA (obstructive sleep apnea)   Morbid obesity (HCC)   Hypercapnic respiratory failure (HCC)   Acute metabolic encephalopathy likely multifactorial secondary to severe hypercapnia and hypoxia versus polypharmacy Presented with PCO2 greater than 80 on ABG, placed on BiPAP Repeat ABG this morning  Acute on chronic hypoxic hypercarbic respiratory failure likely multifactorial secondary to acute on chronic diastolic CHF versus worsening obesity hypoventilation syndrome On oxygen supplementation at baseline  Continue BiPAP due to hypercarbia Repeat ABG Hold BiPAP during the day if hypercarbia is improving and resume oral intake Continue BiPAP at night Maintain O2 saturation greater than 90% Start bronchodilators Continue IV Lasix due to pulmonary edema  Pulmonary edema likely cardiogenic secondary to acute on chronic diastolic CHF Personally reviewed chest x-ray done on admission which showed cardiomegaly with increase in pulmonary vascularity indicative of pulmonary edema Last 2D echo done on 12/02/2018 shows preserved LVEF 60 to 65% Continue strict I's and O's and daily weight Continue IV Lasix 60 mg 3 times daily Closely monitor electrolytes and renal function on IV diuretics Net I&O -1.5 L since admission  Uncontrolled hypertension On Norvasc 5 mg daily p.o. Lasix 40 mg twice daily and valsartan 80 mg daily at home Resume home medications when no longer n.p.o. Continue diuretics, IV Lasix 60 mg 3 times daily Continue to closely monitor vital signs  Chronic anxiety/depression/bipolar disorder Continue to hold off benzodiazepine for now Resume home medication when no longer n.p.o.  Hypothyroidism Resume Synthroid when no longer n.p.o.  Hyperlipidemia Resume atorvastatin when able to take p.o.  Recently treated bilateral lower extremity cellulitis T-max 99.7 overnight Leukocytosis this could be reactive Holding off antibiotics Restart antibiotics if no improvement Procalcitonin negative less than 0.10 Blood cultures in process  Severe morbid obesity with concern for obesity hypoventilation syndrome BMI 8 Recommend weight loss outpatient with regular physical activity and healthy dieting  OSA on CPAP BiPAP at night due to hypercarbia    Risks: Patient is high risk for decompensation due to acute on chronic hypercarbic hypoxic respiratory failure requiring noninvasive mechanical ventilation, acute on chronic diastolic CHF, multiple comorbidities and advanced age.  Patient  will require at least 2 midnights for further evaluation and treatment of present condition.    DVT prophylaxis: lovenox subcu daily  Consultants: none  Code Status: DNR  Family Communication:   Will call family and give update.     Objective: Vitals:   12/07/18 0700 12/07/18 0800 12/07/18 0818 12/07/18 0900  BP: (!) 159/89 136/66    Pulse: 89 93    Resp: 17 12    Temp:  99.1 F (37.3 C)    TempSrc:  Axillary    SpO2: 98% 96% 98% 98%  Weight:        Intake/Output Summary (Last 24 hours) at 12/07/2018 1103 Last data filed at 12/07/2018 0655 Gross per 24 hour  Intake -  Output 1500 ml  Net -1500 ml   Filed Weights   12/07/18 0500  Weight: 134.8 kg    Exam:  . General: 68 y.o. year-old female severe morbid obese on BiPAP.  Somnolent but easily arousable to voices.   . Cardiovascular: Regular rate and rhythm with no rubs or gallops.  No thyromegaly or JVD noted.   Marland Kitchen Respiratory: Mild rales at bases.  No wheezes noted.  Poor inspiratory effort.  Abdomen: Soft nontender nondistended with normal bowel sounds x4 quadrants. . Musculoskeletal: Trace lower extremity edema.  Mild lower abdominal erythema.  2/4 pulses in all 4 extremities. Marland Kitchen Psychiatry: Unable to assess mood due to somnolence.   Data Reviewed: CBC: Recent Labs  Lab 12/02/18 0552 12/03/18 0523 12/06/18 1514 12/07/18 0204  WBC 15.0* 15.7* 16.5* 14.2*  NEUTROABS 11.2* 12.7* 12.7*  --   HGB 12.9 13.9 13.3 12.9  HCT 43.9 46.8* 46.9* 45.2  MCV 104.0* 104.2* 108.3* 107.4*  PLT 330 337 348 268   Basic Metabolic Panel: Recent Labs  Lab 12/02/18 0552 12/03/18 0523 12/06/18 1514 12/07/18 0204  NA 141 141 138 140  K 4.3 3.7 4.3 4.2  CL 96* 97* 93* 95*  CO2 35* 33* 36* 35*  GLUCOSE 129* 154* 118* 107*  BUN 13 13 36* 32*  CREATININE 0.91 0.87 1.09* 0.99  CALCIUM 10.9* 10.9* 11.1* 10.9*  MG  --   --   --  2.2   GFR: Estimated Creatinine Clearance: 71.1 mL/min (by C-G formula based on SCr of 0.99  mg/dL). Liver Function Tests: Recent Labs  Lab 12/07/18 0204  AST 22  ALT 19  ALKPHOS 101  BILITOT 0.4  PROT 6.4*  ALBUMIN 3.4*   No results for input(s): LIPASE, AMYLASE in the last 168 hours. No results for input(s): AMMONIA in the last 168 hours. Coagulation Profile: No results for input(s): INR, PROTIME in the last 168 hours. Cardiac Enzymes: No results for input(s): CKTOTAL, CKMB, CKMBINDEX, TROPONINI in the last 168 hours. BNP (last 3 results) No results for input(s): PROBNP in the last 8760 hours. HbA1C: No results for input(s): HGBA1C in the last 72 hours. CBG: No results for input(s): GLUCAP in the last 168 hours. Lipid Profile: No results for input(s): CHOL, HDL, LDLCALC, TRIG, CHOLHDL, LDLDIRECT in the last 72 hours. Thyroid Function Tests: No results for input(s): TSH, T4TOTAL, FREET4, T3FREE, THYROIDAB in the last 72 hours. Anemia Panel: No results for input(s): VITAMINB12, FOLATE, FERRITIN, TIBC, IRON, RETICCTPCT in the last 72 hours. Urine analysis:    Component Value Date/Time   COLORURINE STRAW (A) 12/06/2018 2051   APPEARANCEUR CLEAR 12/06/2018 2051   LABSPEC 1.006 12/06/2018 2051   PHURINE 6.0 12/06/2018 2051   GLUCOSEU NEGATIVE 12/06/2018 2051   HGBUR NEGATIVE  12/06/2018 2051   [place] 12/06/2018 2051   [place] 12/06/2018 2051   PROTEINUR NEGATIVE 12/06/2018 2051   NITRITE NEGATIVE 12/06/2018 2051   LEUKOCYTESUR NEGATIVE 12/06/2018 2051   Sepsis Labs: @LABRCNTIP (procalcitonin:4,lacticidven:4)  ) Recent Results (from the past 240 hour(s))  SARS Coronavirus 2 (CEPHEID - Performed in [place] hospital lab), Hosp Order     Status: None   Collection Time: 11/29/18 10:21 PM   Specimen: Nasopharyngeal Swab  Result Value Ref Range Status   SARS Coronavirus 2 NEGATIVE NEGATIVE Final    Comment: (NOTE) If result is NEGATIVE SARS-CoV-2 target nucleic acids are NOT DETECTED. The SARS-CoV-2 RNA is generally detectable in  upper and lower  respiratory specimens during the acute phase of infection. The lowest  concentration of SARS-CoV-2 viral copies this assay can detect is 250  copies / mL. A negative result does not preclude SARS-CoV-2 infection  and should not be used as the sole basis for treatment or other  patient management decisions.  A negative result may occur with  improper specimen collection / handling, submission of specimen other  than nasopharyngeal swab, presence of viral mutation(s) within the  areas targeted by this assay, and inadequate number of viral copies  (<250 copies / mL). A negative result must be combined with clinical  observations, patient history, and epidemiological information. If result is POSITIVE SARS-CoV-2 target nucleic acids are DETECTED. The SARS-CoV-2 RNA is generally detectable in upper and lower  respiratory specimens dur ing the acute phase of infection.  Positive  results are indicative of active infection with SARS-CoV-2.  Clinical  correlation with patient history and other diagnostic information is  necessary to determine patient infection status.  Positive results do  not rule out bacterial infection or co-infection with other viruses. If result is PRESUMPTIVE POSTIVE SARS-CoV-2 nucleic acids MAY BE PRESENT.   A presumptive positive result was obtained on the submitted specimen  and confirmed on repeat testing.  While 2019 novel coronavirus  (SARS-CoV-2) nucleic acids may be present in the submitted sample  additional confirmatory testing may be necessary for epidemiological  and / or clinical management purposes  to differentiate between  SARS-CoV-2 and other Sarbecovirus currently known to infect humans.  If clinically indicated additional testing with an alternate test  methodology [phone]) is advised. The SARS-CoV-2 RNA is generally  detectable in upper and lower respiratory sp ecimens during the acute  phase of infection. The expected result is  Negative. Fact Sheet for Patients:  StrictlyIdeas.no Fact Sheet for Healthcare Providers: BankingDealers.co.za This test is not yet approved or cleared by the [place] FDA and has been authorized for detection and/or diagnosis of SARS-CoV-2 by FDA under an Emergency Use Authorization (EUA).  This EUA will remain in effect (meaning this test can be used) for the duration of the COVID-19 declaration under Section 564(b)(1) of the Act, 21 U.S.C. section 360bbb-3(b)(1), unless the authorization is terminated or revoked sooner. Performed at [facility], [place] [address] [place], [place] [address]   Culture, blood (routine x 2)     Status: None   Collection Time: 11/30/18  2:43 PM   Specimen: BLOOD  Result Value Ref Range Status   Specimen Description BLOOD LEFT ANTECUBITAL  Final   Special Requests   Final    BOTTLES DRAWN AEROBIC AND ANAEROBIC Blood Culture adequate volume   Culture   Final    NO GROWTH 5 DAYS Performed at [facility], [place],  [place] [address]    Report Status 12/05/2018 FINAL  Final  Culture, blood (routine x 2)     Status: None   Collection Time: 11/30/18  2:52 PM   Specimen: BLOOD LEFT HAND  Result Value Ref Range Status   Specimen Description BLOOD LEFT HAND  Final   Special Requests   Final    BOTTLES DRAWN AEROBIC AND ANAEROBIC Blood Culture results may not be optimal due to an inadequate volume of blood received in culture bottles   Culture   Final    NO GROWTH 5 DAYS Performed at [facility], [place] [address], [place], [place] [address]    Report Status 12/05/2018 FINAL  Final  SARS Coronavirus 2 [facility] order, Performed in [facility] hospital lab) Nasopharyngeal Nasopharyngeal Swab     Status: None   Collection Time: 12/06/18  4:46 PM   Specimen: Nasopharyngeal Swab  Result Value Ref Range Status   SARS Coronavirus 2 NEGATIVE NEGATIVE Final    Comment: (NOTE) If  result is NEGATIVE SARS-CoV-2 target nucleic acids are NOT DETECTED. The SARS-CoV-2 RNA is generally detectable in upper and lower  respiratory specimens during the acute phase of infection. The lowest  concentration of SARS-CoV-2 viral copies this assay can detect is 250  copies / mL. A negative result does not preclude SARS-CoV-2 infection  and should not be used as the sole basis for treatment or other  patient management decisions.  A negative result may occur with  improper specimen collection / handling, submission of specimen other  than nasopharyngeal swab, presence of viral mutation(s) within the  areas targeted by this assay, and inadequate number of viral copies  (<250 copies / mL). A negative result must be combined with clinical  observations, patient history, and epidemiological information. If result is POSITIVE SARS-CoV-2 target nucleic acids are DETECTED. The SARS-CoV-2 RNA is generally detectable in upper and lower  respiratory specimens dur ing the acute phase of infection.  Positive  results are indicative of active infection with SARS-CoV-2.  Clinical  correlation with patient history and other diagnostic information is  necessary to determine patient infection status.  Positive results do  not rule out bacterial infection or co-infection with other viruses. If result is PRESUMPTIVE POSTIVE SARS-CoV-2 nucleic acids MAY BE PRESENT.   A presumptive positive result was obtained on the submitted specimen  and confirmed on repeat testing.  While 2019 novel coronavirus  (SARS-CoV-2) nucleic acids may be present in the submitted sample  additional confirmatory testing may be necessary for epidemiological  and / or clinical management purposes  to differentiate between  SARS-CoV-2 and other Sarbecovirus currently known to infect humans.  If clinically indicated additional testing with an alternate test  methodology [phone]) is advised. The SARS-CoV-2 RNA is generally   detectable in upper and lower respiratory sp ecimens during the acute  phase of infection. The expected result is Negative. Fact Sheet for Patients:  StrictlyIdeas.no Fact Sheet for Healthcare Providers: BankingDealers.co.za This test is not yet approved or cleared by the [place] FDA and has been authorized for detection and/or diagnosis of SARS-CoV-2 by FDA under an Emergency Use Authorization (EUA).  This EUA will remain in effect (meaning this test can be used) for the duration of the COVID-19 declaration under Section 564(b)(1) of the Act, 21 U.S.C. section 360bbb-3(b)(1), unless the authorization is terminated or revoked sooner. Performed at [facility], [place] [address], [place], [place] [address]   Culture, blood (routine x 2)  Status: None (Preliminary result)   Collection Time: 12/06/18  9:16 PM   Specimen: BLOOD RIGHT ARM  Result Value Ref Range Status   Specimen Description BLOOD RIGHT ARM  Final   Special Requests   Final    BOTTLES DRAWN AEROBIC ONLY Blood Culture adequate volume Performed at [facility], [address], [place], [place] [address]    Culture PENDING  Incomplete   Report Status PENDING  Incomplete      Studies: Dg Chest Port 1 View  Result Date: 12/06/2018 CLINICAL DATA:  Shortness of breath. EXAM: PORTABLE CHEST 1 VIEW COMPARISON:  Chest x-rays dated 11/16/2018 and 04/14/2015 FINDINGS: Persistent cardiomegaly. Increased pulmonary vascular prominence particularly of the main pulmonary arteries. Increased density at the lung bases may represent small effusions or slight pulmonary edema. Haziness at the aortic arch probably represents edema or atelectasis. No acute bone abnormality. IMPRESSION: Findings consistent with congestive heart failure. Electronically Signed   By: Lorriane Shire M.D.   On: 12/06/2018 16:15    Scheduled Meds: . chlorhexidine  15 mL Mouth Rinse BID   . cycloSPORINE  1 drop Both Eyes BID  . enoxaparin (LOVENOX) injection  40 mg Subcutaneous Q24H  . fluticasone  1 spray Each Nare Daily  . furosemide  40 mg Intravenous Q8H  . mouth rinse  15 mL Mouth Rinse q12n4p  . nystatin   Topical BID  . polyvinyl alcohol  2 drop Both Eyes QHS    Continuous Infusions:   LOS: 1 day     Kayleen Memos, MD [facility] Pager [phone]  If 7PM-7AM, please contact night-coverage [email or web address] Password [email or web address] 12/07/2018, 11:03 AM

## 2018-12-08 LAB — COMPREHENSIVE METABOLIC PANEL
ALT: 22 U/L (ref 0–44)
AST: 27 U/L (ref 15–41)
Albumin: 3.8 g/dL (ref 3.5–5.0)
Alkaline Phosphatase: 103 U/L (ref 38–126)
Anion gap: 12 (ref 5–15)
BUN: 32 mg/dL — ABNORMAL HIGH (ref 8–23)
CO2: 34 mmol/L — ABNORMAL HIGH (ref 22–32)
Calcium: 11.8 mg/dL — ABNORMAL HIGH (ref 8.9–10.3)
Chloride: 100 mmol/L (ref 98–111)
Creatinine, Ser: 1.08 mg/dL — ABNORMAL HIGH (ref 0.44–1.00)
GFR calc Af Amer: >60 mL/min (ref >60)
GFR calc non Af Amer: 52 mL/min — ABNORMAL LOW (ref >60)
Glucose, Bld: 143 mg/dL — ABNORMAL HIGH (ref 70–99)
Potassium: 3.5 mmol/L (ref 3.5–5.1)
Sodium: 146 mmol/L — ABNORMAL HIGH (ref 135–145)
Total Bilirubin: 0.5 mg/dL (ref 0.3–1.2)
Total Protein: 7.1 g/dL (ref 6.5–8.1)

## 2018-12-08 LAB — BASIC METABOLIC PANEL
Anion gap: 11 (ref 5–15)
BUN: 32 mg/dL — ABNORMAL HIGH (ref 8–23)
CO2: 36 mmol/L — ABNORMAL HIGH (ref 22–32)
Calcium: 11.5 mg/dL — ABNORMAL HIGH (ref 8.9–10.3)
Chloride: 100 mmol/L (ref 98–111)
Creatinine, Ser: 1 mg/dL (ref 0.44–1.00)
GFR calc Af Amer: >60 mL/min (ref >60)
GFR calc non Af Amer: 57 mL/min — ABNORMAL LOW (ref >60)
Glucose, Bld: 125 mg/dL — ABNORMAL HIGH (ref 70–99)
Potassium: 3.8 mmol/L (ref 3.5–5.1)
Sodium: 147 mmol/L — ABNORMAL HIGH (ref 135–145)

## 2018-12-08 MED ORDER — ADULT MULTIVITAMIN W/MINERALS CH
1.0000 | ORAL_TABLET | Freq: Every day | ORAL | Status: DC
  Administered 2018-12-08 – 2018-12-11 (×4): 1 via ORAL
  Filled 2018-12-08 (×4): qty 1

## 2018-12-08 MED ORDER — DULOXETINE HCL 30 MG PO CPEP
60.0000 mg | ORAL_CAPSULE | Freq: Every day | ORAL | Status: DC
  Administered 2018-12-08 – 2018-12-11 (×4): 60 mg via ORAL
  Filled 2018-12-08 (×4): qty 2

## 2018-12-08 MED ORDER — MONTELUKAST SODIUM 10 MG PO TABS
10.0000 mg | ORAL_TABLET | Freq: Every day | ORAL | Status: DC
  Administered 2018-12-08 – 2018-12-11 (×4): 10 mg via ORAL
  Filled 2018-12-08 (×4): qty 1

## 2018-12-08 MED ORDER — VITAMIN B-1 100 MG PO TABS
100.0000 mg | ORAL_TABLET | Freq: Every day | ORAL | Status: DC
  Administered 2018-12-08 – 2018-12-11 (×4): 100 mg via ORAL
  Filled 2018-12-08 (×4): qty 1

## 2018-12-08 MED ORDER — AMLODIPINE BESYLATE 5 MG PO TABS: 5.0000 mg | ORAL_TABLET | Freq: Every day | ORAL | Status: DC

## 2018-12-08 MED ORDER — LORAZEPAM 0.5 MG PO TABS: 0.5000 mg | ORAL_TABLET | Freq: Four times a day (QID) | ORAL | Status: DC | PRN

## 2018-12-08 MED ORDER — VITAMIN D (ERGOCALCIFEROL) 1.25 MG (50000 UNIT) PO CAPS
50000.0000 [IU] | ORAL_CAPSULE | ORAL | Status: DC
  Administered 2018-12-08: 50000 [IU] via ORAL
  Filled 2018-12-08: qty 1

## 2018-12-08 MED ORDER — PRAVASTATIN SODIUM 20 MG PO TABS
20.0000 mg | ORAL_TABLET | Freq: Every day | ORAL | Status: DC
  Administered 2018-12-08 – 2018-12-10 (×3): 20 mg via ORAL
  Filled 2018-12-08 (×3): qty 1

## 2018-12-08 MED ORDER — LABETALOL HCL 5 MG/ML IV SOLN
10.0000 mg | INTRAVENOUS | Status: DC | PRN
  Administered 2018-12-08: 10 mg via INTRAVENOUS
  Filled 2018-12-08: qty 4

## 2018-12-08 MED ORDER — LISDEXAMFETAMINE DIMESYLATE 30 MG PO CAPS
60.0000 mg | ORAL_CAPSULE | Freq: Every day | ORAL | Status: DC
  Administered 2018-12-08 – 2018-12-11 (×4): 60 mg via ORAL
  Filled 2018-12-08 (×4): qty 2

## 2018-12-08 MED ORDER — CHLORHEXIDINE GLUCONATE CLOTH 2 % EX PADS
6.0000 | MEDICATED_PAD | Freq: Every day | CUTANEOUS | Status: DC
  Administered 2018-12-09 – 2018-12-11 (×3): 6 via TOPICAL

## 2018-12-08 MED ORDER — AMLODIPINE BESYLATE 10 MG PO TABS
10.0000 mg | ORAL_TABLET | Freq: Every day | ORAL | Status: DC
  Administered 2018-12-08 – 2018-12-11 (×4): 10 mg via ORAL
  Filled 2018-12-08 (×4): qty 1

## 2018-12-08 MED ORDER — FUROSEMIDE 10 MG/ML IJ SOLN: 80.0000 mg | Freq: Three times a day (TID) | INTRAMUSCULAR | Status: DC

## 2018-12-08 MED ORDER — LORAZEPAM 0.5 MG PO TABS
0.5000 mg | ORAL_TABLET | Freq: Two times a day (BID) | ORAL | Status: DC | PRN
  Administered 2018-12-08 – 2018-12-09 (×2): 0.5 mg via ORAL
  Filled 2018-12-08 (×2): qty 1

## 2018-12-08 MED ORDER — LITHIUM CARBONATE 300 MG PO CAPS
750.0000 mg | ORAL_CAPSULE | Freq: Every day | ORAL | Status: DC
  Administered 2018-12-08 – 2018-12-10 (×3): 750 mg via ORAL
  Filled 2018-12-08 (×4): qty 1

## 2018-12-08 MED ORDER — LEVOTHYROXINE SODIUM 75 MCG PO TABS
225.0000 ug | ORAL_TABLET | Freq: Every day | ORAL | Status: DC
  Administered 2018-12-09 – 2018-12-11 (×3): 225 ug via ORAL
  Filled 2018-12-08 (×3): qty 3

## 2018-12-08 MED ORDER — VITAMIN B-12 100 MCG PO TABS
100.0000 ug | ORAL_TABLET | Freq: Two times a day (BID) | ORAL | Status: DC
  Administered 2018-12-08 – 2018-12-11 (×7): 100 ug via ORAL
  Filled 2018-12-08 (×8): qty 1

## 2018-12-08 MED ORDER — FUROSEMIDE 10 MG/ML IJ SOLN
60.0000 mg | Freq: Three times a day (TID) | INTRAMUSCULAR | Status: DC
  Administered 2018-12-08 – 2018-12-09 (×2): 60 mg via INTRAVENOUS
  Filled 2018-12-08 (×2): qty 6

## 2018-12-08 NOTE — Progress Notes (Addendum)
PROGRESS NOTE  Jessica [name] HBZ:[id number] DOB: 1950-08-13 DOA: 12/06/2018 PCP: Alroy Dust, L.Marlou Sa, MD  HPI/Recap of past 24 hours:  Jessica [name] is a 68 y.o. female   bipolar disorder, depression,  hypertension, hyperlipidemia, hypothyroidism, IBS, spinal stenosis, lupus, obesity OSA on cpap,  chronic hypoxemic respiratory failure on home oxygen (2 L at rest and 4 L with activity/sleep) who is recently discharged from hospital after treating acute on chronic diastolic chf and lower extremity cellulitis, returned to the ED due to lethargy, hypoxia, bilateral lower extremity edema. No reported fever. No reports of chest pain.  ED course: she is very lethargic, no fever, no tachycardia, bp elevated, no hypoxia at rest on 2liter o2 supplement, ABG showed significant co2 retention ( pco2 82.8) with acidosis (PH 7.283) ,she is edematous, cxr "Findings consistent with congestive heart failure". Wbc 16.5,  Cr 1.09. bnp 82, high sensitivity troponin 24, she received lasix 80mg  x1 in the ED and is put on bipap. Per husband patient is DNR/DNI, hospitalist called to admit the patient.   SARS cov2 screening negative in the ED.  Admitted for acute metabolic encephalopathy in the setting of acute on chronic hypoxic hypercarbic respiratory failure requiring noninvasive mechanical ventilation BiPAP.    12/08/18: Patient was seen and examined at bedside this morning.  She is a little bit more alert and interactive this morning.  She denies any chest pain.  She wore BiPAP overnight.  Still appears volume overload.  Ongoing diuresing.   Assessment/Plan: Active Problems:   OSA (obstructive sleep apnea)   Morbid obesity (HCC)   Hypercapnic respiratory failure (HCC)   Acute metabolic encephalopathy likely multifactorial secondary to severe hypercapnia and hypoxia versus polypharmacy Presented with PCO2 greater than 80 on ABG with low pH, placed on BiPAP Repeat ABG done on 12/07/2018 improved with PCO2  in the 60s and normal pH. Reorient as needed  Acute on chronic hypoxic hypercarbic respiratory failure likely multifactorial secondary to acute on chronic diastolic CHF versus worsening obesity hypoventilation syndrome On oxygen supplementation at baseline Continue BiPAP at night due to hypercarbia Nasal cannula during the day for oral intakes.   Continue bronchodilators and diuretics  Hypercalcemia, unclear etiology Calcium on 12/08/2018 11.5 Corrected calcium for albumin 14.4 We will repeat level this afternoon if persistently elevated will treat.  Pulmonary edema likely cardiogenic secondary to acute on chronic diastolic CHF Personally reviewed chest x-ray done on admission which showed cardiomegaly with increase in pulmonary vascularity indicative of pulmonary edema Last 2D echo done on 12/02/2018 shows preserved LVEF 60 to 65% Continue strict I's and O's and daily weight Continue IV Lasix 60 mg 3 times daily Closely monitor electrolytes and renal function on IV diuretics Net I&O -6.1 L since admission Creatinine 1.0 GFR 57 Potassium 3.8, sodium 147 from 140  Uncontrolled hypertension On Norvasc 5 mg daily p.o. Lasix 40 mg twice daily and valsartan 80 mg daily at home Increase Norvasc to 10 mg daily Continue IV diuretics Continue to closely monitor vital signs  Bilateral lower extremity edema with hyperpigmentation Suspect chronic venous stasis No warmth, no tenderness on palpation  Chronic anxiety/depression/bipolar disorder Continue to hold off benzodiazepine for now Resume home medications  Hypothyroidism Resume Synthroid when no longer n.p.o.  Hyperlipidemia Resume atorvastatin when able to take p.o.  Recently treated bilateral lower extremity cellulitis T-max 99.7 overnight Leukocytosis this could be reactive Holding off antibiotics Restart antibiotics if no improvement Procalcitonin negative less than 0.10 Blood cultures in process  Severe morbid obesity  with  concern for obesity hypoventilation syndrome BMI 60 Recommend weight loss outpatient with regular physical activity and healthy dieting  OSA on CPAP BiPAP at night due to hypercarbia   DVT prophylaxis: lovenox subcu daily  Consultants: none  Code Status: DNR  Family Communication:   Will call family and give update.  Disposition: Possible transfer to telemetry tomorrow 12/09/2018 when more stable.     Objective: Vitals:   12/08/18 0825 12/08/18 0900 12/08/18 1000 12/08/18 1200  BP:  (!) 148/106 (!) 179/114 (!) 175/97  Pulse:  94 92 95  Resp: (!) 23 (!) 21 (!) 21 (!) 26  Temp:    98.5 F (36.9 C)  TempSrc:    Oral  SpO2: 94% 90% 92% 92%  Weight:      Height:        Intake/Output Summary (Last 24 hours) at 12/08/2018 1237 Last data filed at 12/08/2018 0930 Gross per 24 hour  Intake -  Output 3900 ml  Net -3900 ml   Filed Weights   12/07/18 0500 12/07/18 1636 12/08/18 0500  Weight: 134.8 kg 134.8 kg 130 kg    Exam:  . General: 68 y.o. year-old female severe morbid obesity on BiPAP.  No acute distress.  Interactive.   . Cardiovascular: Regular rate and rhythm no rubs or gallops.  No JVD thyromegaly noted. Marland Kitchen Respiratory: Mild rales at bases.  No wheezes noted.  Poor inspiratory effort.  Abdomen: Obese nontender nondistended with normal bowel sounds present. . Musculoskeletal: Trace lower extremity edema with hyperpigmentation possibly secondary to venous stasis.  Denies tenderness on palpation.   Marland Kitchen Psychiatry: Mood is appropriate for condition and setting.   Data Reviewed: CBC: Recent Labs  Lab 12/02/18 0552 12/03/18 0523 12/06/18 1514 12/07/18 0204  WBC 15.0* 15.7* 16.5* 14.2*  NEUTROABS 11.2* 12.7* 12.7*  --   HGB 12.9 13.9 13.3 12.9  HCT 43.9 46.8* 46.9* 45.2  MCV 104.0* 104.2* 108.3* 107.4*  PLT 330 337 348 627   Basic Metabolic Panel: Recent Labs  Lab 12/02/18 0552 12/03/18 0523 12/06/18 1514 12/07/18 0204 12/08/18 0203  NA 141 141 138 140  147*  K 4.3 3.7 4.3 4.2 3.8  CL 96* 97* 93* 95* 100  CO2 35* 33* 36* 35* 36*  GLUCOSE 129* 154* 118* 107* 125*  BUN 13 13 36* 32* 32*  CREATININE 0.91 0.87 1.09* 0.99 1.00  CALCIUM 10.9* 10.9* 11.1* 10.9* 11.5*  MG  --   --   --  2.2  --    GFR: Estimated Creatinine Clearance: 68.8 mL/min (by C-G formula based on SCr of 1 mg/dL). Liver Function Tests: Recent Labs  Lab 12/07/18 0204  AST 22  ALT 19  ALKPHOS 101  BILITOT 0.4  PROT 6.4*  ALBUMIN 3.4*   No results for input(s): LIPASE, AMYLASE in the last 168 hours. No results for input(s): AMMONIA in the last 168 hours. Coagulation Profile: No results for input(s): INR, PROTIME in the last 168 hours. Cardiac Enzymes: No results for input(s): CKTOTAL, CKMB, CKMBINDEX, TROPONINI in the last 168 hours. BNP (last 3 results) No results for input(s): PROBNP in the last 8760 hours. HbA1C: No results for input(s): HGBA1C in the last 72 hours. CBG: Recent Labs  Lab 12/07/18 1844  GLUCAP 110*   Lipid Profile: No results for input(s): CHOL, HDL, LDLCALC, TRIG, CHOLHDL, LDLDIRECT in the last 72 hours. Thyroid Function Tests: No results for input(s): TSH, T4TOTAL, FREET4, T3FREE, THYROIDAB in the last 72 hours. Anemia Panel: No results  for input(s): VITAMINB12, FOLATE, FERRITIN, TIBC, IRON, RETICCTPCT in the last 72 hours. Urine analysis:    Component Value Date/Time   COLORURINE STRAW (A) 12/06/2018 2051   APPEARANCEUR CLEAR 12/06/2018 2051   LABSPEC 1.006 12/06/2018 2051   PHURINE 6.0 12/06/2018 2051   GLUCOSEU NEGATIVE 12/06/2018 2051   HGBUR NEGATIVE 12/06/2018 2051   [place] NEGATIVE 12/06/2018 2051   [place] NEGATIVE 12/06/2018 2051   PROTEINUR NEGATIVE 12/06/2018 2051   NITRITE NEGATIVE 12/06/2018 2051   LEUKOCYTESUR NEGATIVE 12/06/2018 2051   Sepsis Labs: @LABRCNTIP (procalcitonin:4,lacticidven:4)  ) Recent Results (from the past 240 hour(s))  SARS Coronavirus 2 (CEPHEID - Performed in [facility]  hospital lab), Hosp Order     Status: None   Collection Time: 11/29/18 10:21 PM   Specimen: Nasopharyngeal Swab  Result Value Ref Range Status   SARS Coronavirus 2 NEGATIVE NEGATIVE Final    Comment: (NOTE) If result is NEGATIVE SARS-CoV-2 target nucleic acids are NOT DETECTED. The SARS-CoV-2 RNA is generally detectable in upper and lower  respiratory specimens during the acute phase of infection. The lowest  concentration of SARS-CoV-2 viral copies this assay can detect is 250  copies / mL. A negative result does not preclude SARS-CoV-2 infection  and should not be used as the sole basis for treatment or other  patient management decisions.  A negative result may occur with  improper specimen collection / handling, submission of specimen other  than nasopharyngeal swab, presence of viral mutation(s) within the  areas targeted by this assay, and inadequate number of viral copies  (<250 copies / mL). A negative result must be combined with clinical  observations, patient history, and epidemiological information. If result is POSITIVE SARS-CoV-2 target nucleic acids are DETECTED. The SARS-CoV-2 RNA is generally detectable in upper and lower  respiratory specimens dur ing the acute phase of infection.  Positive  results are indicative of active infection with SARS-CoV-2.  Clinical  correlation with patient history and other diagnostic information is  necessary to determine patient infection status.  Positive results do  not rule out bacterial infection or co-infection with other viruses. If result is PRESUMPTIVE POSTIVE SARS-CoV-2 nucleic acids MAY BE PRESENT.   A presumptive positive result was obtained on the submitted specimen  and confirmed on repeat testing.  While 2019 novel coronavirus  (SARS-CoV-2) nucleic acids may be present in the submitted sample  additional confirmatory testing may be necessary for epidemiological  and / or clinical management purposes  to differentiate  between  SARS-CoV-2 and other Sarbecovirus currently known to infect humans.  If clinically indicated additional testing with an alternate test  methodology [phone]) is advised. The SARS-CoV-2 RNA is generally  detectable in upper and lower respiratory sp ecimens during the acute  phase of infection. The expected result is Negative. Fact Sheet for Patients:  StrictlyIdeas.no Fact Sheet for Healthcare Providers: BankingDealers.co.za This test is not yet approved or cleared by the [place] FDA and has been authorized for detection and/or diagnosis of SARS-CoV-2 by FDA under an Emergency Use Authorization (EUA).  This EUA will remain in effect (meaning this test can be used) for the duration of the COVID-19 declaration under Section 564(b)(1) of the Act, 21 U.S.C. section 360bbb-3(b)(1), unless the authorization is terminated or revoked sooner. Performed at [facility], [place] [address], [place], [place] [address]   Culture, blood (routine x 2)     Status: None   Collection Time: 11/30/18  2:43 PM   Specimen: BLOOD  Result  Value Ref Range Status   Specimen Description BLOOD LEFT ANTECUBITAL  Final   Special Requests   Final    BOTTLES DRAWN AEROBIC AND ANAEROBIC Blood Culture adequate volume   Culture   Final    NO GROWTH 5 DAYS Performed at [facility], [address], [place], [place] [address]    Report Status 12/05/2018 FINAL  Final  Culture, blood (routine x 2)     Status: None   Collection Time: 11/30/18  2:52 PM   Specimen: BLOOD LEFT HAND  Result Value Ref Range Status   Specimen Description BLOOD LEFT HAND  Final   Special Requests   Final    BOTTLES DRAWN AEROBIC AND ANAEROBIC Blood Culture results may not be optimal due to an inadequate volume of blood received in culture bottles   Culture   Final    NO GROWTH 5 DAYS Performed at [facility], [place] [address], [place], [place] [address]    Report  Status 12/05/2018 FINAL  Final  SARS Coronavirus 2 [facility] order, Performed in [facility] hospital lab) Nasopharyngeal Nasopharyngeal Swab     Status: None   Collection Time: 12/06/18  4:46 PM   Specimen: Nasopharyngeal Swab  Result Value Ref Range Status   SARS Coronavirus 2 NEGATIVE NEGATIVE Final    Comment: (NOTE) If result is NEGATIVE SARS-CoV-2 target nucleic acids are NOT DETECTED. The SARS-CoV-2 RNA is generally detectable in upper and lower  respiratory specimens during the acute phase of infection. The lowest  concentration of SARS-CoV-2 viral copies this assay can detect is 250  copies / mL. A negative result does not preclude SARS-CoV-2 infection  and should not be used as the sole basis for treatment or other  patient management decisions.  A negative result may occur with  improper specimen collection / handling, submission of specimen other  than nasopharyngeal swab, presence of viral mutation(s) within the  areas targeted by this assay, and inadequate number of viral copies  (<250 copies / mL). A negative result must be combined with clinical  observations, patient history, and epidemiological information. If result is POSITIVE SARS-CoV-2 target nucleic acids are DETECTED. The SARS-CoV-2 RNA is generally detectable in upper and lower  respiratory specimens dur ing the acute phase of infection.  Positive  results are indicative of active infection with SARS-CoV-2.  Clinical  correlation with patient history and other diagnostic information is  necessary to determine patient infection status.  Positive results do  not rule out bacterial infection or co-infection with other viruses. If result is PRESUMPTIVE POSTIVE SARS-CoV-2 nucleic acids MAY BE PRESENT.   A presumptive positive result was obtained on the submitted specimen  and confirmed on repeat testing.  While 2019 novel coronavirus  (SARS-CoV-2) nucleic acids may be present in the submitted sample  additional  confirmatory testing may be necessary for epidemiological  and / or clinical management purposes  to differentiate between  SARS-CoV-2 and other Sarbecovirus currently known to infect humans.  If clinically indicated additional testing with an alternate test  methodology [phone]) is advised. The SARS-CoV-2 RNA is generally  detectable in upper and lower respiratory sp ecimens during the acute  phase of infection. The expected result is Negative. Fact Sheet for Patients:  StrictlyIdeas.no Fact Sheet for Healthcare Providers: BankingDealers.co.za This test is not yet approved or cleared by the [place] FDA and has been authorized for detection and/or diagnosis of SARS-CoV-2 by FDA under an Emergency Use Authorization (EUA).  This  EUA will remain in effect (meaning this test can be used) for the duration of the COVID-19 declaration under Section 564(b)(1) of the Act, 21 U.S.C. section 360bbb-3(b)(1), unless the authorization is terminated or revoked sooner. Performed at [facility], [place] [address], [place], [place] [address]   Culture, blood (routine x 2)     Status: None (Preliminary result)   Collection Time: 12/06/18  9:08 PM   Specimen: BLOOD  Result Value Ref Range Status   Specimen Description   Final    BLOOD RIGHT ANTECUBITAL Performed at [place] [address] [place], [place] [address]    Special Requests   Final    BOTTLES DRAWN AEROBIC AND ANAEROBIC Blood Culture adequate volume Performed at [place] [address], [place], [place] [address]    Culture   Final    NO GROWTH 1 DAY Performed at [facility], [place] [address] [place], [place] [address]    Report Status PENDING  Incomplete  Culture, blood (routine x 2)     Status: None (Preliminary result)   Collection Time: 12/06/18  9:16 PM   Specimen: BLOOD RIGHT ARM  Result Value Ref Range  Status   Specimen Description BLOOD RIGHT ARM  Final   Special Requests   Final    BOTTLES DRAWN AEROBIC ONLY Blood Culture adequate volume   Culture   Final    NO GROWTH 1 DAY Performed at [facility], [place] [address] [place], [place] [address]    Report Status PENDING  Incomplete  MRSA PCR Screening     Status: None   Collection Time: 12/07/18  1:24 PM   Specimen: Nasal Mucosa; Nasopharyngeal  Result Value Ref Range Status   MRSA by PCR NEGATIVE NEGATIVE Final    Comment:        The GeneXpert MRSA Assay (FDA approved for NASAL specimens only), is one component of a comprehensive MRSA colonization surveillance program. It is not intended to diagnose MRSA infection nor to guide or monitor treatment for MRSA infections. Performed at [facility], [place] [address] [place], [place] [address]       Studies: No results found.  Scheduled Meds: . amLODipine  10 mg Oral Daily  . chlorhexidine  15 mL Mouth Rinse BID  . [START ON 12/09/2018] Chlorhexidine Gluconate Cloth  6 each Topical Daily  . cycloSPORINE  1 drop Both Eyes BID  . DULoxetine  60 mg Oral Daily  . enoxaparin (LOVENOX) injection  40 mg Subcutaneous Q24H  . fluticasone  1 spray Each Nare Daily  . furosemide  60 mg Intravenous Q8H  . ipratropium-albuterol  3 mL Nebulization Q6H  . levothyroxine  225 mcg Oral QAC breakfast  . lisdexamfetamine  60 mg Oral Daily  . lithium carbonate  750 mg Oral QHS  . mouth rinse  15 mL Mouth Rinse q12n4p  . montelukast  10 mg Oral Daily  . multivitamin with minerals  1 tablet Oral Daily  . nystatin   Topical BID  . polyvinyl alcohol  2 drop Both Eyes QHS  . pravastatin  20 mg Oral QHS  . thiamine  100 mg Oral Daily  . vitamin B-12  100 mcg Oral BID  . Vitamin D (Ergocalciferol)  50,000 Units Oral Q7 days    Continuous Infusions:   LOS: 2 days     Kayleen Memos, MD [facility] Pager [phone]  If 7PM-7AM, please contact  night-coverage [email or web address] Password [facility] 12/08/2018, 12:37 PM

## 2018-12-08 NOTE — Evaluation (Signed)
Physical Therapy Evaluation Patient Details Name: Jessica [name] MRN: [id number] DOB: October 07, 1950 Today's Date: 12/08/2018   History of Present Illness  This 68 year old female was admitted for respiratory failure. PMH:  bipolar, spinal steonsis, lupus, chronic CHF and LE cellulitis  Clinical Impression  Pt was admitted for the above and with functional mobility limited by generalized weakness, obesity and balance deficits.  She lives with husband and states that he helped her with LB adls.  Pt reports that she was mod I with mobility with a 4WW.  She had a lot of word finding difficulties and her thoughts did not always make sense.  Cues for safety and attention. Will follow in acute setting with supervision level goals    Follow Up Recommendations Home health PT;Supervision/Assistance - 24 hour    Equipment Recommendations  None recommended by PT    Recommendations for Other Services       Precautions / Restrictions Precautions Precautions: Fall Restrictions Weight Bearing Restrictions: No      Mobility  Bed Mobility Overal bed mobility: Needs Assistance Bed Mobility: Supine to Sit     Supine to sit: Min assist     General bed mobility comments: to come forward, HOB raised  Transfers Overall transfer level: Needs assistance Equipment used: Rolling walker (2 wheeled) Transfers: Sit to/from Stand Sit to Stand: Min assist         General transfer comment: Cues for hand placement to and from seated surface.  Pt tolerated standing well.  Ambulation/Gait Ambulation/Gait assistance: Min assist Gait Distance (Feet): 8 Feet Assistive device: Rolling walker (2 wheeled) Gait Pattern/deviations: Step-through pattern;Decreased stride length;Trunk flexed     General Gait Details: Pt with increased WOB and fatigues quickly.  Cues for position from RW and safety awareness.  Distance ltd 2* elevated BP  Stairs            Wheelchair Mobility    Modified Rankin  (Stroke Patients Only)       Balance Overall balance assessment: Needs assistance Sitting-balance support: Bilateral upper extremity supported;Feet supported Sitting balance-Leahy Scale: Fair     Standing balance support: Bilateral upper extremity supported Standing balance-Leahy Scale: Poor                               Pertinent Vitals/Pain Pain Assessment: No/denies pain    Home Living Family/patient expects to be discharged to:: Private residence Living Arrangements: Spouse/significant other Available Help at Discharge: Family;Available 24 hours/day Type of Home: House Home Access: Ramped entrance     Home Layout: One level Home Equipment: Walker - 4 wheels Additional Comments: sponge bathes with stool in front of sink    Prior Function Level of Independence: Independent with assistive device(s)         Comments: reports husband assisted  with adls as needed     Hand Dominance        Extremity/Trunk Assessment   Upper Extremity Assessment Upper Extremity Assessment: Generalized weakness    Lower Extremity Assessment Lower Extremity Assessment: Generalized weakness    Cervical / Trunk Assessment Cervical / Trunk Assessment: Kyphotic  Communication   Communication: Expressive difficulties  Cognition Arousal/Alertness: Awake/alert Behavior During Therapy: WFL for tasks assessed/performed Overall Cognitive Status: No family/caregiver present to determine baseline cognitive functioning  General Comments:  cues for attention to task:  pt with a lot of questions, poor historian/difficulty expressing self      General Comments General comments (skin integrity, edema, etc.): limited session 2* elevated BP - see vitals    Exercises     Assessment/Plan    PT Assessment Patient needs continued PT services  PT Problem List Decreased strength;Decreased activity tolerance;Decreased  balance;Decreased mobility;Obesity;Decreased knowledge of use of DME;Decreased cognition       PT Treatment Interventions DME instruction;Gait training;Functional mobility training;Therapeutic activities;Therapeutic exercise;Balance training;Patient/family education    PT Goals (Current goals can be found in the Care Plan section)  Acute Rehab PT Goals Patient Stated Goal: OOB PT Goal Formulation: With patient Time For Goal Achievement: 12/14/18 Potential to Achieve Goals: Good    Frequency Min 3X/week   Barriers to discharge        Co-evaluation PT/OT/SLP Co-Evaluation/Treatment: Yes Reason for Co-Treatment: For patient/therapist safety PT goals addressed during session: Mobility/safety with mobility OT goals addressed during session: ADL's and self-care       AM-PAC PT "6 Clicks" Mobility  Outcome Measure Help needed turning from your back to your side while in a flat bed without using bedrails?: A Little Help needed moving from lying on your back to sitting on the side of a flat bed without using bedrails?: A Little Help needed moving to and from a bed to a chair (including a wheelchair)?: A Little Help needed standing up from a chair using your arms (e.g., wheelchair or bedside chair)?: A Little Help needed to walk in hospital room?: A Little Help needed climbing 3-5 steps with a railing? : A Lot 6 Click Score: 17    End of Session Equipment Utilized During Treatment: Gait belt;Oxygen Activity Tolerance: Patient limited by fatigue Patient left: in chair;with call bell/phone within reach;with chair alarm set Nurse Communication: Mobility status PT Visit Diagnosis: Other abnormalities of gait and mobility (R26.89);Difficulty in walking, not elsewhere classified (R26.2)    Time: 1358-1430 PT Time Calculation (min) (ACUTE ONLY): 32 min   Charges:   PT Evaluation $PT Eval Moderate Complexity: [facility] PT Acute Rehabilitation  Services Pager [phone] Office [phone]   Aliany Fiorenza 12/08/2018, 4:43 PM

## 2018-12-08 NOTE — Evaluation (Signed)
Occupational Therapy Evaluation Patient Details Name: Jessica [name] MRN: [id number] DOB: 14-Sep-1950 Today's Date: 12/08/2018    History of Present Illness This 68 year old female was admitted for respiratory failure. PMH:  bipolar, spinal steonsis, lupus, chronic CHF and LE cellulitis   Clinical Impression   Pt was admitted for the above. She lives with husband and states that he helped her with LB adls.  Pt reports that she was mod I with mobility with a 4WW.  She had a lot of word finding difficulties and her thoughts did not always make sense.  Cues for safety and attention. Will follow in acute setting with supervision level goals.    Follow Up Recommendations  Supervision/Assistance - 24 hour;Home health OT    Equipment Recommendations  (tba further, ? 3:1 commode)    Recommendations for Other Services       Precautions / Restrictions Precautions Precautions: Fall Restrictions Weight Bearing Restrictions: No      Mobility Bed Mobility         Supine to sit: Min assist     General bed mobility comments: to come forward, HOB raised  Transfers   Equipment used: Rolling walker (2 wheeled)   Sit to Stand: Min assist              Balance                                           ADL either performed or assessed with clinical judgement   ADL Overall ADL's : Needs assistance/impaired Eating/Feeding: Independent   Grooming: Set up   Upper Body Bathing: Minimal assistance   Lower Body Bathing: Maximal assistance   Upper Body Dressing : Minimal assistance   Lower Body Dressing: Maximal assistance   Toilet Transfer: Minimal assistance;BSC;RW Toilet Transfer Details (indicate cue type and reason): stabilized walker; pt is used to 4 [employer] Toileting- Water quality scientist and Hygiene: Moderate assistance   Tub/ Shower Transfer: Minimal assistance;+2 for safety/equipment;Stand-pivot(to chair)           Vision         Perception      Praxis      Pertinent Vitals/Pain Pain Assessment: No/denies pain     Hand Dominance     Extremity/Trunk Assessment Upper Extremity Assessment Upper Extremity Assessment: Generalized weakness(tremors)           Communication Communication Communication: Expressive difficulties(wrong words, did not always make sense)   Cognition Arousal/Alertness: Awake/alert Behavior During Therapy: WFL for tasks assessed/performed Overall Cognitive Status: No family/caregiver present to determine baseline cognitive functioning                                 General Comments:  cues for attention to task:  pt with a lot of questions, poor historian/difficulty expressing self   General Comments  limited session due to BP:  see vitals    Exercises     Shoulder Instructions      Home Living Family/patient expects to be discharged to:: Private residence Living Arrangements: Spouse/significant other Available Help at Discharge: Family;Available 24 hours/day Type of Home: House Home Access: Ramped entrance     Home Layout: One level         Bathroom Toilet: Standard     Home Equipment: Walker - 4 wheels  Additional Comments: sponge bathes with stool in front of sink      Prior Functioning/Environment          Comments: reports husband assisted  with adls as needed        OT Problem List: Decreased strength;Decreased activity tolerance;Impaired balance (sitting and/or standing);Decreased cognition;Decreased safety awareness;Cardiopulmonary status limiting activity      OT Treatment/Interventions: Self-care/ADL training;DME and/or AE instruction;Therapeutic activities;Balance training;Patient/family education;Energy conservation    OT Goals(Current goals can be found in the care plan section) Acute Rehab OT Goals Patient Stated Goal: OOB OT Goal Formulation: With patient Time For Goal Achievement: 12/22/18 Potential to Achieve Goals: Good ADL  Goals Pt Will Transfer to Toilet: with supervision;ambulating;regular height toilet;bedside commode(vs) Pt Will Perform Toileting - Clothing Manipulation and hygiene: with supervision;sit to/from stand Additional ADL Goal #1: pt will perform adl with set up/supervision using AE as needed Additional ADL Goal #2: pt will not need any cues for safety or sustained attention to task in a quiet environment  OT Frequency: Min 2X/week   Barriers to D/C:            Co-evaluation PT/OT/SLP Co-Evaluation/Treatment: Yes Reason for Co-Treatment: For patient/therapist safety PT goals addressed during session: Mobility/safety with mobility OT goals addressed during session: ADL's and self-care      AM-PAC OT "6 Clicks" Daily Activity     Outcome Measure Help from another person eating meals?: None Help from another person taking care of personal grooming?: A Little Help from another person toileting, which includes using toliet, bedpan, or urinal?: A Lot Help from another person bathing (including washing, rinsing, drying)?: A Lot Help from another person to put on and taking off regular upper body clothing?: A Little Help from another person to put on and taking off regular lower body clothing?: A Lot 6 Click Score: 16   End of Session Nurse Communication: Mobility status  Activity Tolerance: (limited by high BP) Patient left: in chair;with call bell/phone within reach;with chair alarm set  OT Visit Diagnosis: Unsteadiness on feet (R26.81);History of falling (Z91.81)                Time: 2355-7322 OT Time Calculation (min): 30 min Charges:  OT General Charges $OT Visit: 1 Visit OT Evaluation $OT Eval Moderate Complexity: Ama, OTR/L Acute Rehabilitation Services [phone] [facility] pager [phone] office 12/08/2018  [place] 12/08/2018, 3:47 PM

## 2018-12-09 ENCOUNTER — Inpatient Hospital Stay (HOSPITAL_COMMUNITY): Payer: Medicare Other

## 2018-12-09 LAB — CBC WITH DIFFERENTIAL/PLATELET
Abs Immature Granulocytes: 0.08 10*3/uL — ABNORMAL HIGH (ref 0.00–0.07)
Basophils Absolute: 0.1 10*3/uL (ref 0.0–0.1)
Basophils Relative: 1 %
Eosinophils Absolute: 0.1 10*3/uL (ref 0.0–0.5)
Eosinophils Relative: 1 %
HCT: 47.9 % — ABNORMAL HIGH (ref 36.0–46.0)
Hemoglobin: 13.8 g/dL (ref 12.0–15.0)
Immature Granulocytes: 1 %
Lymphocytes Relative: 13 %
Lymphs Abs: 1.9 10*3/uL (ref 0.7–4.0)
MCH: 29.9 pg (ref 26.0–34.0)
MCHC: 28.8 g/dL — ABNORMAL LOW (ref 30.0–36.0)
MCV: 103.7 fL — ABNORMAL HIGH (ref 80.0–100.0)
Monocytes Absolute: 1.9 10*3/uL — ABNORMAL HIGH (ref 0.1–1.0)
Monocytes Relative: 13 %
Neutro Abs: 10 10*3/uL — ABNORMAL HIGH (ref 1.7–7.7)
Neutrophils Relative %: 71 %
Platelets: 386 10*3/uL (ref 150–400)
RBC: 4.62 MIL/uL (ref 3.87–5.11)
RDW: 13.7 % (ref 11.5–15.5)
WBC: 14 10*3/uL — ABNORMAL HIGH (ref 4.0–10.5)
nRBC: 0 % (ref 0.0–0.2)

## 2018-12-09 LAB — BLOOD GAS, ARTERIAL
Acid-Base Excess: 11.1 mmol/L — ABNORMAL HIGH (ref 0.0–2.0)
Bicarbonate: 35.7 mmol/L — ABNORMAL HIGH (ref 20.0–28.0)
Drawn by: 270211
O2 Content: 3 L/min
O2 Saturation: 94 %
Patient temperature: 98.6
pCO2 arterial: 45.7 mmHg (ref 32.0–48.0)
pH, Arterial: 7.505 — ABNORMAL HIGH (ref 7.350–7.450)
pO2, Arterial: 65.5 mmHg — ABNORMAL LOW (ref 83.0–108.0)

## 2018-12-09 LAB — COMPREHENSIVE METABOLIC PANEL
ALT: 21 U/L (ref 0–44)
AST: 29 U/L (ref 15–41)
Albumin: 3.8 g/dL (ref 3.5–5.0)
Alkaline Phosphatase: 105 U/L (ref 38–126)
Anion gap: 13 (ref 5–15)
BUN: 30 mg/dL — ABNORMAL HIGH (ref 8–23)
CO2: 33 mmol/L — ABNORMAL HIGH (ref 22–32)
Calcium: 11.5 mg/dL — ABNORMAL HIGH (ref 8.9–10.3)
Chloride: 97 mmol/L — ABNORMAL LOW (ref 98–111)
Creatinine, Ser: 1.15 mg/dL — ABNORMAL HIGH (ref 0.44–1.00)
GFR calc Af Amer: 56 mL/min — ABNORMAL LOW (ref >60)
GFR calc non Af Amer: 49 mL/min — ABNORMAL LOW (ref >60)
Glucose, Bld: 118 mg/dL — ABNORMAL HIGH (ref 70–99)
Potassium: 3.4 mmol/L — ABNORMAL LOW (ref 3.5–5.1)
Sodium: 143 mmol/L (ref 135–145)
Total Bilirubin: 0.5 mg/dL (ref 0.3–1.2)
Total Protein: 6.9 g/dL (ref 6.5–8.1)

## 2018-12-09 LAB — PROCALCITONIN: Procalcitonin: <0.1 ng/mL

## 2018-12-09 MED ORDER — FUROSEMIDE 40 MG PO TABS
40.0000 mg | ORAL_TABLET | Freq: Every day | ORAL | Status: DC
  Administered 2018-12-09 – 2018-12-11 (×3): 40 mg via ORAL
  Filled 2018-12-09 (×3): qty 1

## 2018-12-09 MED ORDER — METOPROLOL TARTRATE 12.5 MG HALF TABLET
12.5000 mg | ORAL_TABLET | Freq: Two times a day (BID) | ORAL | Status: DC
  Administered 2018-12-09 – 2018-12-11 (×5): 12.5 mg via ORAL
  Filled 2018-12-09 (×5): qty 1

## 2018-12-09 MED ORDER — POTASSIUM CHLORIDE CRYS ER 20 MEQ PO TBCR
20.0000 meq | EXTENDED_RELEASE_TABLET | Freq: Every day | ORAL | Status: DC
  Administered 2018-12-09 – 2018-12-11 (×3): 20 meq via ORAL
  Filled 2018-12-09 (×2): qty 1

## 2018-12-09 MED ORDER — POTASSIUM CHLORIDE CRYS ER 20 MEQ PO TBCR
40.0000 meq | EXTENDED_RELEASE_TABLET | Freq: Once | ORAL | Status: DC
  Filled 2018-12-09: qty 2

## 2018-12-09 NOTE — Progress Notes (Signed)
Physical Therapy Treatment Patient Details Name: RAYLEN KEN MRN: [id number] DOB: May 30, 1950 Today's Date: 12/09/2018    History of Present Illness This 68 year old female was admitted for respiratory failure. PMH:  bipolar, spinal steonsis, lupus, chronic CHF and LE cellulitis    PT Comments    Marked improvement in activity tolerance and pt ability to focus on task.    Follow Up Recommendations  Home health PT;Supervision/Assistance - 24 hour     Equipment Recommendations  None recommended by PT    Recommendations for Other Services       Precautions / Restrictions Precautions Precautions: Fall Restrictions Weight Bearing Restrictions: No    Mobility  Bed Mobility Overal bed mobility: Needs Assistance Bed Mobility: Supine to Sit Rolling: Min assist         General bed mobility comments: min assist to bring trunk to upright position  Transfers Overall transfer level: Needs assistance Equipment used: Rolling walker (2 wheeled) Transfers: Sit to/from Stand Sit to Stand: Min assist         General transfer comment: Cues for hand placement to and from seated surface.  Pt tolerated standing well.  Ambulation/Gait Ambulation/Gait assistance: Min assist Gait Distance (Feet): 120 Feet Assistive device: Rolling walker (2 wheeled) Gait Pattern/deviations: Step-through pattern;Decreased stride length;Trunk flexed Gait velocity: decr   General Gait Details: Pt with increased WOB and fatigues quickly.  Cues for posture, position from RW and safety awareness. Standing rest breaks required for task completion   Stairs             Wheelchair Mobility    Modified Rankin (Stroke Patients Only)       Balance Overall balance assessment: Needs assistance Sitting-balance support: Bilateral upper extremity supported;Feet supported Sitting balance-Leahy Scale: Fair Sitting balance - Comments: Reliant on at least min A to maintain sitting balance given  lethargy.    Standing balance support: Bilateral upper extremity supported Standing balance-Leahy Scale: Poor                              Cognition Arousal/Alertness: Awake/alert Behavior During Therapy: WFL for tasks assessed/performed;Flat affect                                   General Comments: Pt less distractable this date and easier to refocus on task      Exercises      General Comments        Pertinent Vitals/Pain Pain Assessment: No/denies pain    Home Living                      Prior Function            PT Goals (current goals can now be found in the care plan section) Acute Rehab PT Goals Patient Stated Goal: walk PT Goal Formulation: With patient Time For Goal Achievement: 12/14/18 Potential to Achieve Goals: Good Progress towards PT goals: Progressing toward goals    Frequency    Min 3X/week      PT Plan Current plan remains appropriate    Co-evaluation              AM-PAC PT "6 Clicks" Mobility   Outcome Measure  Help needed turning from your back to your side while in a flat bed without using bedrails?: A Little Help needed moving from lying  on your back to sitting on the side of a flat bed without using bedrails?: A Little Help needed moving to and from a bed to a chair (including a wheelchair)?: A Little Help needed standing up from a chair using your arms (e.g., wheelchair or bedside chair)?: A Little Help needed to walk in hospital room?: A Little Help needed climbing 3-5 steps with a railing? : A Lot 6 Click Score: 17    End of Session Equipment Utilized During Treatment: Gait belt;Oxygen Activity Tolerance: Patient tolerated treatment well Patient left: in chair;with call bell/phone within reach;with chair alarm set Nurse Communication: Mobility status PT Visit Diagnosis: Other abnormalities of gait and mobility (R26.89);Difficulty in walking, not elsewhere classified (R26.2) Pain -  Right/Left: Left Pain - part of body: Leg     Time: 0814-4818 PT Time Calculation (min) (ACUTE ONLY): 28 min  Charges:  $Gait Training: 23-37 mins                     [place] Pager [phone] Office [phone]    Ariyanna Oien 12/09/2018, 1:32 PM

## 2018-12-09 NOTE — Progress Notes (Signed)
PROGRESS NOTE  Jessica [name] YKZ:[id number] DOB: 10-20-1950 DOA: 12/06/2018 PCP: Alroy Dust, L.Marlou Sa, MD  HPI/Recap of past 24 hours:  Jessica [name] is a 68 y.o. female   bipolar disorder, depression,  hypertension, hyperlipidemia, hypothyroidism, IBS, spinal stenosis, lupus, obesity OSA on cpap,  chronic hypoxemic respiratory failure on home oxygen (2 L at rest and 4 L with activity/sleep) who is recently discharged from hospital after treating acute on chronic diastolic chf and lower extremity cellulitis, returned to the ED due to lethargy, hypoxia, bilateral lower extremity edema. No reported fever. No reports of chest pain.  ED course: she is very lethargic, no fever, no tachycardia, bp elevated, no hypoxia at rest on 2liter o2 supplement, ABG showed significant co2 retention ( pco2 82.8) with acidosis (PH 7.283) ,she is edematous, cxr "Findings consistent with congestive heart failure". Wbc 16.5,  Cr 1.09. bnp 82, high sensitivity troponin 24, she received lasix 80mg  x1 in the ED and is put on bipap. Per husband patient is DNR/DNI, hospitalist called to admit the patient.   SARS cov2 screening negative in the ED.  Admitted for acute metabolic encephalopathy in the setting of acute on chronic hypoxic hypercarbic respiratory failure requiring noninvasive mechanical ventilation BiPAP.    12/09/18: Patient was seen and examined at her bedside.  She is more alert today and oriented x4.  She wants to go home.  Ongoing diuresing.  Net I&O -8.5 L since admission.  Weight 130 kg from 145 kg on 11/22/2018.  Still hypoxic requiring 3 L nasal cannula.  Elevated PCO2 last ABG.  Compliant with BiPAP in the hospital.  Suspect acute on chronic hypercarbia secondary to obesity hypoventilation syndrome and OSA.  Repeat chest x-ray and ABG.  Patient might qualify for NIV for discharge planning.   Assessment/Plan: Active Problems:   OSA (obstructive sleep apnea)   Morbid obesity (HCC)   Hypercapnic  respiratory failure (HCC)   Resolved acute metabolic encephalopathy likely multifactorial secondary to severe hypercapnia and hypoxia versus polypharmacy Presented with PCO2 greater than 80 on ABG with low pH, placed on BiPAP Repeat ABG done on 12/07/2018 improved with PCO2 in the 60s and normal pH. Repeat ABG today  Acute on chronic hypoxic hypercarbic respiratory failure likely multifactorial secondary to acute on chronic diastolic CHF versus worsening obesity hypoventilation syndrome with OSA On oxygen supplementation at baseline 2 L at rest and 4 L with ambulation Continue BiPAP at night due to hypercarbia Repeat ABG on 12/09/2018 Might qualify for NIV for discharge planning Continue bronchodilators and diuretics  Leukocytosis, unclear etiology Procalcitonin less than 0.10 on 12/09/2018 Afebrile Blood cultures negative to date  Hypercalcemia, unclear etiology Calcium level 11.5, albumin 3.8 >> corrected calcium for albumin 11.7. Monitor, already on diuretics  Pulmonary edema likely cardiogenic secondary to acute on chronic diastolic CHF Personally reviewed chest x-ray done on admission which showed cardiomegaly with increase in pulmonary vascularity indicative of pulmonary edema Last 2D echo done on 12/02/2018 shows preserved LVEF 60 to 65% Continue strict I's and O's and daily weight Completed IV Lasix 60 mg 3 times daily Switch to home dose oral Lasix 40 mg daily for DC planning Closely monitor electrolytes and renal function on IV diuretics Net I&O -8.5 L since admission Weight 130 kg from 145 kg on 11/22/2018  Hypokalemia, diuretics induced Replete as indicated Creatinine 1.15 with GFR of 49 Repeat BMP in the morning  Uncontrolled hypertension Norvasc increased to 10 mg daily Resume home dose p.o. Lasix 40 mg daily Add  metoprolol tartrate 12.5 mg twice daily  Continue to closely monitor vital signs  Bilateral lower extremity edema with hyperpigmentation Suspect chronic  venous stasis No warmth, no tenderness on palpation  Chronic anxiety/depression/bipolar disorder Resume home medications  Hypothyroidism Continue Synthroid  Hyperlipidemia Continue atorvastatin   Recently treated bilateral lower extremity cellulitis Afebrile Leukocytosis persists  Severe morbid obesity with concern for obesity hypoventilation syndrome BMI 54 Recommend weight loss outpatient with regular physical activity and healthy dieting  OSA on CPAP BiPAP at night due to hypercarbia Repeat ABG if PCO2 still elevated consider NIV nightly for DC planning  Physical debility PT OT recommended home health PT OT Continue physical activity Fall precautions  DVT prophylaxis: lovenox subcu daily  Consultants: none  Code Status: DNR  Family Communication:   Will call family and give update.  Disposition: Possible transfer to telemetry 12/09/2018 when more stable.     Objective: Vitals:   12/09/18 0837 12/09/18 0929 12/09/18 1000 12/09/18 1100  BP:  (!) 162/89 (!) 195/144   Pulse:   (!) 107 (!) 104  Resp:   (!) 25 20  Temp: 97.9 F (36.6 C)     TempSrc: Oral     SpO2: 96%  97% 92%  Weight:      Height:        Intake/Output Summary (Last 24 hours) at 12/09/2018 1201 Last data filed at 12/09/2018 0915 Gross per 24 hour  Intake -  Output 2400 ml  Net -2400 ml   Filed Weights   12/07/18 1636 12/08/18 0500 12/09/18 0600  Weight: 134.8 kg 130 kg 126.7 kg    Exam:  . General: 68 y.o. year-old female severe morbid obesity.  Alert and oriented x4.  No acute distress.   . Cardiovascular: Regular rate and rhythm no rubs or gallops no JVD or thyromegaly noted.   Marland Kitchen Respiratory: Clear to auscultation no wheezes or rales.  Poor inspiratory effort.  .  Abdomen: Obese nontender nondistended normal bowel sounds present. . Musculoskeletal: Trace lower extremity edema.  Hyperpigmentation likely secondary to venous stasis. Marland Kitchen Psychiatry: Mood is appropriate for condition  and setting.   Data Reviewed: CBC: Recent Labs  Lab 12/03/18 0523 12/06/18 1514 12/07/18 0204 12/09/18 0543  WBC 15.7* 16.5* 14.2* 14.0*  NEUTROABS 12.7* 12.7*  --  10.0*  HGB 13.9 13.3 12.9 13.8  HCT 46.8* 46.9* 45.2 47.9*  MCV 104.2* 108.3* 107.4* 103.7*  PLT 337 348 340 176   Basic Metabolic Panel: Recent Labs  Lab 12/06/18 1514 12/07/18 0204 12/08/18 0203 12/08/18 1323 12/09/18 0543  NA 138 140 147* 146* 143  K 4.3 4.2 3.8 3.5 3.4*  CL 93* 95* 100 100 97*  CO2 36* 35* 36* 34* 33*  GLUCOSE 118* 107* 125* 143* 118*  BUN 36* 32* 32* 32* 30*  CREATININE 1.09* 0.99 1.00 1.08* 1.15*  CALCIUM 11.1* 10.9* 11.5* 11.8* 11.5*  MG  --  2.2  --   --   --    GFR: Estimated Creatinine Clearance: 58.8 mL/min (A) (by C-G formula based on SCr of 1.15 mg/dL (H)). Liver Function Tests: Recent Labs  Lab 12/07/18 0204 12/08/18 1323 12/09/18 0543  AST 22 27 29   ALT 19 22 21   ALKPHOS 101 103 105  BILITOT 0.4 0.5 0.5  PROT 6.4* 7.1 6.9  ALBUMIN 3.4* 3.8 3.8   No results for input(s): LIPASE, AMYLASE in the last 168 hours. No results for input(s): AMMONIA in the last 168 hours. Coagulation Profile: No results for input(s):  INR, PROTIME in the last 168 hours. Cardiac Enzymes: No results for input(s): CKTOTAL, CKMB, CKMBINDEX, TROPONINI in the last 168 hours. BNP (last 3 results) No results for input(s): PROBNP in the last 8760 hours. HbA1C: No results for input(s): HGBA1C in the last 72 hours. CBG: Recent Labs  Lab 12/07/18 1844  GLUCAP 110*   Lipid Profile: No results for input(s): CHOL, HDL, LDLCALC, TRIG, CHOLHDL, LDLDIRECT in the last 72 hours. Thyroid Function Tests: No results for input(s): TSH, T4TOTAL, FREET4, T3FREE, THYROIDAB in the last 72 hours. Anemia Panel: No results for input(s): VITAMINB12, FOLATE, FERRITIN, TIBC, IRON, RETICCTPCT in the last 72 hours. Urine analysis:    Component Value Date/Time   COLORURINE STRAW (A) 12/06/2018 2051    APPEARANCEUR CLEAR 12/06/2018 2051   LABSPEC 1.006 12/06/2018 2051   PHURINE 6.0 12/06/2018 2051   GLUCOSEU NEGATIVE 12/06/2018 2051   HGBUR NEGATIVE 12/06/2018 2051   [place] NEGATIVE 12/06/2018 2051   [place] NEGATIVE 12/06/2018 2051   PROTEINUR NEGATIVE 12/06/2018 2051   NITRITE NEGATIVE 12/06/2018 2051   LEUKOCYTESUR NEGATIVE 12/06/2018 2051   Sepsis Labs: @LABRCNTIP (procalcitonin:4,lacticidven:4)  ) Recent Results (from the past 240 hour(s))  SARS Coronavirus 2 (CEPHEID - Performed in [facility] hospital lab), Hosp Order     Status: None   Collection Time: 11/29/18 10:21 PM   Specimen: Nasopharyngeal Swab  Result Value Ref Range Status   SARS Coronavirus 2 NEGATIVE NEGATIVE Final    Comment: (NOTE) If result is NEGATIVE SARS-CoV-2 target nucleic acids are NOT DETECTED. The SARS-CoV-2 RNA is generally detectable in upper and lower  respiratory specimens during the acute phase of infection. The lowest  concentration of SARS-CoV-2 viral copies this assay can detect is 250  copies / mL. A negative result does not preclude SARS-CoV-2 infection  and should not be used as the sole basis for treatment or other  patient management decisions.  A negative result may occur with  improper specimen collection / handling, submission of specimen other  than nasopharyngeal swab, presence of viral mutation(s) within the  areas targeted by this assay, and inadequate number of viral copies  (<250 copies / mL). A negative result must be combined with clinical  observations, patient history, and epidemiological information. If result is POSITIVE SARS-CoV-2 target nucleic acids are DETECTED. The SARS-CoV-2 RNA is generally detectable in upper and lower  respiratory specimens dur ing the acute phase of infection.  Positive  results are indicative of active infection with SARS-CoV-2.  Clinical  correlation with patient history and other diagnostic information is  necessary to determine  patient infection status.  Positive results do  not rule out bacterial infection or co-infection with other viruses. If result is PRESUMPTIVE POSTIVE SARS-CoV-2 nucleic acids MAY BE PRESENT.   A presumptive positive result was obtained on the submitted specimen  and confirmed on repeat testing.  While 2019 novel coronavirus  (SARS-CoV-2) nucleic acids may be present in the submitted sample  additional confirmatory testing may be necessary for epidemiological  and / or clinical management purposes  to differentiate between  SARS-CoV-2 and other Sarbecovirus currently known to infect humans.  If clinically indicated additional testing with an alternate test  methodology [phone]) is advised. The SARS-CoV-2 RNA is generally  detectable in upper and lower respiratory sp ecimens during the acute  phase of infection. The expected result is Negative. Fact Sheet for Patients:  StrictlyIdeas.no Fact Sheet for Healthcare Providers: BankingDealers.co.za This test is not yet approved or cleared by the [place]  FDA and has been authorized for detection and/or diagnosis of SARS-CoV-2 by FDA under an Emergency Use Authorization (EUA).  This EUA will remain in effect (meaning this test can be used) for the duration of the COVID-19 declaration under Section 564(b)(1) of the Act, 21 U.S.C. section 360bbb-3(b)(1), unless the authorization is terminated or revoked sooner. Performed at [facility], [place] [address] [place], [place] [address]   Culture, blood (routine x 2)     Status: None   Collection Time: 11/30/18  2:43 PM   Specimen: BLOOD  Result Value Ref Range Status   Specimen Description BLOOD LEFT ANTECUBITAL  Final   Special Requests   Final    BOTTLES DRAWN AEROBIC AND ANAEROBIC Blood Culture adequate volume   Culture   Final    NO GROWTH 5 DAYS Performed at [facility], [place] [address], [place], [place] [address]    Report  Status 12/05/2018 FINAL  Final  Culture, blood (routine x 2)     Status: None   Collection Time: 11/30/18  2:52 PM   Specimen: BLOOD LEFT HAND  Result Value Ref Range Status   Specimen Description BLOOD LEFT HAND  Final   Special Requests   Final    BOTTLES DRAWN AEROBIC AND ANAEROBIC Blood Culture results may not be optimal due to an inadequate volume of blood received in culture bottles   Culture   Final    NO GROWTH 5 DAYS Performed at [facility], [place] [address] [place], [place] [address]    Report Status 12/05/2018 FINAL  Final  SARS Coronavirus 2 [facility] order, Performed in [facility] hospital lab) Nasopharyngeal Nasopharyngeal Swab     Status: None   Collection Time: 12/06/18  4:46 PM   Specimen: Nasopharyngeal Swab  Result Value Ref Range Status   SARS Coronavirus 2 NEGATIVE NEGATIVE Final    Comment: (NOTE) If result is NEGATIVE SARS-CoV-2 target nucleic acids are NOT DETECTED. The SARS-CoV-2 RNA is generally detectable in upper and lower  respiratory specimens during the acute phase of infection. The lowest  concentration of SARS-CoV-2 viral copies this assay can detect is 250  copies / mL. A negative result does not preclude SARS-CoV-2 infection  and should not be used as the sole basis for treatment or other  patient management decisions.  A negative result may occur with  improper specimen collection / handling, submission of specimen other  than nasopharyngeal swab, presence of viral mutation(s) within the  areas targeted by this assay, and inadequate number of viral copies  (<250 copies / mL). A negative result must be combined with clinical  observations, patient history, and epidemiological information. If result is POSITIVE SARS-CoV-2 target nucleic acids are DETECTED. The SARS-CoV-2 RNA is generally detectable in upper and lower  respiratory specimens dur ing the acute phase of infection.  Positive  results are indicative of active infection with  SARS-CoV-2.  Clinical  correlation with patient history and other diagnostic information is  necessary to determine patient infection status.  Positive results do  not rule out bacterial infection or co-infection with other viruses. If result is PRESUMPTIVE POSTIVE SARS-CoV-2 nucleic acids MAY BE PRESENT.   A presumptive positive result was obtained on the submitted specimen  and confirmed on repeat testing.  While 2019 novel coronavirus  (SARS-CoV-2) nucleic acids may be present in the submitted sample  additional confirmatory testing may be necessary for epidemiological  and / or clinical management purposes  to differentiate  between  SARS-CoV-2 and other Sarbecovirus currently known to infect humans.  If clinically indicated additional testing with an alternate test  methodology [phone]) is advised. The SARS-CoV-2 RNA is generally  detectable in upper and lower respiratory sp ecimens during the acute  phase of infection. The expected result is Negative. Fact Sheet for Patients:  StrictlyIdeas.no Fact Sheet for Healthcare Providers: BankingDealers.co.za This test is not yet approved or cleared by the [place] FDA and has been authorized for detection and/or diagnosis of SARS-CoV-2 by FDA under an Emergency Use Authorization (EUA).  This EUA will remain in effect (meaning this test can be used) for the duration of the COVID-19 declaration under Section 564(b)(1) of the Act, 21 U.S.C. section 360bbb-3(b)(1), unless the authorization is terminated or revoked sooner. Performed at [facility], [place] [address] [place], [place] [address]   Culture, blood (routine x 2)     Status: None (Preliminary result)   Collection Time: 12/06/18  9:08 PM   Specimen: BLOOD  Result Value Ref Range Status   Specimen Description   Final    BLOOD RIGHT ANTECUBITAL Performed at [place] [address],  [place], [place] [address]    Special Requests   Final    BOTTLES DRAWN AEROBIC AND ANAEROBIC Blood Culture adequate volume Performed at [place] [address] [place], [place] [address]    Culture   Final    NO GROWTH 2 DAYS Performed at [place] [address], [place], [place] [address]    Report Status PENDING  Incomplete  Culture, blood (routine x 2)     Status: None (Preliminary result)   Collection Time: 12/06/18  9:16 PM   Specimen: BLOOD RIGHT ARM  Result Value Ref Range Status   Specimen Description BLOOD RIGHT ARM  Final   Special Requests   Final    BOTTLES DRAWN AEROBIC ONLY Blood Culture adequate volume   Culture   Final    NO GROWTH 2 DAYS Performed at [facility], [place] [address], [place],  [address]    Report Status PENDING  Incomplete  MRSA PCR Screening     Status: None   Collection Time: 12/07/18  1:24 PM   Specimen: Nasal Mucosa; Nasopharyngeal  Result Value Ref Range Status   MRSA by PCR NEGATIVE NEGATIVE Final    Comment:        The GeneXpert MRSA Assay (FDA approved for NASAL specimens only), is one component of a comprehensive MRSA colonization surveillance program. It is not intended to diagnose MRSA infection nor to guide or monitor treatment for MRSA infections. Performed at [facility], [place] [address], [place],  [address]       Studies: No results found.  Scheduled Meds: . amLODipine  10 mg Oral Daily  . chlorhexidine  15 mL Mouth Rinse BID  . Chlorhexidine Gluconate Cloth  6 each Topical Daily  . cycloSPORINE  1 drop Both Eyes BID  . DULoxetine  60 mg Oral Daily  . enoxaparin (LOVENOX) injection  40 mg Subcutaneous Q24H  . fluticasone  1 spray Each Nare Daily  . furosemide  60 mg Intravenous Q8H  . ipratropium-albuterol  3 mL Nebulization Q6H  . levothyroxine  225 mcg Oral QAC breakfast  . lisdexamfetamine  60 mg Oral Daily  . lithium carbonate  750 mg Oral  QHS  . mouth rinse  15 mL Mouth Rinse q12n4p  . metoprolol tartrate  12.5 mg Oral BID  . montelukast  10 mg Oral Daily  . multivitamin with minerals  1 tablet Oral Daily  . nystatin   Topical BID  . polyvinyl alcohol  2 drop Both Eyes QHS  . potassium chloride  40 mEq Oral Once  . pravastatin  20 mg Oral QHS  . thiamine  100 mg Oral Daily  . vitamin B-12  100 mcg Oral BID  . Vitamin D (Ergocalciferol)  50,000 Units Oral Q7 days    Continuous Infusions:   LOS: 3 days     Kayleen Memos, MD [facility] Pager [phone]  If 7PM-7AM, please contact night-coverage [email or web address] Password [email or web address] 12/09/2018, 12:01 PM

## 2018-12-10 LAB — BASIC METABOLIC PANEL
Anion gap: 11 (ref 5–15)
BUN: 30 mg/dL — ABNORMAL HIGH (ref 8–23)
CO2: 33 mmol/L — ABNORMAL HIGH (ref 22–32)
Calcium: 11.1 mg/dL — ABNORMAL HIGH (ref 8.9–10.3)
Chloride: 94 mmol/L — ABNORMAL LOW (ref 98–111)
Creatinine, Ser: 1.17 mg/dL — ABNORMAL HIGH (ref 0.44–1.00)
GFR calc Af Amer: 55 mL/min — ABNORMAL LOW (ref >60)
GFR calc non Af Amer: 48 mL/min — ABNORMAL LOW (ref >60)
Glucose, Bld: 113 mg/dL — ABNORMAL HIGH (ref 70–99)
Potassium: 3.8 mmol/L (ref 3.5–5.1)
Sodium: 138 mmol/L (ref 135–145)

## 2018-12-10 LAB — PHOSPHORUS: Phosphorus: 3.6 mg/dL (ref 2.5–4.6)

## 2018-12-10 LAB — MAGNESIUM: Magnesium: 2.5 mg/dL — ABNORMAL HIGH (ref 1.7–2.4)

## 2018-12-10 MED ORDER — IPRATROPIUM-ALBUTEROL 0.5-2.5 (3) MG/3ML IN SOLN
3.0000 mL | Freq: Three times a day (TID) | RESPIRATORY_TRACT | Status: DC
  Filled 2018-12-10: qty 3

## 2018-12-10 NOTE — Progress Notes (Signed)
PROGRESS NOTE  Jessica [name] QQI:[id number] DOB: [birth year] DOA: 12/06/2018 PCP: Alroy Dust, L.Marlou Sa, MD  HPI/Recap of past 24 hours:  Jessica [name] is a 68 y.o. female   bipolar disorder, depression,  hypertension, hyperlipidemia, hypothyroidism, IBS, spinal stenosis, lupus, obesity OSA on cpap,  chronic hypoxemic respiratory failure on home oxygen (2 L at rest and 4 L with activity/sleep) who is recently discharged from hospital after treating acute on chronic diastolic chf and lower extremity cellulitis, returned to the ED due to lethargy, hypoxia, bilateral lower extremity edema. No reported fever. No reports of chest pain.  ED course: she is very lethargic, no fever, no tachycardia, bp elevated, no hypoxia at rest on 2liter o2 supplement, ABG showed significant co2 retention ( pco2 82.8) with acidosis (PH 7.283) ,she is edematous, cxr "Findings consistent with congestive heart failure". Wbc 16.5,  Cr 1.09. bnp 82, high sensitivity troponin 24, she received lasix 80mg  x1 in the ED and is put on bipap. Per husband patient is DNR/DNI, hospitalist called to admit the patient.   SARS cov2 screening negative in the ED.  Admitted for acute metabolic encephalopathy in the setting of acute on chronic hypoxic hypercarbic respiratory failure requiring noninvasive mechanical ventilation BiPAP.    12/09/18: Patient was seen and examined at her bedside.  She is more alert today and oriented x4.  She wants to go home.  Ongoing diuresing.  Net I&O -8.5 L since admission.  Weight 130 kg from 145 kg on 11/22/2018.  Still hypoxic requiring 3 L nasal cannula.  Elevated PCO2 last ABG.  Compliant with BiPAP in the hospital.  Suspect acute on chronic hypercarbia secondary to obesity hypoventilation syndrome and OSA.  Repeat chest x-ray and ABG.  Patient might qualify for NIV for discharge planning.  12/10/18: Patient was seen and examined at her bedside this morning.  States she feels much better.  ABG done on  12/09/2018 showed PCO2 45, not qualifying for NIV.  Patient will use CPAP from home in stepdown unit tonight if tolerates well will transfer to telemetry to continue her care.   Assessment/Plan: Active Problems:   OSA (obstructive sleep apnea)   Morbid obesity (HCC)   Hypercapnic respiratory failure (HCC)   Resolved acute metabolic encephalopathy likely multifactorial secondary to severe hypercapnia and hypoxia versus polypharmacy Presented with PCO2 greater than 80 on ABG with low pH, placed on BiPAP Repeat ABG done on 12/07/2018 improved with PCO2 in the 60s and normal pH. Repeat ABG done on 12/09/2018 PCO2 45, PO2 65.5 and pH 7.505  Acute on chronic hypoxic hypercarbic respiratory failure likely multifactorial secondary to acute on chronic diastolic CHF versus worsening obesity hypoventilation syndrome with OSA On oxygen supplementation at baseline 2 L at rest and 4 L with ambulation Continue BiPAP at night due to hypercarbia Repeat ABG on 12/09/2018 PCO2 45 Continue bronchodilators and diuretics Maintain O2 saturation greater than 90% We will need home O2 evaluation prior to discharge Home O2 evaluation ordered, pending assessment.  OSA on CPAP Start CPAP nightly If she does okay on CPAP can transfer out of stepdown unit to telemetry unit possibly tomorrow 12/11/2018  Leukocytosis, unclear etiology Procalcitonin less than 0.10 on 12/09/2018 Afebrile Blood cultures negative to date  Improving hypercalcemia, unclear etiology Calcium level 11.5, albumin 3.8 >> corrected calcium for albumin 11.7. Calcium level 11.1 on 12/10/2018 while on diuretics Monitor, already on diuretics  Pulmonary edema likely cardiogenic secondary to acute on chronic diastolic CHF Personally reviewed chest x-ray done on admission  which showed cardiomegaly with increase in pulmonary vascularity indicative of pulmonary edema Last 2D echo done on 12/02/2018 shows preserved LVEF 60 to 65% Continue strict I's and O's  and daily weight Completed IV Lasix 60 mg 3 times daily Switch to home dose oral Lasix 40 mg daily for DC planning Closely monitor electrolytes and renal function on IV diuretics Net I&O -8.5 L since admission Weight 130 kg from 145 kg on 11/22/2018  Resolved hypokalemia post repletion, diuretics induced Replete as indicated Creatinine 1.15 with GFR of 49  Uncontrolled hypertension Norvasc increased to 10 mg daily Resume home dose p.o. Lasix 40 mg daily Continue metoprolol tartrate 12.5 mg twice daily, added on 12/09/2018 Blood pressure is at goal  Bilateral lower extremity edema with hyperpigmentation Suspect chronic venous stasis No warmth, no tenderness on palpation  Chronic anxiety/depression/bipolar disorder Resume home medications  Hypothyroidism Continue Synthroid  Hyperlipidemia Continue atorvastatin   Recently treated bilateral lower extremity cellulitis Afebrile Leukocytosis persists  Severe morbid obesity with concern for obesity hypoventilation syndrome BMI 54 Recommend weight loss outpatient with regular physical activity and healthy dieting  OSA on CPAP BiPAP at night due to hypercarbia Repeat ABG if PCO2 still elevated consider NIV nightly for DC planning  Physical debility PT OT recommended home health PT OT Continue physical activity Fall precautions  DVT prophylaxis: lovenox subcu daily  Consultants: none  Code Status: DNR  Family Communication:   Will call family and give update.  Disposition: Possible transfer to telemetry 12/11/2018 when can tolerate off BiPAP.  Home CPAP at night.     Objective: Vitals:   12/10/18 0825 12/10/18 1106 12/10/18 1124 12/10/18 1200  BP:  (!) 143/94  (!) 144/95  Pulse:  87  82  Resp:  17  (!) 25  Temp: 97.7 F (36.5 C)  98 F (36.7 C)   TempSrc: Oral  Oral   SpO2:  95%  94%  Weight:      Height:        Intake/Output Summary (Last 24 hours) at 12/10/2018 1358 Last data filed at 12/10/2018 0554  Gross per 24 hour  Intake 540 ml  Output 1500 ml  Net -960 ml   Filed Weights   12/08/18 0500 12/09/18 0600 12/10/18 0500  Weight: 130 kg 126.7 kg 125.2 kg    Exam:  . General: 68 y.o. year-old female severe morbid obesity.  Alert and oriented x4.  No acute distress. . Cardiovascular: Regular rate and rhythm no rubs or gallops no JVD or thyromegaly noted. Marland Kitchen Respiratory: Clear to auscultation no wheezes or rales. Poor inspiratory effort.  .  Abdomen: Obese nontender nondistended normal bowel sounds present.. . Musculoskeletal: Trace lower extremity edema.  Hyperpigmentation bilaterally in lower extremities.  Likely venous stasis.   Marland Kitchen Psychiatry: Mood is appropriate for condition and setting.  Data Reviewed: CBC: Recent Labs  Lab 12/06/18 1514 12/07/18 0204 12/09/18 0543  WBC 16.5* 14.2* 14.0*  NEUTROABS 12.7*  --  10.0*  HGB 13.3 12.9 13.8  HCT 46.9* 45.2 47.9*  MCV 108.3* 107.4* 103.7*  PLT 348 340 016   Basic Metabolic Panel: Recent Labs  Lab 12/07/18 0204 12/08/18 0203 12/08/18 1323 12/09/18 0543 12/10/18 0810  NA 140 147* 146* 143 138  K 4.2 3.8 3.5 3.4* 3.8  CL 95* 100 100 97* 94*  CO2 35* 36* 34* 33* 33*  GLUCOSE 107* 125* 143* 118* 113*  BUN 32* 32* 32* 30* 30*  CREATININE 0.99 1.00 1.08* 1.15* 1.17*  CALCIUM 10.9*  11.5* 11.8* 11.5* 11.1*  MG 2.2  --   --   --  2.5*  PHOS  --   --   --   --  3.6   GFR: Estimated Creatinine Clearance: 57.4 mL/min (A) (by C-G formula based on SCr of 1.17 mg/dL (H)). Liver Function Tests: Recent Labs  Lab 12/07/18 0204 12/08/18 1323 12/09/18 0543  AST 22 27 29   ALT 19 22 21   ALKPHOS 101 103 105  BILITOT 0.4 0.5 0.5  PROT 6.4* 7.1 6.9  ALBUMIN 3.4* 3.8 3.8   No results for input(s): LIPASE, AMYLASE in the last 168 hours. No results for input(s): AMMONIA in the last 168 hours. Coagulation Profile: No results for input(s): INR, PROTIME in the last 168 hours. Cardiac Enzymes: No results for input(s): CKTOTAL,  CKMB, CKMBINDEX, TROPONINI in the last 168 hours. BNP (last 3 results) No results for input(s): PROBNP in the last 8760 hours. HbA1C: No results for input(s): HGBA1C in the last 72 hours. CBG: Recent Labs  Lab 12/07/18 1844  GLUCAP 110*   Lipid Profile: No results for input(s): CHOL, HDL, LDLCALC, TRIG, CHOLHDL, LDLDIRECT in the last 72 hours. Thyroid Function Tests: No results for input(s): TSH, T4TOTAL, FREET4, T3FREE, THYROIDAB in the last 72 hours. Anemia Panel: No results for input(s): VITAMINB12, FOLATE, FERRITIN, TIBC, IRON, RETICCTPCT in the last 72 hours. Urine analysis:    Component Value Date/Time   COLORURINE STRAW (A) 12/06/2018 2051   APPEARANCEUR CLEAR 12/06/2018 2051   LABSPEC 1.006 12/06/2018 2051   PHURINE 6.0 12/06/2018 2051   GLUCOSEU NEGATIVE 12/06/2018 2051   HGBUR NEGATIVE 12/06/2018 2051   [place] NEGATIVE 12/06/2018 2051   [place] NEGATIVE 12/06/2018 2051   PROTEINUR NEGATIVE 12/06/2018 2051   NITRITE NEGATIVE 12/06/2018 2051   LEUKOCYTESUR NEGATIVE 12/06/2018 2051   Sepsis Labs: @LABRCNTIP (procalcitonin:4,lacticidven:4)  ) Recent Results (from the past 240 hour(s))  Culture, blood (routine x 2)     Status: None   Collection Time: 11/30/18  2:43 PM   Specimen: BLOOD  Result Value Ref Range Status   Specimen Description BLOOD LEFT ANTECUBITAL  Final   Special Requests   Final    BOTTLES DRAWN AEROBIC AND ANAEROBIC Blood Culture adequate volume   Culture   Final    NO GROWTH 5 DAYS Performed at [facility], [place] [address], [place], [place] [address]    Report Status 12/05/2018 FINAL  Final  Culture, blood (routine x 2)     Status: None   Collection Time: 11/30/18  2:52 PM   Specimen: BLOOD LEFT HAND  Result Value Ref Range Status   Specimen Description BLOOD LEFT HAND  Final   Special Requests   Final    BOTTLES DRAWN AEROBIC AND ANAEROBIC Blood Culture results may not be optimal due to an inadequate volume of blood received  in culture bottles   Culture   Final    NO GROWTH 5 DAYS Performed at [facility], [place] [address] [place], [place] [address]    Report Status 12/05/2018 FINAL  Final  SARS Coronavirus 2 [facility] order, Performed in [facility] hospital lab) Nasopharyngeal Nasopharyngeal Swab     Status: None   Collection Time: 12/06/18  4:46 PM   Specimen: Nasopharyngeal Swab  Result Value Ref Range Status   SARS Coronavirus 2 NEGATIVE NEGATIVE Final    Comment: (NOTE) If result is NEGATIVE SARS-CoV-2 target nucleic acids are NOT DETECTED. The SARS-CoV-2 RNA is generally detectable in upper and lower  respiratory specimens during the acute phase of infection. The lowest  concentration of SARS-CoV-2 viral copies this assay can detect is 250  copies / mL. A negative result does not preclude SARS-CoV-2 infection  and should not be used as the sole basis for treatment or other  patient management decisions.  A negative result may occur with  improper specimen collection / handling, submission of specimen other  than nasopharyngeal swab, presence of viral mutation(s) within the  areas targeted by this assay, and inadequate number of viral copies  (<250 copies / mL). A negative result must be combined with clinical  observations, patient history, and epidemiological information. If result is POSITIVE SARS-CoV-2 target nucleic acids are DETECTED. The SARS-CoV-2 RNA is generally detectable in upper and lower  respiratory specimens dur ing the acute phase of infection.  Positive  results are indicative of active infection with SARS-CoV-2.  Clinical  correlation with patient history and other diagnostic information is  necessary to determine patient infection status.  Positive results do  not rule out bacterial infection or co-infection with other viruses. If result is PRESUMPTIVE POSTIVE SARS-CoV-2 nucleic acids MAY BE PRESENT.   A presumptive positive result was obtained on the submitted specimen   and confirmed on repeat testing.  While 2019 novel coronavirus  (SARS-CoV-2) nucleic acids may be present in the submitted sample  additional confirmatory testing may be necessary for epidemiological  and / or clinical management purposes  to differentiate between  SARS-CoV-2 and other Sarbecovirus currently known to infect humans.  If clinically indicated additional testing with an alternate test  methodology [phone]) is advised. The SARS-CoV-2 RNA is generally  detectable in upper and lower respiratory sp ecimens during the acute  phase of infection. The expected result is Negative. Fact Sheet for Patients:  StrictlyIdeas.no Fact Sheet for Healthcare Providers: BankingDealers.co.za This test is not yet approved or cleared by the [place] FDA and has been authorized for detection and/or diagnosis of SARS-CoV-2 by FDA under an Emergency Use Authorization (EUA).  This EUA will remain in effect (meaning this test can be used) for the duration of the COVID-19 declaration under Section 564(b)(1) of the Act, 21 U.S.C. section 360bbb-3(b)(1), unless the authorization is terminated or revoked sooner. Performed at [facility], [place] [address] [place], [place] [address]   Culture, blood (routine x 2)     Status: None (Preliminary result)   Collection Time: 12/06/18  9:08 PM   Specimen: BLOOD  Result Value Ref Range Status   Specimen Description   Final    BLOOD RIGHT ANTECUBITAL Performed at [place] [address] [place], [place] [address]    Special Requests   Final    BOTTLES DRAWN AEROBIC AND ANAEROBIC Blood Culture adequate volume Performed at [place] [address], [place], [place] [address]    Culture   Final    NO GROWTH 3 DAYS Performed at [facility], [place] [address] [place], [place] [address]    Report Status PENDING  Incomplete  Culture,  blood (routine x 2)     Status: None (Preliminary result)   Collection Time: 12/06/18  9:16 PM   Specimen: BLOOD RIGHT ARM  Result Value Ref Range Status   Specimen Description BLOOD RIGHT ARM  Final   Special Requests   Final    BOTTLES DRAWN AEROBIC ONLY Blood Culture adequate volume   Culture   Final    NO GROWTH 3 DAYS Performed  at [facility], [place] [address], [place], [place] [address]    Report Status PENDING  Incomplete  MRSA PCR Screening     Status: None   Collection Time: 12/07/18  1:24 PM   Specimen: Nasal Mucosa; Nasopharyngeal  Result Value Ref Range Status   MRSA by PCR NEGATIVE NEGATIVE Final    Comment:        The GeneXpert MRSA Assay (FDA approved for NASAL specimens only), is one component of a comprehensive MRSA colonization surveillance program. It is not intended to diagnose MRSA infection nor to guide or monitor treatment for MRSA infections. Performed at [facility], [place] [address] [place], [place] [address]       Studies: No results found.  Scheduled Meds: . amLODipine  10 mg Oral Daily  . chlorhexidine  15 mL Mouth Rinse BID  . Chlorhexidine Gluconate Cloth  6 each Topical Daily  . cycloSPORINE  1 drop Both Eyes BID  . DULoxetine  60 mg Oral Daily  . enoxaparin (LOVENOX) injection  40 mg Subcutaneous Q24H  . fluticasone  1 spray Each Nare Daily  . furosemide  40 mg Oral Daily  . ipratropium-albuterol  3 mL Nebulization Q6H  . levothyroxine  225 mcg Oral QAC breakfast  . lisdexamfetamine  60 mg Oral Daily  . lithium carbonate  750 mg Oral QHS  . mouth rinse  15 mL Mouth Rinse q12n4p  . metoprolol tartrate  12.5 mg Oral BID  . montelukast  10 mg Oral Daily  . multivitamin with minerals  1 tablet Oral Daily  . nystatin   Topical BID  . polyvinyl alcohol  2 drop Both Eyes QHS  . potassium chloride  20 mEq Oral Daily  . pravastatin  20 mg Oral QHS  . thiamine  100 mg Oral Daily  . vitamin B-12  100 mcg Oral BID   . Vitamin D (Ergocalciferol)  50,000 Units Oral Q7 days    Continuous Infusions:   LOS: 4 days     Kayleen Memos, MD [facility] Pager [phone]  If 7PM-7AM, please contact night-coverage [email or web address] Password [email or web address] 12/10/2018, 1:58 PM

## 2018-12-10 NOTE — Progress Notes (Signed)
Pt husband brought Pt CPAP from home. Husband created a list of all the supplies that he brought in the CPAP bag. The list was placed in pt chart and in the CPAP bag. Will pass along to next RN

## 2018-12-10 NOTE — Progress Notes (Signed)
12/10/18 @ 0520:  Noticed new order to transfer patient to tele bed that was placed by Dr. Irene Pap at [phone].  Dr. [place] Crane is not currently on-call, so unable to clarify or discuss this order with Dr. [place] Crane.  Patient is currently on Bipap and has been on Bipap throughout the night.  A patient that is requiring Bipap per policy needs at minimum a Stepdown level of care.  Currently will keep patient on Stepdown unit and will have nursing discuss with rounding MD in the morning.  Jacqulyn Ducking ICU/SD RN4 / Care Coordinator / Rapid Response Nurse Rapid Response Number:  [phone] ICU Charge Nurse Number:  [phone]

## 2018-12-11 LAB — CBC WITH DIFFERENTIAL/PLATELET
Abs Immature Granulocytes: 0.23 10*3/uL — ABNORMAL HIGH (ref 0.00–0.07)
Basophils Absolute: 0.1 10*3/uL (ref 0.0–0.1)
Basophils Relative: 1 %
Eosinophils Absolute: 0.5 10*3/uL (ref 0.0–0.5)
Eosinophils Relative: 3 %
HCT: 47.6 % — ABNORMAL HIGH (ref 36.0–46.0)
Hemoglobin: 14.4 g/dL (ref 12.0–15.0)
Immature Granulocytes: 1 %
Lymphocytes Relative: 12 %
Lymphs Abs: 2 10*3/uL (ref 0.7–4.0)
MCH: 31.2 pg (ref 26.0–34.0)
MCHC: 30.3 g/dL (ref 30.0–36.0)
MCV: 103 fL — ABNORMAL HIGH (ref 80.0–100.0)
Monocytes Absolute: 1.7 10*3/uL — ABNORMAL HIGH (ref 0.1–1.0)
Monocytes Relative: 10 %
Neutro Abs: 12.3 10*3/uL — ABNORMAL HIGH (ref 1.7–7.7)
Neutrophils Relative %: 73 %
Platelets: 337 10*3/uL (ref 150–400)
RBC: 4.62 MIL/uL (ref 3.87–5.11)
RDW: 13.3 % (ref 11.5–15.5)
WBC: 16.7 10*3/uL — ABNORMAL HIGH (ref 4.0–10.5)
nRBC: 0 % (ref 0.0–0.2)

## 2018-12-11 MED ORDER — IPRATROPIUM-ALBUTEROL 0.5-2.5 (3) MG/3ML IN SOLN: 3.0000 mL | RESPIRATORY_TRACT | Status: DC | PRN

## 2018-12-11 MED ORDER — LORAZEPAM 0.5 MG PO TABS: 0.5000 mg | ORAL_TABLET | Freq: Two times a day (BID) | ORAL | 0 refills | Status: DC | PRN

## 2018-12-11 MED ORDER — LITHIUM CARBONATE 150 MG PO CAPS: 750.0000 mg | ORAL_CAPSULE | Freq: Every day | ORAL | 0 refills | Status: DC

## 2018-12-11 MED ORDER — GABAPENTIN 300 MG PO CAPS: 300.0000 mg | ORAL_CAPSULE | Freq: Every day | ORAL | 0 refills | Status: DC

## 2018-12-11 MED ORDER — ALBUTEROL SULFATE (2.5 MG/3ML) 0.083% IN NEBU: 2.5000 mg | INHALATION_SOLUTION | Freq: Two times a day (BID) | RESPIRATORY_TRACT | 0 refills | Status: DC | PRN

## 2018-12-11 MED ORDER — POTASSIUM CHLORIDE CRYS ER 20 MEQ PO TBCR: 20.0000 meq | EXTENDED_RELEASE_TABLET | Freq: Every day | ORAL | 0 refills | Status: DC

## 2018-12-11 MED ORDER — AMLODIPINE BESYLATE 10 MG PO TABS: 10.0000 mg | ORAL_TABLET | Freq: Every day | ORAL | 0 refills | Status: DC

## 2018-12-11 MED ORDER — FUROSEMIDE 40 MG PO TABS: 40.0000 mg | ORAL_TABLET | Freq: Every day | ORAL | 0 refills | Status: DC

## 2018-12-11 MED ORDER — ORAL CARE MOUTH RINSE
15.0000 mL | Freq: Two times a day (BID) | OROMUCOSAL | Status: DC
  Administered 2018-12-11: 15 mL via OROMUCOSAL

## 2018-12-11 MED ORDER — METOPROLOL TARTRATE 25 MG PO TABS: 12.5000 mg | ORAL_TABLET | Freq: Two times a day (BID) | ORAL | 0 refills | Status: AC

## 2018-12-11 NOTE — Progress Notes (Signed)
Occupational Therapy Treatment Patient Details Name: Jessica [name] MRN: [id number] DOB: 1950-06-02 Today's Date: 12/11/2018    History of present illness This 68 year old female was admitted for respiratory failure. PMH:  bipolar, spinal steonsis, lupus, chronic CHF and LE cellulitis   OT comments  Ambulated to bathroom and completed ADL.  Rest breaks given as pt fatiques easily.  VSS, but increased dyspnea/WOB   Follow Up Recommendations  Supervision/Assistance - 24 hour;Home health OT    Equipment Recommendations  3 in 1 bedside commode(wide)    Recommendations for Other Services      Precautions / Restrictions Precautions Precautions: Fall Restrictions Weight Bearing Restrictions: No       Mobility Bed Mobility         Supine to sit: Min assist     General bed mobility comments: for trunk  Transfers   Equipment used: Rolling walker (2 wheeled)   Sit to Stand: Min assist         General transfer comment: Cues for hand placement to turn completely prior to sitting    Balance                                           ADL either performed or assessed with clinical judgement   ADL           Upper Body Bathing: Minimal assistance;Sitting   Lower Body Bathing: Maximal assistance;Sit to/from stand   Upper Body Dressing : Minimal assistance       Toilet Transfer: Minimal assistance;BSC;RW;Ambulation   Toileting- Clothing Manipulation and Hygiene: Total assistance;Sit to/from stand         General ADL Comments: ambulated to bathroom for adl; pt could not reach for hygiene due to body habitus. Tends to fatique easily.  Took several rest breaks during ADL and chair brought closer to door at end of session     Vision       Perception     Praxis      Cognition Arousal/Alertness: Awake/alert Behavior During Therapy: [facility] for tasks assessed/performed;Flat affect Overall Cognitive Status: No family/caregiver present to  determine baseline cognitive functioning                                 General Comments: improved attention to task. Cues for safety wtih RW through tight spaces, for hand placement and to turn completely to chair/commode prior to sitting        Exercises     Shoulder Instructions       General Comments sats 96-97 on 4 liters; HR 80s and 90s.  Increased WOB, dyspnea 2/4    Pertinent Vitals/ Pain       Pain Assessment: No/denies pain  Home Living                                          Prior Functioning/Environment              Frequency  Min 2X/week        Progress Toward Goals  OT Goals(current goals can now be found in the care plan section)  Progress towards OT goals: Progressing toward goals     Plan  Co-evaluation                 AM-PAC OT "6 Clicks" Daily Activity     Outcome Measure   Help from another person eating meals?: None Help from another person taking care of personal grooming?: A Little Help from another person toileting, which includes using toliet, bedpan, or urinal?: A Lot Help from another person bathing (including washing, rinsing, drying)?: A Lot Help from another person to put on and taking off regular upper body clothing?: A Little Help from another person to put on and taking off regular lower body clothing?: A Lot 6 Click Score: 16    End of Session    OT Visit Diagnosis: Unsteadiness on feet (R26.81);History of falling (Z91.81)   Activity Tolerance Patient tolerated treatment well   Patient Left in chair;with call bell/phone within reach;with chair alarm set   Nurse Communication          Time: [phone] OT Time Calculation (min): 48 min  Charges: OT General Charges $OT Visit: 1 Visit OT Treatments $Self Care/Home Management : 38-52 mins  Lesle Chris, OTR/L Acute Rehabilitation Services [phone] [place] pager [phone]  office 12/11/2018   [place] 12/11/2018, 9:30 AM

## 2018-12-11 NOTE — Discharge Instructions (Signed)
CPAP and BPAP Information CPAP and BPAP are methods of helping a person breathe with the use of air pressure. CPAP stands for "continuous positive airway pressure." BPAP stands for "bi-level positive airway pressure." In both methods, air is blown through your nose or mouth and into your air passages to help you breathe well. CPAP and BPAP use different amounts of pressure to blow air. With CPAP, the amount of pressure stays the same while you breathe in and out. With BPAP, the amount of pressure is increased when you breathe in (inhale) so that you can take larger breaths. Your health care provider will recommend whether CPAP or BPAP would be more helpful for you. Why are CPAP and BPAP treatments used? CPAP or BPAP can be helpful if you have:  Sleep apnea.  Chronic obstructive pulmonary disease (COPD).  Heart failure.  Medical conditions that weaken the muscles of the chest including muscular dystrophy, or neurological diseases such as amyotrophic lateral sclerosis (ALS).  Other problems that cause breathing to be weak, abnormal, or difficult. CPAP is most commonly used for obstructive sleep apnea (OSA) to keep the airways from collapsing when the muscles relax during sleep. How is CPAP or BPAP administered? Both CPAP and BPAP are provided by a small machine with a flexible plastic tube that attaches to a plastic mask. You wear the mask. Air is blown through the mask into your nose or mouth. The amount of pressure that is used to blow the air can be adjusted on the machine. Your health care provider will determine the pressure setting that should be used based on your individual needs. When should CPAP or BPAP be used? In most cases, the mask only needs to be worn during sleep. Generally, the mask needs to be worn throughout the night and during any daytime naps. People with certain medical conditions may also need to wear the mask at other times when they are awake. Follow instructions from your  health care provider about when to use the machine. What are some tips for using the mask?   Because the mask needs to be snug, some people feel trapped or closed-in (claustrophobic) when first using the mask. If you feel this way, you may need to get used to the mask. One way to do this is by holding the mask loosely over your nose or mouth and then gradually applying the mask more snugly. You can also gradually increase the amount of time that you use the mask.  Masks are available in various types and sizes. Some fit over your mouth and nose while others fit over just your nose. If your mask does not fit well, talk with your health care provider about getting a different one.  If you are using a mask that fits over your nose and you tend to breathe through your mouth, a chin strap may be applied to help keep your mouth closed.  The CPAP and BPAP machines have alarms that may sound if the mask comes off or develops a leak.  If you have trouble with the mask, it is very important that you talk with your health care provider about finding a way to make the mask easier to tolerate. Do not stop using the mask. Stopping the use of the mask could have a negative impact on your health. What are some tips for using the machine?  Place your CPAP or BPAP machine on a secure table or stand near an electrical outlet.  Know where the on/off  switch is located on the machine.  Follow instructions from your health care provider about how to set the pressure on your machine and when you should use it.  Do not eat or drink while the CPAP or BPAP machine is on. Food or fluids could get pushed into your lungs by the pressure of the CPAP or BPAP.  Do not smoke. Tobacco smoke residue can damage the machine.  For home use, CPAP and BPAP machines can be rented or purchased through home health care companies. Many different brands of machines are available. Renting a machine before purchasing may help you find out  which particular machine works well for you.  Keep the CPAP or BPAP machine and attachments clean. Ask your health care provider for specific instructions. Get help right away if:  You have redness or open areas around your nose or mouth where the mask fits.  You have trouble using the CPAP or BPAP machine.  You cannot tolerate wearing the CPAP or BPAP mask.  You have pain, discomfort, and bloating in your abdomen. Summary  CPAP and BPAP are methods of helping a person breathe with the use of air pressure.  Both CPAP and BPAP are provided by a small machine with a flexible plastic tube that attaches to a plastic mask.  If you have trouble with the mask, it is very important that you talk with your health care provider about finding a way to make the mask easier to tolerate. This information is not intended to replace advice given to you by your health care provider. Make sure you discuss any questions you have with your health care provider. Document Released: 01/16/2004 Document Revised: 08/09/2018 Document Reviewed: 03/08/2016 Elsevier Patient Education  [place] [facility].   Sleep Apnea Sleep apnea affects breathing during sleep. It causes breathing to stop for a short time or to become shallow. It can also increase the risk of:  Heart attack.  Stroke.  Being very overweight (obese).  Diabetes.  Heart failure.  Irregular heartbeat. The goal of treatment is to help you breathe normally again. What are the causes? There are three kinds of sleep apnea:  Obstructive sleep apnea. This is caused by a blocked or collapsed airway.  Central sleep apnea. This happens when the brain does not send the right signals to the muscles that control breathing.  Mixed sleep apnea. This is a combination of obstructive and central sleep apnea. The most common cause of this condition is a collapsed or blocked airway. This can happen if:  Your throat muscles are too relaxed.  Your  tongue and tonsils are too large.  You are overweight.  Your airway is too small. What increases the risk?  Being overweight.  Smoking.  Having a small airway.  Being older.  Being female.  Drinking alcohol.  Taking medicines to calm yourself (sedatives or tranquilizers).  Having family members with the condition. What are the signs or symptoms?  Trouble staying asleep.  Being sleepy or tired during the day.  Getting angry a lot.  Loud snoring.  Headaches in the morning.  Not being able to focus your mind (concentrate).  Forgetting things.  Less interest in sex.  Mood swings.  Personality changes.  Feelings of sadness (depression).  Waking up a lot during the night to pee (urinate).  Dry mouth.  Sore throat. How is this diagnosed?  Your medical history.  A physical exam.  A test that is done when you are sleeping (sleep study). The  test is most often done in a sleep lab but may also be done at home. How is this treated?   Sleeping on your side.  Using a medicine to get rid of mucus in your nose (decongestant).  Avoiding the use of alcohol, medicines to help you relax, or certain pain medicines (narcotics).  Losing weight, if needed.  Changing your diet.  Not smoking.  Using a machine to open your airway while you sleep, such as: ? An oral appliance. This is a mouthpiece that shifts your lower jaw forward. ? A CPAP device. This device blows air through a mask when you breathe out (exhale). ? An EPAP device. This has valves that you put in each nostril. ? A BPAP device. This device blows air through a mask when you breathe in (inhale) and breathe out.  Having surgery if other treatments do not work. It is important to get treatment for sleep apnea. Without treatment, it can lead to:  High blood pressure.  Coronary artery disease.  In men, not being able to have an erection (impotence).  Reduced thinking ability. Follow these  instructions at home: Lifestyle  Make changes that your doctor recommends.  Eat a healthy diet.  Lose weight if needed.  Avoid alcohol, medicines to help you relax, and some pain medicines.  Do not use any products that contain nicotine or tobacco, such as cigarettes, e-cigarettes, and chewing tobacco. If you need help quitting, ask your doctor. General instructions  Take over-the-counter and prescription medicines only as told by your doctor.  If you were given a machine to use while you sleep, use it only as told by your doctor.  If you are having surgery, make sure to tell your doctor you have sleep apnea. You may need to bring your device with you.  Keep all follow-up visits as told by your doctor. This is important. Contact a doctor if:  The machine that you were given to use during sleep bothers you or does not seem to be working.  You do not get better.  You get worse. Get help right away if:  Your chest hurts.  You have trouble breathing in enough air.  You have an uncomfortable feeling in your back, arms, or stomach.  You have trouble talking.  One side of your body feels weak.  A part of your face is hanging down. These symptoms may be an emergency. Do not wait to see if the symptoms will go away. Get medical help right away. Call your local emergency services (911 in the [place] Do not drive yourself to the hospital. Summary  This condition affects breathing during sleep.  The most common cause is a collapsed or blocked airway.  The goal of treatment is to help you breathe normally while you sleep. This information is not intended to replace advice given to you by your health care provider. Make sure you discuss any questions you have with your health care provider. Document Released: 01/27/2008 Document Revised: 02/03/2018 Document Reviewed: 12/13/2017 Elsevier Patient Education  2020 [place] With Sleep Apnea Sleep apnea is a condition  in which breathing pauses or becomes shallow during sleep. Sleep apnea is most commonly caused by a collapsed or blocked airway. People with sleep apnea snore loudly and have times when they gasp and stop breathing for 10 seconds or more during sleep. This happens over and over during the night. This disrupts your sleep and keeps your body from getting the rest that  it needs, which can cause tiredness and lack of energy (fatigue) during the day. The breaks in breathing also interrupt the deep sleep that you need to feel rested. Even if you do not completely wake up from the gaps in breathing, your sleep may not be restful. You may also have a headache in the morning and low energy during the day, and you may feel anxious or depressed. How can sleep apnea affect me? Sleep apnea increases your chances of extreme tiredness during the day (daytime fatigue). It can also increase your risk for health conditions, such as:  Heart attack.  Stroke.  Diabetes.  Heart failure.  Irregular heartbeat.  High blood pressure. If you have daytime fatigue as a result of sleep apnea, you may be more likely to:  Perform poorly at school or work.  Fall asleep while driving.  Have difficulty with attention.  Develop depression or anxiety.  Become severely overweight (obese).  Have sexual dysfunction. What actions can I take to manage sleep apnea? Sleep apnea treatment   If you were given a device to open your airway while you sleep, use it only as told by your health care provider. You may be given: ? An oral appliance. This is a custom-made mouthpiece that shifts your lower jaw forward. ? A continuous positive airway pressure (CPAP) device. This device blows air through a mask when you breathe out (exhale). ? A nasal expiratory positive airway pressure (EPAP) device. This device has valves that you put into each nostril. ? A bi-level positive airway pressure (BPAP) device. This device blows air through  a mask when you breathe in (inhale) and breathe out (exhale).  You may need surgery if other treatments do not work for you. Sleep habits  Go to sleep and wake up at the same time every day. This helps set your internal clock (circadian rhythm) for sleeping. ? If you stay up later than usual, such as on weekends, try to get up in the morning within 2 hours of your normal wake time.  Try to get at least 7-9 hours of sleep each night.  Stop computer, tablet, and mobile phone use a few hours before bedtime.  Do not take long naps during the day. If you nap, limit it to 30 minutes.  Have a relaxing bedtime routine. Reading or listening to music may relax you and help you sleep.  Use your bedroom only for sleep. ? Keep your television and computer out of your bedroom. ? Keep your bedroom cool, dark, and quiet. ? Use a supportive mattress and pillows.  Follow your health care provider's instructions for other changes to sleep habits. Nutrition  Do not eat heavy meals in the evening.  Do not have caffeine in the later part of the day. The effects of caffeine can last for more than 5 hours.  Follow your health care provider's or dietitian's instructions for any diet changes. Lifestyle      Do not drink alcohol before bedtime. Alcohol can cause you to fall asleep at first, but then it can cause you to wake up in the middle of the night and have trouble getting back to sleep.  Do not use any products that contain nicotine or tobacco, such as cigarettes and e-cigarettes. If you need help quitting, ask your health care provider. Medicines  Take over-the-counter and prescription medicines only as told by your health care provider.  Do not use over-the-counter sleep medicine. You can become dependent on this medicine, and  it can make sleep apnea worse.  Do not use medicines, such as sedatives and narcotics, unless told by your health care provider. Activity  Exercise on most days, but  avoid exercising in the evening. Exercising near bedtime can interfere with sleeping.  If possible, spend time outside every day. Natural light helps regulate your circadian rhythm. General information  Lose weight if you need to, and maintain a healthy weight.  Keep all follow-up visits as told by your health care provider. This is important.  If you are having surgery, make sure to tell your health care provider that you have sleep apnea. You may need to bring your device with you. Where to find more information Learn more about sleep apnea and daytime fatigue from:  American Sleep Association: sleepassociation.[place]: sleepfoundation.org  National Heart, Lung, and Blood Institute: https://www.[place].biz/ Summary  Sleep apnea can cause daytime fatigue and other serious health conditions.  Both sleep apnea and daytime fatigue can be bad for your health and well-being.  You may need to wear a device while sleeping to help keep your airway open.  If you are having surgery, make sure to tell your health care provider that you have sleep apnea. You may need to bring your device with you.  Making changes to sleep habits, diet, lifestyle, and activity can help you manage sleep apnea. This information is not intended to replace advice given to you by your health care provider. Make sure you discuss any questions you have with your health care provider. Document Released: 07/14/2017 Document Revised: 08/11/2018 Document Reviewed: 07/14/2017 Elsevier Patient Education  [place].

## 2018-12-11 NOTE — Progress Notes (Addendum)
SATURATION QUALIFICATIONS: (This note is used to comply with regulatory documentation for home oxygen)  Patient Saturations on Room Air at Rest = 93%  Patient Saturations on Room Air while Ambulating = 90%  Patient Saturations on 2L Liters of oxygen while Ambulating = 95%

## 2018-12-11 NOTE — TOC Initial Note (Signed)
Transition of Care [facility]) - Initial/Assessment Note    Patient Details  Name: Jessica [name] MRN: [id number] Date of Birth: 1950-12-13  Transition of Care [facility]) CM/SW Contact:    Joaquin Courts, RN Phone Number: 12/11/2018, 2:37 PM  Clinical Narrative:   CM spoke with patient at bedside. Patient is active with [facility] for [facility] services, agency will resume care at dc. Patient reports she has rollator, 3-in-1, and oxygen at home.  Spouse will bring a portable O2 tank for transportation home.                 Expected Discharge Plan: [place] Barriers to Discharge: No Barriers Identified   Patient Goals and CMS Choice Patient states their goals for this hospitalization and ongoing recovery are:: to go home CMS Medicare.gov Compare Post Acute Care list provided to:: Patient Choice offered to / list presented to : Patient  Expected Discharge Plan and Services Expected Discharge Plan: [place]   Discharge Planning Services: CM Consult Post Acute Care Choice: [place] arrangements for the past 2 months: Single Family Home Expected Discharge Date: 12/11/18               DME Arranged: N/A DME Agency: NA       HH Arranged: RN, OT, PT, Nurse's Aide [place] Agency: [place] Date HH Agency Contacted: 12/11/18 Time HH Agency Contacted: 43 Representative spoke with at [place]: Sharmon Revere  Prior Living Arrangements/Services Living arrangements for the past 2 months: [place] with:: Spouse Patient language and need for interpreter reviewed:: Yes Do you feel safe going back to the place where you live?: Yes      Need for Family Participation in Patient Care: Yes (Comment) Care giver support system in place?: Yes (comment)   Criminal Activity/Legal Involvement Pertinent to Current Situation/Hospitalization: No - Comment as needed  Activities of Daily Living Home Assistive Devices/Equipment:  Bedside commode/3-in-1, Oxygen, Walker (specify type), CPAP(rollator) ADL Screening (condition at time of admission) Patient's cognitive ability adequate to safely complete daily activities?: Yes Is the patient deaf or have difficulty hearing?: No Does the patient have difficulty seeing, even when wearing glasses/contacts?: No Does the patient have difficulty concentrating, remembering, or making decisions?: Yes Patient able to express need for assistance with ADLs?: Yes Does the patient have difficulty dressing or bathing?: Yes Independently performs ADLs?: No Communication: Independent Dressing (OT): Needs assistance Is this a change from baseline?: Change from baseline, expected to last >3 days Grooming: Needs assistance Is this a change from baseline?: Change from baseline, expected to last >3 days Feeding: Needs assistance Is this a change from baseline?: Change from baseline, expected to last >3 days Bathing: Needs assistance Is this a change from baseline?: Change from baseline, expected to last >3 days Toileting: Needs assistance Is this a change from baseline?: Pre-admission baseline In/Out Bed: Needs assistance Is this a change from baseline?: Pre-admission baseline Walks in Home: Needs assistance Is this a change from baseline?: Pre-admission baseline Does the patient have difficulty walking or climbing stairs?: Yes(secondary to shortness of breath and lethargy) Weakness of Legs: Both Weakness of Arms/Hands: Both  Permission Sought/Granted                  Emotional Assessment Appearance:: Appears stated age Attitude/Demeanor/Rapport: Engaged Affect (typically observed): Accepting Orientation: : Oriented to Place, Oriented to  Time, Oriented to Situation, Oriented to Self   Psych Involvement: No (  comment)  Admission diagnosis:  Morbid obesity ([place]) [E66.01] OSA (obstructive sleep apnea) [G47.33] Acute on chronic respiratory failure with hypercapnia (HCC)  [J96.22] Acute on chronic congestive heart failure, unspecified heart failure type [facility]) [I50.9] Patient Active Problem List   Diagnosis Date Noted  . Hypercapnic respiratory failure ([place]) 12/06/2018  . Acute on chronic diastolic CHF (congestive heart failure) ([place])   . Acute metabolic encephalopathy   . Lower extremity edema 11/30/2018  . Cellulitis of left leg 11/30/2018  . Systemic lupus ([place])   . Morbid obesity ([place])   . Bipolar disorder ([place])   . Cellulitis 11/29/2018  . OSA (obstructive sleep apnea) 11/27/2018  . Chronic hypoxemic respiratory failure ([place]) 11/27/2018  . Primary osteoarthritis of both hands 04/19/2017  . Primary osteoarthritis of both knees 04/19/2017  . Primary osteoarthritis of both feet 04/19/2017  . DDD (degenerative disc disease), lumbar 04/19/2017  . History of bipolar disorder 04/19/2017  . Hypothyroidism 06/04/2014  . Chest pain 01/23/2013  . ARF (acute renal failure) ([place]) 01/23/2013  . HTN (hypertension) 01/23/2013  . Leukocytosis 01/23/2013  . HLD (hyperlipidemia) 01/23/2013   PCP:  Alroy Dust, L.Marlou Sa, MD Pharmacy:   [id number] - [place], [place] [address] [place] [address] Phone: [phone] Fax: [phone]     Social Determinants of Health (SDOH) Interventions    Readmission Risk Interventions No flowsheet data found.

## 2018-12-11 NOTE — Progress Notes (Signed)
Discharge education provided to patient, all questions answered.  RN reviewed belongings with patient before discharge.  Patient's home oxygen brought in by husband for discharge. PIV removed. Patient wheeled down to main entrance by RN.

## 2018-12-11 NOTE — Progress Notes (Signed)

## 2018-12-11 NOTE — Discharge Summary (Addendum)
Discharge Summary  Jessica [name]:[id number] DOB: 24-Jan-1951  PCP: Alroy Dust, L.Marlou Sa, MD  Admit date: 12/06/2018 Discharge date: 12/11/2018  Time spent: 35 minutes  Recommendations for Outpatient Follow-up:  1. Follow-up with your primary care provider 2. Follow-up with your psychiatrist 3. Follow-up with your pulmonologist 4. Follow-up with your cardiologist 5. Take your medications as prescribed 6. Continue physical therapy 7. Fall precautions.  Home O2 evaluation results done on 12/11/18: SATURATION QUALIFICATIONS: (This note is used to comply with regulatory documentation for home oxygen)  Patient Saturations on Room Air at Rest = 93%  Patient Saturations on Room Air while Ambulating = 90%  Patient Saturations on 2L Liters of oxygen while Ambulating = 95% Naomie Dean, RN  Registered Nurse     Discharge Diagnoses:  Active Hospital Problems   Diagnosis Date Noted   Hypercapnic respiratory failure ([place]) 12/06/2018   Morbid obesity ([place])    OSA (obstructive sleep apnea) 11/27/2018    Resolved Hospital Problems  No resolved problems to display.    Discharge Condition: Stable  Diet recommendation: Resume previous diet, heart healthy low-sodium carb modified diet.  Vitals:   12/11/18 1200 12/11/18 1510  BP: 137/69   Pulse: 82   Resp: (!) 26   Temp:  97.9 F (36.6 C)  SpO2: 94%     History of present illness:   Jessica Olejniczak [place] a 68 y.o.female, very pleasant, with past medical history significant for bipolar disorder, chronic depression/anxiety, essential hypertension, hyperlipidemia, hypothyroidism, IBS, spinal stenosis, lupus, obesity,OSAon cpap, chronic hypoxemic respiratory failure on home oxygen (2 L at rest and 4 L with activity/sleep) who is recently discharged from hospital after being treated for acute on chronic diastolic CHF and lower extremity cellulitis, returned to the ED due to lethargy, hypoxia, and bilateral  lower extremity edema.  Presented with PCO2 greater than 80 on ABG with low pH requiring noninvasive mechanical ventilation, BiPAP.    Admitted for acute metabolic encephalopathy, acute on chronic hypoxic hypercarbic respiratory failure, and acute on chronic diastolic CHF.  Diuresed and was on BIPAP until evening of 12/09/18 with compliance. Improvement of mentation and respiratory acidosis. Weaned off BIPAP and tolerating well.    12/11/18: Patient was seen and examined at her bedside this morning.  No acute events overnight.  States she feels much better.  She denies chest pain, palpitations or dyspnea at rest.  No new complaints.  Vital signs and labs reviewed and are stable.  Patient is stable for discharge to home with home health services and DME oxygen and DME neb machine.   Hospital Course:  Active Problems:   OSA (obstructive sleep apnea)   Morbid obesity ([facility])   Hypercapnic respiratory failure ([facility])  Resolved acute metabolic encephalopathy likely multifactorial secondary to severe acute on chronic hypercapnia and hypoxia versus polypharmacy Presented with PCO2 greater than 80 on ABG with low pH, placed on BiPAP Repeat ABG done on 12/07/2018 improved with PCO2 in the 60s and normal pH. Repeat ABG done on 12/09/2018 PCO2 45, PO2 65.5 and pH 7.505.  She is alert and oriented x4.  Resolving acute on chronic hypoxic hypercarbic respiratory failure likely multifactorial secondary to acute on chronic diastolic CHF, obesity hypoventilation syndrome, and OSA On oxygen supplementation at baseline 2 L at rest and 4 L with ambulation In the hospital was kept on BiPAP at night due to hypercarbia until the evening of 12/09/18. Repeat ABG on 12/09/2018 PCO2 45.  Weaned off BiPAP. Continue bronchodilators and diuretics Maintain O2  saturation greater than 90% Continue compliance with CPAP at night Follow-up with your pulmonologist  OSA on CPAP Continue CPAP nightly  Leukocytosis, likely  secondary to lithium use Procalcitonin less than 0.10 on 12/09/2018 Afebrile Blood cultures negative to date No sign of active infective process  Chronic bilateral lower extremity edema and hyperpigmentation likely secondary to chronic venous stasis Nontender on palpation, no warmth, no sign of active infective process. Recently treated for cellulitis of lower extremities. Afebrile  Polyneuropathy, mainly affecting lower extremities Cutdown dose of gabapentin from 600 mg nightly to 300 mg nightly Follow-up with your PCP  Improving hypercalcemia, unclear etiology Calcium level 11.5, albumin 3.8 >> corrected calcium for albumin 11.7. Calcium level 11.1 on 12/10/2018 while on diuretics Continue Lasix, 40 mg daily  Resolving pulmonary edema likely cardiogenic secondary to acute on chronic diastolic CHF Personally reviewed chest x-ray done on admission which showed cardiomegaly with increase in pulmonary vascularity indicative of pulmonary edema Last 2D echo done on 12/02/2018 shows preserved LVEF 60 to 65% Continue strict I's and O's and daily weight Completed IV Lasix 60 mg 3 times daily Switch to home dose oral Lasix 40 mg daily Supplement with KCl 20 mEq daily while on diuretics Net I&O -9.5 L since admission Weight 129 kg on 12/11/2018 from weight 145 kg on 11/22/2018. Follow-up with your cardiologist  Resolved hypokalemia post repletion, diuretics induced Replete as indicated Continue KCl p.o. 20 mEq daily while on diuretics, p.o. Lasix 40 mg daily.  Uncontrolled hypertension Norvasc increased to 10 mg daily Resume home dose p.o. Lasix 40 mg daily Continue metoprolol tartrate 12.5 mg twice daily, added on 12/09/2018 Blood pressure is at goal Follow-up with your PCP  Chronic anxiety/depression/bipolar disorder Follow-up with your psychiatrist Avoid polypharmacy  Hypothyroidism Continue Synthroid  Hyperlipidemia Continue atorvastatin   Severe morbid obesity with  concern for obesity hypoventilation syndrome BMI 84 Recommend weight loss outpatient with regular physical activity and healthy dieting  Physical debility PT OT recommended home health PT OT Continue physical activity at home with home health PT OT Fall precautions    Consultants:none  Code Status:DNR    Discharge Exam: BP 137/69 (BP Location: Right Arm)    Pulse 82    Temp 97.9 F (36.6 C) (Oral)    Resp (!) 26    Ht 5\' 2"  (1.575 m)    Wt 129.9 kg    SpO2 94%    BMI 52.38 kg/m   General: 68 y.o. year-old female well developed well nourished in no acute distress.  Alert and oriented x3.  Cardiovascular: Regular rate and rhythm with no rubs or gallops.  No thyromegaly or JVD noted.    Respiratory: Clear to auscultation with no wheezes or rales. Good inspiratory effort.  Abdomen: Soft morbidly obese, nontender nondistended with normal bowel sounds x4 quadrants.  Musculoskeletal: Trace lower extremity edema. 2/4 pulses in all 4 extremities.  Psychiatry: Mood is appropriate for condition and setting  Discharge Instructions You were cared for by a hospitalist during your hospital stay. If you have any questions about your discharge medications or the care you received while you were in the hospital after you are discharged, you can call the unit and asked to speak with the hospitalist on call if the hospitalist that took care of you is not available. Once you are discharged, your primary care physician will handle any further medical issues. Please note that NO REFILLS for any discharge medications will be authorized once you are discharged, as it is  imperative that you return to your primary care physician (or establish a relationship with a primary care physician if you do not have one) for your aftercare needs so that they can reassess your need for medications and monitor your lab values.   Allergies as of 12/11/2018      Reactions   Ibuprofen Diarrhea, Nausea Only,  Other (See Comments)   Extreme stomach pain  Makes her feel bad   Codeine    UNSPECIFIED REACTION    Nabumetone    UNSPECIFIED REACTION    Promethazine    UNSPECIFIED REACTION    Amoxicillin-pot Clavulanate Nausea And Vomiting   Erythromycin Base Diarrhea   Promethazine-codeine Other (See Comments)   "Makes her feel bad"      Medication List    STOP taking these medications   celecoxib 200 MG capsule Commonly known as: CELEBREX   doxycycline 100 MG tablet Commonly known as: ADOXA   HYDROcodone-acetaminophen 5-325 MG tablet Commonly known as: Norco   methocarbamol 500 MG tablet Commonly known as: ROBAXIN   mirabegron ER 50 MG Tb24 tablet Commonly known as: MYRBETRIQ   MURO 128 OP   nystatin powder Commonly known as: MYCOSTATIN/NYSTOP   potassium chloride 20 MEQ packet Commonly known as: KLOR-CON   Tylenol Arthritis Pain 650 MG CR tablet Generic drug: acetaminophen   valsartan 80 MG tablet Commonly known as: DIOVAN     TAKE these medications   albuterol 108 (90 Base) MCG/ACT inhaler Commonly known as: VENTOLIN HFA Inhale 2 puffs into the lungs every 6 (six) hours as needed for wheezing or shortness of breath. What changed: Another medication with the same name was added. Make sure you understand how and when to take each.   albuterol (2.5 MG/3ML) 0.083% nebulizer solution Commonly known as: PROVENTIL Take 3 mLs (2.5 mg total) by nebulization 2 (two) times daily as needed for wheezing or shortness of breath. What changed: You were already taking a medication with the same name, and this prescription was added. Make sure you understand how and when to take each.   amLODipine 10 MG tablet Commonly known as: NORVASC Take 1 tablet (10 mg total) by mouth daily. Start taking on: December 12, 2018 What changed:   medication strength  how much to take  when to take this   clotrimazole-betamethasone cream Commonly known as: LOTRISONE Apply 1 application  topically 2 (two) times daily as needed (rash).   DULoxetine 60 MG capsule Commonly known as: CYMBALTA TAKE 1 CAPSULE BY MOUTH EVERY DAY IN THE MORNING What changed: See the new instructions.   econazole nitrate 1 % cream Apply 1 application topically 3 (three) times daily as needed (fungus).   fluticasone 50 MCG/ACT nasal spray Commonly known as: FLONASE Place 1 spray into both nostrils daily.   furosemide 40 MG tablet Commonly known as: Lasix Take 1 tablet (40 mg total) by mouth daily. Start taking on: December 12, 2018 What changed:   how much to take  how to take this  when to take this  additional instructions   gabapentin 300 MG capsule Commonly known as: NEURONTIN Take 1 capsule (300 mg total) by mouth at bedtime. What changed: how much to take   ketoconazole 2 % cream Commonly known as: NIZORAL Apply 1 application topically 3 (three) times daily as needed for irritation.   lisdexamfetamine 60 MG capsule Commonly known as: Vyvanse Take 1 capsule (60 mg total) by mouth every morning.   lithium carbonate 150 MG capsule Take 5  capsules (750 mg total) by mouth at bedtime for 20 days. What changed:   See the new instructions.  Another medication with the same name was removed. Continue taking this medication, and follow the directions you see here.   loratadine 10 MG tablet Commonly known as: CLARITIN Take 10 mg by mouth daily as needed for allergies.   LORazepam 0.5 MG tablet Commonly known as: ATIVAN Take 1 tablet (0.5 mg total) by mouth 2 (two) times daily as needed for anxiety. What changed: when to take this   metoprolol tartrate 25 MG tablet Commonly known as: LOPRESSOR Take 0.5 tablets (12.5 mg total) by mouth 2 (two) times daily.   montelukast 10 MG tablet Commonly known as: SINGULAIR Take 1 tablet by mouth daily.   PreserVision AREDS 2 Caps Take 1 capsule 2 (two) times daily by mouth.   multivitamin with minerals tablet Take 1 tablet by  mouth daily.   NAC 600 MG Caps Generic drug: Acetylcysteine Take 600 mg by mouth 2 (two) times daily.   potassium chloride SA 20 MEQ tablet Commonly known as: K-DUR Take 1 tablet (20 mEq total) by mouth daily. Start taking on: December 12, 2018   pravastatin 20 MG tablet Commonly known as: PRAVACHOL Take 20 mg at bedtime by mouth.   REFRESH OPTIVE OP Place 1-2 drops into both eyes 4 (four) times daily as needed (dry eyes).   Restasis 0.05 % ophthalmic emulsion Generic drug: cycloSPORINE Place 1 drop into both eyes 2 (two) times daily.   Synthroid 25 MCG tablet Generic drug: levothyroxine Take 225 mcg by mouth daily before breakfast. What changed: Another medication with the same name was removed. Continue taking this medication, and follow the directions you see here.   vitamin B-12 100 MCG tablet Commonly known as: CYANOCOBALAMIN Take 100 mcg by mouth 2 (two) times daily.   Vitamin B1 100 MG Tabs Take 1 tablet by mouth daily.   Vitamin D (Ergocalciferol) 1.25 MG (50000 UT) Caps capsule Commonly known as: DRISDOL Take 50,000 Units by mouth every 7 (seven) days.            Durable Medical Equipment  (From admission, onward)         Start     Ordered   12/11/18 1313  For home use only DME Nebulizer machine  Once    Question Answer Comment  Patient needs a nebulizer to treat with the following condition COPD (chronic obstructive pulmonary disease) ([place])   Length of Need 6 Months      12/11/18 1312   12/08/18 1707  For home use only DME 3 n 1  Once     12/08/18 1706         Allergies  Allergen Reactions   Ibuprofen Diarrhea, Nausea Only and Other (See Comments)    Extreme stomach pain  Makes her feel bad   Codeine     UNSPECIFIED REACTION    Nabumetone     UNSPECIFIED REACTION    Promethazine     UNSPECIFIED REACTION    Amoxicillin-Pot Clavulanate Nausea And Vomiting   Erythromycin Base Diarrhea   Promethazine-Codeine Other (See Comments)     "Makes her feel bad"   Follow-up Information    Mitchell, L.Marlou Sa, MD. Call in 1 day(s).   Specialty: Family Medicine Contact information: [address] [place] [address] [phone]        Purnell Shoemaker., MD. Call in 1 day(s).   Specialty: Psychiatry Why: Please  call for a post hospital follow-up appointment. Contact information: [address] [place] [address] [phone]        Margaretha Seeds, MD. Call in 1 day(s).   Specialty: Pulmonary Disease Why: Please call for a post hospital follow-up appointment. Contact information: [address] [place] [address] [phone]        Dorothy Spark, MD. Call in 1 day(s).   Specialty: Cardiology Why: Please call for a post hospital follow-up appointment. Contact information: [place] [address] [phone]        [facility] Follow up.   Why: Agency will provide home health physical therapy, occupational therapy, aide, and nurse. agency will call you to schedule first visit. Contact information: [place] [address] [phone]            The results of significant diagnostics from this hospitalization (including imaging, microbiology, ancillary and laboratory) are listed below for reference.    Significant Diagnostic Studies: Dg Chest 2 View  Result Date: 11/17/2018 CLINICAL DATA:  Pt c/o hypoxia x 3 weeks. Hx of dyspnea, HTN, systemic lupus. No to all COVID questions. EXAM: CHEST - 2 VIEW COMPARISON:  04/14/2015 FINDINGS: Mild enlargement of the cardiopericardial silhouette. No mediastinal or hilar masses. No evidence of adenopathy. Lungs are hyperexpanded. There prominent bronchovascular markings most evident lung bases. No evidence of pneumonia or pulmonary edema. No pleural effusion or pneumothorax. Skeletal structures are intact. IMPRESSION: 1. No acute cardiopulmonary  disease. 2. Mild cardiomegaly, which appears increased in size prior exams. Hyperexpanded lungs prominent bronchovascular markings, but no pulmonary edema. Electronically Signed   By: Lajean Manes M.D.   On: 11/17/2018 09:40   Dg Chest Port 1 View  Result Date: 12/09/2018 CLINICAL DATA:  Hypoxia and shortness of breath. Hypertension and bronchitis. EXAM: PORTABLE CHEST 1 VIEW COMPARISON:  12/06/2018 FINDINGS: Numerous leads and wires project over the chest. Midline trachea. Cardiomegaly accentuated by AP portable technique. No right and no definite left pleural effusion. No pneumothorax. The Chin overlies the apices. Given AP portable technique and low lung volumes, no residual pulmonary edema. Minimal thickening of the right minor fissure. Subsegmental atelectasis at both lung bases. IMPRESSION: Cardiomegaly with resolution of congestive heart failure. Electronically Signed   By: Abigail Miyamoto M.D.   On: 12/09/2018 13:59   Dg Chest Port 1 View  Result Date: 12/06/2018 CLINICAL DATA:  Shortness of breath. EXAM: PORTABLE CHEST 1 VIEW COMPARISON:  Chest x-rays dated 11/16/2018 and 04/14/2015 FINDINGS: Persistent cardiomegaly. Increased pulmonary vascular prominence particularly of the main pulmonary arteries. Increased density at the lung bases may represent small effusions or slight pulmonary edema. Haziness at the aortic arch probably represents edema or atelectasis. No acute bone abnormality. IMPRESSION: Findings consistent with congestive heart failure. Electronically Signed   By: Lorriane Shire M.D.   On: 12/06/2018 16:15   Vas [place] Lower Extremity Venous (dvt)  Result Date: 11/30/2018  Lower Venous Study Indications: Swelling, Erythema, and Pain.  Limitations: Body habitus, poor ultrasound/tissue interface and pannus inhibiting access to groin and thigh. Performing Technologist: Maudry Mayhew MHA, RDMS, RVT, RDCS  Examination Guidelines: A complete evaluation includes B-mode imaging, spectral  Doppler, color Doppler, and power Doppler as needed of all accessible portions of each vessel. Bilateral testing is considered an integral part of a complete examination. Limited examinations for reoccurring indications may be performed as noted.  +-----+---------------+---------+-----------+----------+--------------+  RIGHT Compressibility Phasicity Spontaneity  Properties Summary         +-----+---------------+---------+-----------+----------+--------------+  CFV                                                    Not visualized  +-----+---------------+---------+-----------+----------+--------------+   +---------+---------------+---------+-----------+----------+-------------------+  LEFT      Compressibility Phasicity Spontaneity Properties Summary              +---------+---------------+---------+-----------+----------+-------------------+  CFV                                                        Not visualized       +---------+---------------+---------+-----------+----------+-------------------+  SFJ                                                        Not visualized       +---------+---------------+---------+-----------+----------+-------------------+  FV Prox                                                    Not visualized       +---------+---------------+---------+-----------+----------+-------------------+  FV Mid                                                     Not visualized       +---------+---------------+---------+-----------+----------+-------------------+  FV Distal Full                                                                  +---------+---------------+---------+-----------+----------+-------------------+  PFV                                                        Not visualized       +---------+---------------+---------+-----------+----------+-------------------+  POP                       Yes       Yes                    Unable to obtain  adequate position                                                                to perform                                                                       compression. Patent                                                              by color Doppler.    +---------+---------------+---------+-----------+----------+-------------------+  PTV       Full                                             Limited                                                                          visualization        +---------+---------------+---------+-----------+----------+-------------------+  PERO                                                       Not visualized       +---------+---------------+---------+-----------+----------+-------------------+     Summary: Left: There is no evidence of deep vein thrombosis in the lower extremity. However, portions of this examination were limited- see technologist comments above. No cystic structure found in the popliteal fossa.  *See table(s) above for measurements and observations. Electronically signed by Monica Martinez MD on 11/30/2018 at 4:36:12 PM.    Final     Microbiology: Recent Results (from the past 240 hour(s))  SARS Coronavirus 2 [facility] order, Performed in [facility] hospital lab) Nasopharyngeal Nasopharyngeal Swab     Status: None   Collection Time: 12/06/18  4:46 PM   Specimen: Nasopharyngeal Swab  Result Value Ref Range Status   SARS Coronavirus 2 NEGATIVE NEGATIVE Final    Comment: (NOTE) If result is NEGATIVE SARS-CoV-2 target nucleic acids are NOT DETECTED. The SARS-CoV-2 RNA is generally detectable in upper and lower  respiratory specimens during the acute phase of infection. The lowest  concentration of SARS-CoV-2 viral copies this assay can detect is 250  copies / mL. A negative result does not preclude SARS-CoV-2 infection  and should not be used as the sole basis for  treatment or other  patient management decisions.  A  negative result may occur with  improper specimen collection / handling, submission of specimen other  than nasopharyngeal swab, presence of viral mutation(s) within the  areas targeted by this assay, and inadequate number of viral copies  (<250 copies / mL). A negative result must be combined with clinical  observations, patient history, and epidemiological information. If result is POSITIVE SARS-CoV-2 target nucleic acids are DETECTED. The SARS-CoV-2 RNA is generally detectable in upper and lower  respiratory specimens dur ing the acute phase of infection.  Positive  results are indicative of active infection with SARS-CoV-2.  Clinical  correlation with patient history and other diagnostic information is  necessary to determine patient infection status.  Positive results do  not rule out bacterial infection or co-infection with other viruses. If result is PRESUMPTIVE POSTIVE SARS-CoV-2 nucleic acids MAY BE PRESENT.   A presumptive positive result was obtained on the submitted specimen  and confirmed on repeat testing.  While 2019 novel coronavirus  (SARS-CoV-2) nucleic acids may be present in the submitted sample  additional confirmatory testing may be necessary for epidemiological  and / or clinical management purposes  to differentiate between  SARS-CoV-2 and other Sarbecovirus currently known to infect humans.  If clinically indicated additional testing with an alternate test  methodology [phone]) is advised. The SARS-CoV-2 RNA is generally  detectable in upper and lower respiratory sp ecimens during the acute  phase of infection. The expected result is Negative. Fact Sheet for Patients:  StrictlyIdeas.no Fact Sheet for Healthcare Providers: BankingDealers.co.za This test is not yet approved or cleared by the [place] FDA and has been authorized for detection and/or diagnosis of SARS-CoV-2 by FDA under an Emergency Use  Authorization (EUA).  This EUA will remain in effect (meaning this test can be used) for the duration of the COVID-19 declaration under Section 564(b)(1) of the Act, 21 U.S.C. section 360bbb-3(b)(1), unless the authorization is terminated or revoked sooner. Performed at [facility], [place] [address] [place], [place] [address]   Culture, blood (routine x 2)     Status: None (Preliminary result)   Collection Time: 12/06/18  9:08 PM   Specimen: BLOOD  Result Value Ref Range Status   Specimen Description   Final    BLOOD RIGHT ANTECUBITAL Performed at [facility] [address] [place], [place] [address]    Special Requests   Final    BOTTLES DRAWN AEROBIC AND ANAEROBIC Blood Culture adequate volume Performed at [facility] [address], [place], [place] [address]    Culture   Final    NO GROWTH 4 DAYS Performed at [facility], [place] [address], [place], [place] [address]    Report Status PENDING  Incomplete  Culture, blood (routine x 2)     Status: None (Preliminary result)   Collection Time: 12/06/18  9:16 PM   Specimen: BLOOD RIGHT ARM  Result Value Ref Range Status   Specimen Description BLOOD RIGHT ARM  Final   Special Requests   Final    BOTTLES DRAWN AEROBIC ONLY Blood Culture adequate volume   Culture   Final    NO GROWTH 4 DAYS Performed at [facility], [place] [address] [place], [place] [address]    Report Status PENDING  Incomplete  MRSA PCR Screening     Status: None   Collection Time: 12/07/18  1:24 PM   Specimen: Nasal Mucosa; Nasopharyngeal  Result Value Ref Range Status   MRSA by PCR NEGATIVE NEGATIVE Final    Comment:        The GeneXpert MRSA Assay (FDA approved for NASAL specimens only), is one component of a comprehensive MRSA colonization surveillance program. It is not intended to diagnose MRSA infection nor to guide or monitor treatment for MRSA infections. Performed at  [facility], [place] [address], [place], [place] [address]      Labs: Basic Metabolic Panel: Recent Labs  Lab 12/07/18 0204 12/08/18 0203 12/08/18 1323 12/09/18 0543 12/10/18 0810  NA 140 147* 146* 143 138  K 4.2 3.8 3.5 3.4* 3.8  CL 95* 100 100 97* 94*  CO2 35* 36* 34* 33* 33*  GLUCOSE 107* 125* 143* 118* 113*  BUN 32* 32* 32* 30* 30*  CREATININE 0.99 1.00 1.08* 1.15* 1.17*  CALCIUM 10.9* 11.5* 11.8* 11.5* 11.1*  MG 2.2  --   --   --  2.5*  PHOS  --   --   --   --  3.6   Liver Function Tests: Recent Labs  Lab 12/07/18 0204 12/08/18 1323 12/09/18 0543  AST 22 27 29   ALT 19 22 21   ALKPHOS 101 103 105  BILITOT 0.4 0.5 0.5  PROT 6.4* 7.1 6.9  ALBUMIN 3.4* 3.8 3.8   No results for input(s): LIPASE, AMYLASE in the last 168 hours. No results for input(s): AMMONIA in the last 168 hours. CBC: Recent Labs  Lab 12/06/18 1514 12/07/18 0204 12/09/18 0543 12/11/18 0753  WBC 16.5* 14.2* 14.0* 16.7*  NEUTROABS 12.7*  --  10.0* 12.3*  HGB 13.3 12.9 13.8 14.4  HCT 46.9* 45.2 47.9* 47.6*  MCV 108.3* 107.4* 103.7* 103.0*  PLT 348 340 386 337   Cardiac Enzymes: No results for input(s): CKTOTAL, CKMB, CKMBINDEX, TROPONINI in the last 168 hours. BNP: BNP (last 3 results) Recent Labs    12/06/18 1514  BNP 82.5    ProBNP (last 3 results) No results for input(s): PROBNP in the last 8760 hours.  CBG: Recent Labs  Lab 12/07/18 1844  GLUCAP 110*       Signed:  Kayleen Memos, MD [facility] 12/11/2018, 4:58 PM

## 2018-12-11 NOTE — Progress Notes (Signed)
PT Cancellation Note  Patient Details Name: Jessica [name] MRN: [id number] DOB: 1951/05/01   Cancelled Treatment:    Reason Eval/Treat Not Completed: Fatigue/lethargy limiting ability to participate--RN requested PT check back another time. Nursing just assisted pt back to bed. Will check back as schedule allows.   Weston Anna, PT Acute Rehabilitation Services Pager: [phone] Office: [phone]

## 2018-12-11 NOTE — Consult Note (Signed)
   [facility] Inpatient Consult   12/11/2018  Jessica [name] 1951/01/30 [id number]  Patient chart has been reviewed for readmissions less than 30 days and for medium risk score, 21%, for unplanned readmissions.  Patient assessed for [facility] follow up needs.  Chart review reveals patient patient's primary care provider is not a member of [facility] network of physicians. [facility] CM will not follow.  Netta Cedars, MSN, [facility] Liaison Nurse Mobile Phone [phone]  Toll free office [phone]

## 2018-12-12 LAB — CULTURE, BLOOD (ROUTINE X 2)
Culture: NO GROWTH
Culture: NO GROWTH
Special Requests: ADEQUATE
Special Requests: ADEQUATE

## 2018-12-18 ENCOUNTER — Inpatient Hospital Stay: Payer: Medicare Other | Admitting: Primary Care

## 2018-12-19 DIAGNOSIS — M48061 Spinal stenosis, lumbar region without neurogenic claudication: Secondary | ICD-10-CM | POA: Diagnosis not present

## 2018-12-19 DIAGNOSIS — M150 Primary generalized (osteo)arthritis: Secondary | ICD-10-CM | POA: Diagnosis not present

## 2018-12-19 DIAGNOSIS — Z6841 Body Mass Index (BMI) 40.0 and over, adult: Secondary | ICD-10-CM | POA: Diagnosis not present

## 2018-12-19 DIAGNOSIS — J449 Chronic obstructive pulmonary disease, unspecified: Secondary | ICD-10-CM | POA: Diagnosis not present

## 2018-12-19 DIAGNOSIS — E039 Hypothyroidism, unspecified: Secondary | ICD-10-CM | POA: Diagnosis not present

## 2018-12-19 DIAGNOSIS — F419 Anxiety disorder, unspecified: Secondary | ICD-10-CM | POA: Diagnosis not present

## 2018-12-19 DIAGNOSIS — J9621 Acute and chronic respiratory failure with hypoxia: Secondary | ICD-10-CM | POA: Diagnosis not present

## 2018-12-19 DIAGNOSIS — Z9981 Dependence on supplemental oxygen: Secondary | ICD-10-CM | POA: Diagnosis not present

## 2018-12-19 DIAGNOSIS — G4733 Obstructive sleep apnea (adult) (pediatric): Secondary | ICD-10-CM | POA: Diagnosis not present

## 2018-12-19 DIAGNOSIS — J9622 Acute and chronic respiratory failure with hypercapnia: Secondary | ICD-10-CM | POA: Diagnosis not present

## 2018-12-19 DIAGNOSIS — I872 Venous insufficiency (chronic) (peripheral): Secondary | ICD-10-CM | POA: Diagnosis not present

## 2018-12-19 DIAGNOSIS — I5033 Acute on chronic diastolic (congestive) heart failure: Secondary | ICD-10-CM | POA: Diagnosis not present

## 2018-12-19 DIAGNOSIS — F319 Bipolar disorder, unspecified: Secondary | ICD-10-CM | POA: Diagnosis not present

## 2018-12-19 DIAGNOSIS — I110 Hypertensive heart disease with heart failure: Secondary | ICD-10-CM | POA: Diagnosis not present

## 2018-12-19 DIAGNOSIS — E785 Hyperlipidemia, unspecified: Secondary | ICD-10-CM | POA: Diagnosis not present

## 2018-12-19 DIAGNOSIS — M329 Systemic lupus erythematosus, unspecified: Secondary | ICD-10-CM | POA: Diagnosis not present

## 2018-12-20 ENCOUNTER — Ambulatory Visit: Payer: Medicare Other | Admitting: Psychiatry

## 2018-12-21 ENCOUNTER — Telehealth: Payer: Self-pay | Admitting: Primary Care

## 2018-12-21 NOTE — Telephone Encounter (Signed)
Called and spoke to pt. Pt is requesting a sooner follow up for her HFU. Appt changed from 9/1 to 8/21 with Derl Barrow, NP. Pt verbalized understanding and denied any further questions or concerns at this time.

## 2018-12-22 ENCOUNTER — Ambulatory Visit (INDEPENDENT_AMBULATORY_CARE_PROVIDER_SITE_OTHER): Payer: Medicare Other | Admitting: Primary Care

## 2018-12-22 ENCOUNTER — Encounter: Payer: Self-pay | Admitting: Primary Care

## 2018-12-22 ENCOUNTER — Other Ambulatory Visit: Payer: Self-pay

## 2018-12-22 DIAGNOSIS — J9611 Chronic respiratory failure with hypoxia: Secondary | ICD-10-CM | POA: Diagnosis not present

## 2018-12-22 DIAGNOSIS — I5033 Acute on chronic diastolic (congestive) heart failure: Secondary | ICD-10-CM | POA: Diagnosis not present

## 2018-12-22 DIAGNOSIS — R0602 Shortness of breath: Secondary | ICD-10-CM

## 2018-12-22 DIAGNOSIS — G4733 Obstructive sleep apnea (adult) (pediatric): Secondary | ICD-10-CM

## 2018-12-22 DIAGNOSIS — R0600 Dyspnea, unspecified: Secondary | ICD-10-CM | POA: Insufficient documentation

## 2018-12-22 NOTE — Assessment & Plan Note (Deleted)
-   Needs to reschedule PFTs

## 2018-12-22 NOTE — Progress Notes (Signed)
@Patient  ID: Jessica [name], female    DOB: 1950-08-18, 68 y.o.   MRN: [id number]  Chief Complaint  Patient presents with   Hospitalization Follow-up    Referring provider: Alroy Dust, L.Marlou Sa, MD  HPI: 68 year old female, never smoked.  Past medical history significant for obstructive sleep apnea on CPAP, obesity hypoventilation syndrome, chronic respiratory failure with hypoxia, diastolic congestive heart failure, lupus, bipolar disorder, uncontrolled hypertension, neuropathy.  Patient of Dr. Loanne Drilling, seen for initial consult on 11/22/2018 for shortness of breath.  NPSG in 2017 at [facility], A[facility] 133.  Compliant with CPAP wearing it most nights for greater than 4 hours.  Patient's oxygen saturation on room air at 88%, recommend 2 L of oxygen at rest and 4 L on exertion.  Ordered for pulmonary function test.   Admitted to the hospital in July for cellulitis and readmitted in August for hypoxia and lower extremity edema.  Hospital course 8/5-8/02/2019: Patient readmitted to the hospital after previous admission in late July for cellulitis left lower extremity.  She was admitted to the emergency room with lethargy, hypoxia and bilateral lower extremity edema.  PCO2 greater than 80 on ABGs with low pH requiring BiPAP. She was diuresed and was on BiPAP until 8/8.  IV Lasix changed to oral 40 mg daily.  Discharge weight 129 kg (from 145 kg on 7/22). Echocardiogram on 8/1 showed preserved left ventricular ejection fraction 60 to 65%.  Follow-up with cardiology outpatient.  12/22/2018 Patient presents today for hospital follow-up. Feels winded and seems to have more difficulty moving around. States that she is compliant with her CPAP.OSA managed by Dr. Dwyane Dee with [facility]. Using 2L oxygen, O2 93% on 2L today.She has not felt like she has needed to use her albuterol. Weight is up 10 lbs today from hospital discharge. Dry weight 284 lbs/129kg, today her weight is 294 lbs. Uses a motorized WC, asking about  getting a POC but unable to do qualifying walk. States that she will be receiving home PT. [employer] is her D[employer] company.    Allergies  Allergen Reactions   Ibuprofen Diarrhea, Nausea Only and Other (See Comments)    Extreme stomach pain  Makes her feel bad   Codeine     UNSPECIFIED REACTION    Nabumetone     UNSPECIFIED REACTION    Promethazine     UNSPECIFIED REACTION    Amoxicillin-Pot Clavulanate Nausea And Vomiting   Erythromycin Base Diarrhea   Promethazine-Codeine Other (See Comments)    "Makes her feel bad"     There is no immunization history on file for this patient.  Past Medical History:  Diagnosis Date   Arthritis    lower back, hands, knees   Bipolar disorder ([place])    Cellulitis and abscess of left leg 11/29/2018   Depression    Dyspnea    with exertion   GERD (gastroesophageal reflux disease)    patient unsure about this dx - no meds   Headache    History of IBS    watches diet   Hyperlipidemia    Hypertension    Hypothyroidism    Obese    Plantar fasciitis    Seasonal allergies    Sleep apnea 2017   uses CPAP    Spinal stenosis of lumbar region    SVD (spontaneous vaginal delivery)    x 2   Systemic lupus ([place])    Thyroid disease     Tobacco History: Social History   Tobacco Use  Smoking Status  Never Smoker  Smokeless Tobacco Never Used   Counseling given: Not Answered   Outpatient Medications Prior to Visit  Medication Sig Dispense Refill   Acetylcysteine (NAC) 600 MG CAPS Take 600 mg by mouth 2 (two) times daily.     albuterol (PROVENTIL) (2.5 MG/3ML) 0.083% nebulizer solution Take 3 mLs (2.5 mg total) by nebulization 2 (two) times daily as needed for wheezing or shortness of breath. 75 mL 0   albuterol (VENTOLIN HFA) 108 (90 Base) MCG/ACT inhaler Inhale 2 puffs into the lungs every 6 (six) hours as needed for wheezing or shortness of breath. 8 g 4   amLODipine (NORVASC) 10 MG tablet Take 1 tablet (10 mg  total) by mouth daily. 60 tablet 0   Carboxymethylcellul-Glycerin (REFRESH OPTIVE OP) Place 1-2 drops into both eyes 4 (four) times daily as needed (dry eyes).      clotrimazole-betamethasone (LOTRISONE) cream Apply 1 application topically 2 (two) times daily as needed (rash).     cycloSPORINE (RESTASIS) 0.05 % ophthalmic emulsion Place 1 drop into both eyes 2 (two) times daily.     DULoxetine (CYMBALTA) 60 MG capsule TAKE 1 CAPSULE BY MOUTH EVERY DAY IN THE MORNING (Patient taking differently: Take 60 mg by mouth daily. ) 90 capsule 0   econazole nitrate 1 % cream Apply 1 application topically 3 (three) times daily as needed (fungus).      fluticasone (FLONASE) 50 MCG/ACT nasal spray Place 1 spray into both nostrils daily.     furosemide (LASIX) 40 MG tablet Take 1 tablet (40 mg total) by mouth daily. 60 tablet 0   gabapentin (NEURONTIN) 300 MG capsule Take 1 capsule (300 mg total) by mouth at bedtime. 30 capsule 0   ketoconazole (NIZORAL) 2 % cream Apply 1 application topically 3 (three) times daily as needed for irritation.     levothyroxine (SYNTHROID) 25 MCG tablet Take 225 mcg by mouth daily before breakfast.      lisdexamfetamine (VYVANSE) 60 MG capsule Take 1 capsule (60 mg total) by mouth every morning. 30 capsule 0   lithium carbonate 150 MG capsule Take 5 capsules (750 mg total) by mouth at bedtime for 20 days. 100 capsule 0   loratadine (CLARITIN) 10 MG tablet Take 10 mg by mouth daily as needed for allergies.      LORazepam (ATIVAN) 0.5 MG tablet Take 1 tablet (0.5 mg total) by mouth 2 (two) times daily as needed for anxiety. 10 tablet 0   metoprolol tartrate (LOPRESSOR) 25 MG tablet Take 0.5 tablets (12.5 mg total) by mouth 2 (two) times daily. 60 tablet 0   montelukast (SINGULAIR) 10 MG tablet Take 1 tablet by mouth daily.     Multiple Vitamins-Minerals (MULTIVITAMIN WITH MINERALS) tablet Take 1 tablet by mouth daily.     Multiple Vitamins-Minerals (PRESERVISION  AREDS 2) CAPS Take 1 capsule 2 (two) times daily by mouth.     potassium chloride SA (K-DUR) 20 MEQ tablet Take 1 tablet (20 mEq total) by mouth daily. 30 tablet 0   pravastatin (PRAVACHOL) 20 MG tablet Take 20 mg at bedtime by mouth.      Thiamine HCl (VITAMIN B1) 100 MG TABS Take 1 tablet by mouth daily.     vitamin B-12 (CYANOCOBALAMIN) 100 MCG tablet Take 100 mcg by mouth 2 (two) times daily.     Vitamin D, Ergocalciferol, (DRISDOL) 1.25 MG (50000 UT) CAPS capsule Take 50,000 Units by mouth every 7 (seven) days.     No facility-administered medications prior to  visit.     Review of Systems  Review of Systems  Constitutional: Positive for fatigue.  Respiratory: Positive for shortness of breath. Negative for cough and wheezing.   Cardiovascular: Positive for leg swelling. Negative for chest pain.   Physical Exam  BP 130/82 (BP Location: Left Arm, Patient Position: Sitting)    Pulse 87    Temp 97.6 F (36.4 C)    Ht 5\' 2"  (1.575 m)    Wt 294 lb (133.4 kg)    SpO2 93%    BMI 53.77 kg/m  Physical Exam Constitutional:      Appearance: Normal appearance. She is obese.  Cardiovascular:     Rate and Rhythm: Normal rate and regular rhythm.  Pulmonary:     Effort: Pulmonary effort is normal.     Breath sounds: No wheezing or rhonchi.  Neurological:     General: No focal deficit present.     Mental Status: She is alert. Mental status is at baseline.  Psychiatric:        Mood and Affect: Mood normal.        Behavior: Behavior normal.      Lab Results:  CBC    Component Value Date/Time   WBC 16.7 (H) 12/11/2018 0753   RBC 4.62 12/11/2018 0753   HGB 14.4 12/11/2018 0753   HCT 47.6 (H) 12/11/2018 0753   PLT 337 12/11/2018 0753   MCV 103.0 (H) 12/11/2018 0753   MCH 31.2 12/11/2018 0753   MCHC 30.3 12/11/2018 0753   RDW 13.3 12/11/2018 0753   LYMPHSABS 2.0 12/11/2018 0753   MONOABS 1.7 (H) 12/11/2018 0753   EOSABS 0.5 12/11/2018 0753   BASOSABS 0.1 12/11/2018 0753     BMET    Component Value Date/Time   NA 138 12/10/2018 0810   K 3.8 12/10/2018 0810   CL 94 (L) 12/10/2018 0810   CO2 33 (H) 12/10/2018 0810   GLUCOSE 113 (H) 12/10/2018 0810   BUN 30 (H) 12/10/2018 0810   CREATININE 1.17 (H) 12/10/2018 0810   CREATININE 0.90 05/15/2018 1118   CALCIUM 11.1 (H) 12/10/2018 0810   GFRNONAA 48 (L) 12/10/2018 0810   GFRNONAA 66 05/15/2018 1118   GFRAA 55 (L) 12/10/2018 0810   GFRAA 76 05/15/2018 1118    BNP    Component Value Date/Time   BNP 82.5 12/06/2018 1514    ProBNP No results found for: PROBNP  Imaging: Dg Chest Port 1 View  Result Date: 12/09/2018 CLINICAL DATA:  Hypoxia and shortness of breath. Hypertension and bronchitis. EXAM: PORTABLE CHEST 1 VIEW COMPARISON:  12/06/2018 FINDINGS: Numerous leads and wires project over the chest. Midline trachea. Cardiomegaly accentuated by AP portable technique. No right and no definite left pleural effusion. No pneumothorax. The Chin overlies the apices. Given AP portable technique and low lung volumes, no residual pulmonary edema. Minimal thickening of the right minor fissure. Subsegmental atelectasis at both lung bases. IMPRESSION: Cardiomegaly with resolution of congestive heart failure. Electronically Signed   By: Abigail Miyamoto M.D.   On: 12/09/2018 13:59   Dg Chest Port 1 View  Result Date: 12/06/2018 CLINICAL DATA:  Shortness of breath. EXAM: PORTABLE CHEST 1 VIEW COMPARISON:  Chest x-rays dated 11/16/2018 and 04/14/2015 FINDINGS: Persistent cardiomegaly. Increased pulmonary vascular prominence particularly of the main pulmonary arteries. Increased density at the lung bases may represent small effusions or slight pulmonary edema. Haziness at the aortic arch probably represents edema or atelectasis. No acute bone abnormality. IMPRESSION: Findings consistent with congestive heart failure. Electronically  Signed   By: Lorriane Shire M.D.   On: 12/06/2018 16:15   Vas [place] Lower Extremity Venous  (dvt)  Result Date: 11/30/2018  Lower Venous Study Indications: Swelling, Erythema, and Pain.  Limitations: Body habitus, poor ultrasound/tissue interface and pannus inhibiting access to groin and thigh. Performing Technologist: Maudry Mayhew MHA, RDMS, RVT, RDCS  Examination Guidelines: A complete evaluation includes B-mode imaging, spectral Doppler, color Doppler, and power Doppler as needed of all accessible portions of each vessel. Bilateral testing is considered an integral part of a complete examination. Limited examinations for reoccurring indications may be performed as noted.  +-----+---------------+---------+-----------+----------+--------------+  RIGHT Compressibility Phasicity Spontaneity Properties Summary         +-----+---------------+---------+-----------+----------+--------------+  CFV                                                    Not visualized  +-----+---------------+---------+-----------+----------+--------------+   +---------+---------------+---------+-----------+----------+-------------------+  LEFT      Compressibility Phasicity Spontaneity Properties Summary              +---------+---------------+---------+-----------+----------+-------------------+  CFV                                                        Not visualized       +---------+---------------+---------+-----------+----------+-------------------+  SFJ                                                        Not visualized       +---------+---------------+---------+-----------+----------+-------------------+  FV Prox                                                    Not visualized       +---------+---------------+---------+-----------+----------+-------------------+  FV Mid                                                     Not visualized       +---------+---------------+---------+-----------+----------+-------------------+  FV Distal Full                                                                   +---------+---------------+---------+-----------+----------+-------------------+  PFV                                                        Not visualized       +---------+---------------+---------+-----------+----------+-------------------+  POP                       Yes       Yes                    Unable to obtain                                                                 adequate position                                                                to perform                                                                       compression. Patent                                                              by color Doppler.    +---------+---------------+---------+-----------+----------+-------------------+  PTV       Full                                             Limited                                                                          visualization        +---------+---------------+---------+-----------+----------+-------------------+  PERO                                                       Not visualized       +---------+---------------+---------+-----------+----------+-------------------+     Summary: Left: There is no evidence of deep vein thrombosis in the lower extremity. However, portions of this examination were limited- see technologist comments above. No cystic structure found in the popliteal fossa.  *See table(s) above for measurements and observations. Electronically signed by Monica Martinez MD on 11/30/2018 at 4:36:12 PM.    Final      Assessment & Plan:   OSA (obstructive sleep apnea) -  Reports compliance with CPAP - OSA managed by Dr. Dwyane Dee with [facility]  Chronic hypoxemic respiratory failure ([place]) - D/t CHF, OHS and OSA - Continue 2L oxygen at rest; 4L on exertion - PRN albuterol hfa/neb every 6 hours for shortness of breath/wheezing  - Needs to reschedule PFTs   Acute on chronic diastolic CHF (congestive heart failure) (HCC) - Weight is up 10 lbs from hospital  stay - Advised patient take an extra 20mg  daily x 5 days - Monitor daily weight and follow low sodium diet - Has apt with cardiology on 9/3    Martyn Ehrich, NP 12/22/2018

## 2018-12-22 NOTE — Patient Instructions (Addendum)
Hospital diagnosis was respiratory failure due to heart failure, obesity hypoventilation syndrome and obstructive sleep apnea   Orders: Needs to reschedule pulmonary function testing with Dr. Loanne Drilling  Chronic respiratory failure: Use 2L oxygen at rest; Increased to 4L on any exertion Continue albuterol every 6 hours for shortness of breath/wheezing   OSA: Continue CPAP every night   CHF: Monitor daily weight (dry weight 129 kg/ 284 lbs) Continue lasix 40mg  daily; take an extra 20mg  x 5 days  Follow low sodium diet  Follow up with cardiology  Follow up: 1-2 months with Dr. Loanne Drilling  Cardiology on 9/3 at 4:20  Contact your sleep doctor for a follow-up

## 2018-12-22 NOTE — Assessment & Plan Note (Signed)
-   Reports compliance with CPAP - OSA managed by Dr. Dwyane Dee with [facility]

## 2018-12-22 NOTE — Assessment & Plan Note (Addendum)
-   D/t CHF, OHS and OSA - Continue 2L oxygen at rest; 4L on exertion - PRN albuterol hfa/neb every 6 hours for shortness of breath/wheezing  - Needs to reschedule PFTs

## 2018-12-22 NOTE — Assessment & Plan Note (Addendum)
-   Weight is up 10 lbs from hospital stay - Advised patient take an extra 20mg  daily x 5 days - Monitor daily weight and follow low sodium diet - Has apt with cardiology on 9/3

## 2018-12-25 DIAGNOSIS — J9622 Acute and chronic respiratory failure with hypercapnia: Secondary | ICD-10-CM | POA: Diagnosis not present

## 2018-12-25 DIAGNOSIS — J449 Chronic obstructive pulmonary disease, unspecified: Secondary | ICD-10-CM | POA: Diagnosis not present

## 2018-12-25 DIAGNOSIS — I5033 Acute on chronic diastolic (congestive) heart failure: Secondary | ICD-10-CM | POA: Diagnosis not present

## 2018-12-25 DIAGNOSIS — J9621 Acute and chronic respiratory failure with hypoxia: Secondary | ICD-10-CM | POA: Diagnosis not present

## 2018-12-25 DIAGNOSIS — I110 Hypertensive heart disease with heart failure: Secondary | ICD-10-CM | POA: Diagnosis not present

## 2018-12-25 DIAGNOSIS — G4733 Obstructive sleep apnea (adult) (pediatric): Secondary | ICD-10-CM | POA: Diagnosis not present

## 2018-12-26 DIAGNOSIS — I110 Hypertensive heart disease with heart failure: Secondary | ICD-10-CM | POA: Diagnosis not present

## 2018-12-26 DIAGNOSIS — J9622 Acute and chronic respiratory failure with hypercapnia: Secondary | ICD-10-CM | POA: Diagnosis not present

## 2018-12-26 DIAGNOSIS — I5033 Acute on chronic diastolic (congestive) heart failure: Secondary | ICD-10-CM | POA: Diagnosis not present

## 2018-12-26 DIAGNOSIS — J9621 Acute and chronic respiratory failure with hypoxia: Secondary | ICD-10-CM | POA: Diagnosis not present

## 2018-12-26 DIAGNOSIS — J449 Chronic obstructive pulmonary disease, unspecified: Secondary | ICD-10-CM | POA: Diagnosis not present

## 2018-12-26 DIAGNOSIS — G4733 Obstructive sleep apnea (adult) (pediatric): Secondary | ICD-10-CM | POA: Diagnosis not present

## 2018-12-27 DIAGNOSIS — M545 Low back pain: Secondary | ICD-10-CM | POA: Diagnosis not present

## 2018-12-27 DIAGNOSIS — N3281 Overactive bladder: Secondary | ICD-10-CM | POA: Diagnosis not present

## 2018-12-27 DIAGNOSIS — M79671 Pain in right foot: Secondary | ICD-10-CM | POA: Diagnosis not present

## 2018-12-28 DIAGNOSIS — J9622 Acute and chronic respiratory failure with hypercapnia: Secondary | ICD-10-CM | POA: Diagnosis not present

## 2018-12-28 DIAGNOSIS — J449 Chronic obstructive pulmonary disease, unspecified: Secondary | ICD-10-CM | POA: Diagnosis not present

## 2018-12-28 DIAGNOSIS — J9621 Acute and chronic respiratory failure with hypoxia: Secondary | ICD-10-CM | POA: Diagnosis not present

## 2018-12-28 DIAGNOSIS — I5033 Acute on chronic diastolic (congestive) heart failure: Secondary | ICD-10-CM | POA: Diagnosis not present

## 2018-12-28 DIAGNOSIS — I110 Hypertensive heart disease with heart failure: Secondary | ICD-10-CM | POA: Diagnosis not present

## 2018-12-28 DIAGNOSIS — G4733 Obstructive sleep apnea (adult) (pediatric): Secondary | ICD-10-CM | POA: Diagnosis not present

## 2018-12-29 ENCOUNTER — Other Ambulatory Visit: Payer: Self-pay

## 2018-12-29 ENCOUNTER — Ambulatory Visit (INDEPENDENT_AMBULATORY_CARE_PROVIDER_SITE_OTHER): Payer: Medicare Other | Admitting: Psychiatry

## 2018-12-29 ENCOUNTER — Encounter: Payer: Self-pay | Admitting: Psychiatry

## 2018-12-29 VITALS — BP 127/72 | HR 86

## 2018-12-29 DIAGNOSIS — F900 Attention-deficit hyperactivity disorder, predominantly inattentive type: Secondary | ICD-10-CM | POA: Diagnosis not present

## 2018-12-29 DIAGNOSIS — F319 Bipolar disorder, unspecified: Secondary | ICD-10-CM

## 2018-12-29 DIAGNOSIS — F5081 Binge eating disorder: Secondary | ICD-10-CM

## 2018-12-29 DIAGNOSIS — F411 Generalized anxiety disorder: Secondary | ICD-10-CM | POA: Diagnosis not present

## 2018-12-29 MED ORDER — SERTRALINE HCL 50 MG PO TABS: 50.0000 mg | ORAL_TABLET | Freq: Every day | ORAL | 1 refills | Status: DC

## 2018-12-29 MED ORDER — DULOXETINE HCL 30 MG PO CPEP: 30.0000 mg | ORAL_CAPSULE | Freq: Every day | ORAL | 3 refills | Status: DC

## 2018-12-29 NOTE — Patient Instructions (Signed)
Now reduce lithium to 4 of the 150 mg capsules because lithium level is at risk of rising with the Lasix.  After returning from trip: Reduce duloxetine to 30 mg (new Rx) with 1/2 of sertraline 50 mg tablet for 1 week. Then stop duloxetine and increase sertraline to 1 tablet each morning. The purpose of the change is to reduce stress feelings and irritability.  Call if any worsening depression.

## 2018-12-29 NOTE — Progress Notes (Signed)
Jessica [name] [id number] [birth year] 68 y.o.  Subjective:   Patient ID:  Jessica [name] is a 68 y.o. (DOB [birth year]) female.  Chief Complaint:  Chief Complaint  Patient presents with  . Eating Disorder    Medication management  . Other    Bipolar 1    HPI  Jessica [name] presents to the office today for follow-up of bipolar disorder.  Last seen April 2020.Marland Kitchen No meds changed.\  Seen with Coral Ceo today.  2 hospitalizations. 1 for cellulitis and then quick rehospitalization. Including CHF and respiratory failure.  Now on 02. Very limiting.   Feels less SOB with the O2.  Glad to be alive.  No SI.    H notes she flies off the handle more easily than in the past and is more impatient and critical.  She's never been a real patient person.  Gradually worse.  Has a lot of demands and needs. She admits and says it's no prolonged but brief.  Not manic.  Has trouble letting things go.  Sleep excessively but Lasix making her urinate more.  Low motivation.  Noticeable since before Xmas.  Struggles with productivity.  Couldn't help much with meals or anything at [facility] and sleepiness.  Chronic conflict with [name] and sent her a nasty note to that effect,; caused stresss.  Seeing sleep doctor.  Has had a spell of irritability for days.  1 day of death thoughts.  Had missed some meds at the time may have been the cause.  Can be hard to keep up with meds at times. Asked questions about that.  Current depression not severe.  Sleep poor with EMA.  Lies in bed a lot.  Is using CPAP.  Her doctor at [facility] says it's the worst case she's ever seen.  Not driving per the sleep doctor.   Naps and broken sleep.  Gabapentin helps bladder pain. No concerns about meds.  Past Psychiatric Medication Trials: Abilify, Seroquel 600, Depakote, Trileptal, olanzapine,, ziprasidone, venlafaxine, lamotrigine 200, Ritalin, Lunesta, lithium, lorazepam, Nuvigil, Vyvanse, Ritalin, citalopram,  Wellbutrin,  Ambien,  and others  Review of Systems:  Review of Systems  Constitutional: Positive for fatigue.  Respiratory: Positive for shortness of breath.   Genitourinary: Positive for frequency, pelvic pain and urgency.  Musculoskeletal: Positive for arthralgias, back pain and gait problem.       Uses walker  Neurological: Positive for weakness. Negative for tremors.  Psychiatric/Behavioral: Positive for dysphoric mood. Negative for agitation, behavioral problems, confusion, decreased concentration, hallucinations, self-injury, sleep disturbance and suicidal ideas. The patient is not nervous/anxious and is not hyperactive.     Medications: I have reviewed the patient's current medications.  Current Outpatient Medications  Medication Sig Dispense Refill  . Acetylcysteine (NAC) 600 MG CAPS Take 600 mg by mouth 2 (two) times daily.    Marland Kitchen albuterol (PROVENTIL) (2.5 MG/3ML) 0.083% nebulizer solution Take 3 mLs (2.5 mg total) by nebulization 2 (two) times daily as needed for wheezing or shortness of breath. 75 mL 0  . albuterol (VENTOLIN HFA) 108 (90 Base) MCG/ACT inhaler Inhale 2 puffs into the lungs every 6 (six) hours as needed for wheezing or shortness of breath. 8 g 4  . amLODipine (NORVASC) 10 MG tablet Take 1 tablet (10 mg total) by mouth daily. 60 tablet 0  . Carboxymethylcellul-Glycerin (REFRESH OPTIVE OP) Place 1-2 drops into both eyes 4 (four) times daily as needed (dry eyes).     . clotrimazole-betamethasone (LOTRISONE) cream Apply 1  application topically 2 (two) times daily as needed (rash).    . cycloSPORINE (RESTASIS) 0.05 % ophthalmic emulsion Place 1 drop into both eyes 2 (two) times daily.    . DULoxetine (CYMBALTA) 60 MG capsule TAKE 1 CAPSULE BY MOUTH EVERY DAY IN THE MORNING (Patient taking differently: Take 60 mg by mouth daily. ) 90 capsule 0  . econazole nitrate 1 % cream Apply 1 application topically 3 (three) times daily as needed (fungus).     . fluticasone  (FLONASE) 50 MCG/ACT nasal spray Place 1 spray into both nostrils daily.    . furosemide (LASIX) 40 MG tablet Take 1 tablet (40 mg total) by mouth daily. 60 tablet 0  . gabapentin (NEURONTIN) 300 MG capsule Take 1 capsule (300 mg total) by mouth at bedtime. 30 capsule 0  . ketoconazole (NIZORAL) 2 % cream Apply 1 application topically 3 (three) times daily as needed for irritation.    Marland Kitchen levothyroxine (SYNTHROID) 25 MCG tablet Take 225 mcg by mouth daily before breakfast.     . lisdexamfetamine (VYVANSE) 60 MG capsule Take 1 capsule (60 mg total) by mouth every morning. 30 capsule 0  . lithium carbonate 150 MG capsule Take 5 capsules (750 mg total) by mouth at bedtime for 20 days. 100 capsule 0  . loratadine (CLARITIN) 10 MG tablet Take 10 mg by mouth daily as needed for allergies.     Marland Kitchen LORazepam (ATIVAN) 0.5 MG tablet Take 1 tablet (0.5 mg total) by mouth 2 (two) times daily as needed for anxiety. 10 tablet 0  . metoprolol tartrate (LOPRESSOR) 25 MG tablet Take 0.5 tablets (12.5 mg total) by mouth 2 (two) times daily. 60 tablet 0  . montelukast (SINGULAIR) 10 MG tablet Take 1 tablet by mouth daily.    . Multiple Vitamins-Minerals (MULTIVITAMIN WITH MINERALS) tablet Take 1 tablet by mouth daily.    . Multiple Vitamins-Minerals (PRESERVISION AREDS 2) CAPS Take 1 capsule 2 (two) times daily by mouth.    . potassium chloride SA (K-DUR) 20 MEQ tablet Take 1 tablet (20 mEq total) by mouth daily. 30 tablet 0  . pravastatin (PRAVACHOL) 20 MG tablet Take 20 mg at bedtime by mouth.     . Thiamine HCl (VITAMIN B1) 100 MG TABS Take 1 tablet by mouth daily.    . vitamin B-12 (CYANOCOBALAMIN) 100 MCG tablet Take 100 mcg by mouth 2 (two) times daily.    . Vitamin D, Ergocalciferol, (DRISDOL) 1.25 MG (50000 UT) CAPS capsule Take 50,000 Units by mouth every 7 (seven) days.     No current facility-administered medications for this visit.     Medication Side Effects: Other: ? sleepiness unlikely  related.  Allergies:  Allergies  Allergen Reactions  . Ibuprofen Diarrhea, Nausea Only and Other (See Comments)    Extreme stomach pain  Makes her feel bad  . Codeine     UNSPECIFIED REACTION   . Nabumetone     UNSPECIFIED REACTION   . Promethazine     UNSPECIFIED REACTION   . Amoxicillin-Pot Clavulanate Nausea And Vomiting  . Erythromycin Base Diarrhea  . Promethazine-Codeine Other (See Comments)    "Makes her feel bad"    Past Medical History:  Diagnosis Date  . Arthritis    lower back, hands, knees  . Bipolar disorder ([place])   . Cellulitis and abscess of left leg 11/29/2018  . Depression   . Dyspnea    with exertion  . GERD (gastroesophageal reflux disease)    patient unsure about  this dx - no meds  . Headache   . History of IBS    watches diet  . Hyperlipidemia   . Hypertension   . Hypothyroidism   . Obese   . Plantar fasciitis   . Seasonal allergies   . Sleep apnea 2017   uses CPAP   . Spinal stenosis of lumbar region   . SVD (spontaneous vaginal delivery)    x 2  . Systemic lupus ([place])   . Thyroid disease     Family History  Problem Relation Age of Onset  . Thyroid disease Mother   . Breast cancer Mother   . Heart disease Mother   . Bipolar disorder Father   . Diabetes Father   . Heart disease Father   . Dementia Father     Social History   Socioeconomic History  . Marital status: Married    Spouse name: Not on file  . Number of children: Not on file  . Years of education: Not on file  . Highest education level: Not on file  Occupational History  . Not on file  Social Needs  . Financial resource strain: Not on file  . Food insecurity    Worry: Not on file    Inability: Not on file  . Transportation needs    Medical: Not on file    Non-medical: Not on file  Tobacco Use  . Smoking status: Never Smoker  . Smokeless tobacco: Never Used  Substance and Sexual Activity  . Alcohol use: Yes    Comment: rarely  . Drug use: Never  .  Sexual activity: Not on file  Lifestyle  . Physical activity    Days per week: Not on file    Minutes per session: Not on file  . Stress: Not on file  Relationships  . Social Herbalist on phone: Not on file    Gets together: Not on file    Attends religious service: Not on file    Active member of club or organization: Not on file    Attends meetings of clubs or organizations: Not on file    Relationship status: Not on file  . Intimate partner violence    Fear of current or ex partner: Not on file    Emotionally abused: Not on file    Physically abused: Not on file    Forced sexual activity: Not on file  Other Topics Concern  . Not on file  Social History Narrative   She lives in a single level home with her husband.  She has two grown children.   She taught college accounting courses, retired in 2005.    Highest level of education:  Doctorate in accounting    Past Medical History, Surgical history, Social history, and Family history were reviewed and updated as appropriate.   5 gkids 15 to 7 mos.  Please see review of systems for further details on the patient's review from today.   Objective:   Physical Exam:  There were no vitals taken for this visit.  Physical Exam Constitutional:      General: She is not in acute distress.    Appearance: She is well-developed. She is obese.  Musculoskeletal:        General: No deformity.  Neurological:     Mental Status: She is alert and oriented to person, place, and time.     Cranial Nerves: No dysarthria.     Motor: Weakness present.  Coordination: Coordination abnormal.     Gait: Gait abnormal.  Psychiatric:        Attention and Perception: Attention and perception normal. She does not perceive auditory or visual hallucinations.        Mood and Affect: Mood is anxious. Mood is not depressed or elated. Affect is not labile, blunt, angry, tearful or inappropriate.        Speech: Speech is not rapid and  pressured or slurred.        Behavior: Behavior normal. Behavior is cooperative.        Thought Content: Thought content normal. Thought content is not paranoid or delusional. Thought content does not include homicidal or suicidal ideation. Thought content does not include homicidal or suicidal plan.        Cognition and Memory: Cognition and memory normal.        Judgment: Judgment normal.     Comments: Insight fair. Tendency to irritability apparently worse. Talkative.and speech clearer.     Lab Review:     Component Value Date/Time   NA 138 12/10/2018 0810   K 3.8 12/10/2018 0810   CL 94 (L) 12/10/2018 0810   CO2 33 (H) 12/10/2018 0810   GLUCOSE 113 (H) 12/10/2018 0810   BUN 30 (H) 12/10/2018 0810   CREATININE 1.17 (H) 12/10/2018 0810   CREATININE 0.90 05/15/2018 1118   CALCIUM 11.1 (H) 12/10/2018 0810   PROT 6.9 12/09/2018 0543   ALBUMIN 3.8 12/09/2018 0543   AST 29 12/09/2018 0543   ALT 21 12/09/2018 0543   ALKPHOS 105 12/09/2018 0543   BILITOT 0.5 12/09/2018 0543   GFRNONAA 48 (L) 12/10/2018 0810   GFRNONAA 66 05/15/2018 1118   GFRAA 55 (L) 12/10/2018 0810   GFRAA 76 05/15/2018 1118       Component Value Date/Time   WBC 16.7 (H) 12/11/2018 0753   RBC 4.62 12/11/2018 0753   HGB 14.4 12/11/2018 0753   HCT 47.6 (H) 12/11/2018 0753   PLT 337 12/11/2018 0753   MCV 103.0 (H) 12/11/2018 0753   MCH 31.2 12/11/2018 0753   MCHC 30.3 12/11/2018 0753   RDW 13.3 12/11/2018 0753   LYMPHSABS 2.0 12/11/2018 0753   MONOABS 1.7 (H) 12/11/2018 0753   EOSABS 0.5 12/11/2018 0753   BASOSABS 0.1 12/11/2018 0753    Lithium Lvl  Date Value Ref Range Status  12/06/2018 1.14 0.60 - 1.20 mmol/L Final    Comment:    Performed at [facility], [place] [address], [place], [place] [address]    Lithium 1.0 on 04/06/18 and other labs that date.   No results found for: PHENYTOIN, PHENOBARB, VALPROATE, CBMZ   .res Assessment: Plan:    Bipolar I disorder  ([place])  Attention deficit hyperactivity disorder (ADHD), predominantly inattentive type  Generalized anxiety disorder  Binge-eating disorder, severe  Very severe.  Mild Cognitive Impairment stable and maybe better with O2  Emphasized the importance of CPAP as she struggels with the treatment.  Talked about the effect of OSA on the brain.    Lithium level is good.  BMP stable except watch hypercalcemia.  She has not done adequately well with alternative mood stabilizers.  Disc lab tests.  Disc need to monitor hypercalcemia.  Counseled patient regarding potential benefits, risks, and side effects of lithium to include potential risk of lithium affecting thyroid and renal function.  Discussed need for periodic lab monitoring to determine drug level and to assess for potential adverse effects.  Counseled patient regarding signs  and symptoms of lithium toxicity and advised that they notify office immediately or seek urgent medical attention if experiencing these signs and symptoms.  Patient advised to contact office with any questions or concerns.  Lithium level OK generally at 1.14.  However bc dx CHF and fluid pills  We will reduce the lithium to reduce the risk of toxicity from 750 to 600 mg daily. She is having no current symptoms of lithium toxicity.  Disc irritability that she acknowledges.  Consider switching duloxetine to sertraline which tends to help irritability more.  Alternatives include atypicals but bc severe life threatening Obesity will not add those right now.  Disc risk of mania.    After returning from trip: Reduce duloxetine to 30 mg (new Rx) with 1/2 of sertraline 50 mg tablet for 1 week. Then stop duloxetine and increase sertraline to 1 tablet each morning. The purpose of the change is to reduce stress feelings and irritability. Disc risk triggering mania.  Will try to reduce risk with low dosage. Call if any worsening depression.  Disc stress of D in law and chronic health  problems and loss of driving independence. And Covid. And lack of mobility.    Discussed potential benefits, risks, and side effects of stimulants with patient to include increased heart rate, palpitations, insomnia, increased anxiety, increased irritability, or decreased appetite.  Instructed patient to contact office if experiencing any significant tolerability issues.  This appt was 30 mins.   FU 3 mos  Lynder Parents, MD, DFAPA   Please see After Visit Summary for patient specific instructions.  Future Appointments  Date Time Provider [place]  01/04/2019  4:20 PM Geralynn Rile, MD [facility]  01/25/2019  4:00 PM Cottle, Billey Co., MD [facility] None  05/22/2019  1:15 PM Bo Merino, MD [facility] None    No orders of the defined types were placed in this encounter.     -------------------------------

## 2019-01-01 DIAGNOSIS — J449 Chronic obstructive pulmonary disease, unspecified: Secondary | ICD-10-CM | POA: Diagnosis not present

## 2019-01-01 DIAGNOSIS — I110 Hypertensive heart disease with heart failure: Secondary | ICD-10-CM | POA: Diagnosis not present

## 2019-01-01 DIAGNOSIS — J9622 Acute and chronic respiratory failure with hypercapnia: Secondary | ICD-10-CM | POA: Diagnosis not present

## 2019-01-01 DIAGNOSIS — G4733 Obstructive sleep apnea (adult) (pediatric): Secondary | ICD-10-CM | POA: Diagnosis not present

## 2019-01-01 DIAGNOSIS — I5033 Acute on chronic diastolic (congestive) heart failure: Secondary | ICD-10-CM | POA: Diagnosis not present

## 2019-01-01 DIAGNOSIS — J9621 Acute and chronic respiratory failure with hypoxia: Secondary | ICD-10-CM | POA: Diagnosis not present

## 2019-01-02 ENCOUNTER — Inpatient Hospital Stay: Payer: Medicare Other | Admitting: Primary Care

## 2019-01-03 ENCOUNTER — Telehealth: Payer: Self-pay | Admitting: Psychiatry

## 2019-01-03 ENCOUNTER — Telehealth: Payer: Self-pay | Admitting: Pulmonary Disease

## 2019-01-03 ENCOUNTER — Other Ambulatory Visit: Payer: Self-pay

## 2019-01-03 DIAGNOSIS — J9621 Acute and chronic respiratory failure with hypoxia: Secondary | ICD-10-CM | POA: Diagnosis not present

## 2019-01-03 DIAGNOSIS — J9622 Acute and chronic respiratory failure with hypercapnia: Secondary | ICD-10-CM | POA: Diagnosis not present

## 2019-01-03 DIAGNOSIS — I5033 Acute on chronic diastolic (congestive) heart failure: Secondary | ICD-10-CM | POA: Diagnosis not present

## 2019-01-03 DIAGNOSIS — J449 Chronic obstructive pulmonary disease, unspecified: Secondary | ICD-10-CM | POA: Diagnosis not present

## 2019-01-03 DIAGNOSIS — G4733 Obstructive sleep apnea (adult) (pediatric): Secondary | ICD-10-CM | POA: Diagnosis not present

## 2019-01-03 DIAGNOSIS — F900 Attention-deficit hyperactivity disorder, predominantly inattentive type: Secondary | ICD-10-CM

## 2019-01-03 DIAGNOSIS — I110 Hypertensive heart disease with heart failure: Secondary | ICD-10-CM | POA: Diagnosis not present

## 2019-01-03 MED ORDER — LISDEXAMFETAMINE DIMESYLATE 60 MG PO CAPS: 60.0000 mg | ORAL_CAPSULE | ORAL | 0 refills | Status: DC

## 2019-01-03 NOTE — Telephone Encounter (Signed)
Pended for approval, last refill 11/29/2018

## 2019-01-03 NOTE — Telephone Encounter (Signed)
Attempted to call patient, no answer, left message to call back.  

## 2019-01-03 NOTE — Telephone Encounter (Signed)
Vyvanse Refill [facility] for [facility]. Ok to fill right away. [facility] [address]

## 2019-01-04 ENCOUNTER — Ambulatory Visit (INDEPENDENT_AMBULATORY_CARE_PROVIDER_SITE_OTHER): Payer: Medicare Other | Admitting: Cardiovascular Disease

## 2019-01-04 ENCOUNTER — Other Ambulatory Visit: Payer: Self-pay

## 2019-01-04 ENCOUNTER — Encounter: Payer: Self-pay | Admitting: Cardiovascular Disease

## 2019-01-04 ENCOUNTER — Telehealth: Payer: Self-pay | Admitting: Psychiatry

## 2019-01-04 VITALS — BP 119/69 | HR 88 | Temp 97.1°F | Ht 62.0 in | Wt 324.0 lb

## 2019-01-04 DIAGNOSIS — I5032 Chronic diastolic (congestive) heart failure: Secondary | ICD-10-CM | POA: Diagnosis not present

## 2019-01-04 DIAGNOSIS — R0602 Shortness of breath: Secondary | ICD-10-CM | POA: Diagnosis not present

## 2019-01-04 DIAGNOSIS — I50810 Right heart failure, unspecified: Secondary | ICD-10-CM | POA: Diagnosis not present

## 2019-01-04 NOTE — Progress Notes (Signed)
Cardiology Office Note:   Date:  01/04/2019  NAME:  Jessica [name]    MRN: [id number] DOB:  10-02-1950   PCP:  Alroy Dust, L.Marlou Sa, MD  Cardiologist:  No primary care provider on file.  Electrophysiologist:  None   Referring MD: Alroy Dust, L.Marlou Sa, MD   Chief Complaint  Patient presents with   Shortness of Breath   History of Present Illness:   Jessica [name] is a 68 y.o. female with a hx of morbid obesity, obesity hypoventilation syndrome, OSA hypertension, overactive bladder, hypothyroidism, depression who is being seen today for the evaluation of shortness of breath at the request of Alroy Dust, L.Marlou Sa, MD.  She presents after being hospitalized earlier this month for hypoxic respiratory failure secondary to obesity hypoventilation syndrome as well as likely heart failure with preserved ejection fraction.  She reports she is unclear of what her heart condition is and inquires on this today.  She apparently has recently been diagnosed with obesity hypoventilation syndrome, and requires 24-hour oxygen therapy.  She was diagnosed with sleep apnea 5 to 6 years ago and is recently become compliant with this.  Review of her echocardiogram shows normal LV function with diastolic impaired relaxation.  She did have mildly reduced RV function with a mildly dilated RV.  She reports since being home she is gained nearly 20 pounds in fluid.  Her diet seems to be the problem, she eats a [place] restaurants and buffets 3 times per week.  She is taking Lasix 40 mg daily and she reports is not helping her.  She also has evidence of chronic venous insufficiency on examination today.  She likely is developing lymphedema.  Regarding activity level she is mainly in a chair all day.  She reports she can get around her house with a walker, but if she leaves her house she requires assistance with a electric mobility device.  She reports she is extremely short of breath with minimal activity, but denies any chest pressure  or chest pain.  She does get tightness in her chest at times when she has trouble breathing.  Past Medical History: Past Medical History:  Diagnosis Date   Arthritis    lower back, hands, knees   Bipolar disorder ([place])    Cellulitis and abscess of left leg 11/29/2018   Depression    Dyspnea    with exertion   GERD (gastroesophageal reflux disease)    patient unsure about this dx - no meds   Headache    History of IBS    watches diet   Hyperlipidemia    Hypertension    Hypothyroidism    Obese    Plantar fasciitis    Seasonal allergies    Sleep apnea 2017   uses CPAP    Spinal stenosis of lumbar region    SVD (spontaneous vaginal delivery)    x 2   Systemic lupus ([place])    Thyroid disease     Past Surgical History: Past Surgical History:  Procedure Laterality Date   CARPAL TUNNEL RELEASE Left 09/16/2017   Procedure: LEFT CARPAL TUNNEL RELEASE;  Surgeon: Daryll Brod, MD;  Location: [facility];  Service: Orthopedics;  Laterality: Left;   carpel tunnel surgery Right    CATARACT EXTRACTION Bilateral 2007   w/ lens implants   COLONOSCOPY     DILATION AND CURETTAGE OF UTERUS     EYE SURGERY     FOOT SURGERY Right    hammer toe   FRACTURE SURGERY  R tibia and fibula   LEG SURGERY Right    TONSILLECTOMY     WISDOM TOOTH EXTRACTION      Current Medications: Current Meds  Medication Sig   Acetylcysteine (NAC) 600 MG CAPS Take 600 mg by mouth 2 (two) times daily.   albuterol (PROVENTIL) (2.5 MG/3ML) 0.083% nebulizer solution Take 3 mLs (2.5 mg total) by nebulization 2 (two) times daily as needed for wheezing or shortness of breath.   albuterol (VENTOLIN HFA) 108 (90 Base) MCG/ACT inhaler Inhale 2 puffs into the lungs every 6 (six) hours as needed for wheezing or shortness of breath.   amLODipine (NORVASC) 10 MG tablet Take 1 tablet (10 mg total) by mouth daily.   Carboxymethylcellul-Glycerin (REFRESH OPTIVE OP) Place 1-2 drops into both  eyes 4 (four) times daily as needed (dry eyes).    clotrimazole-betamethasone (LOTRISONE) cream Apply 1 application topically 2 (two) times daily as needed (rash).   cycloSPORINE (RESTASIS) 0.05 % ophthalmic emulsion Place 1 drop into both eyes 2 (two) times daily.   DULoxetine (CYMBALTA) 30 MG capsule Take 1 capsule (30 mg total) by mouth daily.   econazole nitrate 1 % cream Apply 1 application topically 3 (three) times daily as needed (fungus).    fluticasone (FLONASE) 50 MCG/ACT nasal spray Place 1 spray into both nostrils daily.   furosemide (LASIX) 40 MG tablet Take 1 tablet (40 mg total) by mouth daily.   gabapentin (NEURONTIN) 300 MG capsule Take 1 capsule (300 mg total) by mouth at bedtime.   ketoconazole (NIZORAL) 2 % cream Apply 1 application topically 3 (three) times daily as needed for irritation.   levothyroxine (SYNTHROID) 25 MCG tablet Take 225 mcg by mouth daily before breakfast.    lisdexamfetamine (VYVANSE) 60 MG capsule Take 1 capsule (60 mg total) by mouth every morning.   [START ON 01/31/2019] lisdexamfetamine (VYVANSE) 60 MG capsule Take 1 capsule (60 mg total) by mouth every morning.   [START ON 02/28/2019] lisdexamfetamine (VYVANSE) 60 MG capsule Take 1 capsule (60 mg total) by mouth every morning.   lithium carbonate 150 MG capsule Take 5 capsules (750 mg total) by mouth at bedtime for 20 days.   loratadine (CLARITIN) 10 MG tablet Take 10 mg by mouth daily as needed for allergies.    LORazepam (ATIVAN) 0.5 MG tablet Take 1 tablet (0.5 mg total) by mouth 2 (two) times daily as needed for anxiety.   metoprolol tartrate (LOPRESSOR) 25 MG tablet Take 0.5 tablets (12.5 mg total) by mouth 2 (two) times daily.   montelukast (SINGULAIR) 10 MG tablet Take 1 tablet by mouth daily.   Multiple Vitamins-Minerals (MULTIVITAMIN WITH MINERALS) tablet Take 1 tablet by mouth daily.   Multiple Vitamins-Minerals (PRESERVISION AREDS 2) CAPS Take 1 capsule 2 (two) times daily  by mouth.   potassium chloride SA (K-DUR) 20 MEQ tablet Take 1 tablet (20 mEq total) by mouth daily.   pravastatin (PRAVACHOL) 20 MG tablet Take 20 mg at bedtime by mouth.    sertraline (ZOLOFT) 50 MG tablet Take 1 tablet (50 mg total) by mouth daily.   Thiamine HCl (VITAMIN B1) 100 MG TABS Take 1 tablet by mouth daily.   vitamin B-12 (CYANOCOBALAMIN) 100 MCG tablet Take 100 mcg by mouth 2 (two) times daily.   Vitamin D, Ergocalciferol, (DRISDOL) 1.25 MG (50000 UT) CAPS capsule Take 50,000 Units by mouth every 7 (seven) days.     Allergies:    Ibuprofen, Codeine, Nabumetone, Promethazine, Amoxicillin-pot clavulanate, Erythromycin base, and Promethazine-codeine  Social History: Social History   Socioeconomic History   Marital status: Married    Spouse name: Not on file   Number of children: Not on file   Years of education: Not on file   Highest education level: Not on file  Occupational History   Not on file  Social Needs   Financial resource strain: Not on file   Food insecurity    Worry: Not on file    Inability: Not on file   Transportation needs    Medical: Not on file    Non-medical: Not on file  Tobacco Use   Smoking status: Never Smoker   Smokeless tobacco: Never Used  Substance and Sexual Activity   Alcohol use: Yes    Comment: rarely   Drug use: Never   Sexual activity: Not on file  Lifestyle   Physical activity    Days per week: Not on file    Minutes per session: Not on file   Stress: Not on file  Relationships   Social connections    Talks on phone: Not on file    Gets together: Not on file    Attends religious service: Not on file    Active member of club or organization: Not on file    Attends meetings of clubs or organizations: Not on file    Relationship status: Not on file  Other Topics Concern   Not on file  Social History Narrative   She lives in a single level home with her husband.  She has two grown children.   She  taught college accounting courses, retired in 2005.    Highest level of education:  Doctorate in accounting     Family History: The patient's family history includes Bipolar disorder in her father; Breast cancer in her mother; Dementia in her father; Diabetes in her father; Heart disease in her father and mother; Thyroid disease in her mother.  ROS:   All other ROS reviewed and negative. Pertinent positives noted in the HPI.     EKGs/Labs/Other Studies Reviewed:   The following studies were personally reviewed by me today:   TTE 12/02/2018  1. The left ventricle has normal systolic function with an ejection fraction of 60-65%. The cavity size was normal. There is mild concentric left ventricular hypertrophy. Left ventricular diastolic Doppler parameters are consistent with impaired  relaxation. Indeterminate filling pressures.  2. The right ventricle has mildly reduced systolic function. The cavity was mildly enlarged. There is no increase in right ventricular wall thickness.  3. The aortic valve is tricuspid. Moderate aortic annular calcification noted.  4. The mitral valve is grossly normal. There is mild mitral annular calcification present.  5. The tricuspid valve is grossly normal.  6. The aorta is normal in size and structure.  LE duplex 11/30/2018 Left: There is no evidence of deep vein thrombosis in the lower extremity. However, portions of this examination were limited- see technologist comments above. No cystic structure found in the popliteal fossa.  EKG:  EKG is not ordered today.  EKG dated 12/07/2018 was reviewed in office from her visit to the emergency room.  That EKG demonstrates normal sinus rhythm with heart rate of 99, no acute ST-T changes to suggest ischemia, no prior infarction, low voltage noted  Recent Labs: 12/06/2018: B Natriuretic Peptide 82.5 12/09/2018: ALT 21 12/10/2018: BUN 30; Creatinine, Ser 1.17; Magnesium 2.5; Potassium 3.8; Sodium 138 12/11/2018: Hemoglobin  14.4; Platelets 337   Recent Lipid Panel    Component  Value Date/Time   CHOL 173 01/23/2013 1105   TRIG 271 (H) 01/23/2013 1105   HDL 51 01/23/2013 1105   CHOLHDL 3.4 01/23/2013 1105   VLDL 54 (H) 01/23/2013 1105   LDLCALC 68 01/23/2013 1105    Physical Exam:   VS:  BP 119/69    Pulse 88    Temp (!) 97.1 F (36.2 C)    Ht 5\' 2"  (1.575 m)    Wt (!) 324 lb (147 kg)    SpO2 95%    BMI 59.26 kg/m    Wt Readings from Last 3 Encounters:  01/04/19 (!) 324 lb (147 kg)  12/22/18 294 lb (133.4 kg)  12/11/18 286 lb 6 oz (129.9 kg)    General: Overly obese female no acute distress Heart: Atraumatic, normal size  Eyes: PEERLA, EOMI  Neck: Supple, no JVD Endocrine: No thryomegaly Cardiac: Normal S1, S2; RRR; no murmurs, rubs, or gallops Lungs: Diminished breath sounds bilaterally Abd: Soft, nontender, no hepatomegaly  Ext: 2+ pitting edema to mid knee, lower extremities with changes of chronic venous insufficiency, as well as likely early lymphedema Musculoskeletal: No deformities, BUE and BLE strength normal and equal Skin: Warm and dry, no rashes   Neuro: Alert and oriented to person, place, time, and situation, CNII-XII grossly intact, no focal deficits  Psych: Normal mood and affect   ASSESSMENT:   NAME@ is a 68 y.o. female who presents for the following: 1. Chronic heart failure with preserved ejection fraction ([place])   2. RVF (right ventricular failure) ([facility])   3. Obesity, morbid, BMI 50 or higher ([place])   4. SOB (shortness of breath) on exertion     PLAN:   1. Chronic heart failure with preserved ejection fraction ([place]) 2. RVF (right ventricular failure) ([facility]) 3. Obesity, morbid, BMI 50 or higher ([place]) 4. SOB (shortness of breath) on exertion -She presents for evaluation of likely right heart failure in setting of chronic hypoxic respiratory failure at home and hypertension.  Her echocardiogram merely showed impaired relaxation, no increased left atrial pressure.  Her overall  condition will best be treated by oxygen therapy, weight loss reduction, and reduce salt intake.  I spent nearly 30 minutes counseling her and her husband about her condition and the need to reduce her salt intake.  I am highly concerned about her frequent eating out, including [place] buffets.  This is an extremely dangerous amount of salt for her.  Her BNP was 82.5 during her recent hospitalization.  Although this could be falsely low in her due to her obesity, I suspect more for issue with her lung disorders and con comment right heart disease. -She has significant lower extreme edema on exam today, we will increase her Lasix to 40 mg twice daily for 3 days and then resume 40 mg daily -Her blood pressure is under good control we will continue current therapy -I will arrange for her to follow-up for 2 weeks to see our pharmacist for titration of her diuretic -Hopefully she will refrain from high salt intake in the interim -I have also encouraged her to follow with pulmonology for hypoxic respiratory failure and sleep apnea -We will see her back in 3 months for routine appointment   Disposition: Return in about 3 months (around 04/05/2019).  Medication Adjustments/Labs and Tests Ordered: Current medicines are reviewed at length with the patient today.  Concerns regarding medicines are outlined above.  No orders of the defined types were placed in this encounter.  No orders of  the defined types were placed in this encounter.   Patient Instructions  Medication Instructions:  Increase Lasix to 40 mg twice a day for 3 days only then return to normal dose   Lab work: None ordered   Testing/Procedures: None ordered  Follow-Up: At [facility], you and your health needs are our priority.  As part of our continuing mission to provide you with exceptional heart care, we have created designated Provider Care Teams.  These Care Teams include your primary Cardiologist (physician) and Advanced  Practice Providers (APPs -  Physician Assistants and Nurse Practitioners) who all work together to provide you with the care you need, when you need it.  Schedule appointment with our pharmacist in 2 weeks to follow up on Lasix  Schedule appointment with Dr.Makhai Fulco in 3 months Schedulers will call with both appointments      Signed, [place] T. Audie Box, [place]  [address], [place],  [address] [phone]  01/04/2019 6:51 PM

## 2019-01-04 NOTE — Telephone Encounter (Signed)
Jessica [name] called with a question about the AVS she received at last visit.  It lists the issues that were addressed at the visit.  The last issue listed is binge eating severe.  She was confused about this issue.  Thinks it is perhaps a mistake.  Please call the home # [phone] and lm message explaining this issue.  They will be out of town so you'll have to [facility]

## 2019-01-04 NOTE — Telephone Encounter (Signed)
LMTCB x3 for pt. Message will be left open due to the nature of the call.  

## 2019-01-04 NOTE — Patient Instructions (Signed)
Medication Instructions:  Increase Lasix to 40 mg twice a day for 3 days only then return to normal dose   Lab work: None ordered   Testing/Procedures: None ordered  Follow-Up: At [facility], you and your health needs are our priority.  As part of our continuing mission to provide you with exceptional heart care, we have created designated Provider Care Teams.  These Care Teams include your primary Cardiologist (physician) and Advanced Practice Providers (APPs -  Physician Assistants and Nurse Practitioners) who all work together to provide you with the care you need, when you need it. . Schedule appointment with our pharmacist in 2 weeks to follow up on Lasix  Schedule appointment with Dr.ONeal in 3 months Schedulers will call with both appointments

## 2019-01-05 NOTE — Telephone Encounter (Signed)
We have attempted to contact pt several times with no success or call back from pt. Per triage protocol, message will be closed.  

## 2019-01-07 DIAGNOSIS — J449 Chronic obstructive pulmonary disease, unspecified: Secondary | ICD-10-CM | POA: Diagnosis not present

## 2019-01-07 DIAGNOSIS — J9622 Acute and chronic respiratory failure with hypercapnia: Secondary | ICD-10-CM | POA: Diagnosis not present

## 2019-01-07 DIAGNOSIS — G4733 Obstructive sleep apnea (adult) (pediatric): Secondary | ICD-10-CM | POA: Diagnosis not present

## 2019-01-07 DIAGNOSIS — I5033 Acute on chronic diastolic (congestive) heart failure: Secondary | ICD-10-CM | POA: Diagnosis not present

## 2019-01-07 DIAGNOSIS — J9621 Acute and chronic respiratory failure with hypoxia: Secondary | ICD-10-CM | POA: Diagnosis not present

## 2019-01-07 DIAGNOSIS — I110 Hypertensive heart disease with heart failure: Secondary | ICD-10-CM | POA: Diagnosis not present

## 2019-01-15 ENCOUNTER — Telehealth: Payer: Self-pay | Admitting: Cardiovascular Disease

## 2019-01-15 NOTE — Telephone Encounter (Signed)
LVM for patient to schedule 2 week pharmacist appt to followup on Lasix and 3 month followup with Dr. Marisue Ivan.

## 2019-01-17 DIAGNOSIS — M545 Low back pain: Secondary | ICD-10-CM | POA: Diagnosis not present

## 2019-01-17 DIAGNOSIS — M47896 Other spondylosis, lumbar region: Secondary | ICD-10-CM | POA: Diagnosis not present

## 2019-01-17 DIAGNOSIS — M4316 Spondylolisthesis, lumbar region: Secondary | ICD-10-CM | POA: Diagnosis not present

## 2019-01-18 ENCOUNTER — Telehealth: Payer: Self-pay | Admitting: Cardiovascular Disease

## 2019-01-18 DIAGNOSIS — F419 Anxiety disorder, unspecified: Secondary | ICD-10-CM | POA: Diagnosis not present

## 2019-01-18 DIAGNOSIS — E785 Hyperlipidemia, unspecified: Secondary | ICD-10-CM | POA: Diagnosis not present

## 2019-01-18 DIAGNOSIS — Z6841 Body Mass Index (BMI) 40.0 and over, adult: Secondary | ICD-10-CM | POA: Diagnosis not present

## 2019-01-18 DIAGNOSIS — G4733 Obstructive sleep apnea (adult) (pediatric): Secondary | ICD-10-CM | POA: Diagnosis not present

## 2019-01-18 DIAGNOSIS — Z9981 Dependence on supplemental oxygen: Secondary | ICD-10-CM | POA: Diagnosis not present

## 2019-01-18 DIAGNOSIS — M150 Primary generalized (osteo)arthritis: Secondary | ICD-10-CM | POA: Diagnosis not present

## 2019-01-18 DIAGNOSIS — J9622 Acute and chronic respiratory failure with hypercapnia: Secondary | ICD-10-CM | POA: Diagnosis not present

## 2019-01-18 DIAGNOSIS — J449 Chronic obstructive pulmonary disease, unspecified: Secondary | ICD-10-CM | POA: Diagnosis not present

## 2019-01-18 DIAGNOSIS — F319 Bipolar disorder, unspecified: Secondary | ICD-10-CM | POA: Diagnosis not present

## 2019-01-18 DIAGNOSIS — I110 Hypertensive heart disease with heart failure: Secondary | ICD-10-CM | POA: Diagnosis not present

## 2019-01-18 DIAGNOSIS — I5033 Acute on chronic diastolic (congestive) heart failure: Secondary | ICD-10-CM | POA: Diagnosis not present

## 2019-01-18 DIAGNOSIS — I872 Venous insufficiency (chronic) (peripheral): Secondary | ICD-10-CM | POA: Diagnosis not present

## 2019-01-18 DIAGNOSIS — E039 Hypothyroidism, unspecified: Secondary | ICD-10-CM | POA: Diagnosis not present

## 2019-01-18 DIAGNOSIS — J9621 Acute and chronic respiratory failure with hypoxia: Secondary | ICD-10-CM | POA: Diagnosis not present

## 2019-01-18 DIAGNOSIS — M48061 Spinal stenosis, lumbar region without neurogenic claudication: Secondary | ICD-10-CM | POA: Diagnosis not present

## 2019-01-18 DIAGNOSIS — M329 Systemic lupus erythematosus, unspecified: Secondary | ICD-10-CM | POA: Diagnosis not present

## 2019-01-18 NOTE — Telephone Encounter (Signed)
LVM for patient to call and scheduled 2 week followup with the pharmacist and 3 month followup with Dr. Marisue Ivan.

## 2019-01-18 NOTE — Progress Notes (Addendum)
Office Visit Note  Patient: Jessica [name]             Date of Birth: 06-Dec-1950           MRN: [id number]             PCP: Aurea Graff.Marlou Sa, MD Referring: Alroy Dust, Carlean Jews.Marlou Sa, MD Visit Date: 01/25/2019 Occupation: @GUAROCC @  Subjective:  Pain in multiple joints   History of Present Illness: Jessica [name] is a 68 y.o. female with history of DDD and osteoarthritis. She reports on  12/06/18 she was hospitalized and diagnosed with hypercapnic respiratory failure and heart failure.  She was taken off of Robaxin and Celebrex when she was discharged.  She is having increased pain in multiple joints since discontinuing robaxin and celebrex. She is having pain in both hands. She denies any joint swelling.  She has intermittent pain in both knee joints.  Activities of Daily Living:  Patient reports morning stiffness for  1 hour.   Patient Denies nocturnal pain.  Difficulty dressing/grooming: Denies Difficulty climbing stairs: Reports Difficulty getting out of chair: Reports Difficulty using hands for taps, buttons, cutlery, and/or writing: Reports  Review of Systems  Constitutional: Positive for fatigue.  HENT: Positive for mouth dryness. Negative for mouth sores and nose dryness.   Eyes: Positive for dryness. Negative for pain and visual disturbance.  Respiratory: Positive for shortness of breath (On Oxygen). Negative for cough, hemoptysis and difficulty breathing.   Cardiovascular: Positive for swelling in legs/feet. Negative for chest pain, palpitations and hypertension.  Gastrointestinal: Positive for constipation. Negative for blood in stool and diarrhea.  Endocrine: Negative for increased urination.  Genitourinary: Negative for painful urination.  Musculoskeletal: Positive for arthralgias, joint pain and morning stiffness. Negative for joint swelling, myalgias, muscle weakness, muscle tenderness and myalgias.  Skin: Negative for color change, pallor, rash, hair loss, nodules/bumps,  skin tightness, ulcers and sensitivity to sunlight.  Allergic/Immunologic: Negative for susceptible to infections.  Neurological: Negative for dizziness, numbness, headaches and weakness.  Hematological: Negative for swollen glands.  Psychiatric/Behavioral: Positive for depressed mood. Negative for sleep disturbance. The patient is nervous/anxious.     PMFS History:  Patient Active Problem List   Diagnosis Date Noted  . Hypercapnic respiratory failure ([place]) 12/06/2018  . Acute on chronic diastolic CHF (congestive heart failure) ([place])   . Acute metabolic encephalopathy   . Lower extremity edema 11/30/2018  . Cellulitis of left leg 11/30/2018  . Systemic lupus ([place])   . Morbid obesity ([place])   . Bipolar disorder ([place])   . Cellulitis 11/29/2018  . OSA (obstructive sleep apnea) 11/27/2018  . Chronic hypoxemic respiratory failure ([place]) 11/27/2018  . Primary osteoarthritis of both hands 04/19/2017  . Primary osteoarthritis of both knees 04/19/2017  . Primary osteoarthritis of both feet 04/19/2017  . DDD (degenerative disc disease), lumbar 04/19/2017  . History of bipolar disorder 04/19/2017  . Hypothyroidism 06/04/2014  . Chest pain 01/23/2013  . ARF (acute renal failure) ([place]) 01/23/2013  . HTN (hypertension) 01/23/2013  . Leukocytosis 01/23/2013  . HLD (hyperlipidemia) 01/23/2013    Past Medical History:  Diagnosis Date  . Arthritis    lower back, hands, knees  . Bipolar disorder ([place])   . Cellulitis and abscess of left leg 11/29/2018  . Depression   . Dyspnea    with exertion  . GERD (gastroesophageal reflux disease)    patient unsure about this dx - no meds  . Headache   . History of IBS    watches  diet  . Hyperlipidemia   . Hypertension   . Hypothyroidism   . Obese   . Plantar fasciitis   . Seasonal allergies   . Sleep apnea 2017   uses CPAP   . Spinal stenosis of lumbar region   . SVD (spontaneous vaginal delivery)    x 2  . Systemic lupus ([place])   . Thyroid  disease     Family History  Problem Relation Age of Onset  . Thyroid disease Mother   . Breast cancer Mother   . Heart disease Mother   . Bipolar disorder Father   . Diabetes Father   . Heart disease Father   . Dementia Father    Past Surgical History:  Procedure Laterality Date  . CARPAL TUNNEL RELEASE Left 09/16/2017   Procedure: LEFT CARPAL TUNNEL RELEASE;  Surgeon: Daryll Brod, MD;  Location: [facility];  Service: Orthopedics;  Laterality: Left;  . carpel tunnel surgery Right   . CATARACT EXTRACTION Bilateral 2007   w/ lens implants  . COLONOSCOPY    . DILATION AND CURETTAGE OF UTERUS    . EYE SURGERY    . FOOT SURGERY Right    hammer toe  . FRACTURE SURGERY     R tibia and fibula  . LEG SURGERY Right   . TONSILLECTOMY    . WISDOM TOOTH EXTRACTION     Social History   Social History Narrative   She lives in a single level home with her husband.  She has two grown children.   She taught college accounting courses, retired in 2005.    Highest level of education:  Doctorate in accounting    There is no immunization history on file for this patient.   Objective: Vital Signs: BP 130/74 (BP Location: Left Wrist, Patient Position: Sitting, Cuff Size: Normal)   Pulse 81   Resp 16   Ht 5\' 2"  (1.575 m)   Wt 295 lb (133.8 kg)   BMI 53.96 kg/m    Physical Exam Vitals signs and nursing note reviewed.  Constitutional:      Appearance: She is well-developed.  HENT:     Head: Normocephalic and atraumatic.  Eyes:     Conjunctiva/sclera: Conjunctivae normal.  Neck:     Musculoskeletal: Normal range of motion.  Cardiovascular:     Rate and Rhythm: Normal rate and regular rhythm.     Heart sounds: Normal heart sounds.  Pulmonary:     Effort: Pulmonary effort is normal.     Breath sounds: Normal breath sounds.  Abdominal:     General: Bowel sounds are normal.     Palpations: Abdomen is soft.  Lymphadenopathy:     Cervical: No cervical adenopathy.  Skin:    General:  Skin is warm and dry.     Capillary Refill: Capillary refill takes less than 2 seconds.  Neurological:     Mental Status: She is alert and oriented to person, place, and time.  Psychiatric:        Behavior: Behavior normal.      Musculoskeletal Exam: C-spine good ROM.  Shoulder joints, elbow joints, wrist joints, MCPs, PIPs, and DIPs good ROM with no synovitis.  PIP and DIP synovial thickening consistent with osteoarthritis of both hands.  Hip joints good ROM with no discomfort.  Knee joints good ROM with no warmth or effusion.  Significant pedal edema bilaterally.   CDAI Exam: CDAI Score: - Patient Global: -; Provider Global: - Swollen: -; Tender: - Joint Exam  No joint exam has been documented for this visit   There is currently no information documented on the homunculus. Go to the Rheumatology activity and complete the homunculus joint exam.  Investigation: No additional findings.  Imaging: No results found.  Recent Labs: Lab Results  Component Value Date   WBC 16.7 (H) 12/11/2018   HGB 14.4 12/11/2018   PLT 337 12/11/2018   NA 138 12/10/2018   K 3.8 12/10/2018   CL 94 (L) 12/10/2018   CO2 33 (H) 12/10/2018   GLUCOSE 113 (H) 12/10/2018   BUN 30 (H) 12/10/2018   CREATININE 1.17 (H) 12/10/2018   BILITOT 0.5 12/09/2018   ALKPHOS 105 12/09/2018   AST 29 12/09/2018   ALT 21 12/09/2018   PROT 6.9 12/09/2018   ALBUMIN 3.8 12/09/2018   CALCIUM 11.1 (H) 12/10/2018   GFRAA 55 (L) 12/10/2018    Speciality Comments: No specialty comments available.  Procedures:  No procedures performed Allergies: Ibuprofen, Codeine, Nabumetone, Promethazine, Amoxicillin-pot clavulanate, Erythromycin base, and Promethazine-codeine      Assessment / Plan:     Visit Diagnoses: Primary osteoarthritis of both hands: She has been having increased pain in both hands. She was taken off of Celebrex in August, and she has been having increased joint pain since then. She has no synovitis on  exam.  She has complete fist formation bilaterally.  Joint protection and muscle strengthening were discussed.  She will follow up in 6 months.  Patient is experiencing a lot of joint pain off the medications.  We discussed possible use of Cymbalta if it is compatible with her medications as it is helpful with the pain management.  She will discuss this further with her psychiatrist and her PCP.  Primary osteoarthritis of both knees - chondromalacia patella: She has intermittent pain in both knee joints.  She has good ROM with no discomfort.  No warmth or effusion of knee joints.  Weight loss and dietary changes were discussed.  She was given information about the [facility] weight loss program.   Primary osteoarthritis of both feet: She has no feet pain at this time.  She has severe pedal edema.   DDD (degenerative disc disease), lumbar: Chronic pain.   BMI 53.96-weight loss and dietary modifications were discussed.  Have also given her a handout on weight loss clinic.  Other medical conditions are listed as follows:   History of bipolar disorder  History of hypothyroidism  History of IBS  History of urinary incontinence  History of hypertension  Pedal edema  Orders: No orders of the defined types were placed in this encounter.  No orders of the defined types were placed in this encounter.     Follow-Up Instructions: Return in about 6 months (around 07/25/2019) for Osteoarthritis, DDD.   Ofilia Neas, PA-C   I examined and evaluated the patient with Hazel Sams PA.  Patient has been experiencing a lot of pain and discomfort in her joints since she has been off Celebrex and methocarbamol.  Reasoning for discontinuation of medications was discussed.  She may benefit from Cymbalta as it is  beneficial in the pain management.  I have advised her to discuss that further with her PCP if that is the medication which can be used.  I also offered her referral to pain management but she declined.   I have given her information on weight management clinic.  The plan of care was discussed as noted above.  Bo Merino, MD  Note - This record  has been created using [employer].  Chart creation errors have been sought, but may not always  have been located. Such creation errors do not reflect on  the standard of medical care.

## 2019-01-19 DIAGNOSIS — L821 Other seborrheic keratosis: Secondary | ICD-10-CM | POA: Diagnosis not present

## 2019-01-19 DIAGNOSIS — L820 Inflamed seborrheic keratosis: Secondary | ICD-10-CM | POA: Diagnosis not present

## 2019-01-21 ENCOUNTER — Other Ambulatory Visit: Payer: Self-pay | Admitting: Psychiatry

## 2019-01-22 ENCOUNTER — Telehealth: Payer: Self-pay | Admitting: Psychiatry

## 2019-01-22 NOTE — Telephone Encounter (Signed)
Wonders if med changes is causing her some issues.  Around 8-9pm she starts to experience very dismal thoughts which are not normal for her.  Thinks this is new due to the med. - tapering Duloxetine and started Sertraline. Still taking Duloxetine 1/2 pill, Sertraline 1/2 of new dose -only started this change about a week ago. Also, she does not think she has binge eating disorder which is on her chart.

## 2019-01-22 NOTE — Telephone Encounter (Signed)
She could possibly feel a little worse before she feels better with the med change.  Go ahead and increase the sertraline to 1 daily and stop the duloxetine to try to speed up response.  If she brings up the binge eating diagnosis just mentioned I will discuss it with her at her follow-up appointment.

## 2019-01-23 ENCOUNTER — Other Ambulatory Visit: Payer: Self-pay | Admitting: Psychiatry

## 2019-01-23 DIAGNOSIS — F319 Bipolar disorder, unspecified: Secondary | ICD-10-CM

## 2019-01-23 NOTE — Telephone Encounter (Signed)
Pt. Made aware and verbalized understanding. Will get labs drawn soon.

## 2019-01-23 NOTE — Progress Notes (Signed)
Patient starting low-salt diet.  We will check a lithium level and BMP

## 2019-01-23 NOTE — Telephone Encounter (Signed)
Salt restriction can raise lithium levels.  After about a week of a salt restricted diet we should check a lithium level and her kidney function status.  I sent the lab request in to [facility].

## 2019-01-23 NOTE — Telephone Encounter (Signed)
I gave pt the information below.  She had an additional question about her salt intake-  She was hospitalized in August and they suggested she decrease her salt intake. Pt wonders how reduction in salt can affect her lithium uptake and lith level?

## 2019-01-24 ENCOUNTER — Telehealth: Payer: Self-pay | Admitting: Pulmonary Disease

## 2019-01-24 NOTE — Telephone Encounter (Signed)
Called and spoke to patient. Patient needed to reschedule pre-procedure covid testing.  Moved her testing date/time. Nothing further needed at this time.

## 2019-01-24 NOTE — Telephone Encounter (Signed)
Will discuss at next office visit

## 2019-01-25 ENCOUNTER — Encounter: Payer: Self-pay | Admitting: Rheumatology

## 2019-01-25 ENCOUNTER — Ambulatory Visit (INDEPENDENT_AMBULATORY_CARE_PROVIDER_SITE_OTHER): Payer: Medicare Other | Admitting: Rheumatology

## 2019-01-25 ENCOUNTER — Telehealth: Payer: Self-pay | Admitting: Rheumatology

## 2019-01-25 ENCOUNTER — Other Ambulatory Visit: Payer: Self-pay

## 2019-01-25 ENCOUNTER — Ambulatory Visit: Payer: Medicare Other | Admitting: Psychiatry

## 2019-01-25 ENCOUNTER — Other Ambulatory Visit (HOSPITAL_COMMUNITY): Payer: Medicare Other

## 2019-01-25 ENCOUNTER — Telehealth: Payer: Self-pay | Admitting: Psychiatry

## 2019-01-25 VITALS — BP 130/74 | HR 81 | Resp 16 | Ht 62.0 in | Wt 295.0 lb

## 2019-01-25 DIAGNOSIS — Z8719 Personal history of other diseases of the digestive system: Secondary | ICD-10-CM | POA: Diagnosis not present

## 2019-01-25 DIAGNOSIS — Z8679 Personal history of other diseases of the circulatory system: Secondary | ICD-10-CM | POA: Diagnosis not present

## 2019-01-25 DIAGNOSIS — Z8659 Personal history of other mental and behavioral disorders: Secondary | ICD-10-CM | POA: Diagnosis not present

## 2019-01-25 DIAGNOSIS — Z8639 Personal history of other endocrine, nutritional and metabolic disease: Secondary | ICD-10-CM | POA: Diagnosis not present

## 2019-01-25 DIAGNOSIS — M51369 Other intervertebral disc degeneration, lumbar region without mention of lumbar back pain or lower extremity pain: Secondary | ICD-10-CM

## 2019-01-25 DIAGNOSIS — M170 Bilateral primary osteoarthritis of knee: Secondary | ICD-10-CM

## 2019-01-25 DIAGNOSIS — M19042 Primary osteoarthritis, left hand: Secondary | ICD-10-CM | POA: Diagnosis not present

## 2019-01-25 DIAGNOSIS — R600 Localized edema: Secondary | ICD-10-CM | POA: Diagnosis not present

## 2019-01-25 DIAGNOSIS — M5136 Other intervertebral disc degeneration, lumbar region: Secondary | ICD-10-CM

## 2019-01-25 DIAGNOSIS — M19072 Primary osteoarthritis, left ankle and foot: Secondary | ICD-10-CM

## 2019-01-25 DIAGNOSIS — Z6841 Body Mass Index (BMI) 40.0 and over, adult: Secondary | ICD-10-CM | POA: Diagnosis not present

## 2019-01-25 DIAGNOSIS — M19071 Primary osteoarthritis, right ankle and foot: Secondary | ICD-10-CM

## 2019-01-25 DIAGNOSIS — Z87898 Personal history of other specified conditions: Secondary | ICD-10-CM

## 2019-01-25 DIAGNOSIS — M19041 Primary osteoarthritis, right hand: Secondary | ICD-10-CM | POA: Diagnosis not present

## 2019-01-25 NOTE — Telephone Encounter (Signed)
Okay to round office note to her psychiatrist.

## 2019-01-25 NOTE — Telephone Encounter (Signed)
Jessica [name] called to report that she had gone to see her rheumatogist for her arthritis pain and it was suggested that she take Cymbalta because it has benefits to helping with joint pain.  But the Dr. Michela Pitcher you would need to prescribe it.  She hasn't been happy with her anti-depressant anyway.  Would you consider prescribing the Cymbalta?  Please call to discuss.  Next appt 11/4.

## 2019-01-25 NOTE — Telephone Encounter (Signed)
Patient request information on medication (Cymbalta)  Dr. Estanislado Pandy recommended for her to be sent to Dr. Deland Pretty at [facility]. Please call patient with questions.

## 2019-01-25 NOTE — Patient Instructions (Addendum)
Cymbalta 

## 2019-01-25 NOTE — Telephone Encounter (Signed)
She does not realize that she was just on Cymbalta at her last visit in August.  She probably knows it by the generic name duloxetine.  We began a switch to sertraline because she was feeling stressed and anxious and sertraline tends to be better for those symptoms.  I have not seen her since the switch and we have not finished adjusting on that dosage.  Unless she thinks her pain is worse since stopping the Cymbalta which is duloxetine but does not make much sense to return to it.  If her pain is much worse since she has stopped duloxetine then I am willing to consider switching it back.

## 2019-01-26 ENCOUNTER — Other Ambulatory Visit: Payer: Self-pay | Admitting: Psychiatry

## 2019-01-26 ENCOUNTER — Other Ambulatory Visit (HOSPITAL_COMMUNITY)
Admission: RE | Admit: 2019-01-26 | Discharge: 2019-01-26 | Disposition: A | Discharge status: Home / Self Care | Payer: Medicare Other | Source: Ambulatory Visit | Attending: Pulmonary Disease | Admitting: Pulmonary Disease

## 2019-01-26 DIAGNOSIS — Z01812 Encounter for preprocedural laboratory examination: Secondary | ICD-10-CM | POA: Diagnosis not present

## 2019-01-26 DIAGNOSIS — Z20828 Contact with and (suspected) exposure to other viral communicable diseases: Secondary | ICD-10-CM | POA: Insufficient documentation

## 2019-01-26 MED ORDER — DULOXETINE HCL 60 MG PO CPEP: 60.0000 mg | ORAL_CAPSULE | Freq: Every day | ORAL | 0 refills | Status: DC

## 2019-01-26 NOTE — Telephone Encounter (Signed)
He has not been off the duloxetine long.  She can to stop the sertraline 1 day and start the duloxetine 60 mg the next day.  I will send in the prescription.

## 2019-01-26 NOTE — Telephone Encounter (Signed)
Patient aware and appreciative. Instructed to call back with any issues.

## 2019-01-27 LAB — NOVEL CORONAVIRUS, NAA (HOSP ORDER, SEND-OUT TO REF LAB; TAT 18-24 HRS): SARS-CoV-2, NAA: NOT DETECTED

## 2019-01-29 ENCOUNTER — Ambulatory Visit: Payer: Medicare Other | Admitting: Pulmonary Disease

## 2019-01-29 ENCOUNTER — Other Ambulatory Visit: Payer: Self-pay

## 2019-01-29 ENCOUNTER — Encounter: Payer: Self-pay | Admitting: Pulmonary Disease

## 2019-01-29 ENCOUNTER — Telehealth: Payer: Self-pay | Admitting: Cardiovascular Disease

## 2019-01-29 ENCOUNTER — Ambulatory Visit (INDEPENDENT_AMBULATORY_CARE_PROVIDER_SITE_OTHER): Payer: Medicare Other | Admitting: Pulmonary Disease

## 2019-01-29 ENCOUNTER — Ambulatory Visit: Payer: Medicare Other

## 2019-01-29 VITALS — BP 128/70 | HR 76 | Temp 98.3°F | Ht 62.0 in | Wt 296.8 lb

## 2019-01-29 DIAGNOSIS — G4733 Obstructive sleep apnea (adult) (pediatric): Secondary | ICD-10-CM

## 2019-01-29 DIAGNOSIS — J9611 Chronic respiratory failure with hypoxia: Secondary | ICD-10-CM | POA: Diagnosis not present

## 2019-01-29 DIAGNOSIS — I5032 Chronic diastolic (congestive) heart failure: Secondary | ICD-10-CM | POA: Diagnosis not present

## 2019-01-29 DIAGNOSIS — Z5181 Encounter for therapeutic drug level monitoring: Secondary | ICD-10-CM

## 2019-01-29 MED ORDER — LITHIUM CARBONATE 150 MG PO CAPS: 600.0000 mg | ORAL_CAPSULE | Freq: Every day | ORAL | 0 refills | Status: DC

## 2019-01-29 NOTE — Patient Instructions (Addendum)
Chronic diastolic heart failure Will send message for sooner appointment  Chronic hypoxemic respiratory failure CONTINUE supplemental oxygen to maintain levels for greater 88%.  START Albuterol prior to activity when you usually get short of breath We will message your DME to request an addition oxygen tank for the home due to mobility within the home  Obstructive sleep apnea Please contact Dr. Ronnie Derby office to schedule visit to discuss PAP re-titration       DASH Eating Plan DASH stands for "Dietary Approaches to Stop Hypertension." The DASH eating plan is a healthy eating plan that has been shown to reduce high blood pressure (hypertension). It may also reduce your risk for type 2 diabetes, heart disease, and stroke. The DASH eating plan may also help with weight loss. What are tips for following this plan?  General guidelines  Avoid eating more than 2,300 mg (milligrams) of salt (sodium) a day. If you have hypertension, you may need to reduce your sodium intake to 1,500 mg a day.  Limit alcohol intake to no more than 1 drink a day for nonpregnant women and 2 drinks a day for men. One drink equals 12 oz of beer, 5 oz of wine, or 1 oz of hard liquor.  Work with your health care provider to maintain a healthy body weight or to lose weight. Ask what an ideal weight is for you.  Get at least 30 minutes of exercise that causes your heart to beat faster (aerobic exercise) most days of the week. Activities may include walking, swimming, or biking.  Work with your health care provider or diet and nutrition specialist (dietitian) to adjust your eating plan to your individual calorie needs. Reading food labels   Check food labels for the amount of sodium per serving. Choose foods with less than 5 percent of the Daily Value of sodium. Generally, foods with less than 300 mg of sodium per serving fit into this eating plan.  To find whole grains, look for the word "whole" as the first word  in the ingredient list. Shopping  Buy products labeled as "low-sodium" or "no salt added."  Buy fresh foods. Avoid canned foods and premade or frozen meals. Cooking  Avoid adding salt when cooking. Use salt-free seasonings or herbs instead of table salt or sea salt. Check with your health care provider or pharmacist before using salt substitutes.  Do not fry foods. Cook foods using healthy methods such as baking, boiling, grilling, and broiling instead.  Cook with heart-healthy oils, such as olive, canola, soybean, or sunflower oil. Meal planning  Eat a balanced diet that includes: ? 5 or more servings of fruits and vegetables each day. At each meal, try to fill half of your plate with fruits and vegetables. ? Up to 6-8 servings of whole grains each day. ? Less than 6 oz of lean meat, poultry, or fish each day. A 3-oz serving of meat is about the same size as a deck of cards. One egg equals 1 oz. ? 2 servings of low-fat dairy each day. ? A serving of nuts, seeds, or beans 5 times each week. ? Heart-healthy fats. Healthy fats called Omega-3 fatty acids are found in foods such as flaxseeds and coldwater fish, like sardines, salmon, and mackerel.  Limit how much you eat of the following: ? Canned or prepackaged foods. ? Food that is high in trans fat, such as fried foods. ? Food that is high in saturated fat, such as fatty meat. ? Sweets, desserts, sugary  drinks, and other foods with added sugar. ? Full-fat dairy products.  Do not salt foods before eating.  Try to eat at least 2 vegetarian meals each week.  Eat more home-cooked food and less restaurant, buffet, and fast food.  When eating at a restaurant, ask that your food be prepared with less salt or no salt, if possible. What foods are recommended? The items listed may not be a complete list. Talk with your dietitian about what dietary choices are best for you. Grains Whole-grain or whole-wheat bread. Whole-grain or  whole-wheat pasta. Brown rice. [place]. Bulgur. Whole-grain and low-sodium cereals. Pita bread. Low-fat, low-sodium crackers. Whole-wheat flour tortillas. Vegetables Fresh or frozen vegetables (raw, steamed, roasted, or grilled). Low-sodium or reduced-sodium tomato and vegetable juice. Low-sodium or reduced-sodium tomato sauce and tomato paste. Low-sodium or reduced-sodium canned vegetables. Fruits All fresh, dried, or frozen fruit. Canned fruit in natural juice (without added sugar). Meat and other protein foods Skinless chicken or [place]. Ground chicken or [place]. Pork with fat trimmed off. Fish and seafood. Egg whites. Dried beans, peas, or lentils. Unsalted nuts, nut butters, and seeds. Unsalted canned beans. Lean cuts of beef with fat trimmed off. Low-sodium, lean deli meat. Dairy Low-fat (1%) or fat-free (skim) milk. Fat-free, low-fat, or reduced-fat cheeses. Nonfat, low-sodium ricotta or cottage cheese. Low-fat or nonfat yogurt. Low-fat, low-sodium cheese. Fats and oils Soft margarine without trans fats. Vegetable oil. Low-fat, reduced-fat, or light mayonnaise and salad dressings (reduced-sodium). Canola, safflower, olive, soybean, and sunflower oils. Avocado. Seasoning and other foods Herbs. Spices. Seasoning mixes without salt. Unsalted popcorn and pretzels. Fat-free sweets. What foods are not recommended? The items listed may not be a complete list. Talk with your dietitian about what dietary choices are best for you. Grains Baked goods made with fat, such as croissants, muffins, or some breads. Dry pasta or rice meal packs. Vegetables Creamed or fried vegetables. Vegetables in a cheese sauce. Regular canned vegetables (not low-sodium or reduced-sodium). Regular canned tomato sauce and paste (not low-sodium or reduced-sodium). Regular tomato and vegetable juice (not low-sodium or reduced-sodium). Angie Fava. Olives. Fruits Canned fruit in a light or heavy syrup. Fried fruit. Fruit  in cream or butter sauce. Meat and other protein foods Fatty cuts of meat. Ribs. Fried meat. Berniece Salines. Sausage. Bologna and other processed lunch meats. Salami. Fatback. Hotdogs. Bratwurst. Salted nuts and seeds. Canned beans with added salt. Canned or smoked fish. Whole eggs or egg yolks. Chicken or [place] with skin. Dairy Whole or 2% milk, cream, and half-and-half. Whole or full-fat cream cheese. Whole-fat or sweetened yogurt. Full-fat cheese. Nondairy creamers. Whipped toppings. Processed cheese and cheese spreads. Fats and oils Butter. Stick margarine. Lard. Shortening. Ghee. Bacon fat. Tropical oils, such as coconut, palm kernel, or palm oil. Seasoning and other foods Salted popcorn and pretzels. Onion salt, garlic salt, seasoned salt, table salt, and sea salt. Worcestershire sauce. Tartar sauce. Barbecue sauce. Teriyaki sauce. Soy sauce, including reduced-sodium. Steak sauce. Canned and packaged gravies. Fish sauce. Oyster sauce. Cocktail sauce. Horseradish that you find on the shelf. Ketchup. Mustard. Meat flavorings and tenderizers. Bouillon cubes. Hot sauce and Tabasco sauce. Premade or packaged marinades. Premade or packaged taco seasonings. Relishes. Regular salad dressings. Where to find more information:  National Heart, Lung, and [place]: https://wilson-eaton.com/  American Heart Association: www.heart.org Summary  The DASH eating plan is a healthy eating plan that has been shown to reduce high blood pressure (hypertension). It may also reduce your risk for type 2 diabetes, heart disease, and  stroke.  With the DASH eating plan, you should limit salt (sodium) intake to 2,300 mg a day. If you have hypertension, you may need to reduce your sodium intake to 1,500 mg a day.  When on the DASH eating plan, aim to eat more fresh fruits and vegetables, whole grains, lean proteins, low-fat dairy, and heart-healthy fats.  Work with your health care provider or diet and nutrition specialist  (dietitian) to adjust your eating plan to your individual calorie needs. This information is not intended to replace advice given to you by your health care provider. Make sure you discuss any questions you have with your health care provider. Document Released: 04/08/2011 Document Revised: 04/01/2017 Document Reviewed: 04/12/2016 Elsevier Patient Education  2020 [employer].

## 2019-01-29 NOTE — Telephone Encounter (Signed)
LVM for patient to call and schedule 1-2 week appointment with PA per Dr. Marisue Ivan

## 2019-01-29 NOTE — Progress Notes (Signed)
Subjective:   PATIENT ID: Jessica [name] GENDER: female DOB: 24-Mar-1951, MRN: [id number]   HPI  Chief Complaint  Patient presents with   Follow-up    Reason for Visit: Follow-up   [name] is a 68 year old female never smoker with OSA on CPAP, spinal stenosis and lupus who presents for follow-up for shortness of breath.  Since our last visit, she was hospitalized in the beginning of August for LE cellulitis and chronic diastolic heart failure. She is followed by Cardiology and had her diuretic regimen titrated up with advise to improve her dietary salt intake. Her Sleep doctor has been trying to contact her unfortunately she has not been able to reschedule to discuss PAP titration. She was unable to complete her PFTs today. She has shortness of breath with exertion. To avoid this she slows down her pace significantly. She has not taken Albuterol. She reports compliance with her CPAP nightly >4 hours.  Social History: Never smoker  Environmental exposures: None  I have personally reviewed patient's past medical/family/social history/allergies/current medications.  Past Medical History:  Diagnosis Date   Arthritis    lower back, hands, knees   Bipolar disorder ([place])    Cellulitis and abscess of left leg 11/29/2018   Depression    Dyspnea    with exertion   GERD (gastroesophageal reflux disease)    patient unsure about this dx - no meds   Headache    History of IBS    watches diet   Hyperlipidemia    Hypertension    Hypothyroidism    Obese    Plantar fasciitis    Seasonal allergies    Sleep apnea 2017   uses CPAP    Spinal stenosis of lumbar region    SVD (spontaneous vaginal delivery)    x 2   Systemic lupus ([place])    Thyroid disease      Family History  Problem Relation Age of Onset   Thyroid disease Mother    Breast cancer Mother    Heart disease Mother    Bipolar disorder Father    Diabetes Father    Heart  disease Father    Dementia Father      Social History   Occupational History   Not on file  Tobacco Use   Smoking status: Never Smoker   Smokeless tobacco: Never Used  Substance and Sexual Activity   Alcohol use: Yes    Comment: rarely   Drug use: Never   Sexual activity: Not on file    Allergies  Allergen Reactions   Ibuprofen Diarrhea, Nausea Only and Other (See Comments)    Extreme stomach pain  Makes her feel bad   Codeine     UNSPECIFIED REACTION    Nabumetone     UNSPECIFIED REACTION    Promethazine     UNSPECIFIED REACTION    Amoxicillin-Pot Clavulanate Nausea And Vomiting   Erythromycin Base Diarrhea   Promethazine-Codeine Other (See Comments)    "Makes her feel bad"     Outpatient Medications Prior to Visit  Medication Sig Dispense Refill   acetaminophen (TYLENOL) 650 MG CR tablet Take 650 mg by mouth every 8 (eight) hours as needed for pain.     Acetylcysteine 600 MG CAPS Take 600 mg by mouth 2 (two) times daily. NAC     albuterol (PROVENTIL) (2.5 MG/3ML) 0.083% nebulizer solution Take 3 mLs (2.5 mg total) by nebulization 2 (two) times daily as needed for wheezing or shortness  of breath. 75 mL 0   albuterol (VENTOLIN HFA) 108 (90 Base) MCG/ACT inhaler Inhale 2 puffs into the lungs every 6 (six) hours as needed for wheezing or shortness of breath. 8 g 4   amLODipine (NORVASC) 10 MG tablet Take 1 tablet (10 mg total) by mouth daily. 60 tablet 0   cycloSPORINE (RESTASIS) 0.05 % ophthalmic emulsion Place 1 drop into both eyes 2 (two) times daily.     DULoxetine (CYMBALTA) 60 MG capsule Take 1 capsule (60 mg total) by mouth daily. 90 capsule 0   econazole nitrate 1 % cream Apply 1 application topically 3 (three) times daily as needed (fungus).      fluticasone (FLONASE) 50 MCG/ACT nasal spray Place 1 spray into both nostrils daily.     furosemide (LASIX) 40 MG tablet Take 1 tablet (40 mg total) by mouth daily. 60 tablet 0   gabapentin  (NEURONTIN) 300 MG capsule Take 1 capsule (300 mg total) by mouth at bedtime. 30 capsule 0   ketoconazole (NIZORAL) 2 % cream Apply 1 application topically 3 (three) times daily as needed for irritation.     lisdexamfetamine (VYVANSE) 60 MG capsule Take 1 capsule (60 mg total) by mouth every morning. 30 capsule 0   lithium carbonate 150 MG capsule Take 5 capsules (750 mg total) by mouth at bedtime for 20 days. 100 capsule 0   LORazepam (ATIVAN) 0.5 MG tablet Take 1 tablet (0.5 mg total) by mouth 2 (two) times daily as needed for anxiety. 10 tablet 0   metoprolol tartrate (LOPRESSOR) 25 MG tablet Take 0.5 tablets (12.5 mg total) by mouth 2 (two) times daily. 60 tablet 0   montelukast (SINGULAIR) 10 MG tablet Take 1 tablet by mouth daily.     Multiple Vitamins-Minerals (MULTIVITAMIN WITH MINERALS) tablet Take 1 tablet by mouth daily.     Multiple Vitamins-Minerals (PRESERVISION AREDS 2) CAPS Take 1 capsule 2 (two) times daily by mouth.     potassium chloride SA (K-DUR) 20 MEQ tablet Take 1 tablet (20 mEq total) by mouth daily. 30 tablet 0   pravastatin (PRAVACHOL) 20 MG tablet Take 20 mg at bedtime by mouth.      SYNTHROID 200 MCG tablet TAKE 1 TABLET BY MOUTH EVERY MORNING ON AN EMPTY STOMACH     Thiamine HCl (VITAMIN B1) 100 MG TABS Take 1 tablet by mouth daily.     vitamin B-12 (CYANOCOBALAMIN) 100 MCG tablet Take 100 mcg by mouth 2 (two) times daily.     Vitamin D, Ergocalciferol, (DRISDOL) 1.25 MG (50000 UT) CAPS capsule Take 50,000 Units by mouth every 7 (seven) days.     levothyroxine (SYNTHROID) 25 MCG tablet Take 225 mcg by mouth daily before breakfast.      Acetylcysteine (NAC) 600 MG CAPS Take 600 mg by mouth 2 (two) times daily.     Carboxymethylcellul-Glycerin (REFRESH OPTIVE OP) Place 1-2 drops into both eyes 4 (four) times daily as needed (dry eyes).      sodium chloride (MURO 128) 5 % ophthalmic solution 1 drop 4 (four) times daily as needed for eye irritation.      No facility-administered medications prior to visit.     Review of Systems  Constitutional: Negative for chills, diaphoresis, fever, malaise/fatigue and weight loss.  HENT: Negative for congestion, ear pain and sore throat.   Respiratory: Positive for shortness of breath and wheezing. Negative for cough, hemoptysis and sputum production.   Cardiovascular: Negative for chest pain (chest tightness), palpitations and leg swelling.  Gastrointestinal: Negative for abdominal pain, heartburn and nausea.  Genitourinary: Negative for frequency.  Musculoskeletal: Negative for joint pain and myalgias.  Skin: Negative for itching and rash.  Neurological: Negative for dizziness, weakness and headaches.  Endo/Heme/Allergies: Does not bruise/bleed easily.  Psychiatric/Behavioral: Negative for depression. The patient is not nervous/anxious.     Objective:   Vitals:   01/29/19 1506 01/29/19 1507  BP:  128/70  Pulse:  76  Temp: 98.3 F (36.8 C)   TempSrc: Temporal   SpO2:  99%  Weight: 296 lb 12.8 oz (134.6 kg)   Height: 5\' 2"  (1.575 m)    SpO2: 99 % O2 Device: Nasal cannula O2 Flow Rate (L/min): 4 L/min O2 Type: Continuous O2  Physical Exam: General: Obese, no acute distress HENT: [place], AT Eyes: EOMI, no scleral icterus Respiratory: Clear to auscultation bilaterally.  No crackles, wheezing or rales Cardiovascular: RRR, -M/R/G, no JVD GI: BS+, soft, nontender Extremities:-Edema,-tenderness Neuro: AAO x4, CNII-XII grossly intact Skin: Intact, no rashes or bruising Psych: Normal mood, normal affect  Data Reviewed:  Imaging: CXR 11/16/18 - No infiltrate, edema or effusion. Enlarged cardiac silhouette CXR 12/09/18 - Cardiomegaly, bibasilar atelectasis  PFT: None on file  Labs: CBC    Component Value Date/Time   WBC 16.7 (H) 12/11/2018 0753   RBC 4.62 12/11/2018 0753   HGB 14.4 12/11/2018 0753   HCT 47.6 (H) 12/11/2018 0753   PLT 337 12/11/2018 0753   MCV 103.0 (H) 12/11/2018  0753   MCH 31.2 12/11/2018 0753   MCHC 30.3 12/11/2018 0753   RDW 13.3 12/11/2018 0753   LYMPHSABS 2.0 12/11/2018 0753   MONOABS 1.7 (H) 12/11/2018 0753   EOSABS 0.5 12/11/2018 0753   BASOSABS 0.1 12/11/2018 0753   CMP     Component Value Date/Time   NA 138 12/10/2018 0810   K 3.8 12/10/2018 0810   CL 94 (L) 12/10/2018 0810   CO2 33 (H) 12/10/2018 0810   GLUCOSE 113 (H) 12/10/2018 0810   BUN 30 (H) 12/10/2018 0810   CREATININE 1.17 (H) 12/10/2018 0810   CREATININE 0.90 05/15/2018 1118   CALCIUM 11.1 (H) 12/10/2018 0810   PROT 6.9 12/09/2018 0543   ALBUMIN 3.8 12/09/2018 0543   AST 29 12/09/2018 0543   ALT 21 12/09/2018 0543   ALKPHOS 105 12/09/2018 0543   BILITOT 0.5 12/09/2018 0543   GFRNONAA 48 (L) 12/10/2018 0810   GFRNONAA 66 05/15/2018 1118   GFRAA 55 (L) 12/10/2018 0810   GFRAA 76 05/15/2018 1118   Sleep Study: 02/2016 - AHI 133  Ambulator O2 11/22/18 Patient Saturations on Room Air at Rest = 88%  Patient Saturations on Room Air while Ambulating = 88%  Patient Saturations on 4 Liters of oxygen while Ambulating = 91%  Recommend 2L of O2 at rest, 4L of O2 with exertion  TTE  12/02/18 - EF 60-65%. Diastolic heart failure, mildly decreased RV systolic function. Aortic and mitral annular calcification.  Imaging, labs and test noted above have been reviewed independently by me.    Assessment & Plan:   Discussion: 68 year old morbidly obese female never smoker with OSA on CPAP, lumbar DDD and stenosis, chronic back pain, bilateral osteoarthritis of hands, knees and feet and lupus who presents for shortness of breath. Likely multifactorial. Counseled patient on compliance with current medications, CPAP and home oxygen. We discussed regular activity and acknowledged that her limitation was related to chronic pain. Her pain medications were decreased while in-patient due to oversedation and although she  is more alert, her pain is not well-controlled.  Chronic  diastolic heart failure Will send message for sooner appointment  Chronic hypoxemic respiratory failure CONTINUE supplemental oxygen to maintain levels for greater 88%.  START Albuterol prior to activity when you usually get short of breath We will message your DME to request an addition oxygen tank for the home due to mobility within the home  Obstructive sleep apnea Please contact Dr. Ronnie Derby office to schedule visit to discuss PAP re-titration   Chronic pain Followed by Rheumatology Will refer to Pain Clinic  Morbid Obesity Discussed portion control, calorie counting and DASH diet with goal for weight loss. Discussed regular aerobic activity.  Orders Placed This Encounter  Procedures   Lithium level    Standing Status:   Future    Number of Occurrences:   1    Standing Expiration Date:   123XX123   Basic Metabolic Panel (BMET)    Standing Status:   Future    Number of Occurrences:   1    Standing Expiration Date:   01/29/2020   Ambulatory Referral for DME    Referral Priority:   Routine    Referral Type:   Durable Medical Equipment Purchase    Number of Visits Requested:   1   No orders of the defined types were placed in this encounter.   Return in about 3 months (around 04/30/2019).   Greater than 50% of this patient 25-minute office visit was spent face-to-face in counseling with the patient/family. We discussed medical diagnosis and treatment plan as noted.  [place], MD [facility] 01/29/2019 3:27 PM  Office Number [phone]

## 2019-01-30 LAB — BASIC METABOLIC PANEL
BUN: 18 mg/dL (ref 6–23)
CO2: 26 mEq/L (ref 19–32)
Calcium: 11.2 mg/dL — ABNORMAL HIGH (ref 8.4–10.5)
Chloride: 102 mEq/L (ref 96–112)
Creatinine, Ser: 0.92 mg/dL (ref 0.40–1.20)
GFR: 60.43 mL/min (ref >60.00)
Glucose, Bld: 100 mg/dL — ABNORMAL HIGH (ref 70–99)
Potassium: 4.6 mEq/L (ref 3.5–5.1)
Sodium: 136 mEq/L (ref 135–145)

## 2019-01-30 LAB — LITHIUM LEVEL: Lithium Lvl: 0.6 mmol/L (ref 0.6–1.2)

## 2019-01-31 ENCOUNTER — Other Ambulatory Visit: Payer: Self-pay | Admitting: Psychiatry

## 2019-01-31 ENCOUNTER — Telehealth: Payer: Self-pay | Admitting: Psychiatry

## 2019-01-31 ENCOUNTER — Other Ambulatory Visit: Payer: Self-pay

## 2019-01-31 MED ORDER — LITHIUM CARBONATE 150 MG PO CAPS: 750.0000 mg | ORAL_CAPSULE | Freq: Every day | ORAL | 0 refills | Status: DC

## 2019-01-31 MED ORDER — LITHIUM CARBONATE 150 MG PO CAPS: 600.0000 mg | ORAL_CAPSULE | Freq: Every day | ORAL | 0 refills | Status: DC

## 2019-01-31 NOTE — Telephone Encounter (Signed)
Updated Rx for lithium 150 mg 5 q hs submitted for 90 day.

## 2019-01-31 NOTE — Telephone Encounter (Signed)
Are you wanting her Rx to say for only 20 days? Just clarifying, pharmacy contacted me

## 2019-01-31 NOTE — Telephone Encounter (Signed)
I do not have time to call her discussed the diagnosis on her MyChart discharge summary between visits.  I will discuss it with her at her next visit.

## 2019-01-31 NOTE — Telephone Encounter (Signed)
had not noticed a 20-day limit.  I will fix it and send it in again

## 2019-01-31 NOTE — Telephone Encounter (Signed)
Jessica [name] called to report that the pharmacy keeps filling her Lithium for the wrong quantity. Please be sure they know the quantity has changed.  She said it was to be 150mg  5 qHS.for total of 750mg .  She also asked if she could use up her lithium XR 300mg  she had gotten previously.  [facility] [address].  Next appt  11/4  She also wanted someone to discuss with someone  the diagnosis of of Binge Eating that was listed as addressed on her last visit summary.  She had called about this before but no one has explained this issue with her.  Please call

## 2019-01-31 NOTE — Telephone Encounter (Signed)
We can leave it set for 5 capsules daily.  But it was actually supposed to be 4 of the 150 mg capsules as of the last note.  Patient is easily confused and agitated so we will leave the prescription written as is 5 capsules daily until her next appointment and then we will address it with the patient in person.

## 2019-01-31 NOTE — Telephone Encounter (Signed)
I will send it, you still sent it wrong. Suppose to be 5 at hs. Not 4.

## 2019-02-01 DIAGNOSIS — I110 Hypertensive heart disease with heart failure: Secondary | ICD-10-CM | POA: Diagnosis not present

## 2019-02-01 DIAGNOSIS — J9621 Acute and chronic respiratory failure with hypoxia: Secondary | ICD-10-CM | POA: Diagnosis not present

## 2019-02-01 DIAGNOSIS — G4733 Obstructive sleep apnea (adult) (pediatric): Secondary | ICD-10-CM | POA: Diagnosis not present

## 2019-02-01 DIAGNOSIS — J9622 Acute and chronic respiratory failure with hypercapnia: Secondary | ICD-10-CM | POA: Diagnosis not present

## 2019-02-01 DIAGNOSIS — I5033 Acute on chronic diastolic (congestive) heart failure: Secondary | ICD-10-CM | POA: Diagnosis not present

## 2019-02-01 DIAGNOSIS — J449 Chronic obstructive pulmonary disease, unspecified: Secondary | ICD-10-CM | POA: Diagnosis not present

## 2019-02-01 NOTE — Telephone Encounter (Signed)
OK to use lithium XR 300 mg 2 daily in place of other lithium 300 mg capsules.  She shouldn't notice any differences in how she feels.

## 2019-02-01 NOTE — Telephone Encounter (Signed)
Pt. Made aware and verbalized understanding.

## 2019-02-02 ENCOUNTER — Telehealth: Payer: Self-pay | Admitting: *Deleted

## 2019-02-02 NOTE — Telephone Encounter (Signed)
Spoke to patient . Appointment  Schedule for  02/08/19  At 3 pm

## 2019-02-02 NOTE — Telephone Encounter (Signed)
Sharon:  I can work her in next week if she wants. -W

## 2019-02-02 NOTE — Telephone Encounter (Signed)
Spoke to patient - patient was upset  That she has not heard from office  To schedule  [facility] APPT  And follow appt with Dr Audie Box -   RN spoke to patient- informed patient  - RN spoke to patient  Earlier this week an schedule an appointment with an extender  02/08/19 as requested by Dr Audie Box and Dr Loanne Drilling.  patient did not mention an another appointment is needed. RN Informed patient tere was 3  Voicemail message left for her to call back  To schedule - 9/14 ,9/17 and 9/28. Patient wanted to know which phone number was used . RN INFORMED HER THE MOBILE PHONE.  PATIENT request to use home phone to left messages the mobile phone drops message.    patient states she is still having swelling her feet and legs  , she has not weighed herself  or checked blood pressure .  She has been taking 40 mg lasix  Daily since  Office visit 01/02/19 except for the 3 days after visit was increased   [facility] HAS NOT BEEN SCHEDULE YET THE  NEXT OPENING WILL BE  IN 2 WEEKS - PATIENT HAS UPCOMING  WITH EXTENDER 02/08/19   Aware will defer to Dr Audie Box

## 2019-02-02 NOTE — Telephone Encounter (Signed)
-----   Message from Geralynn Rile, MD sent at 01/29/2019  4:02 PM EDT ----- Regarding: RE: sooner appt Can we get Mrs. Teresi in for an APP visit? 1-2 weeks should be fine.   Evalina Field, MD ----- Message ----- From: Amado Coe, RN Sent: 01/29/2019   3:59 PM EDT To: Geralynn Rile, MD, Chi Rodman Pickle, MD Subject: sooner appt                                    Good afternoon,  Dr. Rodman Pickle is wanting to know if this patient be seen sooner that her scheduled appointment. She has questions regarding her Lithium and her Lasix.  IF you team could reach out to patient. That would be greatly appreciated.   Thank you Ashley Akin RN

## 2019-02-02 NOTE — Telephone Encounter (Signed)
LEFT MESSAGE TO CALL  TO BACK - -IF UNABLE TO KEEP NEW APPOINTMENT  ON 02/05/19   WITH DR O'NEAL.

## 2019-02-04 NOTE — Progress Notes (Signed)
Cardiology Office Note:   Date:  02/05/2019  NAME:  Jessica [name]    MRN: [id number] DOB:  01/18/1951   PCP:  Alroy Dust, L.Marlou Sa, MD  Cardiologist:  Evalina Field, MD  Electrophysiologist:  None   Referring MD: Aurea Graff.Marlou Sa, MD   Chief Complaint  Patient presents with  . Leg Swelling   History of Present Illness:   Jessica [name] is a 68 y.o. female with a hx of morbid obesity, obesity hypoventilation, OSA, overactive bladder, hypothyroidism, depression who presents for follow-up of LE edema.  She was seen a few months ago in our clinic for evaluation of right heart failure/heart failure with preserved ejection fraction.  She reports since leaving our office she is gained 2 to 3 pounds which appears to be fluid in the past 2 weeks.  Per review of her records, she has increased her weight up 11 pounds to 297 since August where she was 286 pounds.  She has remained on Lasix 40 mg daily, but this does not appear to be reducing the fluid.  She also reports she is made an attempt to reduce her salt intake, but is still consuming a good amount of salt.  Her husband and her both report they have purchased salt substitutes.  She reports she needs guidance with sodium restriction as well as fluid restriction.  She also inquires about taking water pills with her lithium.  She reports she still is mainly living around the house, and very inactive.  She also is elevating her legs at night.  She remains on home oxygen, and has concerns with her home pulse oximeter.  She denies symptoms of chest pain, trouble breathing, palpitations during her examination.  She does inquire about stopping her metoprolol.  Past Medical History: Past Medical History:  Diagnosis Date  . Arthritis    lower back, hands, knees  . Bipolar disorder ([place])   . Cellulitis and abscess of left leg 11/29/2018  . Depression   . Dyspnea    with exertion  . GERD (gastroesophageal reflux disease)    patient unsure about this  dx - no meds  . Headache   . History of IBS    watches diet  . Hyperlipidemia   . Hypertension   . Hypothyroidism   . Obese   . Plantar fasciitis   . Seasonal allergies   . Sleep apnea 2017   uses CPAP   . Spinal stenosis of lumbar region   . SVD (spontaneous vaginal delivery)    x 2  . Systemic lupus ([place])   . Thyroid disease     Past Surgical History: Past Surgical History:  Procedure Laterality Date  . CARPAL TUNNEL RELEASE Left 09/16/2017   Procedure: LEFT CARPAL TUNNEL RELEASE;  Surgeon: Daryll Brod, MD;  Location: [facility];  Service: Orthopedics;  Laterality: Left;  . carpel tunnel surgery Right   . CATARACT EXTRACTION Bilateral 2007   w/ lens implants  . COLONOSCOPY    . DILATION AND CURETTAGE OF UTERUS    . EYE SURGERY    . FOOT SURGERY Right    hammer toe  . FRACTURE SURGERY     R tibia and fibula  . LEG SURGERY Right   . TONSILLECTOMY    . WISDOM TOOTH EXTRACTION      Current Medications: Current Meds  Medication Sig  . acetaminophen (TYLENOL) 650 MG CR tablet Take 650 mg by mouth every 8 (eight) hours as needed for pain.  . Acetylcysteine  600 MG CAPS Take 600 mg by mouth 2 (two) times daily. NAC  . albuterol (PROVENTIL) (2.5 MG/3ML) 0.083% nebulizer solution Take 3 mLs (2.5 mg total) by nebulization 2 (two) times daily as needed for wheezing or shortness of breath.  Marland Kitchen albuterol (VENTOLIN HFA) 108 (90 Base) MCG/ACT inhaler Inhale 2 puffs into the lungs every 6 (six) hours as needed for wheezing or shortness of breath.  Marland Kitchen amLODipine (NORVASC) 10 MG tablet Take 1 tablet (10 mg total) by mouth daily.  . cycloSPORINE (RESTASIS) 0.05 % ophthalmic emulsion Place 1 drop into both eyes 2 (two) times daily.  . DULoxetine (CYMBALTA) 60 MG capsule Take 1 capsule (60 mg total) by mouth daily.  Marland Kitchen econazole nitrate 1 % cream Apply 1 application topically 3 (three) times daily as needed (fungus).   . fluticasone (FLONASE) 50 MCG/ACT nasal spray Place 1 spray into both  nostrils daily.  Marland Kitchen gabapentin (NEURONTIN) 300 MG capsule Take 1 capsule (300 mg total) by mouth at bedtime.  Marland Kitchen ketoconazole (NIZORAL) 2 % cream Apply 1 application topically 3 (three) times daily as needed for irritation.  Marland Kitchen levothyroxine (SYNTHROID) 25 MCG tablet Take 225 mcg by mouth daily before breakfast.   . lisdexamfetamine (VYVANSE) 60 MG capsule Take 1 capsule (60 mg total) by mouth every morning.  . lithium carbonate 150 MG capsule Take 5 capsules (750 mg total) by mouth at bedtime.  Marland Kitchen LORazepam (ATIVAN) 0.5 MG tablet Take 1 tablet (0.5 mg total) by mouth 2 (two) times daily as needed for anxiety.  . metoprolol tartrate (LOPRESSOR) 25 MG tablet Take 0.5 tablets (12.5 mg total) by mouth 2 (two) times daily.  . Multiple Vitamins-Minerals (MULTIVITAMIN WITH MINERALS) tablet Take 1 tablet by mouth daily.  . Multiple Vitamins-Minerals (PRESERVISION AREDS 2) CAPS Take 1 capsule 2 (two) times daily by mouth.  . potassium chloride SA (K-DUR) 20 MEQ tablet Take 1 tablet (20 mEq total) by mouth daily.  . pravastatin (PRAVACHOL) 20 MG tablet Take 20 mg at bedtime by mouth.   . SYNTHROID 200 MCG tablet TAKE 1 TABLET BY MOUTH EVERY MORNING ON AN EMPTY STOMACH  . Thiamine HCl (VITAMIN B1) 100 MG TABS Take 1 tablet by mouth daily.  . vitamin B-12 (CYANOCOBALAMIN) 100 MCG tablet Take 100 mcg by mouth 2 (two) times daily.  . Vitamin D, Ergocalciferol, (DRISDOL) 1.25 MG (50000 UT) CAPS capsule Take 50,000 Units by mouth every 7 (seven) days.  . [DISCONTINUED] furosemide (LASIX) 40 MG tablet Take 1 tablet (40 mg total) by mouth daily.     Allergies:    Ibuprofen, Codeine, Nabumetone, Promethazine, Amoxicillin-pot clavulanate, Erythromycin base, and Promethazine-codeine   Social History: Social History   Socioeconomic History  . Marital status: Married    Spouse name: Not on file  . Number of children: Not on file  . Years of education: Not on file  . Highest education level: Not on file   Occupational History  . Not on file  Social Needs  . Financial resource strain: Not on file  . Food insecurity    Worry: Not on file    Inability: Not on file  . Transportation needs    Medical: Not on file    Non-medical: Not on file  Tobacco Use  . Smoking status: Never Smoker  . Smokeless tobacco: Never Used  Substance and Sexual Activity  . Alcohol use: Yes    Comment: rarely  . Drug use: Never  . Sexual activity: Not on file  Lifestyle  .  Physical activity    Days per week: Not on file    Minutes per session: Not on file  . Stress: Not on file  Relationships  . Social Herbalist on phone: Not on file    Gets together: Not on file    Attends religious service: Not on file    Active member of club or organization: Not on file    Attends meetings of clubs or organizations: Not on file    Relationship status: Not on file  Other Topics Concern  . Not on file  Social History Narrative   She lives in a single level home with her husband.  She has two grown children.   She taught college accounting courses, retired in 2005.    Highest level of education:  Doctorate in accounting     Family History: The patient's family history includes Bipolar disorder in her father; Breast cancer in her mother; Dementia in her father; Diabetes in her father; Heart disease in her father and mother; Thyroid disease in her mother.  ROS:   All other ROS reviewed and negative. Pertinent positives noted in the HPI.     EKGs/Labs/Other Studies Reviewed:   The following studies were personally reviewed by me today:  EKG:  EKG is ordered today.  The ekg ordered today demonstrates normal sinus rhythm, heart rate 88, no acute ST-T changes, poor R wave progression noted, and was personally reviewed by me.   Recent Labs: 12/06/2018: B Natriuretic Peptide 82.5 12/09/2018: ALT 21 12/10/2018: Magnesium 2.5 12/11/2018: Hemoglobin 14.4; Platelets 337 01/29/2019: BUN 18; Creatinine, Ser 0.92;  Potassium 4.6; Sodium 136   Recent Lipid Panel    Component Value Date/Time   CHOL 173 01/23/2013 1105   TRIG 271 (H) 01/23/2013 1105   HDL 51 01/23/2013 1105   CHOLHDL 3.4 01/23/2013 1105   VLDL 54 (H) 01/23/2013 1105   LDLCALC 68 01/23/2013 1105    Physical Exam:   VS:  BP (!) 150/83   Pulse 88   Ht 5\' 2"  (1.575 m)   Wt 297 lb 6.4 oz (134.9 kg)   SpO2 100%   BMI 54.40 kg/m    Wt Readings from Last 3 Encounters:  02/05/19 297 lb 6.4 oz (134.9 kg)  01/29/19 296 lb 12.8 oz (134.6 kg)  01/25/19 295 lb (133.8 kg)    General: Obese female no acute distress Heart: Atraumatic, normal size  Eyes: PEERLA, EOMI  Neck: Supple, no JVD Endocrine: No thryomegaly Cardiac: Normal S1, S2; RRR; no murmurs, rubs, or gallops Lungs: Clear to auscultation bilaterally, no wheezing, rhonchi or rales  Abd: Soft, nontender, no hepatomegaly  Ext: 2+ lower extremity edema up to the knees Musculoskeletal: No deformities, BUE and BLE strength normal and equal Skin: Venous insufficiency changes noted on examination of the lower extremities Neuro: Alert and oriented to person, place, time, and situation, CNII-XII grossly intact, no focal deficits  Psych: Normal mood and affect   ASSESSMENT:   NAME@ is a 68 y.o. female who presents for the following: 1. Chronic diastolic heart failure ([place])   2. RVF (right ventricular failure) ([place])   3. Essential hypertension   4. Mixed hyperlipidemia   5. Obesity, morbid, BMI 50 or higher ([place])     PLAN:   1. Chronic diastolic heart failure ([place]) 2. RVF (right ventricular failure) ([place]) -She presents with worsening lower extremity edema.  Her lungs are clear, and she appears to have more right heart failure symptoms.  I  suspect this is all related to her OSA/obesity hypoventilation syndrome -We will switch her to torsemide 40 mg daily and continue her potassium 20 mEq supplement daily -I will arrange for her to come back in 1 week in our APP clinic to assess  her volume status -I am hopeful this which will keep her out of the hospital but we will have to see  3. Essential hypertension -Blood pressure a bit up today and I suspect this is volume related -She expresses a desire to stop her metoprolol and we will do so today -We will continue her Norvasc 10 mg daily  4. Mixed hyperlipidemia -Her LDL surprisingly is around 54 which is impressive  5. Morbid obesity >50 BMI -Counseled on the importance of weight loss and exercise   Disposition: Return in about 3 months (around 05/08/2019).  Medication Adjustments/Labs and Tests Ordered: Current medicines are reviewed at length with the patient today.  Concerns regarding medicines are outlined above.  Orders Placed This Encounter  Procedures  . EKG 12-Lead   Meds ordered this encounter  Medications  . torsemide (DEMADEX) 20 MG tablet    Sig: Take 1 tablet (20 mg total) by mouth daily.    Dispense:  90 tablet    Refill:  3    Discontinue furosemide    Patient Instructions  Medication Instructions:   Stop taking lasix  -start taking torsemide ( Demadex) 40 mg daily  Continue taking potassium ( klor con)  20 meq daily    If you need a refill on your cardiac medications before your next appointment, please call your pharmacy.   Lab work:  Not needed  Testing/Procedures: Not needed  Follow-Up: At [facility], you and your health needs are our priority.  As part of our continuing mission to provide you with exceptional heart care, we have created designated Provider Care Teams.  These Care Teams include your primary Cardiologist (physician) and Advanced Practice Providers (APPs -  Physician Assistants and Nurse Practitioners) who all work together to provide you with the care you need, when you need it. . Dr Audie Box recommends that you schedule a follow-up appointment in 3 month -  . Your physician recommends that you schedule a follow-up appointment in: 1 week with extender- in  office. .      Any Other Special Instructions Will Be Listed Below (If Applicable).    Signed, Addison Naegeli. Audie Box, [place]  [address] [place], [place] [address] [phone]  02/05/2019 4:39 PM

## 2019-02-05 ENCOUNTER — Ambulatory Visit (INDEPENDENT_AMBULATORY_CARE_PROVIDER_SITE_OTHER): Payer: Medicare Other | Admitting: Cardiovascular Disease

## 2019-02-05 ENCOUNTER — Other Ambulatory Visit: Payer: Self-pay

## 2019-02-05 ENCOUNTER — Encounter: Payer: Self-pay | Admitting: Cardiovascular Disease

## 2019-02-05 VITALS — BP 150/83 | HR 88 | Ht 62.0 in | Wt 297.4 lb

## 2019-02-05 DIAGNOSIS — I50810 Right heart failure, unspecified: Secondary | ICD-10-CM | POA: Diagnosis not present

## 2019-02-05 DIAGNOSIS — I5032 Chronic diastolic (congestive) heart failure: Secondary | ICD-10-CM | POA: Diagnosis not present

## 2019-02-05 DIAGNOSIS — E782 Mixed hyperlipidemia: Secondary | ICD-10-CM | POA: Diagnosis not present

## 2019-02-05 DIAGNOSIS — I10 Essential (primary) hypertension: Secondary | ICD-10-CM

## 2019-02-05 MED ORDER — TORSEMIDE 20 MG PO TABS: 20.0000 mg | ORAL_TABLET | Freq: Every day | ORAL | 3 refills | Status: DC

## 2019-02-05 NOTE — Patient Instructions (Addendum)
Medication Instructions:   Stop taking lasix  -start taking torsemide ( Demadex) 40 mg daily  Continue taking potassium ( klor con)  20 meq daily    If you need a refill on your cardiac medications before your next appointment, please call your pharmacy.   Lab work:  Not needed  Testing/Procedures: Not needed  Follow-Up: At [facility], you and your health needs are our priority.  As part of our continuing mission to provide you with exceptional heart care, we have created designated Provider Care Teams.  These Care Teams include your primary Cardiologist (physician) and Advanced Practice Providers (APPs -  Physician Assistants and Nurse Practitioners) who all work together to provide you with the care you need, when you need it. . Dr Audie Box recommends that you schedule a follow-up appointment in 3 month -  . Your physician recommends that you schedule a follow-up appointment in: 1 week with extender- in office. .      Any Other Special Instructions Will Be Listed Below (If Applicable).

## 2019-02-06 ENCOUNTER — Telehealth: Payer: Self-pay | Admitting: *Deleted

## 2019-02-06 ENCOUNTER — Telehealth: Payer: Self-pay | Admitting: Psychiatry

## 2019-02-06 DIAGNOSIS — M329 Systemic lupus erythematosus, unspecified: Secondary | ICD-10-CM

## 2019-02-06 DIAGNOSIS — M25571 Pain in right ankle and joints of right foot: Secondary | ICD-10-CM | POA: Diagnosis not present

## 2019-02-06 DIAGNOSIS — Z23 Encounter for immunization: Secondary | ICD-10-CM | POA: Diagnosis not present

## 2019-02-06 DIAGNOSIS — M545 Low back pain: Secondary | ICD-10-CM | POA: Diagnosis not present

## 2019-02-06 NOTE — Telephone Encounter (Signed)
It is safer than most of the other diuretics with lithium.  She can take it as directed.

## 2019-02-06 NOTE — Telephone Encounter (Signed)
-----   Message from [place], MD sent at 02/05/2019  5:21 PM EDT ----- Regarding: Refer to Pain Clinic Please send referral to Pain Clinic with Dr. Clydell Hakim at [facility]. Fax number [phone].  JE

## 2019-02-06 NOTE — Telephone Encounter (Signed)
She was Rxed this med,  Torsemide 20mg   (Demadex) by her heart MD as a diuretic. She read that there could be interaction with lithium,and wants your opinion.

## 2019-02-07 DIAGNOSIS — J449 Chronic obstructive pulmonary disease, unspecified: Secondary | ICD-10-CM | POA: Diagnosis not present

## 2019-02-07 DIAGNOSIS — G4733 Obstructive sleep apnea (adult) (pediatric): Secondary | ICD-10-CM | POA: Diagnosis not present

## 2019-02-07 DIAGNOSIS — I110 Hypertensive heart disease with heart failure: Secondary | ICD-10-CM | POA: Diagnosis not present

## 2019-02-07 DIAGNOSIS — J9621 Acute and chronic respiratory failure with hypoxia: Secondary | ICD-10-CM | POA: Diagnosis not present

## 2019-02-07 DIAGNOSIS — I5033 Acute on chronic diastolic (congestive) heart failure: Secondary | ICD-10-CM | POA: Diagnosis not present

## 2019-02-07 DIAGNOSIS — J9622 Acute and chronic respiratory failure with hypercapnia: Secondary | ICD-10-CM | POA: Diagnosis not present

## 2019-02-07 NOTE — Telephone Encounter (Signed)
Left detailed information and to call back with further questions or concerns

## 2019-02-08 ENCOUNTER — Other Ambulatory Visit: Payer: Self-pay | Admitting: Family Medicine

## 2019-02-08 ENCOUNTER — Ambulatory Visit
Admission: RE | Admit: 2019-02-08 | Discharge: 2019-02-08 | Disposition: A | Discharge status: Home / Self Care | Payer: Medicare Other | Source: Ambulatory Visit | Attending: Family Medicine | Admitting: Family Medicine

## 2019-02-08 ENCOUNTER — Ambulatory Visit: Payer: Medicare Other | Admitting: Medical

## 2019-02-08 DIAGNOSIS — M19071 Primary osteoarthritis, right ankle and foot: Secondary | ICD-10-CM | POA: Diagnosis not present

## 2019-02-08 DIAGNOSIS — M25571 Pain in right ankle and joints of right foot: Secondary | ICD-10-CM

## 2019-02-11 NOTE — Progress Notes (Signed)
Cardiology Office Note   Date:  02/12/2019   ID:  Jessica [name], DOB 22-May-1950, MRN [id number]  PCP:  Aurea Graff.Marlou Sa, MD  Cardiologist: Dr. Audie Box No chief complaint on file.    History of Present Illness: Jessica [name] is a 68 y.o. female who presents for ongoing assessment and management of lower extremity edema, chronic right heart failure with preserved EF.  Other history includes morbid obesity, hypoventilation syndrome, OSA, hypothyroidism, overactive bladder, and depression.  Was last seen in the office on 02/05/2019 by Dr. Audie Box, and was found to be volume overloaded.  It was suspected that this was related to her OSA/obesity hypoventilation syndrome.  She was switched to torsemide 40 mg daily with potassium supplement 20 mEq daily.  She was again instructed on a low-sodium diet and daily weights.  She was noted to be slightly hypertensive.  Metoprolol was discontinued at her request, it was felt her blood pressure was related to volume overload.  She was continued on amlodipine.  [name] comes today with multiple questions and complaints.  She has had good response from the Lasix having less lower extremity edema and lost 6 pounds.  She continues to wear oxygen via nasal cannula.  She is still having a hard time with a low-sodium diet.  She states she is able to walk further distances without being short of breath now but continues to have some lower extremity edema.  Past Medical History:  Diagnosis Date  . Arthritis    lower back, hands, knees  . Bipolar disorder ([place])   . Cellulitis and abscess of left leg 11/29/2018  . Depression   . Dyspnea    with exertion  . GERD (gastroesophageal reflux disease)    patient unsure about this dx - no meds  . Headache   . History of IBS    watches diet  . Hyperlipidemia   . Hypertension   . Hypothyroidism   . Obese   . Plantar fasciitis   . Seasonal allergies   . Sleep apnea 2017   uses CPAP   . Spinal stenosis  of lumbar region   . SVD (spontaneous vaginal delivery)    x 2  . Systemic lupus ([place])   . Thyroid disease     Past Surgical History:  Procedure Laterality Date  . CARPAL TUNNEL RELEASE Left 09/16/2017   Procedure: LEFT CARPAL TUNNEL RELEASE;  Surgeon: Daryll Brod, MD;  Location: [facility];  Service: Orthopedics;  Laterality: Left;  . carpel tunnel surgery Right   . CATARACT EXTRACTION Bilateral 2007   w/ lens implants  . COLONOSCOPY    . DILATION AND CURETTAGE OF UTERUS    . EYE SURGERY    . FOOT SURGERY Right    hammer toe  . FRACTURE SURGERY     R tibia and fibula  . LEG SURGERY Right   . TONSILLECTOMY    . WISDOM TOOTH EXTRACTION       Current Outpatient Medications  Medication Sig Dispense Refill  . acetaminophen (TYLENOL) 650 MG CR tablet Take 650 mg by mouth every 8 (eight) hours as needed for pain.    . Acetylcysteine 600 MG CAPS Take 600 mg by mouth 2 (two) times daily. NAC    . albuterol (PROVENTIL) (2.5 MG/3ML) 0.083% nebulizer solution Take 3 mLs (2.5 mg total) by nebulization 2 (two) times daily as needed for wheezing or shortness of breath. 75 mL 0  . albuterol (VENTOLIN HFA) 108 (90 Base) MCG/ACT inhaler Inhale  2 puffs into the lungs every 6 (six) hours as needed for wheezing or shortness of breath. 8 g 4  . amLODipine (NORVASC) 10 MG tablet Take 1 tablet (10 mg total) by mouth daily. 60 tablet 0  . cycloSPORINE (RESTASIS) 0.05 % ophthalmic emulsion Place 1 drop into both eyes 2 (two) times daily.    . DULoxetine (CYMBALTA) 60 MG capsule Take 1 capsule (60 mg total) by mouth daily. 90 capsule 0  . econazole nitrate 1 % cream Apply 1 application topically 3 (three) times daily as needed (fungus).     . fluticasone (FLONASE) 50 MCG/ACT nasal spray Place 1 spray into both nostrils daily.    Marland Kitchen ketoconazole (NIZORAL) 2 % cream Apply 1 application topically 3 (three) times daily as needed for irritation.    Marland Kitchen levothyroxine (SYNTHROID) 25 MCG tablet Take 225 mcg by mouth  daily before breakfast.     . lisdexamfetamine (VYVANSE) 60 MG capsule Take 1 capsule (60 mg total) by mouth every morning. 30 capsule 0  . lithium carbonate 150 MG capsule Take 5 capsules (750 mg total) by mouth at bedtime. (Patient taking differently: Take 600 mg by mouth at bedtime. ) 450 capsule 0  . LORazepam (ATIVAN) 0.5 MG tablet Take 1 tablet (0.5 mg total) by mouth 2 (two) times daily as needed for anxiety. 10 tablet 0  . metoprolol tartrate (LOPRESSOR) 25 MG tablet Take 0.5 tablets (12.5 mg total) by mouth 2 (two) times daily. 60 tablet 0  . Multiple Vitamins-Minerals (MULTIVITAMIN WITH MINERALS) tablet Take 1 tablet by mouth daily.    . Multiple Vitamins-Minerals (PRESERVISION AREDS 2) CAPS Take 1 capsule 2 (two) times daily by mouth.    . pravastatin (PRAVACHOL) 20 MG tablet Take 20 mg at bedtime by mouth.     . SYNTHROID 200 MCG tablet TAKE 1 TABLET BY MOUTH EVERY MORNING ON AN EMPTY STOMACH    . Thiamine HCl (VITAMIN B1) 100 MG TABS Take 1 tablet by mouth daily.    Marland Kitchen torsemide (DEMADEX) 20 MG tablet Take 1 tablet (20 mg total) by mouth daily. 90 tablet 3  . vitamin B-12 (CYANOCOBALAMIN) 100 MCG tablet Take 100 mcg by mouth 2 (two) times daily.    . Vitamin D, Ergocalciferol, (DRISDOL) 1.25 MG (50000 UT) CAPS capsule Take 50,000 Units by mouth every 7 (seven) days.    Marland Kitchen gabapentin (NEURONTIN) 300 MG capsule Take 1 capsule (300 mg total) by mouth at bedtime. 30 capsule 0  . potassium chloride SA (K-DUR) 20 MEQ tablet Take 1 tablet (20 mEq total) by mouth daily. 30 tablet 0   No current facility-administered medications for this visit.     Allergies:   Ibuprofen, Codeine, Nabumetone, Promethazine, Amoxicillin-pot clavulanate, Erythromycin base, and Promethazine-codeine    Social History:  The patient  reports that she has never smoked. She has never used smokeless tobacco. She reports current alcohol use. She reports that she does not use drugs.   Family History:  The patient's  family history includes Bipolar disorder in her father; Breast cancer in her mother; Dementia in her father; Diabetes in her father; Heart disease in her father and mother; Thyroid disease in her mother.    ROS: All other systems are reviewed and negative. Unless otherwise mentioned in H&P    PHYSICAL EXAM: VS:  BP 136/87   Pulse (!) 101   Ht 5\' 2"  (1.575 m)   Wt 291 lb (132 kg)   SpO2 98%   BMI 53.22 kg/m  ,  BMI Body mass index is 53.22 kg/m. GEN: Well nourished, well developed, in no acute distress HEENT: normal Neck: no JVD, carotid bruits, or masses Cardiac: RRR; no murmurs, rubs, or gallops, 1+ to 2+ dependent edema  Respiratory:  Clear to auscultation bilaterally, wearing oxygen via nasal cannula, short of breath when talking for long periods of time. GI: soft, nontender, nondistended, + BS MS: no deformity or atrophy Skin: warm and dry, no rash Neuro:  Strength and sensation are intact Psych: euthymic mood, full affect   EKG: Not completed this office visit  Recent Labs: 12/06/2018: B Natriuretic Peptide 82.5 12/09/2018: ALT 21 12/10/2018: Magnesium 2.5 12/11/2018: Hemoglobin 14.4; Platelets 337 01/29/2019: BUN 18; Creatinine, Ser 0.92; Potassium 4.6; Sodium 136    Lipid Panel    Component Value Date/Time   CHOL 173 01/23/2013 1105   TRIG 271 (H) 01/23/2013 1105   HDL 51 01/23/2013 1105   CHOLHDL 3.4 01/23/2013 1105   VLDL 54 (H) 01/23/2013 1105   LDLCALC 68 01/23/2013 1105      Wt Readings from Last 3 Encounters:  02/12/19 291 lb (132 kg)  02/05/19 297 lb 6.4 oz (134.9 kg)  01/29/19 296 lb 12.8 oz (134.6 kg)      Other studies Reviewed: Echocardiogram 12/08/18 1. The left ventricle has normal systolic function with an ejection fraction of 60-65%. The cavity size was normal. There is mild concentric left ventricular hypertrophy. Left ventricular diastolic Doppler parameters are consistent with impaired  relaxation. Indeterminate filling pressures.  2. The  right ventricle has mildly reduced systolic function. The cavity was mildly enlarged. There is no increase in right ventricular wall thickness.  3. The aortic valve is tricuspid. Moderate aortic annular calcification noted.  4. The mitral valve is grossly normal. There is mild mitral annular calcification present.  5. The tricuspid valve is grossly normal.  6. The aorta is normal in size and structure.    ASSESSMENT AND PLAN:  1.  Chronic diastolic heart failure: She has diuresed approximately 6 pounds with higher dose of Lasix.  I will continue to keep her on this dose.  She will have a follow-up BMET today.  She is instructed on a low-sodium diet, multiple questions have been answered concerning diastolic heart failure and dependent edema.  She is given written instructions on low-sodium diet.  2.  Hypertension: Much better controlled with diuresis.  Continue amlodipine, metoprolol,  3.  O2 dependent COPD: The patient desaturates easily when she removes her oxygen going down into the 80s.  She states that she sometimes forgets to wear her oxygen and begins to feel bad.  I have asked her not to delay wearing oxygen when she changes from a tank to a home machine concentrator.  She verbalizes understanding.  4.  Morbid obesity: She does have a good bit of dependent edema, likely related to obesity.  She is advised on increasing her activity as much as possible.  She states she is beginning to walk longer distances without shortness of breath.  She states she will need more of a portable oxygen tank to allow her to go longer distances.  She is advised to follow-up with PCP or pulmonology for changes in prescription for oxygen.   Current medicines are reviewed at length with the patient today.  I have spent over 40 minutes with this patient today answering multiple questions and concerns and providing explanation.  Labs/ tests ordered today include: BMET  Phill Myron. [place], ANP, AACC    02/12/2019  [place] [address] Office [phone] Fax [phone]  Notice: This dictation was prepared with Dragon dictation along with smaller phrase technology. Any transcriptional errors that result from this process are unintentional and may not be corrected upon review.

## 2019-02-12 ENCOUNTER — Other Ambulatory Visit: Payer: Self-pay

## 2019-02-12 ENCOUNTER — Ambulatory Visit (INDEPENDENT_AMBULATORY_CARE_PROVIDER_SITE_OTHER): Payer: Medicare Other | Admitting: Adult Health

## 2019-02-12 ENCOUNTER — Encounter: Payer: Self-pay | Admitting: Adult Health

## 2019-02-12 VITALS — BP 136/87 | HR 101 | Ht 62.0 in | Wt 291.0 lb

## 2019-02-12 DIAGNOSIS — I10 Essential (primary) hypertension: Secondary | ICD-10-CM | POA: Diagnosis not present

## 2019-02-12 DIAGNOSIS — J9621 Acute and chronic respiratory failure with hypoxia: Secondary | ICD-10-CM | POA: Diagnosis not present

## 2019-02-12 DIAGNOSIS — G4733 Obstructive sleep apnea (adult) (pediatric): Secondary | ICD-10-CM | POA: Diagnosis not present

## 2019-02-12 DIAGNOSIS — I5032 Chronic diastolic (congestive) heart failure: Secondary | ICD-10-CM | POA: Diagnosis not present

## 2019-02-12 DIAGNOSIS — Z9981 Dependence on supplemental oxygen: Secondary | ICD-10-CM

## 2019-02-12 DIAGNOSIS — I5033 Acute on chronic diastolic (congestive) heart failure: Secondary | ICD-10-CM | POA: Diagnosis not present

## 2019-02-12 DIAGNOSIS — Z79899 Other long term (current) drug therapy: Secondary | ICD-10-CM

## 2019-02-12 DIAGNOSIS — R0602 Shortness of breath: Secondary | ICD-10-CM

## 2019-02-12 DIAGNOSIS — I110 Hypertensive heart disease with heart failure: Secondary | ICD-10-CM | POA: Diagnosis not present

## 2019-02-12 DIAGNOSIS — J449 Chronic obstructive pulmonary disease, unspecified: Secondary | ICD-10-CM | POA: Diagnosis not present

## 2019-02-12 DIAGNOSIS — J9622 Acute and chronic respiratory failure with hypercapnia: Secondary | ICD-10-CM | POA: Diagnosis not present

## 2019-02-12 NOTE — Patient Instructions (Signed)
Medication Instructions:  Continue current medications  If you need a refill on your cardiac medications before your next appointment, please call your pharmacy.  Labwork: BMP Today HERE IN OUR OFFICE AT [facility]  You will NOT need to fast   Take the provided lab slips with you to the lab for your blood draw.   When you have your labs (blood work) drawn today and your tests are completely normal, you will receive your results only by MyChart Message (if you have MyChart) -OR-  A paper copy in the mail.  If you have any lab test that is abnormal or we need to change your treatment, we will call you to review these results.  Testing/Procedures: None Ordered  Follow-Up: You will need a follow up appointment in 3 months.  Please call our office 2 months in advance to schedule this appointment.  You may see Evalina Field, MD or one of the following Advanced Practice Providers on your designated Care Team:   Kerin Ransom, PA-C Roby Lofts, [place] . Sande Rives, PA-C     At [facility], you and your health needs are our priority.  As part of our continuing mission to provide you with exceptional heart care, we have created designated Provider Care Teams.  These Care Teams include your primary Cardiologist (physician) and Advanced Practice Providers (APPs -  Physician Assistants and Nurse Practitioners) who all work together to provide you with the care you need, when you need it.  Thank you for choosing [facility] at [facility]!!

## 2019-02-13 LAB — BASIC METABOLIC PANEL
BUN/Creatinine Ratio: 20 (ref 12–28)
BUN: 25 mg/dL (ref 8–27)
CO2: 25 mmol/L (ref 20–29)
Calcium: 11.1 mg/dL — ABNORMAL HIGH (ref 8.7–10.3)
Chloride: 101 mmol/L (ref 96–106)
Creatinine, Ser: 1.27 mg/dL — ABNORMAL HIGH (ref 0.57–1.00)
GFR calc Af Amer: 50 mL/min/{1.73_m2} — ABNORMAL LOW (ref >59)
GFR calc non Af Amer: 43 mL/min/{1.73_m2} — ABNORMAL LOW (ref >59)
Glucose: 109 mg/dL — ABNORMAL HIGH (ref 65–99)
Potassium: 5 mmol/L (ref 3.5–5.2)
Sodium: 140 mmol/L (ref 134–144)

## 2019-02-14 DIAGNOSIS — Z885 Allergy status to narcotic agent status: Secondary | ICD-10-CM | POA: Diagnosis not present

## 2019-02-14 DIAGNOSIS — Z886 Allergy status to analgesic agent status: Secondary | ICD-10-CM | POA: Diagnosis not present

## 2019-02-14 DIAGNOSIS — Z888 Allergy status to other drugs, medicaments and biological substances status: Secondary | ICD-10-CM | POA: Diagnosis not present

## 2019-02-14 DIAGNOSIS — Z9989 Dependence on other enabling machines and devices: Secondary | ICD-10-CM | POA: Diagnosis not present

## 2019-02-14 DIAGNOSIS — G4733 Obstructive sleep apnea (adult) (pediatric): Secondary | ICD-10-CM | POA: Diagnosis not present

## 2019-02-14 DIAGNOSIS — Z880 Allergy status to penicillin: Secondary | ICD-10-CM | POA: Diagnosis not present

## 2019-02-14 DIAGNOSIS — Z881 Allergy status to other antibiotic agents status: Secondary | ICD-10-CM | POA: Diagnosis not present

## 2019-02-14 DIAGNOSIS — I5020 Unspecified systolic (congestive) heart failure: Secondary | ICD-10-CM | POA: Diagnosis not present

## 2019-02-14 DIAGNOSIS — E669 Obesity, unspecified: Secondary | ICD-10-CM | POA: Diagnosis not present

## 2019-02-16 DIAGNOSIS — J9621 Acute and chronic respiratory failure with hypoxia: Secondary | ICD-10-CM | POA: Diagnosis not present

## 2019-02-16 DIAGNOSIS — J449 Chronic obstructive pulmonary disease, unspecified: Secondary | ICD-10-CM | POA: Diagnosis not present

## 2019-02-16 DIAGNOSIS — G4733 Obstructive sleep apnea (adult) (pediatric): Secondary | ICD-10-CM | POA: Diagnosis not present

## 2019-02-16 DIAGNOSIS — I110 Hypertensive heart disease with heart failure: Secondary | ICD-10-CM | POA: Diagnosis not present

## 2019-02-16 DIAGNOSIS — I5033 Acute on chronic diastolic (congestive) heart failure: Secondary | ICD-10-CM | POA: Diagnosis not present

## 2019-02-16 DIAGNOSIS — J9622 Acute and chronic respiratory failure with hypercapnia: Secondary | ICD-10-CM | POA: Diagnosis not present

## 2019-02-17 DIAGNOSIS — I872 Venous insufficiency (chronic) (peripheral): Secondary | ICD-10-CM | POA: Diagnosis not present

## 2019-02-17 DIAGNOSIS — M48061 Spinal stenosis, lumbar region without neurogenic claudication: Secondary | ICD-10-CM | POA: Diagnosis not present

## 2019-02-17 DIAGNOSIS — I5033 Acute on chronic diastolic (congestive) heart failure: Secondary | ICD-10-CM | POA: Diagnosis not present

## 2019-02-17 DIAGNOSIS — F419 Anxiety disorder, unspecified: Secondary | ICD-10-CM | POA: Diagnosis not present

## 2019-02-17 DIAGNOSIS — E785 Hyperlipidemia, unspecified: Secondary | ICD-10-CM | POA: Diagnosis not present

## 2019-02-17 DIAGNOSIS — I110 Hypertensive heart disease with heart failure: Secondary | ICD-10-CM | POA: Diagnosis not present

## 2019-02-17 DIAGNOSIS — J9612 Chronic respiratory failure with hypercapnia: Secondary | ICD-10-CM | POA: Diagnosis not present

## 2019-02-17 DIAGNOSIS — E039 Hypothyroidism, unspecified: Secondary | ICD-10-CM | POA: Diagnosis not present

## 2019-02-17 DIAGNOSIS — M150 Primary generalized (osteo)arthritis: Secondary | ICD-10-CM | POA: Diagnosis not present

## 2019-02-17 DIAGNOSIS — G4733 Obstructive sleep apnea (adult) (pediatric): Secondary | ICD-10-CM | POA: Diagnosis not present

## 2019-02-17 DIAGNOSIS — F319 Bipolar disorder, unspecified: Secondary | ICD-10-CM | POA: Diagnosis not present

## 2019-02-17 DIAGNOSIS — J9611 Chronic respiratory failure with hypoxia: Secondary | ICD-10-CM | POA: Diagnosis not present

## 2019-02-17 DIAGNOSIS — Z9981 Dependence on supplemental oxygen: Secondary | ICD-10-CM | POA: Diagnosis not present

## 2019-02-17 DIAGNOSIS — Z6841 Body Mass Index (BMI) 40.0 and over, adult: Secondary | ICD-10-CM | POA: Diagnosis not present

## 2019-02-17 DIAGNOSIS — M329 Systemic lupus erythematosus, unspecified: Secondary | ICD-10-CM | POA: Diagnosis not present

## 2019-02-17 DIAGNOSIS — J449 Chronic obstructive pulmonary disease, unspecified: Secondary | ICD-10-CM | POA: Diagnosis not present

## 2019-02-19 ENCOUNTER — Telehealth: Payer: Self-pay | Admitting: *Deleted

## 2019-02-19 DIAGNOSIS — M79671 Pain in right foot: Secondary | ICD-10-CM | POA: Diagnosis not present

## 2019-02-19 DIAGNOSIS — M25571 Pain in right ankle and joints of right foot: Secondary | ICD-10-CM | POA: Diagnosis not present

## 2019-02-19 DIAGNOSIS — Z79899 Other long term (current) drug therapy: Secondary | ICD-10-CM

## 2019-02-19 NOTE — Telephone Encounter (Signed)
Pt aware of her blood work, and medications changes, BMP ordered and will be mailed to pt to get labs in 1 Month

## 2019-02-19 NOTE — Telephone Encounter (Signed)
-----   Message from Lendon Colonel, NP sent at 02/13/2019  6:48 AM EDT ----- Labs reviewed. Decrease torsemide to 40 mg alternating with 20 mg every other day. Repeat BMET in one month.

## 2019-02-20 DIAGNOSIS — I110 Hypertensive heart disease with heart failure: Secondary | ICD-10-CM | POA: Diagnosis not present

## 2019-02-20 DIAGNOSIS — G4733 Obstructive sleep apnea (adult) (pediatric): Secondary | ICD-10-CM | POA: Diagnosis not present

## 2019-02-20 DIAGNOSIS — J9611 Chronic respiratory failure with hypoxia: Secondary | ICD-10-CM | POA: Diagnosis not present

## 2019-02-20 DIAGNOSIS — J9612 Chronic respiratory failure with hypercapnia: Secondary | ICD-10-CM | POA: Diagnosis not present

## 2019-02-20 DIAGNOSIS — I5033 Acute on chronic diastolic (congestive) heart failure: Secondary | ICD-10-CM | POA: Diagnosis not present

## 2019-02-20 DIAGNOSIS — J449 Chronic obstructive pulmonary disease, unspecified: Secondary | ICD-10-CM | POA: Diagnosis not present

## 2019-02-21 DIAGNOSIS — I5033 Acute on chronic diastolic (congestive) heart failure: Secondary | ICD-10-CM | POA: Diagnosis not present

## 2019-02-21 DIAGNOSIS — J449 Chronic obstructive pulmonary disease, unspecified: Secondary | ICD-10-CM | POA: Diagnosis not present

## 2019-02-21 DIAGNOSIS — J9611 Chronic respiratory failure with hypoxia: Secondary | ICD-10-CM | POA: Diagnosis not present

## 2019-02-21 DIAGNOSIS — I110 Hypertensive heart disease with heart failure: Secondary | ICD-10-CM | POA: Diagnosis not present

## 2019-02-21 DIAGNOSIS — J9612 Chronic respiratory failure with hypercapnia: Secondary | ICD-10-CM | POA: Diagnosis not present

## 2019-02-21 DIAGNOSIS — G4733 Obstructive sleep apnea (adult) (pediatric): Secondary | ICD-10-CM | POA: Diagnosis not present

## 2019-02-28 DIAGNOSIS — G4733 Obstructive sleep apnea (adult) (pediatric): Secondary | ICD-10-CM | POA: Diagnosis not present

## 2019-02-28 DIAGNOSIS — I5033 Acute on chronic diastolic (congestive) heart failure: Secondary | ICD-10-CM | POA: Diagnosis not present

## 2019-02-28 DIAGNOSIS — I110 Hypertensive heart disease with heart failure: Secondary | ICD-10-CM | POA: Diagnosis not present

## 2019-02-28 DIAGNOSIS — J9611 Chronic respiratory failure with hypoxia: Secondary | ICD-10-CM | POA: Diagnosis not present

## 2019-02-28 DIAGNOSIS — J9612 Chronic respiratory failure with hypercapnia: Secondary | ICD-10-CM | POA: Diagnosis not present

## 2019-02-28 DIAGNOSIS — J449 Chronic obstructive pulmonary disease, unspecified: Secondary | ICD-10-CM | POA: Diagnosis not present

## 2019-03-04 DIAGNOSIS — L03113 Cellulitis of right upper limb: Secondary | ICD-10-CM | POA: Diagnosis not present

## 2019-03-05 DIAGNOSIS — I5033 Acute on chronic diastolic (congestive) heart failure: Secondary | ICD-10-CM | POA: Diagnosis not present

## 2019-03-05 DIAGNOSIS — I110 Hypertensive heart disease with heart failure: Secondary | ICD-10-CM | POA: Diagnosis not present

## 2019-03-05 DIAGNOSIS — J9611 Chronic respiratory failure with hypoxia: Secondary | ICD-10-CM | POA: Diagnosis not present

## 2019-03-05 DIAGNOSIS — J449 Chronic obstructive pulmonary disease, unspecified: Secondary | ICD-10-CM | POA: Diagnosis not present

## 2019-03-05 DIAGNOSIS — J9612 Chronic respiratory failure with hypercapnia: Secondary | ICD-10-CM | POA: Diagnosis not present

## 2019-03-05 DIAGNOSIS — G4733 Obstructive sleep apnea (adult) (pediatric): Secondary | ICD-10-CM | POA: Diagnosis not present

## 2019-03-06 DIAGNOSIS — H04123 Dry eye syndrome of bilateral lacrimal glands: Secondary | ICD-10-CM | POA: Diagnosis not present

## 2019-03-06 DIAGNOSIS — L03113 Cellulitis of right upper limb: Secondary | ICD-10-CM | POA: Diagnosis not present

## 2019-03-06 DIAGNOSIS — G629 Polyneuropathy, unspecified: Secondary | ICD-10-CM | POA: Diagnosis not present

## 2019-03-06 LAB — PULMONARY FUNCTION TEST
FEF 25-75 Pre: 1.41 L/sec
FEF2575-%Pred-Pre: 76 %
FEV1-%Pred-Pre: 28 %
FEV1-Pre: 0.61 L
FEV1FVC-%Pred-Pre: 131 %
FEV6-%Pred-Pre: 22 %
FEV6-Pre: 0.61 L
FEV6FVC-%Pred-Pre: 104 %
FVC-%Pred-Pre: 21 %
FVC-Pre: 0.61 L
Pre FEV1/FVC ratio: 100 %
Pre FEV6/FVC Ratio: 100 %

## 2019-03-07 ENCOUNTER — Encounter: Payer: Self-pay | Admitting: Psychiatry

## 2019-03-07 ENCOUNTER — Other Ambulatory Visit: Payer: Self-pay

## 2019-03-07 ENCOUNTER — Ambulatory Visit (INDEPENDENT_AMBULATORY_CARE_PROVIDER_SITE_OTHER): Payer: Medicare Other | Admitting: Psychiatry

## 2019-03-07 VITALS — BP 134/73 | HR 92

## 2019-03-07 DIAGNOSIS — F319 Bipolar disorder, unspecified: Secondary | ICD-10-CM | POA: Diagnosis not present

## 2019-03-07 DIAGNOSIS — F900 Attention-deficit hyperactivity disorder, predominantly inattentive type: Secondary | ICD-10-CM

## 2019-03-07 DIAGNOSIS — F411 Generalized anxiety disorder: Secondary | ICD-10-CM | POA: Diagnosis not present

## 2019-03-07 DIAGNOSIS — G4733 Obstructive sleep apnea (adult) (pediatric): Secondary | ICD-10-CM

## 2019-03-07 NOTE — Patient Instructions (Signed)
Take lithium 4 of the 150 mg capsules

## 2019-03-07 NOTE — Progress Notes (Signed)
MERINDA DEEKEN [id number] [birth year] 68 y.o.  Subjective:   Patient ID:  Jessica [name] is a 68 y.o. (DOB 03/20/1951) female.  Chief Complaint:  Chief Complaint  Patient presents with  . Follow-up    Medication Management  . Other    Bipolar 1    HPI  Jessica [name] presents to the office today for follow-up of bipolar disorder.  Last seen April 2020.Marland Kitchen No meds changed.  Seen August and was to switch to sertraline but for pain reasons another doctor suggested she stay on duloxetine.    Health better with reduced salt and loss  Of 32# with diuretic.  Aches with less pain med.  Awaken 2-3 times and some EMA at times.  Nocturia is a contributor.    Mood variable from OK to lonely and perturbed over the isolation.  Covid has a negative effect on her mood. Recognizes she gets angry and impatient and directed at husband.   Has had a spell of irritability for days.  Upset she can't drive now DT sleepiness with OSA.   Current depression not severe.  Sleep poor with EMA.  Lies in bed a lot.  Is using CPAP.  Her doctor at [facility] says it's the worst case she's ever seen.  Not driving per the sleep doctor.   Naps and broken sleep.  Gabapentin helps bladder pain. No concerns about meds.  Past Psychiatric Medication Trials: Abilify, Seroquel 600, Depakote, Trileptal, olanzapine,, ziprasidone, venlafaxine, lamotrigine 200,  Ritalin, Lunesta, lithium, lorazepam, Nuvigil, Vyvanse, Ritalin, citalopram, Wellbutrin,  Ambien,  and others  Review of Systems:  Review of Systems  Constitutional: Positive for fatigue.  Respiratory: Positive for shortness of breath.   Genitourinary: Positive for frequency, pelvic pain and urgency.  Musculoskeletal: Positive for arthralgias, back pain and gait problem.       Uses walker  Neurological: Positive for weakness. Negative for tremors.  Psychiatric/Behavioral: Positive for dysphoric mood. Negative for agitation, behavioral problems, confusion,  decreased concentration, hallucinations, self-injury, sleep disturbance and suicidal ideas. The patient is not nervous/anxious and is not hyperactive.     Medications: I have reviewed the patient's current medications.  Current Outpatient Medications  Medication Sig Dispense Refill  . acetaminophen (TYLENOL) 650 MG CR tablet Take 650 mg by mouth every 8 (eight) hours as needed for pain.    . Acetylcysteine 600 MG CAPS Take 600 mg by mouth 2 (two) times daily. NAC    . albuterol (PROVENTIL) (2.5 MG/3ML) 0.083% nebulizer solution Take 3 mLs (2.5 mg total) by nebulization 2 (two) times daily as needed for wheezing or shortness of breath. 75 mL 0  . albuterol (VENTOLIN HFA) 108 (90 Base) MCG/ACT inhaler Inhale 2 puffs into the lungs every 6 (six) hours as needed for wheezing or shortness of breath. 8 g 4  . amLODipine (NORVASC) 10 MG tablet Take 1 tablet (10 mg total) by mouth daily. 60 tablet 0  . Cephalexin (KEFLEX PO) Take by mouth 2 (two) times daily.    . cycloSPORINE (RESTASIS) 0.05 % ophthalmic emulsion Place 1 drop into both eyes 2 (two) times daily.    . DULoxetine (CYMBALTA) 60 MG capsule Take 1 capsule (60 mg total) by mouth daily. 90 capsule 0  . econazole nitrate 1 % cream Apply 1 application topically 3 (three) times daily as needed (fungus).     . fluticasone (FLONASE) 50 MCG/ACT nasal spray Place 1 spray into both nostrils daily.    Marland Kitchen gabapentin (NEURONTIN) 300 MG capsule Take  1 capsule (300 mg total) by mouth at bedtime. (Patient taking differently: Take 300 mg by mouth at bedtime. 1 in the morning and 2 at HS.) 30 capsule 0  . ketoconazole (NIZORAL) 2 % cream Apply 1 application topically 3 (three) times daily as needed for irritation.    Marland Kitchen levothyroxine (SYNTHROID) 25 MCG tablet Take 225 mcg by mouth daily before breakfast.     . lisdexamfetamine (VYVANSE) 60 MG capsule Take 1 capsule (60 mg total) by mouth every morning. 30 capsule 0  . lithium carbonate 150 MG capsule Take 5  capsules (750 mg total) by mouth at bedtime. (Patient taking differently: Take 150 mg by mouth at bedtime. ) 450 capsule 0  . LORazepam (ATIVAN) 0.5 MG tablet Take 1 tablet (0.5 mg total) by mouth 2 (two) times daily as needed for anxiety. 10 tablet 0  . metoprolol tartrate (LOPRESSOR) 25 MG tablet Take 0.5 tablets (12.5 mg total) by mouth 2 (two) times daily. 60 tablet 0  . Multiple Vitamins-Minerals (MULTIVITAMIN WITH MINERALS) tablet Take 1 tablet by mouth daily.    . Multiple Vitamins-Minerals (PRESERVISION AREDS 2) CAPS Take 1 capsule 2 (two) times daily by mouth.    . potassium chloride SA (K-DUR) 20 MEQ tablet Take 1 tablet (20 mEq total) by mouth daily. 30 tablet 0  . pravastatin (PRAVACHOL) 20 MG tablet Take 20 mg at bedtime by mouth.     . SYNTHROID 200 MCG tablet TAKE 1 TABLET BY MOUTH EVERY MORNING ON AN EMPTY STOMACH    . Thiamine HCl (VITAMIN B1) 100 MG TABS Take 1 tablet by mouth daily.    Marland Kitchen torsemide (DEMADEX) 20 MG tablet Take 1 tablet (20 mg total) by mouth daily. (Patient taking differently: Take 20 mg by mouth daily. Alternate from 20 Mg to 40 Mg every other day.) 90 tablet 3  . vitamin B-12 (CYANOCOBALAMIN) 100 MCG tablet Take 100 mcg by mouth 2 (two) times daily.    . Vitamin D, Ergocalciferol, (DRISDOL) 1.25 MG (50000 UT) CAPS capsule Take 50,000 Units by mouth every 7 (seven) days.     No current facility-administered medications for this visit.     Medication Side Effects: Other: ? sleepiness unlikely related.  Allergies:  Allergies  Allergen Reactions  . Ibuprofen Diarrhea, Nausea Only and Other (See Comments)    Extreme stomach pain  Makes her feel bad  . Codeine     UNSPECIFIED REACTION   . Nabumetone     UNSPECIFIED REACTION   . Promethazine     UNSPECIFIED REACTION   . Amoxicillin-Pot Clavulanate Nausea And Vomiting  . Erythromycin Base Diarrhea  . Promethazine-Codeine Other (See Comments)    "Makes her feel bad"    Past Medical History:  Diagnosis  Date  . Arthritis    lower back, hands, knees  . Bipolar disorder ([place])   . Cellulitis and abscess of left leg 11/29/2018  . Depression   . Dyspnea    with exertion  . GERD (gastroesophageal reflux disease)    patient unsure about this dx - no meds  . Headache   . History of IBS    watches diet  . Hyperlipidemia   . Hypertension   . Hypothyroidism   . Obese   . Plantar fasciitis   . Seasonal allergies   . Sleep apnea 2017   uses CPAP   . Spinal stenosis of lumbar region   . SVD (spontaneous vaginal delivery)    x 2  . Systemic lupus (  [place])   . Thyroid disease     Family History  Problem Relation Age of Onset  . Thyroid disease Mother   . Breast cancer Mother   . Heart disease Mother   . Bipolar disorder Father   . Diabetes Father   . Heart disease Father   . Dementia Father     Social History   Socioeconomic History  . Marital status: Married    Spouse name: Not on file  . Number of children: Not on file  . Years of education: Not on file  . Highest education level: Not on file  Occupational History  . Not on file  Social Needs  . Financial resource strain: Not on file  . Food insecurity    Worry: Not on file    Inability: Not on file  . Transportation needs    Medical: Not on file    Non-medical: Not on file  Tobacco Use  . Smoking status: Never Smoker  . Smokeless tobacco: Never Used  Substance and Sexual Activity  . Alcohol use: Yes    Comment: rarely  . Drug use: Never  . Sexual activity: Not on file  Lifestyle  . Physical activity    Days per week: Not on file    Minutes per session: Not on file  . Stress: Not on file  Relationships  . Social Herbalist on phone: Not on file    Gets together: Not on file    Attends religious service: Not on file    Active member of club or organization: Not on file    Attends meetings of clubs or organizations: Not on file    Relationship status: Not on file  . Intimate partner violence     Fear of current or ex partner: Not on file    Emotionally abused: Not on file    Physically abused: Not on file    Forced sexual activity: Not on file  Other Topics Concern  . Not on file  Social History Narrative   She lives in a single level home with her husband.  She has two grown children.   She taught college accounting courses, retired in 2005.    Highest level of education:  Doctorate in accounting    Past Medical History, Surgical history, Social history, and Family history were reviewed and updated as appropriate.   5 gkids 15 to 7 mos.  Please see review of systems for further details on the patient's review from today.   Objective:   Physical Exam:  BP 134/73   Pulse 92   Physical Exam Constitutional:      General: She is not in acute distress.    Appearance: She is well-developed. She is obese.  Musculoskeletal:        General: No deformity.  Neurological:     Mental Status: She is alert and oriented to person, place, and time.     Cranial Nerves: No dysarthria.     Motor: Weakness present.     Coordination: Coordination abnormal.     Gait: Gait abnormal.  Psychiatric:        Attention and Perception: Attention and perception normal. She does not perceive auditory or visual hallucinations.        Mood and Affect: Mood is anxious. Mood is not depressed or elated. Affect is not labile, blunt, angry, tearful or inappropriate.        Speech: Speech is not rapid and pressured or slurred.  Behavior: Behavior normal. Behavior is cooperative.        Thought Content: Thought content normal. Thought content is not paranoid or delusional. Thought content does not include homicidal or suicidal ideation. Thought content does not include homicidal or suicidal plan.        Cognition and Memory: Cognition and memory normal.        Judgment: Judgment normal.     Comments: Insight fair. Tendency to irritability apparently ongoing. Talkative.and speech clearer. Using  walker and on O2.     Lab Review:     Component Value Date/Time   NA 140 02/12/2019 1536   K 5.0 02/12/2019 1536   CL 101 02/12/2019 1536   CO2 25 02/12/2019 1536   GLUCOSE 109 (H) 02/12/2019 1536   GLUCOSE 100 (H) 01/29/2019 1656   BUN 25 02/12/2019 1536   CREATININE 1.27 (H) 02/12/2019 1536   CREATININE 0.90 05/15/2018 1118   CALCIUM 11.1 (H) 02/12/2019 1536   PROT 6.9 12/09/2018 0543   ALBUMIN 3.8 12/09/2018 0543   AST 29 12/09/2018 0543   ALT 21 12/09/2018 0543   ALKPHOS 105 12/09/2018 0543   BILITOT 0.5 12/09/2018 0543   GFRNONAA 43 (L) 02/12/2019 1536   GFRNONAA 66 05/15/2018 1118   GFRAA 50 (L) 02/12/2019 1536   GFRAA 76 05/15/2018 1118       Component Value Date/Time   WBC 16.7 (H) 12/11/2018 0753   RBC 4.62 12/11/2018 0753   HGB 14.4 12/11/2018 0753   HCT 47.6 (H) 12/11/2018 0753   PLT 337 12/11/2018 0753   MCV 103.0 (H) 12/11/2018 0753   MCH 31.2 12/11/2018 0753   MCHC 30.3 12/11/2018 0753   RDW 13.3 12/11/2018 0753   LYMPHSABS 2.0 12/11/2018 0753   MONOABS 1.7 (H) 12/11/2018 0753   EOSABS 0.5 12/11/2018 0753   BASOSABS 0.1 12/11/2018 0753    Lithium Lvl  Date Value Ref Range Status  01/29/2019 0.6 0.6 - 1.2 mmol/L Final    Lithium 1.0 on 04/06/18 and other labs that date.   No results found for: PHENYTOIN, PHENOBARB, VALPROATE, CBMZ   .res Assessment: Plan:    Bipolar I disorder ([place])  Attention deficit hyperactivity disorder (ADHD), predominantly inattentive type  Obstructive sleep apnea  Generalized anxiety disorder  Very severe.  Mild Cognitive Impairment stable and appears better with O2  Emphasized the importance of CPAP as she struggels with the treatment.  Talked about the effect of OSA on the brain.    Lithium level is good.  BMP stable except watch hypercalcemia.  She has not done adequately well with alternative mood stabilizers.  Disc lab tests.  Disc need to monitor hypercalcemia.  Counseled patient regarding potential  benefits, risks, and side effects of lithium to include potential risk of lithium affecting thyroid and renal function.  Discussed need for periodic lab monitoring to determine drug level and to assess for potential adverse effects.  Counseled patient regarding signs and symptoms of lithium toxicity and advised that they notify office immediately or seek urgent medical attention if experiencing these signs and symptoms.  Patient advised to contact office with any questions or concerns.  Lithium level OK generally at 1.14.  On 750 mg daily.  At the last appointment we reduced it to 600 mg daily.  She is not 100% sure that is the dose she is taking but will verify with her husband.   She is having no current symptoms of lithium toxicity.  Disc irritability that she acknowledges. Option [place]  for irritability and anger outbursts if necessary .  She wants to defer.  There is a question about whether her insurance would cover it.  Also she does not really want to add another medication because she takes so many already. Asks about anger management.  We discussed these techniques.  Continue duloxetine 60.   Disc risk of mood cycling.    Discussed potential benefits, risks, and side effects of stimulants with patient to include increased heart rate, palpitations, insomnia, increased anxiety, increased irritability, or decreased appetite.  Instructed patient to contact office if experiencing any significant tolerability issues.  I do not believe that the stimulant is causing irritability.  This appt was 30 mins.   FU 3 mos  Lynder Parents, MD, DFAPA   Please see After Visit Summary for patient specific instructions.  Future Appointments  Date Time Provider Faith  07/19/2019  1:30 PM Bo Merino, MD [facility] None    No orders of the defined types were placed in this encounter.     -------------------------------

## 2019-03-09 DIAGNOSIS — I110 Hypertensive heart disease with heart failure: Secondary | ICD-10-CM | POA: Diagnosis not present

## 2019-03-09 DIAGNOSIS — J9612 Chronic respiratory failure with hypercapnia: Secondary | ICD-10-CM | POA: Diagnosis not present

## 2019-03-09 DIAGNOSIS — J449 Chronic obstructive pulmonary disease, unspecified: Secondary | ICD-10-CM | POA: Diagnosis not present

## 2019-03-09 DIAGNOSIS — J9611 Chronic respiratory failure with hypoxia: Secondary | ICD-10-CM | POA: Diagnosis not present

## 2019-03-09 DIAGNOSIS — G4733 Obstructive sleep apnea (adult) (pediatric): Secondary | ICD-10-CM | POA: Diagnosis not present

## 2019-03-09 DIAGNOSIS — I5033 Acute on chronic diastolic (congestive) heart failure: Secondary | ICD-10-CM | POA: Diagnosis not present

## 2019-03-14 DIAGNOSIS — J9611 Chronic respiratory failure with hypoxia: Secondary | ICD-10-CM | POA: Diagnosis not present

## 2019-03-14 DIAGNOSIS — I5033 Acute on chronic diastolic (congestive) heart failure: Secondary | ICD-10-CM | POA: Diagnosis not present

## 2019-03-14 DIAGNOSIS — G4733 Obstructive sleep apnea (adult) (pediatric): Secondary | ICD-10-CM | POA: Diagnosis not present

## 2019-03-14 DIAGNOSIS — J449 Chronic obstructive pulmonary disease, unspecified: Secondary | ICD-10-CM | POA: Diagnosis not present

## 2019-03-14 DIAGNOSIS — J9612 Chronic respiratory failure with hypercapnia: Secondary | ICD-10-CM | POA: Diagnosis not present

## 2019-03-14 DIAGNOSIS — I110 Hypertensive heart disease with heart failure: Secondary | ICD-10-CM | POA: Diagnosis not present

## 2019-03-19 DIAGNOSIS — E039 Hypothyroidism, unspecified: Secondary | ICD-10-CM | POA: Diagnosis not present

## 2019-03-19 DIAGNOSIS — G4733 Obstructive sleep apnea (adult) (pediatric): Secondary | ICD-10-CM | POA: Diagnosis not present

## 2019-03-19 DIAGNOSIS — E785 Hyperlipidemia, unspecified: Secondary | ICD-10-CM | POA: Diagnosis not present

## 2019-03-19 DIAGNOSIS — M48061 Spinal stenosis, lumbar region without neurogenic claudication: Secondary | ICD-10-CM | POA: Diagnosis not present

## 2019-03-19 DIAGNOSIS — M329 Systemic lupus erythematosus, unspecified: Secondary | ICD-10-CM | POA: Diagnosis not present

## 2019-03-19 DIAGNOSIS — J9611 Chronic respiratory failure with hypoxia: Secondary | ICD-10-CM | POA: Diagnosis not present

## 2019-03-19 DIAGNOSIS — J449 Chronic obstructive pulmonary disease, unspecified: Secondary | ICD-10-CM | POA: Diagnosis not present

## 2019-03-19 DIAGNOSIS — J9612 Chronic respiratory failure with hypercapnia: Secondary | ICD-10-CM | POA: Diagnosis not present

## 2019-03-19 DIAGNOSIS — M150 Primary generalized (osteo)arthritis: Secondary | ICD-10-CM | POA: Diagnosis not present

## 2019-03-19 DIAGNOSIS — Z6841 Body Mass Index (BMI) 40.0 and over, adult: Secondary | ICD-10-CM | POA: Diagnosis not present

## 2019-03-19 DIAGNOSIS — Z9981 Dependence on supplemental oxygen: Secondary | ICD-10-CM | POA: Diagnosis not present

## 2019-03-19 DIAGNOSIS — F319 Bipolar disorder, unspecified: Secondary | ICD-10-CM | POA: Diagnosis not present

## 2019-03-19 DIAGNOSIS — F419 Anxiety disorder, unspecified: Secondary | ICD-10-CM | POA: Diagnosis not present

## 2019-03-19 DIAGNOSIS — I872 Venous insufficiency (chronic) (peripheral): Secondary | ICD-10-CM | POA: Diagnosis not present

## 2019-03-19 DIAGNOSIS — I110 Hypertensive heart disease with heart failure: Secondary | ICD-10-CM | POA: Diagnosis not present

## 2019-03-19 DIAGNOSIS — I5033 Acute on chronic diastolic (congestive) heart failure: Secondary | ICD-10-CM | POA: Diagnosis not present

## 2019-04-02 DIAGNOSIS — G4733 Obstructive sleep apnea (adult) (pediatric): Secondary | ICD-10-CM | POA: Diagnosis not present

## 2019-04-02 DIAGNOSIS — J9611 Chronic respiratory failure with hypoxia: Secondary | ICD-10-CM | POA: Diagnosis not present

## 2019-04-02 DIAGNOSIS — J449 Chronic obstructive pulmonary disease, unspecified: Secondary | ICD-10-CM | POA: Diagnosis not present

## 2019-04-02 DIAGNOSIS — I5033 Acute on chronic diastolic (congestive) heart failure: Secondary | ICD-10-CM | POA: Diagnosis not present

## 2019-04-02 DIAGNOSIS — J9612 Chronic respiratory failure with hypercapnia: Secondary | ICD-10-CM | POA: Diagnosis not present

## 2019-04-02 DIAGNOSIS — I110 Hypertensive heart disease with heart failure: Secondary | ICD-10-CM | POA: Diagnosis not present

## 2019-04-09 ENCOUNTER — Encounter: Payer: Self-pay | Admitting: Pulmonary Disease

## 2019-04-09 ENCOUNTER — Other Ambulatory Visit: Payer: Self-pay

## 2019-04-09 ENCOUNTER — Ambulatory Visit (INDEPENDENT_AMBULATORY_CARE_PROVIDER_SITE_OTHER): Payer: Medicare Other | Admitting: Pulmonary Disease

## 2019-04-09 VITALS — BP 108/78 | HR 99 | Temp 97.2°F | Ht 62.0 in | Wt 285.6 lb

## 2019-04-09 DIAGNOSIS — G4733 Obstructive sleep apnea (adult) (pediatric): Secondary | ICD-10-CM | POA: Diagnosis not present

## 2019-04-09 DIAGNOSIS — J9611 Chronic respiratory failure with hypoxia: Secondary | ICD-10-CM

## 2019-04-09 DIAGNOSIS — Z9989 Dependence on other enabling machines and devices: Secondary | ICD-10-CM | POA: Diagnosis not present

## 2019-04-09 DIAGNOSIS — I5032 Chronic diastolic (congestive) heart failure: Secondary | ICD-10-CM | POA: Diagnosis not present

## 2019-04-09 NOTE — Patient Instructions (Addendum)
Chronic diastolic heart failure CONTINUE diuretics as prescribed as your cardiologist  Chronic hypoxemic respiratory failure secondary heart failure - Improved  YOU DO NOT HAVE COPD. Normal pulmonary function tests.  CONTINUE supplemental oxygen to maintain levels for greater 88%. We will order portable oxygen CONTINUE Albuterol as needed  Obstructive sleep apnea Scheduled for PAP titration with BiPAP with follow-up scheduled with Dr. Ronnie Derby office  Follow-up with me in 6 months for check-in

## 2019-04-09 NOTE — Progress Notes (Signed)
Subjective:   PATIENT ID: Jessica [name] GENDER: female DOB: 1950-10-20, MRN: [id number]   HPI  Chief Complaint  Patient presents with  . Follow-up    wants to know if she needs her o2 with exertion   Reason for Visit: Follow-up   [name] is a 68 year old female never smoker with OSA on CPAP, spinal stenosis and lupus who presents for follow-up for shortness of breath.  Since our last visit in 01/29/19, she reports her breathing is overall better. Her cardiologist changed her diuretics to torsemide and has lost 12lbs in the last two months. She monitors her weight daily. She has been watching her oxygen closely as well and noting that her resting oxygen is >90% and only using oxygen when she exerts herself and seeing values start dropping closer to 87-88%. She is working on being more active by sweeping and raking her front porch which she is able to do for 30-60 minutes. She does not require her albuterol. She is scheduled for sleep titration with her Sleep doctor at [facility], The next month.  Social History: Never smoker  I have personally reviewed patient's past medical/family/social history/allergies/current medications.  Past Medical History:  Diagnosis Date  . Arthritis    lower back, hands, knees  . Bipolar disorder ([place])   . Cellulitis and abscess of left leg 11/29/2018  . Depression   . Dyspnea    with exertion  . GERD (gastroesophageal reflux disease)    patient unsure about this dx - no meds  . Headache   . History of IBS    watches diet  . Hyperlipidemia   . Hypertension   . Hypothyroidism   . Obese   . Plantar fasciitis   . Seasonal allergies   . Sleep apnea 2017   uses CPAP   . Spinal stenosis of lumbar region   . SVD (spontaneous vaginal delivery)    x 2  . Systemic lupus ([place])   . Thyroid disease      Family History  Problem Relation Age of Onset  . Thyroid disease Mother   . Breast cancer Mother   . Heart disease Mother   .  Bipolar disorder Father   . Diabetes Father   . Heart disease Father   . Dementia Father      Social History   Occupational History  . Not on file  Tobacco Use  . Smoking status: Never Smoker  . Smokeless tobacco: Never Used  Substance and Sexual Activity  . Alcohol use: Yes    Comment: rarely  . Drug use: Never  . Sexual activity: Not on file    Allergies  Allergen Reactions  . Ibuprofen Diarrhea, Nausea Only and Other (See Comments)    Extreme stomach pain  Makes her feel bad  . Codeine     UNSPECIFIED REACTION   . Nabumetone     UNSPECIFIED REACTION   . Promethazine     UNSPECIFIED REACTION   . Amoxicillin-Pot Clavulanate Nausea And Vomiting  . Erythromycin Base Diarrhea  . Promethazine-Codeine Other (See Comments)    "Makes her feel bad"     Outpatient Medications Prior to Visit  Medication Sig Dispense Refill  . acetaminophen (TYLENOL) 650 MG CR tablet Take 650 mg by mouth every 8 (eight) hours as needed for pain.    . Acetylcysteine 600 MG CAPS Take 600 mg by mouth 2 (two) times daily. NAC    . albuterol (PROVENTIL) (2.5 MG/3ML) 0.083%  nebulizer solution Take 3 mLs (2.5 mg total) by nebulization 2 (two) times daily as needed for wheezing or shortness of breath. 75 mL 0  . albuterol (VENTOLIN HFA) 108 (90 Base) MCG/ACT inhaler Inhale 2 puffs into the lungs every 6 (six) hours as needed for wheezing or shortness of breath. 8 g 4  . amLODipine (NORVASC) 10 MG tablet Take 1 tablet (10 mg total) by mouth daily. 60 tablet 0  . Cephalexin (KEFLEX PO) Take by mouth 2 (two) times daily.    . cycloSPORINE (RESTASIS) 0.05 % ophthalmic emulsion Place 1 drop into both eyes 2 (two) times daily.    . DULoxetine (CYMBALTA) 60 MG capsule Take 1 capsule (60 mg total) by mouth daily. 90 capsule 0  . econazole nitrate 1 % cream Apply 1 application topically 3 (three) times daily as needed (fungus).     . fluticasone (FLONASE) 50 MCG/ACT nasal spray Place 1 spray into both nostrils  daily.    Marland Kitchen gabapentin (NEURONTIN) 300 MG capsule Take 1 capsule (300 mg total) by mouth at bedtime. (Patient taking differently: Take 300 mg by mouth at bedtime. 1 in the morning and 2 at HS.) 30 capsule 0  . ketoconazole (NIZORAL) 2 % cream Apply 1 application topically 3 (three) times daily as needed for irritation.    Marland Kitchen levothyroxine (SYNTHROID) 25 MCG tablet Take 225 mcg by mouth daily before breakfast.     . lisdexamfetamine (VYVANSE) 60 MG capsule Take 1 capsule (60 mg total) by mouth every morning. 30 capsule 0  . lithium carbonate 150 MG capsule Take 5 capsules (750 mg total) by mouth at bedtime. (Patient taking differently: Take 600 mg by mouth at bedtime. ) 450 capsule 0  . LORazepam (ATIVAN) 0.5 MG tablet Take 1 tablet (0.5 mg total) by mouth 2 (two) times daily as needed for anxiety. 10 tablet 0  . metoprolol tartrate (LOPRESSOR) 25 MG tablet Take 0.5 tablets (12.5 mg total) by mouth 2 (two) times daily. 60 tablet 0  . Multiple Vitamins-Minerals (MULTIVITAMIN WITH MINERALS) tablet Take 1 tablet by mouth daily.    . Multiple Vitamins-Minerals (PRESERVISION AREDS 2) CAPS Take 1 capsule 2 (two) times daily by mouth.    . potassium chloride SA (K-DUR) 20 MEQ tablet Take 1 tablet (20 mEq total) by mouth daily. 30 tablet 0  . pravastatin (PRAVACHOL) 20 MG tablet Take 20 mg at bedtime by mouth.     . SYNTHROID 200 MCG tablet TAKE 1 TABLET BY MOUTH EVERY MORNING ON AN EMPTY STOMACH    . Thiamine HCl (VITAMIN B1) 100 MG TABS Take 1 tablet by mouth daily.    Marland Kitchen torsemide (DEMADEX) 20 MG tablet Take 1 tablet (20 mg total) by mouth daily. (Patient taking differently: Take 20 mg by mouth daily. Alternate from 20 Mg to 40 Mg every other day.) 90 tablet 3  . vitamin B-12 (CYANOCOBALAMIN) 100 MCG tablet Take 100 mcg by mouth 2 (two) times daily.    . Vitamin D, Ergocalciferol, (DRISDOL) 1.25 MG (50000 UT) CAPS capsule Take 50,000 Units by mouth every 7 (seven) days.     No facility-administered  medications prior to visit.     Review of Systems  Constitutional: Negative for chills, diaphoresis, fever, malaise/fatigue and weight loss.  HENT: Negative for congestion.   Respiratory: Positive for shortness of breath. Negative for cough, hemoptysis, sputum production and wheezing.   Cardiovascular: Negative for chest pain, palpitations and leg swelling.    Objective:   Vitals:  04/09/19 1208  BP: 108/78  Pulse: 99  Temp: (!) 97.2 F (36.2 C)  TempSrc: Temporal  SpO2: 92%  Weight: 285 lb 9.6 oz (129.5 kg)  Height: 5\' 2"  (1.575 m)      Physical Exam: General: Morbid obesity (BMI 52). Well-appearing, pleasant female, no acute distress HENT: [place], AT Eyes: EOMI, no scleral icterus Respiratory: Clear to auscultation bilaterally.  No crackles, wheezing or rales Cardiovascular: RRR, -M/R/G, no JVD Neuro: AAO x4, CNII-XII grossly intact Skin: Intact, no rashes or bruising Psych: Normal mood, normal affect  Data Reviewed:  Imaging: CXR 11/16/18 - No infiltrate, edema or effusion. Enlarged cardiac silhouette CXR 12/09/18 - Cardiomegaly, bibasilar atelectasis  PFT: 01/29/19 FVC 0.61 (21%) FEV1 0.61 (28%) Ratio 100 Interpretation: No obstructive defect. Reduced FVC and FEV1 suggestive of restrictive defect  Labs: CBC    Component Value Date/Time   WBC 16.7 (H) 12/11/2018 0753   RBC 4.62 12/11/2018 0753   HGB 14.4 12/11/2018 0753   HCT 47.6 (H) 12/11/2018 0753   PLT 337 12/11/2018 0753   MCV 103.0 (H) 12/11/2018 0753   MCH 31.2 12/11/2018 0753   MCHC 30.3 12/11/2018 0753   RDW 13.3 12/11/2018 0753   LYMPHSABS 2.0 12/11/2018 0753   MONOABS 1.7 (H) 12/11/2018 0753   EOSABS 0.5 12/11/2018 0753   BASOSABS 0.1 12/11/2018 0753   CMP     Component Value Date/Time   NA 140 02/12/2019 1536   K 5.0 02/12/2019 1536   CL 101 02/12/2019 1536   CO2 25 02/12/2019 1536   GLUCOSE 109 (H) 02/12/2019 1536   GLUCOSE 100 (H) 01/29/2019 1656   BUN 25 02/12/2019 1536   CREATININE  1.27 (H) 02/12/2019 1536   CREATININE 0.90 05/15/2018 1118   CALCIUM 11.1 (H) 02/12/2019 1536   PROT 6.9 12/09/2018 0543   ALBUMIN 3.8 12/09/2018 0543   AST 29 12/09/2018 0543   ALT 21 12/09/2018 0543   ALKPHOS 105 12/09/2018 0543   BILITOT 0.5 12/09/2018 0543   GFRNONAA 43 (L) 02/12/2019 1536   GFRNONAA 66 05/15/2018 1118   GFRAA 50 (L) 02/12/2019 1536   GFRAA 76 05/15/2018 1118   Sleep Study: 02/2016 - AHI 133  Ambulatory O2 11/22/18 Patient Saturations on Room Air at Rest = 88% Patient Saturations on Room Air while Ambulating = 88% Patient Saturations on 4 Liters of oxygen while Ambulating = 91%  Recommend 2L of O2 at rest, 4L of O2 with exertion  Ambulatory O2 04/09/19  Patient Saturations on Room Air at Rest = 92% Patient Saturations on Room Air while Ambulating = 87% Patient Saturations on 2Liters of oxygen while Ambulating = 95%  Recommend 0L of O2 via nasal cannula at rest  Recommend 2L of O2 via nasal cannula with exertion and sleep  TTE  12/02/18 - EF 60-65%. Diastolic heart failure, mildly decreased RV systolic function. Aortic and mitral annular calcification.  Imaging, labs and test noted above have been reviewed independently by me.    Assessment & Plan:   Discussion: 68 year old female with morbid obesity, OSA on CPAP, chronic diastolic heart failure, lumbar DDD and stenosis, bilateral osteoarthritis of hands, knees and feet and lupus who presents for follow-up for dyspnea on exertion and chronic hypoxemic respiratory failure. Her symptoms are likely multifactorial with her heart failure, sleep apnea and deconditioning. Her O2 requirement in-clinic today has improved after aggressive diuresis and is likely the main driver of her dyspnea and respiratory failure. I still advised pursuing PAP re-titration to rule  out any underlying obstructions including uncontrolled OSA or possible co-comitant OHS that would benefit from BiPAP therapy. We also discussed continuing  weight loss and regular aerobic activity.  Chronic hypoxemic respiratory failure secondary chronic diastolic heart failure - Improved however still requires 1-2L O2 on exertion CONTINUE supplemental oxygen to maintain levels for greater 88% RECOMMEND 2L of O2 via nasal cannula with exertion and sleep. We will order portable oxygen concentrator CONTINUE Albuterol as needed  Chronic diastolic heart failure CONTINUE diuretics as prescribed as your cardiologist  Obstructive sleep apnea Scheduled for PAP titration with BiPAP with follow-up scheduled with Dr. Ronnie Derby office  Chronic pain Followed by Rheumatology Unable to refer to Pain Clinic due to prior interaction with the referral practice. I updated patient on this.  Morbid Obesity Discussed portion control, calorie counting and DASH diet with goal for weight loss. Discussed regular aerobic activity.  Orders Placed This Encounter  Procedures  . Ambulatory Referral for DME    Referral Priority:   Routine    Referral Type:   Durable Medical Equipment Purchase    Number of Visits Requested:   1   No orders of the defined types were placed in this encounter.  Return in about 6 months (around 10/08/2019).   Anderson, MD [facility] 04/09/2019 9:02 AM  Office Number [phone]

## 2019-04-10 ENCOUNTER — Other Ambulatory Visit: Payer: Self-pay

## 2019-04-10 DIAGNOSIS — F900 Attention-deficit hyperactivity disorder, predominantly inattentive type: Secondary | ICD-10-CM

## 2019-04-11 ENCOUNTER — Other Ambulatory Visit: Payer: Self-pay

## 2019-04-11 MED ORDER — LISDEXAMFETAMINE DIMESYLATE 60 MG PO CAPS: 60.0000 mg | ORAL_CAPSULE | ORAL | 0 refills | Status: DC

## 2019-04-11 MED ORDER — TORSEMIDE 20 MG PO TABS: 20.0000 mg | ORAL_TABLET | Freq: Every day | ORAL | 3 refills | Status: DC

## 2019-04-15 NOTE — Progress Notes (Signed)
Cardiology Office Note:   Date:  04/16/2019  NAME:  Jessica [name]    MRN: [id number] DOB:  May 20, 1950   PCP:  Alroy Dust, L.Marlou Sa, MD  Cardiologist:  Evalina Field, MD   Referring MD: Aurea Graff.Marlou Sa, MD   Chief Complaint  Patient presents with  . Follow-up    3 months.  . Shortness of Breath   History of Present Illness:   Jessica [name] is a 68 y.o. female with a hx of morbid obesity, obesity hypoventilation syndrome/obstructive sleep apnea, overactive bladder, heart failure with preserved ejection fraction and likely right heart failure who presents for follow-up.  She reports she is doing well.  Her weights have remained stable since her last visit.  She still has lower extremity edema but this is improved with torsemide.  She has weaned off of most of her oxygen.  Her pulmonologist reports she is doing well from a lung standpoint.  She does report shortness of breath with exertion but this seems to be stable.  Blood pressures well controlled today.  She seems to be taking quite a bit of torsemide.  She does have incontinence symptoms and want to back off.  She denies chest pain, trouble breathing, palpitations.  Overall she reports she is made major changes to her diet and is taking to it.  Problem List: 1. HFpEF 2. RV Failure 2/2 OSA/OHS 3. Morbid Obesity 4. HTN  Past Medical History: Past Medical History:  Diagnosis Date  . Arthritis    lower back, hands, knees  . Bipolar disorder ([place])   . Cellulitis and abscess of left leg 11/29/2018  . Depression   . Dyspnea    with exertion  . GERD (gastroesophageal reflux disease)    patient unsure about this dx - no meds  . Headache   . History of IBS    watches diet  . Hyperlipidemia   . Hypertension   . Hypothyroidism   . Obese   . Plantar fasciitis   . Seasonal allergies   . Sleep apnea 2017   uses CPAP   . Spinal stenosis of lumbar region   . SVD (spontaneous vaginal delivery)    x 2  . Systemic lupus ([place])    . Thyroid disease     Past Surgical History: Past Surgical History:  Procedure Laterality Date  . CARPAL TUNNEL RELEASE Left 09/16/2017   Procedure: LEFT CARPAL TUNNEL RELEASE;  Surgeon: Daryll Brod, MD;  Location: [facility];  Service: Orthopedics;  Laterality: Left;  . carpel tunnel surgery Right   . CATARACT EXTRACTION Bilateral 2007   w/ lens implants  . COLONOSCOPY    . DILATION AND CURETTAGE OF UTERUS    . EYE SURGERY    . FOOT SURGERY Right    hammer toe  . FRACTURE SURGERY     R tibia and fibula  . LEG SURGERY Right   . TONSILLECTOMY    . WISDOM TOOTH EXTRACTION      Current Medications: Current Meds  Medication Sig  . acetaminophen (TYLENOL) 650 MG CR tablet Take 650 mg by mouth every 8 (eight) hours as needed for pain.  . Acetylcysteine 600 MG CAPS Take 600 mg by mouth 2 (two) times daily. NAC  . amLODipine (NORVASC) 10 MG tablet Take 1 tablet (10 mg total) by mouth daily.  . cycloSPORINE (RESTASIS) 0.05 % ophthalmic emulsion Place 1 drop into both eyes 2 (two) times daily.  . DULoxetine (CYMBALTA) 60 MG capsule Take 1 capsule (60  mg total) by mouth daily.  Marland Kitchen gabapentin (NEURONTIN) 300 MG capsule Take 1 capsule (300 mg total) by mouth at bedtime. (Patient taking differently: Take 1,200 mg by mouth daily. TAKE 1 TABLET IN THE MORNING AND 3 TABLETS AT NIGHT.)  . levothyroxine (SYNTHROID) 25 MCG tablet Take 225 mcg by mouth daily before breakfast.   . lisdexamfetamine (VYVANSE) 60 MG capsule Take 1 capsule (60 mg total) by mouth every morning.  . lithium carbonate 150 MG capsule Take 5 capsules (750 mg total) by mouth at bedtime. (Patient taking differently: Take 600 mg by mouth at bedtime. )  . LORazepam (ATIVAN) 0.5 MG tablet Take 1 tablet (0.5 mg total) by mouth 2 (two) times daily as needed for anxiety.  . metoprolol tartrate (LOPRESSOR) 25 MG tablet Take 0.5 tablets (12.5 mg total) by mouth 2 (two) times daily.  . Multiple Vitamins-Minerals (MULTIVITAMIN WITH MINERALS)  tablet Take 1 tablet by mouth daily.  . Multiple Vitamins-Minerals (PRESERVISION AREDS 2) CAPS Take 1 capsule 2 (two) times daily by mouth.  . potassium chloride SA (KLOR-CON) 20 MEQ tablet Take 1 tablet by mouth every other day on days Torsemide is taken  . pravastatin (PRAVACHOL) 20 MG tablet Take 20 mg at bedtime by mouth.   . SYNTHROID 200 MCG tablet TAKE 1 TABLET BY MOUTH EVERY MORNING ON AN EMPTY STOMACH  . Thiamine HCl (VITAMIN B1) 100 MG TABS Take 1 tablet by mouth daily.  Marland Kitchen torsemide (DEMADEX) 20 MG tablet Take 1 tablet by mouth every other day. My take an additional tablet for weight gain of 3 lbs overnight and 5 lbs in a week  . vitamin B-12 (CYANOCOBALAMIN) 100 MCG tablet Take 100 mcg by mouth 2 (two) times daily.  . Vitamin D, Ergocalciferol, (DRISDOL) 1.25 MG (50000 UT) CAPS capsule Take 50,000 Units by mouth every 7 (seven) days.  . [DISCONTINUED] potassium chloride SA (K-DUR) 20 MEQ tablet Take 1 tablet (20 mEq total) by mouth daily.  . [DISCONTINUED] torsemide (DEMADEX) 20 MG tablet Take 1 tablet (20 mg total) by mouth daily.     Allergies:    Ibuprofen, Codeine, Nabumetone, Promethazine, Amoxicillin-pot clavulanate, Erythromycin base, and Promethazine-codeine   Social History: Social History   Socioeconomic History  . Marital status: Married    Spouse name: Not on file  . Number of children: Not on file  . Years of education: Not on file  . Highest education level: Not on file  Occupational History  . Not on file  Tobacco Use  . Smoking status: Never Smoker  . Smokeless tobacco: Never Used  Substance and Sexual Activity  . Alcohol use: Yes    Comment: rarely  . Drug use: Never  . Sexual activity: Not on file  Other Topics Concern  . Not on file  Social History Narrative   She lives in a single level home with her husband.  She has two grown children.   She taught college accounting courses, retired in 2005.    Highest level of education:  Designer, jewellery in  Press photographer   Social Determinants of Health   Financial Resource Strain:   . Difficulty of Paying Living Expenses: Not on file  Food Insecurity:   . Worried About Charity fundraiser in the Last Year: Not on file  . Ran Out of Food in the Last Year: Not on file  Transportation Needs:   . Lack of Transportation (Medical): Not on file  . Lack of Transportation (Non-Medical): Not on file  Physical  Activity:   . Days of Exercise per Week: Not on file  . Minutes of Exercise per Session: Not on file  Stress:   . Feeling of Stress : Not on file  Social Connections:   . Frequency of Communication with Friends and Family: Not on file  . Frequency of Social Gatherings with Friends and Family: Not on file  . Attends Religious Services: Not on file  . Active Member of Clubs or Organizations: Not on file  . Attends Archivist Meetings: Not on file  . Marital Status: Not on file    Family History: The patient's family history includes Bipolar disorder in her father; Breast cancer in her mother; Dementia in her father; Diabetes in her father; Heart disease in her father and mother; Thyroid disease in her mother.  ROS:   All other ROS reviewed and negative. Pertinent positives noted in the HPI.     EKGs/Labs/Other Studies Reviewed:   The following studies were personally reviewed by me today:  TTE 12/02/2018  1. The left ventricle has normal systolic function with an ejection fraction of 60-65%. The cavity size was normal. There is mild concentric left ventricular hypertrophy. Left ventricular diastolic Doppler parameters are consistent with impaired  relaxation. Indeterminate filling pressures.  2. The right ventricle has mildly reduced systolic function. The cavity was mildly enlarged. There is no increase in right ventricular wall thickness.  3. The aortic valve is tricuspid. Moderate aortic annular calcification noted.  4. The mitral valve is grossly normal. There is mild mitral  annular calcification present.  5. The tricuspid valve is grossly normal.  6. The aorta is normal in size and structure.  Recent Labs: 12/06/2018: B Natriuretic Peptide 82.5 12/09/2018: ALT 21 12/10/2018: Magnesium 2.5 12/11/2018: Hemoglobin 14.4; Platelets 337 02/12/2019: BUN 25; Creatinine, Ser 1.27; Potassium 5.0; Sodium 140   Recent Lipid Panel    Component Value Date/Time   CHOL 173 01/23/2013 1105   TRIG 271 (H) 01/23/2013 1105   HDL 51 01/23/2013 1105   CHOLHDL 3.4 01/23/2013 1105   VLDL 54 (H) 01/23/2013 1105   LDLCALC 68 01/23/2013 1105    Physical Exam:   VS:  BP 110/60 (BP Location: Left Arm, Patient Position: Sitting, Cuff Size: Large)   Pulse 96   Temp (!) 97.3 F (36.3 C)   Ht 5\' 2"  (1.575 m)   Wt 286 lb (129.7 kg)   BMI 52.31 kg/m    Wt Readings from Last 3 Encounters:  04/16/19 286 lb (129.7 kg)  04/09/19 285 lb 9.6 oz (129.5 kg)  02/12/19 291 lb (132 kg)    General: Obese female, no acute distress Heart: Atraumatic, normal size  Eyes: PEERLA, EOMI  Neck: Supple, no JVD Endocrine: No thryomegaly Cardiac: Normal S1, S2; RRR; no murmurs, rubs, or gallops Lungs: Diminished breath sounds bilaterally Abd: Soft, nontender, no hepatomegaly  Ext: Venous insufficiency changes noted, 1+ edema Musculoskeletal: No deformities, BUE and BLE strength normal and equal Skin: Changes of venous insufficiency in the lower extremities Neuro: Alert and oriented to person, place, time, and situation, CNII-XII grossly intact, no focal deficits  Psych: Normal mood and affect   ASSESSMENT:   Jessica [name] is a 69 y.o. female who presents for the following: 1. RVF (right ventricular failure) ([place])   2. Chronic diastolic heart failure ([place])   3. Essential hypertension   4. Obesity, morbid, BMI 50 or higher ([place])   5. Vitamin D deficiency   6. Venous insufficiency  PLAN:   1. RVF (right ventricular failure) ([place]) 2. Chronic diastolic heart failure ([place]) -She really  is doing quite well.  She is made major changes to her diet.  Regarding her torsemide we will proceed with 20 mg every other day.  I informed her that if she notices worsening lower extreme edema she can take an extra 20 mg on a days she planned not to take it.  We will also plan for 20 mEq of potassium supplementation with torsemide.  We will recheck a BMP today.  Since we are drawing blood she also wants her vitamin D as well as lithium level checked.  Her blood pressure is good and controlled.  She has continued to do well with her pulmonologist and her oxygen therapy.  They are treating her for OSA and OHS.  3. Essential hypertension -Well-controlled on amlodipine and metoprolol  4. Obesity, morbid, BMI 50 or higher ([place]) -She continues to diet and exercise  5. Vitamin D deficiency -Check vitamin D level today  6. Venous insufficiency -I told her that would be a good idea to wear compression stockings.  Continue torsemide as above.   Disposition: Return in about 6 months (around 10/15/2019).  Medication Adjustments/Labs and Tests Ordered: Current medicines are reviewed at length with the patient today.  Concerns regarding medicines are outlined above.  Orders Placed This Encounter  Procedures  . Comprehensive metabolic panel  . Lithium level  . Vitamin D (25 hydroxy)   Meds ordered this encounter  Medications  . torsemide (DEMADEX) 20 MG tablet    Sig: Take 1 tablet by mouth every other day. My take an additional tablet for weight gain of 3 lbs overnight and 5 lbs in a week    Dispense:  45 tablet    Refill:  1    Discontinue furosemide  . potassium chloride SA (KLOR-CON) 20 MEQ tablet    Sig: Take 1 tablet by mouth every other day on days Torsemide is taken    Dispense:  45 tablet    Refill:  1    Patient Instructions  Medication Instructions:   TAKE TORSEMIDE 20 MG EVERY OTHER DAY. MAY TAKE AN ADDITIONAL TABLET IF WEIGHT GAIN OF 3 LBS OVERNIGHT OR 5 LBS IN A WEEK  TAKE  POTASSIUM 20 MEQ ON DAYS TORSEMIDE IS TAKEN  *If you need a refill on your cardiac medications before your next appointment, please call your pharmacy*  Lab Work: Your physician recommends that you return for lab work TODAY:  CMET  LITHIUM  VITAMIN D  If you have labs (blood work) drawn today and your tests are completely normal, you will receive your results only by: Marland Kitchen MyChart Message (if you have MyChart) OR . A paper copy in the mail If you have any lab test that is abnormal or we need to change your treatment, we will call you to review the results.  Testing/Procedures: NONE ordered at this time of appointment   Follow-Up: At [facility], you and your health needs are our priority.  As part of our continuing mission to provide you with exceptional heart care, we have created designated Provider Care Teams.  These Care Teams include your primary Cardiologist (physician) and Advanced Practice Providers (APPs -  Physician Assistants and Nurse Practitioners) who all work together to provide you with the care you need, when you need it.  Your next appointment:   6 month(s)  The format for your next appointment:   In Person  Provider:   Eleonore Chiquito, MD  Other Instructions      Signed, Addison Naegeli. Audie Box, [place]  [address], [place], [place] [address] [phone]  04/16/2019 5:36 PM

## 2019-04-16 ENCOUNTER — Telehealth: Payer: Self-pay | Admitting: Cardiovascular Disease

## 2019-04-16 ENCOUNTER — Other Ambulatory Visit: Payer: Self-pay

## 2019-04-16 ENCOUNTER — Encounter: Payer: Self-pay | Admitting: Cardiovascular Disease

## 2019-04-16 ENCOUNTER — Ambulatory Visit (INDEPENDENT_AMBULATORY_CARE_PROVIDER_SITE_OTHER): Payer: Medicare Other | Admitting: Cardiovascular Disease

## 2019-04-16 VITALS — BP 110/60 | HR 96 | Temp 97.3°F | Ht 62.0 in | Wt 286.0 lb

## 2019-04-16 DIAGNOSIS — E559 Vitamin D deficiency, unspecified: Secondary | ICD-10-CM | POA: Diagnosis not present

## 2019-04-16 DIAGNOSIS — I10 Essential (primary) hypertension: Secondary | ICD-10-CM | POA: Diagnosis not present

## 2019-04-16 DIAGNOSIS — I5032 Chronic diastolic (congestive) heart failure: Secondary | ICD-10-CM | POA: Diagnosis not present

## 2019-04-16 DIAGNOSIS — I50810 Right heart failure, unspecified: Secondary | ICD-10-CM

## 2019-04-16 DIAGNOSIS — I872 Venous insufficiency (chronic) (peripheral): Secondary | ICD-10-CM | POA: Diagnosis not present

## 2019-04-16 MED ORDER — TORSEMIDE 20 MG PO TABS: ORAL_TABLET | ORAL | 1 refills | Status: DC

## 2019-04-16 MED ORDER — POTASSIUM CHLORIDE CRYS ER 20 MEQ PO TBCR: EXTENDED_RELEASE_TABLET | ORAL | 1 refills | Status: DC

## 2019-04-16 NOTE — Patient Instructions (Signed)
Medication Instructions:   TAKE TORSEMIDE 20 MG EVERY OTHER DAY. MAY TAKE AN ADDITIONAL TABLET IF WEIGHT GAIN OF 3 LBS OVERNIGHT OR 5 LBS IN A WEEK  TAKE POTASSIUM 20 MEQ ON DAYS TORSEMIDE IS TAKEN  *If you need a refill on your cardiac medications before your next appointment, please call your pharmacy*  Lab Work: Your physician recommends that you return for lab work TODAY:  CMET  LITHIUM  VITAMIN D  If you have labs (blood work) drawn today and your tests are completely normal, you will receive your results only by: Marland Kitchen MyChart Message (if you have MyChart) OR . A paper copy in the mail If you have any lab test that is abnormal or we need to change your treatment, we will call you to review the results.  Testing/Procedures: NONE ordered at this time of appointment   Follow-Up: At [facility], you and your health needs are our priority.  As part of our continuing mission to provide you with exceptional heart care, we have created designated Provider Care Teams.  These Care Teams include your primary Cardiologist (physician) and Advanced Practice Providers (APPs -  Physician Assistants and Nurse Practitioners) who all work together to provide you with the care you need, when you need it.  Your next appointment:   6 month(s)  The format for your next appointment:   In Person  Provider:   Eleonore Chiquito, MD  Other Instructions

## 2019-04-16 NOTE — Telephone Encounter (Signed)
Pt keeping today's appt ./cy

## 2019-04-16 NOTE — Telephone Encounter (Signed)
Called Mrs. Goodspeed. Ok for her to come to appointment today. No symptoms of covid.   [place] T. Audie Box, [place]  [address] [place], [place] [address] [phone]  12:58 PM

## 2019-04-16 NOTE — Telephone Encounter (Signed)
We could offer her a virtual appointment tomorrow if she wants? Up to her. -W

## 2019-04-16 NOTE — Telephone Encounter (Signed)
STAT if patient feels like he/she is going to faint   1) Are you dizzy now? no  2) Do you feel faint or have you passed out? no  3) Do you have any other symptoms? Headaches for the past couple of days  4) Have you checked your HR and BP (record if available)? HR has been okay.  Patient called asking if she should still come in for her appointment today 04/16/19 at 3pm when she is feeling light headed. She would also like clarification on how she should be taking her torsemide (DEMADEX) 20 MG tablet. Patient says if she does not answer the first time to call [phone].

## 2019-04-16 NOTE — Telephone Encounter (Signed)
Spoke with pt has noted h/a and lightheadedness 4-5 episodes in 2- 3 days  No other symptoms Pt has not checked B/P  no fever and O2 in the 90's Also pt wanting to know if needs to keep today's appt Encouraged pt to keep appt Pt also received Torsemide refill and instructions are wrong pt should be taking Torsemide 20 mg qod alternating with 40 mg qod not 20 mg every day Will forward to Dr Audie Box for review .Adonis Housekeeper

## 2019-04-17 ENCOUNTER — Telehealth: Payer: Self-pay | Admitting: Cardiovascular Disease

## 2019-04-17 LAB — COMPREHENSIVE METABOLIC PANEL
ALT: 17 IU/L (ref 0–32)
AST: 22 IU/L (ref 0–40)
Albumin/Globulin Ratio: 1.5 (ref 1.2–2.2)
Albumin: 4 g/dL (ref 3.8–4.8)
Alkaline Phosphatase: 146 IU/L — ABNORMAL HIGH (ref 39–117)
BUN/Creatinine Ratio: 27 (ref 12–28)
BUN: 27 mg/dL (ref 8–27)
Bilirubin Total: 0.2 mg/dL (ref 0.0–1.2)
CO2: 23 mmol/L (ref 20–29)
Calcium: 10.9 mg/dL — ABNORMAL HIGH (ref 8.7–10.3)
Chloride: 102 mmol/L (ref 96–106)
Creatinine, Ser: 0.99 mg/dL (ref 0.57–1.00)
GFR calc Af Amer: 67 mL/min/{1.73_m2} (ref >59)
GFR calc non Af Amer: 58 mL/min/{1.73_m2} — ABNORMAL LOW (ref >59)
Globulin, Total: 2.6 g/dL (ref 1.5–4.5)
Glucose: 115 mg/dL — ABNORMAL HIGH (ref 65–99)
Potassium: 4.8 mmol/L (ref 3.5–5.2)
Sodium: 137 mmol/L (ref 134–144)
Total Protein: 6.6 g/dL (ref 6.0–8.5)

## 2019-04-17 LAB — VITAMIN D 25 HYDROXY (VIT D DEFICIENCY, FRACTURES): Vit D, 25-Hydroxy: 23.9 ng/mL — ABNORMAL LOW (ref 30.0–100.0)

## 2019-04-17 LAB — LITHIUM LEVEL: Lithium Lvl: 0.6 mmol/L (ref 0.6–1.2)

## 2019-04-17 MED ORDER — VITAMIN D (ERGOCALCIFEROL) 1.25 MG (50000 UNIT) PO CAPS: 50000.0000 [IU] | ORAL_CAPSULE | ORAL | 0 refills | Status: DC

## 2019-04-17 NOTE — Telephone Encounter (Signed)
Updated Mrs. Cillo about labs. Cr ok. Li ok. Vit D low. Will supplement for 8 weeks and re-check.   [place] B[place] T. Audie Box, [place]  [address], [place], [place] [address] [phone]  3:44 PM

## 2019-04-20 ENCOUNTER — Ambulatory Visit
Admission: RE | Admit: 2019-04-20 | Discharge: 2019-04-20 | Disposition: A | Discharge status: Home / Self Care | Payer: Medicare Other | Source: Ambulatory Visit | Attending: Orthopedic Surgery | Admitting: Orthopedic Surgery

## 2019-04-20 ENCOUNTER — Other Ambulatory Visit: Payer: Self-pay | Admitting: Orthopedic Surgery

## 2019-04-20 DIAGNOSIS — R52 Pain, unspecified: Secondary | ICD-10-CM

## 2019-04-23 ENCOUNTER — Other Ambulatory Visit: Payer: Self-pay | Admitting: Psychiatry

## 2019-04-23 ENCOUNTER — Telehealth: Payer: Self-pay | Admitting: Psychiatry

## 2019-04-23 DIAGNOSIS — N950 Postmenopausal bleeding: Secondary | ICD-10-CM | POA: Diagnosis not present

## 2019-04-23 DIAGNOSIS — Z124 Encounter for screening for malignant neoplasm of cervix: Secondary | ICD-10-CM | POA: Diagnosis not present

## 2019-04-23 DIAGNOSIS — Z01419 Encounter for gynecological examination (general) (routine) without abnormal findings: Secondary | ICD-10-CM | POA: Diagnosis not present

## 2019-04-23 DIAGNOSIS — Z1239 Encounter for other screening for malignant neoplasm of breast: Secondary | ICD-10-CM | POA: Diagnosis not present

## 2019-04-23 MED ORDER — LORAZEPAM 0.5 MG PO TABS: 0.5000 mg | ORAL_TABLET | Freq: Two times a day (BID) | ORAL | 0 refills | Status: DC | PRN

## 2019-04-23 NOTE — Telephone Encounter (Signed)
Prescription sent

## 2019-04-23 NOTE — Telephone Encounter (Signed)
Jessica [name] called to request refill of her Lorazepam.  It is a prn medication but with her other health issues and Bob's issues she would like to have some on hand.  [facility] [address] in [place].

## 2019-04-30 ENCOUNTER — Telehealth: Payer: Self-pay

## 2019-04-30 NOTE — Telephone Encounter (Signed)
The only mood stabilizer she's taking is lithium and her last 2 levels have been 0.6 which is in the low normal range.  Verify that she's currently taking lithium 600 mg daily.  She can raise the dosage by 150 mg back to 750 mg daily.

## 2019-04-30 NOTE — Telephone Encounter (Signed)
Spoke with [facility] today and she is complaining of moods being inconsistent. She has been getting more irritable, mood swings, got upset on Christmas and cried which is unusual for her. She has CHF and is taking Lasix to help with edema. Nurse at her cardiology office said the fluid pills could be flushing Lithium out. They checked her Nicoletta Dress level and it was 0.6. Results are in Epic dated 04-16-19. Please review and advise.

## 2019-04-30 NOTE — Telephone Encounter (Signed)
Verified that pt is taking 600 mg of Li daily. She will increase to 750 mg daily.

## 2019-05-06 ENCOUNTER — Other Ambulatory Visit: Payer: Self-pay | Admitting: Psychiatry

## 2019-05-14 ENCOUNTER — Other Ambulatory Visit: Payer: Self-pay

## 2019-05-14 ENCOUNTER — Telehealth: Payer: Self-pay | Admitting: Psychiatry

## 2019-05-14 DIAGNOSIS — F900 Attention-deficit hyperactivity disorder, predominantly inattentive type: Secondary | ICD-10-CM

## 2019-05-14 MED ORDER — LISDEXAMFETAMINE DIMESYLATE 60 MG PO CAPS: 60.0000 mg | ORAL_CAPSULE | ORAL | 0 refills | Status: DC

## 2019-05-14 NOTE — Telephone Encounter (Signed)
Please RF Vyvanse [facility] on [facility].

## 2019-05-14 NOTE — Telephone Encounter (Signed)
Last refill 04/11/2019 Pended for Dr. Clovis Pu to submit

## 2019-05-15 DIAGNOSIS — F3175 Bipolar disorder, in partial remission, most recent episode depressed: Secondary | ICD-10-CM | POA: Diagnosis not present

## 2019-05-15 DIAGNOSIS — E7800 Pure hypercholesterolemia, unspecified: Secondary | ICD-10-CM | POA: Diagnosis not present

## 2019-05-15 DIAGNOSIS — E039 Hypothyroidism, unspecified: Secondary | ICD-10-CM | POA: Diagnosis not present

## 2019-05-21 ENCOUNTER — Other Ambulatory Visit: Payer: Self-pay | Admitting: Psychiatry

## 2019-05-21 ENCOUNTER — Encounter: Payer: Self-pay | Admitting: Physical Therapy

## 2019-05-21 ENCOUNTER — Other Ambulatory Visit: Payer: Self-pay

## 2019-05-21 ENCOUNTER — Ambulatory Visit: Payer: Medicare Other | Attending: Family Medicine | Admitting: Physical Therapy

## 2019-05-21 DIAGNOSIS — M6281 Muscle weakness (generalized): Secondary | ICD-10-CM

## 2019-05-21 DIAGNOSIS — R262 Difficulty in walking, not elsewhere classified: Secondary | ICD-10-CM | POA: Insufficient documentation

## 2019-05-21 DIAGNOSIS — M62830 Muscle spasm of back: Secondary | ICD-10-CM | POA: Diagnosis not present

## 2019-05-21 DIAGNOSIS — G8929 Other chronic pain: Secondary | ICD-10-CM | POA: Diagnosis not present

## 2019-05-21 DIAGNOSIS — M545 Low back pain: Secondary | ICD-10-CM | POA: Insufficient documentation

## 2019-05-22 ENCOUNTER — Encounter: Payer: Self-pay | Admitting: Physical Therapy

## 2019-05-22 ENCOUNTER — Ambulatory Visit: Payer: Self-pay | Admitting: Rheumatology

## 2019-05-22 NOTE — Therapy (Signed)
[place], [place], [address] Phone: [phone]   Fax:  [phone]  Physical Therapy Evaluation  Patient Details  Name: Jessica [name] MRN: [id number] Date of Birth: [birth year] Referring Provider (PT): Donnie Coffin MD    Encounter Date: 05/21/2019  PT End of Session - 05/21/19 1611    Visit Number  1    Number of Visits  6    Date for PT Re-Evaluation  07/03/2019   Authorization Type  medicare progress note after 10 visits    PT Start Time  [id number]   Patient was 5 minutes late   PT Stop Time  1630    PT Time Calculation (min)  40 min    Activity Tolerance  Patient limited by fatigue    Behavior During Therapy  [facility] for tasks assessed/performed       Past Medical History:  Diagnosis Date  . Arthritis    lower back, hands, knees  . Bipolar disorder ([place])   . Cellulitis and abscess of left leg 11/29/2018  . Depression   . Dyspnea    with exertion  . GERD (gastroesophageal reflux disease)    patient unsure about this dx - no meds  . Headache   . History of IBS    watches diet  . Hyperlipidemia   . Hypertension   . Hypothyroidism   . Obese   . Plantar fasciitis   . Seasonal allergies   . Sleep apnea 2017   uses CPAP   . Spinal stenosis of lumbar region   . SVD (spontaneous vaginal delivery)    x 2  . Systemic lupus ([place])   . Thyroid disease     Past Surgical History:  Procedure Laterality Date  . CARPAL TUNNEL RELEASE Left 09/16/2017   Procedure: LEFT CARPAL TUNNEL RELEASE;  Surgeon: Daryll Brod, MD;  Location: [facility];  Service: Orthopedics;  Laterality: Left;  . carpel tunnel surgery Right   . CATARACT EXTRACTION Bilateral 2007   w/ lens implants  . COLONOSCOPY    . DILATION AND CURETTAGE OF UTERUS    . EYE SURGERY    . FOOT SURGERY Right    hammer toe  . FRACTURE SURGERY     R tibia and fibula  . LEG SURGERY Right   . TONSILLECTOMY    . WISDOM TOOTH EXTRACTION      There  were no vitals filed for this visit.   Subjective Assessment - 05/21/19 1559    Subjective  Patient has a long history of deconditioning and lower back pain. She has increased painwhen she is walking. She was seen last year but had severe dyspnea and was sent back to her MD. She was put on oxygen. She comes in today 2nd to deconditioning.    Limitations  Sitting;Lifting;Standing;Walking;House hold activities    How long can you sit comfortably?  any prolonged poistioning causes pain    How long can you stand comfortably?  3-4 minutes before she has too much discomfort    How long can you walk comfortably?  sugnificant dyspnea coming from the lobby to a treatment room    Patient Stated Goals  to be able to walk further more comfortably    Currently in Pain?  Yes    Pain Score  7     Pain Location  Back    Pain Orientation  Left;Right    Pain Descriptors / Indicators  Aching    Pain Type  Chronic pain    Pain Radiating Towards  pain does not radiate down the legs    Pain Onset  More than a month ago    Pain Frequency  Intermittent    Aggravating Factors   Standing and walking; uses a rollator; sitting on a hard surface;    Pain Relieving Factors  changing positions    Multiple Pain Sites  No         [facility] PT Assessment - 05/22/19 0001      Assessment   Medical Diagnosis  Low Back and foot pain     Referring Provider (PT)  Donnie Coffin MD     Hand Dominance  Right    Prior Therapy  Has had therapy for her back and knees in the past but has only been able to come for a few visits at a time for various reasons       Precautions   Precautions  None      Restrictions   Weight Bearing Restrictions  No      Balance Screen   Has the patient fallen in the past 6 months  No    Has the patient had a decrease in activity level because of a fear of falling?   No    Is the patient reluctant to leave their home because of a fear of falling?   No      Home Environment   Additional  Comments  Lives with husband. 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    Leisure  used to be able to walk       Cognition   Overall Cognitive Status  Within Functional Limits for tasks assessed    Attention  Focused    Focused Attention  Appears intact    Memory  Appears intact    Awareness  Appears intact    Problem Solving  Appears intact      Observation/Other Assessments   Observations  2 min walk test 40' with 1 seated rest break at 120 seated rest break until 18 seconds then ambulated another 30; 126 feet total       Sensation   Light Touch  Appears Intact    Additional Comments  bilateral peripheral neuropathy       Coordination   Gross Motor Movements are Fluid and Coordinated  Yes    Fine Motor Movements are Fluid and Coordinated  Yes      ROM / Strength   AROM / PROM / Strength  AROM;PROM;Strength      AROM   Overall AROM Comments  in seated limited active motion of bilateral hips and shoulders to 90 degrees of flexion without pain       PROM   Overall PROM Comments  unable to assess hips      Strength   Right Hip Flexion  4/5    Right Hip ABduction  4/5    Right Hip ADduction  4/5    Left Hip Flexion  4/5    Left Hip ABduction  4/5    Left Hip ADduction  4/5    Right Knee Flexion  4/5    Right Knee Extension  4/5    Left Knee Flexion  4/5    Left Knee Extension  4/5  Right Ankle Dorsiflexion  4/5    Left Ankle Dorsiflexion  4/5      Palpation   Palpation comment  difficult to assess 2nd to large amounts of adipose tissue       Transfers   Comments  slow transfer to dsit to stand with hands       Ambulation/Gait   Gait Comments  flexed trunk; decreased bilateral hip flexion likely due to lare pannus                Objective measurements completed on  examination: See above findings.      [facility] Adult PT Treatment/Exercise - 05/22/19 0001      Exercises   Exercises  Lumbar      Lumbar Exercises: Seated   Other Seated Lumbar Exercises  LAQ with posture x10 each leg     Other Seated Lumbar Exercises  hell raises seated x10 each leg              PT Education - 05/21/19 1607    Education Details  HEP; symptom mangement    Person(s) Educated  Patient    Methods  Explanation;Demonstration;Tactile cues;Verbal cues    Comprehension  Verbalized understanding;Returned demonstration;Verbal cues required;Tactile cues required       PT Short Term Goals - 05/22/19 1306      PT SHORT TERM GOAL #1   Title  Patient will be indepdnent with very basic HEP    Time  3    Period  Weeks    Status  New    Target Date  06/12/19      PT SHORT TERM GOAL #2   Title  Patient will ambualte 150' without a seated rest break    Time  3    Period  Weeks    Status  New    Target Date  06/12/19      PT SHORT TERM GOAL #3   Title  Pt will state 3 pain management strategies or proper posture /body mechanics for household chores    Time  3    Period  Weeks    Status  On-going    Target Date  06/12/19        PT Long Term Goals - 05/22/19 1307      PT LONG TERM GOAL #1   Title  Patient will transfer sit to stand witout dyspnea and pain    Time  6    Period  Weeks    Status  New    Target Date  07/03/19      PT LONG TERM GOAL #2   Title  Patient will ambualte 300' without a rest break on 2 min walk test in order to improve community ambualtion    Time  6    Period  Weeks    Status  New    Target Date  07/03/19      PT LONG TERM GOAL #3   Title  Patient will stand a t home for 20 min without self reported increased lower back pain    Time  6    Period  Weeks    Status  New    Target Date  07/03/19             Plan - 05/22/19 1302    Clinical Impression Statement  Patient is a 69 year old female with a long histroy of low  back pain, bilateral foot pain, and severe deconditioning. She was on oxygen  until [employer]. She has came off oxygen but still feels like her endurance is decreased. On the 2 minute walk test she was able to walk 125' with a 30 second seated rest break. Her bilateral lower extremity strength has improved since her last evaluation but she is still limited. She has increased pian in her hands when she walks but she puts large amounts of pressure on her hands. She may be able to decrease the amount of stress in her hands if she could stand up straighter with her walker. Therapy will work on gait and standing endurance with the patient. She would benefit from bilateral LE strengthening and functional mobility training.    Personal Factors and Comorbidities  Comorbidity 1;Comorbidity 2;Comorbidity 3+    Comorbidities  HTN, OA, plantar facitis, dyspnea, bi-polar    Examination-Activity Limitations  Stand;Stairs;Squat;Sit;Continence;Dressing;Hygiene/Grooming;Lift;Locomotion Level;Bed Mobility;Bend    Examination-Participation Restrictions  Meal Prep;Cleaning;Community Activity;Driving;Shop;Laundry;Yard Work    Stability/Clinical Decision Making  Unstable/Unpredictable    International aid/development worker    Rehab Potential  Poor    PT Frequency  1x / week    PT Duration  6 weeks    PT Treatment/Interventions  ADLs/Self Care Home Management;Cryotherapy;Electrical Stimulation;Iontophoresis 4mg /ml Dexamethasone;Gait training;Moist Heat;DME Instruction;Stair training;Functional mobility training;Therapeutic activities;Therapeutic exercise;Neuromuscular re-education;Manual techniques;Patient/family education;Passive range of motion;Taping    PT Next Visit Plan  funbctional strengthening may be the best option; sit to stand transfers, standing in II bars with weight shifting and marching. Any seated exercises.    PT Home Exercise Plan  seated bilateral ER seated low range horizontal abduction ankle pumps, low range LAQ.  Patient advised noty to perfrom until she sees MD. Patient just shown how to do the exercises    Consulted and Agree with Plan of Care  Patient       Patient will benefit from skilled therapeutic intervention in order to improve the following deficits and impairments:  Abnormal gait, Decreased range of motion, Increased fascial restricitons, Increased muscle spasms, Impaired UE functional use, Decreased knowledge of use of DME, Decreased mobility, Decreased strength, Improper body mechanics, Pain, Impaired perceived functional ability, Decreased safety awareness, Decreased endurance  Visit Diagnosis: Chronic bilateral low back pain without sciatica  Muscle spasm of back  Difficulty in walking, not elsewhere classified  Muscle weakness (generalized)     Problem List Patient Active Problem List   Diagnosis Date Noted  . Chronic diastolic heart failure ([place])   . Systemic lupus ([place])   . Morbid obesity due to excess calories ([place])   . Bipolar disorder ([place])   . Cellulitis 11/29/2018  . OSA (obstructive sleep apnea) 11/27/2018  . Chronic hypoxemic respiratory failure ([place]) 11/27/2018  . Primary osteoarthritis of both hands 04/19/2017  . Primary osteoarthritis of both knees 04/19/2017  . Primary osteoarthritis of both feet 04/19/2017  . DDD (degenerative disc disease), lumbar 04/19/2017  . History of bipolar disorder 04/19/2017  . Hypothyroidism 06/04/2014  . HTN (hypertension) 01/23/2013  . HLD (hyperlipidemia) 01/23/2013    Carney Living  PT DPT  05/22/2019, 1:23 PM  [facility] [address] [place], [place], [address] Phone: [phone]   Fax:  [phone]  Name: DESHAWNDA MECHEM MRN: [id number] Date of Birth: 12/02/1950

## 2019-05-28 ENCOUNTER — Other Ambulatory Visit: Payer: Self-pay | Admitting: Family Medicine

## 2019-05-28 DIAGNOSIS — Z1231 Encounter for screening mammogram for malignant neoplasm of breast: Secondary | ICD-10-CM

## 2019-06-01 ENCOUNTER — Ambulatory Visit: Payer: Medicare Other | Admitting: Physical Therapy

## 2019-06-04 ENCOUNTER — Telehealth (INDEPENDENT_AMBULATORY_CARE_PROVIDER_SITE_OTHER): Payer: Medicare Other | Admitting: Neurology

## 2019-06-04 ENCOUNTER — Other Ambulatory Visit: Payer: Self-pay

## 2019-06-04 DIAGNOSIS — G5603 Carpal tunnel syndrome, bilateral upper limbs: Secondary | ICD-10-CM

## 2019-06-04 DIAGNOSIS — G629 Polyneuropathy, unspecified: Secondary | ICD-10-CM

## 2019-06-04 NOTE — Progress Notes (Signed)
   Virtual Visit via Video Note The purpose of this virtual visit is to provide medical care while limiting exposure to the novel coronavirus.    Consent was obtained for video visit:  Yes.   Answered questions that patient had about telehealth interaction:  Yes.   I discussed the limitations, risks, security and privacy concerns of performing an evaluation and management service by telemedicine. I also discussed with the patient that there may be a patient responsible charge related to this service. The patient expressed understanding and agreed to proceed.  Pt location: Home Physician Location: office Name of referring provider:  Alroy Dust, L.Marlou Sa, MD I connected with [name] at patients initiation/request on 06/04/2019 at 10:50 AM EST by video enabled telemedicine application and verified that I am speaking with the correct person using two identifiers. Pt MRN:  [id number] Pt DOB:  05/20/1950 Video Participants:  [name]   History of Present Illness: This is a 69 y.o. female previously seen for idiopathic peripheral neuropathy returning for follow-up of bilateral hand tingling.  For the past several months, she developed tingling over the fingertip, which is worse over the middle and ring finger. Symptoms are constant in the fingertips and worse with activity, such as writing, doing laundry, etc. She feels that the hands always feel cold.  She reports having CTS release about 30 years ago in [place].  She has similar symptoms in the left hand, but to a lesser degree. She underwent CTS release by Dr. Daryll Brod in 2019.   She has noticed mild progression of neuropathy in the feet, which is numb.    Observations/Objective:   There were no vitals filed for this visit. Patient is awake, alert, and appears comfortable.   Extraocular muscles are intact. No ptosis.  Face is symmetric.  Antigravity in all extremities. Finger tapping intact bilaterally. No pronator  drift.  DATA: NCS/EMG of the left hand 06/16/2017:  Left median neuropathy at or distal to the wrist, consistent with the clinical diagnosis of carpal tunnel syndrome. Overall, these findings are very severe in degree electrically.  NCS/EMG of the legs 08/30/2017:  The electrophysiologic most consistent with a chronic sensorimotor axonal polyneuropathy affecting the lower extremities, worse on the left.  Assessment and Plan:  1.  Right > left hand paresthesias, likely carpal tunnel syndrome less likely neuropathy.  Prior NCS/EMG of the left hand from 2019 showed severe CTS and it's possible she is having residual paresthesias, moreso than recurrence in the left hand.  - NCS/EMG of the right > left hand  2.  Idiopathic peripheral neuropathy manifesting with numbness in the feet  - Patient educated on daily foot inspection, fall prevention, and safety precautions around the home.   Follow Up Instructions:   I discussed the assessment and treatment plan with the patient. The patient was provided an opportunity to ask questions and all were answered. The patient agreed with the plan and demonstrated an understanding of the instructions.   The patient was advised to call back or seek an in-person evaluation if the symptoms worsen or if the condition fails to improve as anticipated.    Alda Berthold, DO

## 2019-06-05 ENCOUNTER — Ambulatory Visit: Payer: Medicare Other | Admitting: Physical Therapy

## 2019-06-06 ENCOUNTER — Encounter: Payer: Self-pay | Admitting: Psychiatry

## 2019-06-06 ENCOUNTER — Other Ambulatory Visit: Payer: Self-pay | Admitting: Cardiovascular Disease

## 2019-06-06 ENCOUNTER — Other Ambulatory Visit: Payer: Self-pay

## 2019-06-06 ENCOUNTER — Ambulatory Visit (INDEPENDENT_AMBULATORY_CARE_PROVIDER_SITE_OTHER): Payer: Medicare Other | Admitting: Psychiatry

## 2019-06-06 DIAGNOSIS — F319 Bipolar disorder, unspecified: Secondary | ICD-10-CM | POA: Diagnosis not present

## 2019-06-06 DIAGNOSIS — R7989 Other specified abnormal findings of blood chemistry: Secondary | ICD-10-CM

## 2019-06-06 DIAGNOSIS — G4733 Obstructive sleep apnea (adult) (pediatric): Secondary | ICD-10-CM | POA: Diagnosis not present

## 2019-06-06 DIAGNOSIS — F900 Attention-deficit hyperactivity disorder, predominantly inattentive type: Secondary | ICD-10-CM

## 2019-06-06 DIAGNOSIS — F411 Generalized anxiety disorder: Secondary | ICD-10-CM | POA: Diagnosis not present

## 2019-06-06 MED ORDER — VITAMIN D (ERGOCALCIFEROL) 1.25 MG (50000 UNIT) PO CAPS: 50000.0000 [IU] | ORAL_CAPSULE | ORAL | 1 refills | Status: DC

## 2019-06-06 NOTE — Progress Notes (Signed)
MASAE COMI [id number] [birth year] 69 y.o.  Subjective:   Patient ID:  Jessica [name] is a 69 y.o. (DOB 15-Jul-1950) female.  Chief Complaint:  Chief Complaint  Patient presents with  . Follow-up     Medication Management  . Other    Bipolar 1    HPI  Jessica [name] presents to the office today for follow-up of bipolar disorder and OSA.    Last seen March 07 2019.  She continues to have a background level of irritability that is problematic.  But she requested No meds changed.  H says less bouts of anger but a bit more weepy.  i't s triggered.  Often bc of some action or inaction on his part.  He helps her a lot DT her mobility problems.  She's fearful of falling.  She'll get agitated if he walks off to leave her to do something else while she's walking.   Most of the time coping OK.  Gets tired of staying in so much and she can't drive DT OSA.  She is protesting that bc is some bettter with OSA DT weight loss.    Visitors with D and gkids over [place] and son came later with wife and 3 kids.    Health better with reduced salt and loss  Lost 32# with diuretic.  Aches with less pain med.  Awaken 2-3 times and some EMA at times.  Nocturia is a contributor.    Mood variable from OK to lonely and perturbed over the isolation.  Covid has a negative effect on her mood. Recognizes she gets angry and impatient and directed at husband.   Has had a spell of irritability at times.  Upset she can't drive now DT sleepiness with OSA.   Current depression not severe.  Sleep poor with EMA.  Lies in bed a lot.  Is using CPAP.  Her doctor at [facility] says it's the worst case she's ever seen.  Not driving per the sleep doctor.   Naps and broken sleep.   Gabapentin helps bladder pain. No concerns about meds.  Past Psychiatric Medication Trials: Abilify, Seroquel 600, Depakote, Trileptal, olanzapine,, ziprasidone, venlafaxine, lamotrigine 200,  Ritalin, Lunesta, lithium, lorazepam, Nuvigil,  Vyvanse, Ritalin, citalopram, Wellbutrin,  Ambien,  and others  Review of Systems:  Review of Systems  Constitutional: Positive for fatigue.  Respiratory: Positive for shortness of breath.   Genitourinary: Positive for frequency, pelvic pain and urgency.  Musculoskeletal: Positive for arthralgias, back pain and gait problem.       Uses walker  Neurological: Positive for weakness. Negative for tremors.  Psychiatric/Behavioral: Positive for dysphoric mood. Negative for agitation, behavioral problems, confusion, decreased concentration, hallucinations, self-injury, sleep disturbance and suicidal ideas. The patient is not nervous/anxious and is not hyperactive.     Medications: I have reviewed the patient's current medications.  Current Outpatient Medications  Medication Sig Dispense Refill  . acetaminophen (TYLENOL) 650 MG CR tablet Take 650 mg by mouth every 8 (eight) hours as needed for pain.    . Acetylcysteine 600 MG CAPS Take 600 mg by mouth 2 (two) times daily. NAC    . cycloSPORINE (RESTASIS) 0.05 % ophthalmic emulsion Place 1 drop into both eyes 2 (two) times daily.    . DULoxetine (CYMBALTA) 60 MG capsule TAKE 1 CAPSULE BY MOUTH EVERY DAY **DISCONTINUE SERTRALINE** 90 capsule 0  . gabapentin (NEURONTIN) 300 MG capsule Take 1 capsule (300 mg total) by mouth at bedtime. (Patient taking differently: Take 1,200 mg  by mouth daily. TAKE 1 TABLET IN THE MORNING AND 3 TABLETS AT NIGHT.) 30 capsule 0  . levothyroxine (SYNTHROID) 25 MCG tablet Take 225 mcg by mouth daily before breakfast.     . lisdexamfetamine (VYVANSE) 60 MG capsule Take 1 capsule (60 mg total) by mouth every morning. 30 capsule 0  . lithium carbonate 150 MG capsule TAKE 5 CAPSULES (750 MG TOTAL) BY MOUTH AT BEDTIME. 450 capsule 0  . LORazepam (ATIVAN) 0.5 MG tablet Take 1 tablet (0.5 mg total) by mouth 2 (two) times daily as needed for anxiety. 10 tablet 0  . metoprolol tartrate (LOPRESSOR) 25 MG tablet Take 0.5 tablets  (12.5 mg total) by mouth 2 (two) times daily. 60 tablet 0  . Multiple Vitamins-Minerals (MULTIVITAMIN WITH MINERALS) tablet Take 1 tablet by mouth daily.    . Multiple Vitamins-Minerals (PRESERVISION AREDS 2) CAPS Take 1 capsule 2 (two) times daily by mouth.    . mupirocin ointment (BACTROBAN) 2 % mupirocin 2 % topical ointment  APPLY TO AFFECTED AREA 3 TIMES A DAY FOR 7 DAYS    . potassium chloride SA (KLOR-CON) 20 MEQ tablet Take 1 tablet by mouth every other day on days Torsemide is taken 45 tablet 1  . pravastatin (PRAVACHOL) 20 MG tablet Take 20 mg at bedtime by mouth.     . SYNTHROID 200 MCG tablet TAKE 1 TABLET BY MOUTH EVERY MORNING ON AN EMPTY STOMACH    . Thiamine HCl (VITAMIN B1) 100 MG TABS Take 1 tablet by mouth daily.    Marland Kitchen torsemide (DEMADEX) 20 MG tablet Take 1 tablet by mouth every other day. My take an additional tablet for weight gain of 3 lbs overnight and 5 lbs in a week 45 tablet 1  . vitamin B-12 (CYANOCOBALAMIN) 100 MCG tablet Take 100 mcg by mouth 2 (two) times daily.    . Vitamin D, Ergocalciferol, (DRISDOL) 1.25 MG (50000 UNIT) CAPS capsule Take 1 capsule (50,000 Units total) by mouth every 7 (seven) days. 15 capsule 1  . amLODipine (NORVASC) 10 MG tablet Take 1 tablet (10 mg total) by mouth daily. (Patient not taking: Reported on 06/06/2019) 60 tablet 0   No current facility-administered medications for this visit.    Medication Side Effects: Other: ? sleepiness unlikely related.  Allergies:  Allergies  Allergen Reactions  . Ibuprofen Diarrhea, Nausea Only and Other (See Comments)    Extreme stomach pain  Makes her feel bad  . Codeine     UNSPECIFIED REACTION   . Nabumetone     UNSPECIFIED REACTION   . Promethazine     UNSPECIFIED REACTION   . Amoxicillin-Pot Clavulanate Nausea And Vomiting  . Erythromycin Base Diarrhea  . Promethazine-Codeine Other (See Comments)    "Makes her feel bad"    Past Medical History:  Diagnosis Date  . Arthritis    lower  back, hands, knees  . Bipolar disorder ([place])   . Cellulitis and abscess of left leg 11/29/2018  . CHF (congestive heart failure) ([place])   . Depression   . Dyspnea    with exertion  . GERD (gastroesophageal reflux disease)    patient unsure about this dx - no meds  . Headache   . History of IBS    watches diet  . Hyperlipidemia   . Hypertension   . Hypothyroidism   . Lung edema, acute, with congestive heart failure ([place])   . Obese   . Plantar fasciitis   . Seasonal allergies   . Sleep  apnea 2017   uses CPAP   . Spinal stenosis of lumbar region   . SVD (spontaneous vaginal delivery)    x 2  . Systemic lupus ([place])   . Thyroid disease     Family History  Problem Relation Age of Onset  . Thyroid disease Mother   . Breast cancer Mother   . Heart disease Mother   . Bipolar disorder Father   . Diabetes Father   . Heart disease Father   . Dementia Father     Social History   Socioeconomic History  . Marital status: Married    Spouse name: Not on file  . Number of children: Not on file  . Years of education: Not on file  . Highest education level: Not on file  Occupational History  . Not on file  Tobacco Use  . Smoking status: Never Smoker  . Smokeless tobacco: Never Used  Substance and Sexual Activity  . Alcohol use: Yes    Comment: rarely  . Drug use: Never  . Sexual activity: Not on file  Other Topics Concern  . Not on file  Social History Narrative   She lives in a single level home with her husband.  She has two grown children.   She taught college accounting courses, retired in 2005.    Highest level of education:  Designer, jewellery in [employer]   Social Determinants of Health   Financial Resource Strain:   . Difficulty of Paying Living Expenses: Not on file  Food Insecurity:   . Worried About Charity fundraiser in the Last Year: Not on file  . Ran Out of Food in the Last Year: Not on file  Transportation Needs:   . Lack of Transportation (Medical): Not on  file  . Lack of Transportation (Non-Medical): Not on file  Physical Activity:   . Days of Exercise per Week: Not on file  . Minutes of Exercise per Session: Not on file  Stress:   . Feeling of Stress : Not on file  Social Connections:   . Frequency of Communication with Friends and Family: Not on file  . Frequency of Social Gatherings with Friends and Family: Not on file  . Attends Religious Services: Not on file  . Active Member of Clubs or Organizations: Not on file  . Attends Archivist Meetings: Not on file  . Marital Status: Not on file  Intimate Partner Violence:   . Fear of Current or Ex-Partner: Not on file  . Emotionally Abused: Not on file  . Physically Abused: Not on file  . Sexually Abused: Not on file    Past Medical History, Surgical history, Social history, and Family history were reviewed and updated as appropriate.   5 gkids 15 to 7 mos.  Please see review of systems for further details on the patient's review from today.   Objective:   Physical Exam:  There were no vitals taken for this visit.  Physical Exam Constitutional:      General: She is not in acute distress.    Appearance: She is well-developed. She is obese.  Musculoskeletal:        General: No deformity.  Neurological:     Mental Status: She is alert and oriented to person, place, and time.     Cranial Nerves: No dysarthria.     Motor: Weakness present.     Coordination: Coordination abnormal.     Gait: Gait abnormal.  Psychiatric:  Attention and Perception: Attention and perception normal. She does not perceive auditory or visual hallucinations.        Mood and Affect: Mood is anxious. Mood is not depressed or elated. Affect is not labile, blunt, angry, tearful or inappropriate.        Speech: Speech is not rapid and pressured or slurred.        Behavior: Behavior normal. Behavior is cooperative.        Thought Content: Thought content normal. Thought content is not  paranoid or delusional. Thought content does not include homicidal or suicidal ideation. Thought content does not include homicidal or suicidal plan.        Cognition and Memory: Cognition and memory normal.        Judgment: Judgment normal.     Comments: Insight fair. Tendency to irritability apparently ongoing. Talkative.and speech clearer. Using walker and on O2. Tends to complain about other providers.     Lab Review:     Component Value Date/Time   NA 137 04/16/2019 1613   K 4.8 04/16/2019 1613   CL 102 04/16/2019 1613   CO2 23 04/16/2019 1613   GLUCOSE 115 (H) 04/16/2019 1613   GLUCOSE 100 (H) 01/29/2019 1656   BUN 27 04/16/2019 1613   CREATININE 0.99 04/16/2019 1613   CREATININE 0.90 05/15/2018 1118   CALCIUM 10.9 (H) 04/16/2019 1613   PROT 6.6 04/16/2019 1613   ALBUMIN 4.0 04/16/2019 1613   AST 22 04/16/2019 1613   ALT 17 04/16/2019 1613   ALKPHOS 146 (H) 04/16/2019 1613   BILITOT 0.2 04/16/2019 1613   GFRNONAA 58 (L) 04/16/2019 1613   GFRNONAA 66 05/15/2018 1118   GFRAA 67 04/16/2019 1613   GFRAA 76 05/15/2018 1118       Component Value Date/Time   WBC 16.7 (H) 12/11/2018 0753   RBC 4.62 12/11/2018 0753   HGB 14.4 12/11/2018 0753   HCT 47.6 (H) 12/11/2018 0753   PLT 337 12/11/2018 0753   MCV 103.0 (H) 12/11/2018 0753   MCH 31.2 12/11/2018 0753   MCHC 30.3 12/11/2018 0753   RDW 13.3 12/11/2018 0753   LYMPHSABS 2.0 12/11/2018 0753   MONOABS 1.7 (H) 12/11/2018 0753   EOSABS 0.5 12/11/2018 0753   BASOSABS 0.1 12/11/2018 0753    Lithium Lvl  Date Value Ref Range Status  04/16/2019 0.6 0.6 - 1.2 mmol/L Final    Comment:                                     Detection Limit = 0.1                           <0.1 indicates None Detected     Lithium 1.0 on 04/06/18 and other labs that date.   No results found for: PHENYTOIN, PHENOBARB, VALPROATE, CBMZ   .res Assessment: Plan:    Bipolar I disorder ([place])  Attention deficit hyperactivity disorder  (ADHD), predominantly inattentive type  Generalized anxiety disorder  Obstructive sleep apnea  Low vitamin D level - Plan: Vitamin D, Ergocalciferol, (DRISDOL) 1.25 MG (50000 UNIT) CAPS capsule  Very severe.   Underlying chronic narcissim is compromising her stability.  Easily offended.  Mild Cognitive Impairment stable and appears better with O2  Emphasized the importance of CPAP as she struggels with the treatment.  Talked about the effect of OSA on the brain.  Overall mood stability is Ok with less lithium at 600 mg daily. Lithium level is good.  BMP stable except watch hypercalcemia.  She has not done adequately well with alternative mood stabilizers.  Disc lab tests.  Disc need to monitor hypercalcemia.  Counseled patient regarding potential benefits, risks, and side effects of lithium to include potential risk of lithium affecting thyroid and renal function.  Discussed need for periodic lab monitoring to determine drug level and to assess for potential adverse effects.  Counseled patient regarding signs and symptoms of lithium toxicity and advised that they notify office immediately or seek urgent medical attention if experiencing these signs and symptoms.  Patient advised to contact office with any questions or concerns.  Lithium level lower for unclear reasons  0.6 April 20, 2019.  On 600 mg daily apparently.  At appointment late  2020 we reduced it to 600 mg daily.  She is not 100% sure that is the dose she is taking but she believes it is the 600 mg dosage.  Creatinine OK.  Calcium stable high at 10.9.  11.1 in October we will need to keep an eye on calcium. Discussed drug and salt interactions with lithium and especially around her diuretic medication.  She is taking torsemide which is less prone to interact than the thiazide diuretics.  Disc irritability that she acknowledges. Option Latuda for irritability and anger outbursts if necessary .  She wants to defer.  There is a  question about whether her insurance would cover it.  Also she does not really want to add another medication because she takes so many already. Asks about anger management.  We discussed these techniques.  Continue duloxetine 60.   Disc risk of mood cycling.    Discussed potential benefits, risks, and side effects of stimulants with patient to include increased heart rate, palpitations, insomnia, increased anxiety, increased irritability, or decreased appetite.  Instructed patient to contact office if experiencing any significant tolerability issues.  I do not believe that the stimulant is causing irritability.  Disc low vitamin D and importance for memory and depression to get that level up to the 50s.  She is also on weekly Vitamin D.  No med changes today except it appeared there may have been an error in the attempted prescription of the vitamin D.  I sent it in again to make sure it was available to her.  This was originally prescribed by her cardiologist Dr. Davina Poke.  This appt was 30 mins.   FU 3 mos  Lynder Parents, MD, DFAPA   Please see After Visit Summary for patient specific instructions.  Future Appointments  Date Time Provider [place]  06/12/2019  2:15 PM Carney Living, PT [facility]  06/19/2019  2:15 PM Carney Living, PT [facility]  06/20/2019  2:15 PM Narda Amber K, DO [facility] None  07/02/2019  4:10 PM [facility] 3 [facility]  07/19/2019  1:30 PM Deveshwar, Abel Presto, MD [facility] None    No orders of the defined types were placed in this encounter.     -------------------------------

## 2019-06-12 ENCOUNTER — Encounter: Payer: Self-pay | Admitting: Physical Therapy

## 2019-06-12 ENCOUNTER — Ambulatory Visit: Payer: Medicare Other | Attending: Family Medicine | Admitting: Physical Therapy

## 2019-06-12 ENCOUNTER — Other Ambulatory Visit: Payer: Self-pay

## 2019-06-12 ENCOUNTER — Telehealth: Payer: Self-pay | Admitting: Cardiovascular Disease

## 2019-06-12 ENCOUNTER — Telehealth: Payer: Self-pay | Admitting: Psychiatry

## 2019-06-12 DIAGNOSIS — G8929 Other chronic pain: Secondary | ICD-10-CM | POA: Diagnosis not present

## 2019-06-12 DIAGNOSIS — M6281 Muscle weakness (generalized): Secondary | ICD-10-CM | POA: Insufficient documentation

## 2019-06-12 DIAGNOSIS — M62830 Muscle spasm of back: Secondary | ICD-10-CM | POA: Diagnosis not present

## 2019-06-12 DIAGNOSIS — R262 Difficulty in walking, not elsewhere classified: Secondary | ICD-10-CM | POA: Diagnosis not present

## 2019-06-12 DIAGNOSIS — M545 Low back pain, unspecified: Secondary | ICD-10-CM

## 2019-06-12 DIAGNOSIS — F900 Attention-deficit hyperactivity disorder, predominantly inattentive type: Secondary | ICD-10-CM

## 2019-06-12 MED ORDER — LISDEXAMFETAMINE DIMESYLATE 60 MG PO CAPS: 60.0000 mg | ORAL_CAPSULE | ORAL | 0 refills | Status: DC

## 2019-06-12 MED ORDER — TORSEMIDE 20 MG PO TABS: ORAL_TABLET | ORAL | 3 refills | Status: DC

## 2019-06-12 NOTE — Telephone Encounter (Signed)
Pt c/o medication issue:  1. Name of Medication: torsemide (DEMADEX) 20 MG tablet  2. How are you currently taking this medication (dosage and times per day)? Every other day  3. Are you having a reaction (difficulty breathing--STAT)? no  4. What is your medication issue? Patient states she had been taking the medication everyday, but began taking it every other day last week. She says she has since started taking it everyday again, because her sandals were not fitting well. She says she would like a new prescription for the medication for everyday.

## 2019-06-12 NOTE — Telephone Encounter (Signed)
Returned call to patient.She stated she wanted to ask Dr.O'Neal if ok to take Torsemide 20 mg every day.Stated she decreased to 20 mg every other day last Mon and she has increased swelling in both lower legs and feet.Advised I will send message to Dr.O'Neal for advice.

## 2019-06-12 NOTE — Telephone Encounter (Signed)
Spoke to patient Dr.O'Neal advised ok to take Torsemide 20 mg daily.Advised to call back if she continues to have swelling.

## 2019-06-12 NOTE — Telephone Encounter (Signed)
Last refill 05/14/2019, pended 3 Rx's for Dr. Clovis Pu to submit

## 2019-06-12 NOTE — Telephone Encounter (Signed)
Ok to take daily. Thanks Malachy Mood!  Evalina Field, MD

## 2019-06-12 NOTE — Telephone Encounter (Signed)
Patient called and needs a refill on her vyvanse 60 mg to be sent to [facility] on [address]. She would like 3 months sent in . Her next appt is 5/4

## 2019-06-13 DIAGNOSIS — R200 Anesthesia of skin: Secondary | ICD-10-CM | POA: Diagnosis not present

## 2019-06-13 DIAGNOSIS — M47812 Spondylosis without myelopathy or radiculopathy, cervical region: Secondary | ICD-10-CM | POA: Diagnosis not present

## 2019-06-13 DIAGNOSIS — M180 Bilateral primary osteoarthritis of first carpometacarpal joints: Secondary | ICD-10-CM | POA: Diagnosis not present

## 2019-06-13 NOTE — Therapy (Signed)
[place], [place], [address] Phone: [phone]   Fax:  [phone]  Physical Therapy Treatment  Patient Details  Name: Jessica [name] MRN: [id number] Date of Birth: 27-May-1950 Referring Provider (PT): Donnie Coffin MD    Encounter Date: 06/12/2019  PT End of Session - 06/13/19 1616    Visit Number  2    Number of Visits  6    Date for PT Re-Evaluation  07/03/19    Authorization Type  medicare progress note after 10 visits    PT Start Time  [id number]    PT Stop Time  [id number]    PT Time Calculation (min)  38 min    Activity Tolerance  Patient limited by fatigue    Behavior During Therapy  [facility] for tasks assessed/performed       Past Medical History:  Diagnosis Date  . Arthritis    lower back, hands, knees  . Bipolar disorder ([place])   . Cellulitis and abscess of left leg 11/29/2018  . CHF (congestive heart failure) ([place])   . Depression   . Dyspnea    with exertion  . GERD (gastroesophageal reflux disease)    patient unsure about this dx - no meds  . Headache   . History of IBS    watches diet  . Hyperlipidemia   . Hypertension   . Hypothyroidism   . Lung edema, acute, with congestive heart failure ([place])   . Obese   . Plantar fasciitis   . Seasonal allergies   . Sleep apnea 2017   uses CPAP   . Spinal stenosis of lumbar region   . SVD (spontaneous vaginal delivery)    x 2  . Systemic lupus ([place])   . Thyroid disease     Past Surgical History:  Procedure Laterality Date  . CARPAL TUNNEL RELEASE Left 09/16/2017   Procedure: LEFT CARPAL TUNNEL RELEASE;  Surgeon: Daryll Brod, MD;  Location: [facility] [place];  Service: Orthopedics;  Laterality: Left;  . carpel tunnel surgery Right   . CATARACT EXTRACTION Bilateral 2007   w/ lens implants  . COLONOSCOPY    . DILATION AND CURETTAGE OF UTERUS    . EYE SURGERY    . FOOT SURGERY Right    hammer toe  . FRACTURE SURGERY     R tibia and fibula  . LEG SURGERY  Right   . TONSILLECTOMY    . WISDOM TOOTH EXTRACTION      There were no vitals filed for this visit.  Subjective Assessment - 06/13/19 1612    Subjective  Patient has had some increased swelling 2nd to loweringher lasix. That has made her mobility a little harder. She has worked on her exercises.    Limitations  Sitting;Lifting;Standing;Walking;House hold activities    How long can you sit comfortably?  any prolonged poistioning causes pain    How long can you stand comfortably?  3-4 minutes before she has too much discomfort    How long can you walk comfortably?  sugnificant dyspnea coming from the lobby to a treatment room    Patient Stated Goals  to be able to walk further more comfortably    Currently in Pain?  Yes    Pain Score  7     Pain Location  Back    Pain Orientation  Right;Left    Pain Descriptors / Indicators  Aching    Pain Type  Chronic pain    Pain Onset  More  than a month ago    Pain Frequency  Intermittent    Aggravating Factors   standing and walking    Pain Relieving Factors  changing positions    Effect of Pain on Daily Activities  pain with all activity                       [facility] Adult PT Treatment/Exercise - 06/13/19 0001      Self-Care   Self-Care  ADL's;Posture;Other Self-Care Comments    Other Self-Care Comments   reviewed purchase of pannis sling and potential use in the car.  Reviewed use of HEP; reviewed the improtance of walking for exercise.       Lumbar Exercises: Seated   Other Seated Lumbar Exercises  LAQ with posture 3x10 each leg; hamstring curls 2x15 each leg; seated hip abduction 2x15 bilateral     Other Seated Lumbar Exercises  hell raises seated 2x10 each leg              PT Education - 06/12/19 1449    Education Details  updated HEP    Person(s) Educated  Patient    Methods  Explanation;Demonstration;Tactile cues;Verbal cues       PT Short Term Goals - 05/22/19 1306      PT SHORT TERM GOAL #1   Title   Patient will be indepdnent with very basic HEP    Time  3    Period  Weeks    Status  New    Target Date  06/12/19      PT SHORT TERM GOAL #2   Title  Patient will ambualte 150' without a seated rest break    Time  3    Period  Weeks    Status  New    Target Date  06/12/19      PT SHORT TERM GOAL #3   Title  Pt will state 3 pain management strategies or proper posture /body mechanics for household chores    Time  3    Period  Weeks    Status  On-going    Target Date  06/12/19        PT Long Term Goals - 05/22/19 1307      PT LONG TERM GOAL #1   Title  Patient will transfer sit to stand witout dyspnea and pain    Time  6    Period  Weeks    Status  New    Target Date  07/03/19      PT LONG TERM GOAL #2   Title  Patient will ambualte 300' without a rest break on 2 min walk test in order to improve community ambualtion    Time  6    Period  Weeks    Status  New    Target Date  07/03/19      PT LONG TERM GOAL #3   Title  Patient will stand a t home for 20 min without self reported increased lower back pain    Time  6    Period  Weeks    Status  New    Target Date  07/03/19            Plan - 06/12/19 1615    Clinical Impression Statement  Patient tolerated treatment well. Therapoy gave her upper body exercisesfor home. She has low endurance but was ableto complete all tasks asked of her. She is having some numbness and pain in her hand.  She was given a wrist extension stretch with strict instructions to stop if she feels increased symptoms. She is having a hard time driving 2nd to her pannus. Therapy reviewed pannus slings with her but she is not sure if that would work. While going to the bathroom after the treamtnet she was pinched by the seat of the toilet. She reported pain but does not think she was injured. Therapy will put in a fixit ticket to maintenance.    Personal Factors and Comorbidities  Comorbidity 1;Comorbidity 2;Comorbidity 3+    Comorbidities  HTN,  OA, plantar facitis, dyspnea, bi-polar    Examination-Activity Limitations  Stand;Stairs;Squat;Sit;Continence;Dressing;Hygiene/Grooming;Lift;Locomotion Level;Bed Mobility;Bend    Examination-Participation Restrictions  Meal Prep;Cleaning;Community Activity;Driving;Shop;Laundry;Yard Work    Stability/Clinical Decision Making  Unstable/Unpredictable    International aid/development worker    Rehab Potential  Poor    PT Frequency  1x / week    PT Duration  6 weeks    PT Treatment/Interventions  ADLs/Self Care Home Management;Cryotherapy;Electrical Stimulation;Iontophoresis 4mg /ml Dexamethasone;Gait training;Moist Heat;DME Instruction;Stair training;Functional mobility training;Therapeutic activities;Therapeutic exercise;Neuromuscular re-education;Manual techniques;Patient/family education;Passive range of motion;Taping    PT Next Visit Plan  funbctional strengthening may be the best option; sit to stand transfers, standing in II bars with weight shifting and marching. Any seated exercises.    PT Home Exercise Plan  seated bilateral ER seated low range horizontal abduction ankle pumps, low range LAQ. Patient advised noty to perfrom until she sees MD. Patient just shown how to do the exercises       Patient will benefit from skilled therapeutic intervention in order to improve the following deficits and impairments:  Abnormal gait, Decreased range of motion, Increased fascial restricitons, Increased muscle spasms, Impaired UE functional use, Decreased knowledge of use of DME, Decreased mobility, Decreased strength, Improper body mechanics, Pain, Impaired perceived functional ability, Decreased safety awareness, Decreased endurance  Visit Diagnosis: Chronic bilateral low back pain without sciatica  Muscle spasm of back  Difficulty in walking, not elsewhere classified  Muscle weakness (generalized)     Problem List Patient Active Problem List   Diagnosis Date Noted  . Chronic diastolic heart  failure ([place])   . Systemic lupus ([place])   . Morbid obesity due to excess calories ([place])   . Bipolar disorder ([place])   . Cellulitis 11/29/2018  . OSA (obstructive sleep apnea) 11/27/2018  . Chronic hypoxemic respiratory failure ([place]) 11/27/2018  . Primary osteoarthritis of both hands 04/19/2017  . Primary osteoarthritis of both knees 04/19/2017  . Primary osteoarthritis of both feet 04/19/2017  . DDD (degenerative disc disease), lumbar 04/19/2017  . History of bipolar disorder 04/19/2017  . Hypothyroidism 06/04/2014  . HTN (hypertension) 01/23/2013  . HLD (hyperlipidemia) 01/23/2013    Carney Living PT DPT  06/13/2019, 4:20 PM  [facility] [address] [place], [place], [address] Phone: [phone]   Fax:  [phone]  Name: Jessica [name] MRN: [id number] Date of Birth: 1950-10-14

## 2019-06-19 ENCOUNTER — Ambulatory Visit: Payer: Medicare Other | Admitting: Physical Therapy

## 2019-06-20 ENCOUNTER — Other Ambulatory Visit: Payer: Self-pay

## 2019-06-20 ENCOUNTER — Ambulatory Visit (INDEPENDENT_AMBULATORY_CARE_PROVIDER_SITE_OTHER): Payer: Medicare Other | Admitting: Neurology

## 2019-06-20 DIAGNOSIS — G5603 Carpal tunnel syndrome, bilateral upper limbs: Secondary | ICD-10-CM

## 2019-06-20 DIAGNOSIS — N950 Postmenopausal bleeding: Secondary | ICD-10-CM | POA: Diagnosis not present

## 2019-06-20 NOTE — Procedures (Signed)
[facility]  [place], [place]  [place], [place] [address] Tel: [phone] Fax:  [phone] Test Date:  06/20/2019  Patient: Jessica [name] DOB: 10/11/1950 Physician: Narda Amber, DO  Sex: Female Height: 5\' 2"  Ref Phys: Narda Amber, DO  ID#: [id number] Temp: 32.0C Technician:    Patient Complaints: This is a 69 year old female with history of bilateral CTS release referred for evaluation of bilateral hand numbness and tingling.  NCV & EMG Findings: Extensive electrodiagnostic testing of the right upper extremity and additional studies of the left shows:  1. Bilateral median sensory responses show prolonged latency (R6.0, L4.5 ms) and reduced amplitude (R7.1, L5.4 V).  Left median motor response was previously absent on study dated 06/16/2017.  Bilateral ulnar and radial sensory responses are within normal limits. 2. Bilateral median motor responses show prolonged latency (R7.0, L4.9 ms) and reduced amplitude (R2.5, 4.7 mV).  Bilateral ulnar motor responses are within normal limits. 3. Chronic motor axonal loss changes are seen affecting bilateral abductor pollicis brevis muscles, without accompanied active denervation.    Impression: Bilateral median neuropathy at or distal to the wrist (severe), consistent with a clinical diagnosis of carpal tunnel syndrome.  As compared to prior study on 06/16/2017 of the left upper extremity, there has been mild improvement, however findings remain severe.  There is no evidence of a sensorimotor polyneuropathy affecting the upper extremities.   ___________________________ Narda Amber, DO    Nerve Conduction Studies Anti Sensory Summary Table   Site NR Peak (ms) Norm Peak (ms) P-T Amp (V) Norm P-T Amp  Left Median Anti Sensory (2nd Digit)  32C  Wrist    4.5 <3.8 5.4 >10  Right Median Anti Sensory (2nd Digit)  32C  Wrist    6.0 <3.8 7.1 >10  Left Radial Anti Sensory (Base 1st Digit)  32C  Wrist    2.3 <2.8 16.5  >10  Right Radial Anti Sensory (Base 1st Digit)  32C  Wrist    2.2 <2.8 14.1 >10  Left Ulnar Anti Sensory (5th Digit)  32C  Wrist    2.9 <3.2 11.0 >5  Right Ulnar Anti Sensory (5th Digit)  32C  Wrist    3.2 <3.2 11.8 >5   Motor Summary Table   Site NR Onset (ms) Norm Onset (ms) O-P Amp (mV) Norm O-P Amp Site1 Site2 Delta-0 (ms) Dist (cm) Vel (m/s) Norm Vel (m/s)  Left Median Motor (Abd Poll Brev)  32C  Wrist    4.9 <4.0 4.7 >5 Elbow Wrist 5.2 30.0 58 >50  Elbow    10.1  3.9         Right Median Motor (Abd Poll Brev)  32C  Wrist    7.0 <4.0 2.5 >5 Elbow Wrist 5.9 30.0 51 >50  Elbow    12.9  2.3         Left Ulnar Motor (Abd Dig Minimi)  32C  Wrist    2.7 <3.1 8.5 >7 B Elbow Wrist 3.9 22.0 56 >50  B Elbow    6.6  7.7  A Elbow B Elbow 1.8 10.0 56 >50  A Elbow    8.4  7.5         Right Ulnar Motor (Abd Dig Minimi)  32C  Wrist    3.0 <3.1 8.2 >7 B Elbow Wrist 4.2 22.0 52 >50  B Elbow    7.2  7.8  A Elbow B Elbow 1.8 10.0 56 >50  A Elbow    9.0  7.4          EMG   Side Muscle Ins Act Fibs Psw Fasc Number Recrt Dur Dur. Amp Amp. Poly Poly. Comment  Right 1stDorInt Nml Nml Nml Nml Nml Nml Nml Nml Nml Nml Nml Nml N/A  Right Abd Poll Brev Nml Nml Nml Nml 3- Rapid Many 1+ Many 1+ Many 1+ N/A  Right PronatorTeres Nml Nml Nml Nml Nml Nml Nml Nml Nml Nml Nml Nml N/A  Right Biceps Nml Nml Nml Nml Nml Nml Nml Nml Nml Nml Nml Nml N/A  Right Triceps Nml Nml Nml Nml Nml Nml Nml Nml Nml Nml Nml Nml N/A  Right Deltoid Nml Nml Nml Nml Nml Nml Nml Nml Nml Nml Nml Nml N/A  Left 1stDorInt Nml Nml Nml Nml Nml Nml Nml Nml Nml Nml Nml Nml N/A  Left PronatorTeres Nml Nml Nml Nml Nml Nml Nml Nml Nml Nml Nml Nml N/A  Left Triceps Nml Nml Nml Nml Nml Nml Nml Nml Nml Nml Nml Nml N/A  Left Biceps Nml Nml Nml Nml Nml Nml Nml Nml Nml Nml Nml Nml N/A  Left Deltoid Nml Nml Nml Nml Nml Nml Nml Nml Nml Nml Nml Nml N/A  Left Abd Poll Brev Nml Nml Nml Nml 3- Rapid Many 1+ Most 1+ Most 1+ N/A       Waveforms:

## 2019-06-24 ENCOUNTER — Ambulatory Visit: Payer: Medicare Other | Attending: Internal Medicine

## 2019-06-24 DIAGNOSIS — Z23 Encounter for immunization: Secondary | ICD-10-CM

## 2019-06-24 NOTE — Progress Notes (Signed)
   Covid-19 Vaccination Clinic  Name:  Jessica [name]    MRN: [id number] DOB: 07/08/1950  06/24/2019  [name] was observed post Covid-19 immunization for 15 minutes without incidence. She was provided with Vaccine Information Sheet and instruction to access the V-Safe system.   [name] was instructed to call 911 with any severe reactions post vaccine: Marland Kitchen Difficulty breathing  . Swelling of your face and throat  . A fast heartbeat  . A bad rash all over your body  . Dizziness and weakness    Immunizations Administered    Name Date Dose VIS Date Route   Pfizer COVID-19 Vaccine 06/24/2019  3:12 PM 0.3 mL 04/13/2019 Intramuscular   Manufacturer: [place]   Lot: [id number]   [place]: [id number]

## 2019-06-28 ENCOUNTER — Ambulatory Visit: Payer: Medicare Other | Admitting: Physical Therapy

## 2019-06-29 ENCOUNTER — Telehealth: Payer: Self-pay | Admitting: Pulmonary Disease

## 2019-06-29 NOTE — Telephone Encounter (Signed)
I am sure [employer] just like all the other dme's is out of poc's right now they are not able to get them  Pt cn call [facility] and ask them the status [phone] thanks Joellen Jersey

## 2019-06-29 NOTE — Telephone Encounter (Signed)
Spoke with the pt and notified of response per [facility] and she verbalized understanding.

## 2019-06-29 NOTE — Telephone Encounter (Signed)
Forwarding to [facility] per protocol since we ordered on 04/09/19 thanks!

## 2019-07-02 ENCOUNTER — Other Ambulatory Visit: Payer: Self-pay

## 2019-07-02 ENCOUNTER — Ambulatory Visit
Admission: RE | Admit: 2019-07-02 | Discharge: 2019-07-02 | Disposition: A | Discharge status: Home / Self Care | Payer: Medicare Other | Source: Ambulatory Visit | Attending: Family Medicine | Admitting: Family Medicine

## 2019-07-02 DIAGNOSIS — Z1231 Encounter for screening mammogram for malignant neoplasm of breast: Secondary | ICD-10-CM | POA: Diagnosis not present

## 2019-07-03 ENCOUNTER — Telehealth: Payer: Self-pay | Admitting: Psychiatry

## 2019-07-03 ENCOUNTER — Ambulatory Visit: Payer: Medicare Other | Attending: Family Medicine | Admitting: Physical Therapy

## 2019-07-03 DIAGNOSIS — M5416 Radiculopathy, lumbar region: Secondary | ICD-10-CM | POA: Insufficient documentation

## 2019-07-03 DIAGNOSIS — G8929 Other chronic pain: Secondary | ICD-10-CM | POA: Insufficient documentation

## 2019-07-03 DIAGNOSIS — R262 Difficulty in walking, not elsewhere classified: Secondary | ICD-10-CM | POA: Insufficient documentation

## 2019-07-03 DIAGNOSIS — M6281 Muscle weakness (generalized): Secondary | ICD-10-CM | POA: Diagnosis not present

## 2019-07-03 DIAGNOSIS — M545 Low back pain: Secondary | ICD-10-CM | POA: Insufficient documentation

## 2019-07-03 DIAGNOSIS — M62830 Muscle spasm of back: Secondary | ICD-10-CM | POA: Insufficient documentation

## 2019-07-03 NOTE — Telephone Encounter (Signed)
RTC to [facility], informed her to check into [email or web address] which is a great company been around for years or they also have it available through [employer]. NAC 600 mg bid

## 2019-07-03 NOTE — Telephone Encounter (Signed)
Jessica [name] used to buy her NAC at [facility] but now they told her its off the market. She has seen some available on internet, but wants to know what NAC you recommend?

## 2019-07-04 ENCOUNTER — Encounter: Payer: Self-pay | Admitting: Physical Therapy

## 2019-07-04 NOTE — Therapy (Signed)
[place], [place], [address] Phone: [phone]   Fax:  [phone]  Physical Therapy Treatment  Patient Details  Name: Jessica [name] MRN: [id number] Date of Birth: 1950-07-25 Referring Provider (PT): Donnie Coffin MD    Encounter Date: 07/03/2019  PT End of Session - 07/04/19 0842    Visit Number  3    Number of Visits  6    Date for PT Re-Evaluation  07/03/19    Authorization Type  medicare progress note after 10 visits    PT Start Time  [id number]    PT Stop Time  1456    PT Time Calculation (min)  41 min    Activity Tolerance  Patient limited by fatigue    Behavior During Therapy  [facility] for tasks assessed/performed       Past Medical History:  Diagnosis Date  . Arthritis    lower back, hands, knees  . Bipolar disorder ([place])   . Cellulitis and abscess of left leg 11/29/2018  . CHF (congestive heart failure) ([place])   . Depression   . Dyspnea    with exertion  . GERD (gastroesophageal reflux disease)    patient unsure about this dx - no meds  . Headache   . History of IBS    watches diet  . Hyperlipidemia   . Hypertension   . Hypothyroidism   . Lung edema, acute, with congestive heart failure ([place])   . Obese   . Plantar fasciitis   . Seasonal allergies   . Sleep apnea 2017   uses CPAP   . Spinal stenosis of lumbar region   . SVD (spontaneous vaginal delivery)    x 2  . Systemic lupus ([place])   . Thyroid disease     Past Surgical History:  Procedure Laterality Date  . CARPAL TUNNEL RELEASE Left 09/16/2017   Procedure: LEFT CARPAL TUNNEL RELEASE;  Surgeon: Daryll Brod, MD;  Location: [facility];  Service: Orthopedics;  Laterality: Left;  . carpel tunnel surgery Right   . CATARACT EXTRACTION Bilateral 2007   w/ lens implants  . COLONOSCOPY    . DILATION AND CURETTAGE OF UTERUS    . EYE SURGERY    . FOOT SURGERY Right    hammer toe  . FRACTURE SURGERY     R tibia and fibula  . LEG  SURGERY Right   . TONSILLECTOMY    . WISDOM TOOTH EXTRACTION      There were no vitals filed for this visit.  Subjective Assessment - 07/04/19 0839    Subjective  Patient reports back pain when she stands for too long. She feels like it may be getting a little better but not [name]. She is trying to walk as [name] as possible.    Limitations  Sitting;Lifting;Standing;Walking;House hold activities    How long can you sit comfortably?  any prolonged poistioning causes pain    How long can you stand comfortably?  3-4 minutes before she has too [name] discomfort    How long can you walk comfortably?  sugnificant dyspnea coming from the lobby to a treatment room    Patient Stated Goals  to be able to walk further more comfortably    Currently in Pain?  Yes    Pain Score  6     Pain Location  Back    Pain Orientation  Right;Left    Pain Descriptors / Indicators  Aching    Pain Type  Chronic  pain    Pain Onset  More than a month ago    Pain Frequency  Intermittent    Aggravating Factors   standing and walking    Pain Relieving Factors  changing position    Effect of Pain on Daily Activities  pain with activity                       [facility] Adult PT Treatment/Exercise - 07/04/19 0001      Self-Care   Other Self-Care Comments   reviewed types of commode lifts and which ones may be better for her. Also reviewed gait pattenr. She was encouraged to keep strengthening and keep up with her weight loss and that would be her best idea to help       Lumbar Exercises: Standing   Other Standing Lumbar Exercises  standing weight shfit 2x15; low marches with emphasis on knee flexion       Lumbar Exercises: Seated   Other Seated Lumbar Exercises  LAQ with posture 3x10 each leg; hamstring curls 2x15 each leg; seated hip abduction 2x15 bilateral     Other Seated Lumbar Exercises  hell raises seated 2x10 each leg              PT Education - 07/04/19 0841    Education Details  reviewed  HEP and symptom mangement    Person(s) Educated  Patient    Methods  Explanation;Demonstration;Tactile cues;Verbal cues    Comprehension  Verbalized understanding;Returned demonstration;Verbal cues required;Tactile cues required;Need further instruction       PT Short Term Goals - 05/22/19 1306      PT SHORT TERM GOAL #1   Title  Patient will be indepdnent with very basic HEP    Time  3    Period  Weeks    Status  New    Target Date  06/12/19      PT SHORT TERM GOAL #2   Title  Patient will ambualte 150' without a seated rest break    Time  3    Period  Weeks    Status  New    Target Date  06/12/19      PT SHORT TERM GOAL #3   Title  Pt will state 3 pain management strategies or proper posture /body mechanics for household chores    Time  3    Period  Weeks    Status  On-going    Target Date  06/12/19        PT Long Term Goals - 05/22/19 1307      PT LONG TERM GOAL #1   Title  Patient will transfer sit to stand witout dyspnea and pain    Time  6    Period  Weeks    Status  New    Target Date  07/03/19      PT LONG TERM GOAL #2   Title  Patient will ambualte 300' without a rest break on 2 min walk test in order to improve community ambualtion    Time  6    Period  Weeks    Status  New    Target Date  07/03/19      PT LONG TERM GOAL #3   Title  Patient will stand a t home for 20 min without self reported increased lower back pain    Time  6    Period  Weeks    Status  New    Target  Date  07/03/19            Plan - 07/04/19 0843    Clinical Impression Statement  Patient is making making some progress. Therapy was able to add light standing exercises. She was fatigued but able to complete the activity. She was educated on her gait pattenr. She is concerned that she has a lot of lateral movement with her gait but there dosent appear to be [name] she can do at this point 2nd to large habitus and quad weakness. She was enciuraged to continue strengthening. She  has also lost 30 lbs since the winter. She would like to get a raised commode. Therapy reviewed types of commodes with her and what may be best. Therapy will continue to advanace as tolerated.    Personal Factors and Comorbidities  Comorbidity 1;Comorbidity 2;Comorbidity 3+    Comorbidities  HTN, OA, plantar facitis, dyspnea, bi-polar    Examination-Activity Limitations  Stand;Stairs;Squat;Sit;Continence;Dressing;Hygiene/Grooming;Lift;Locomotion Level;Bed Mobility;Bend    Examination-Participation Restrictions  Meal Prep;Cleaning;Community Activity;Driving;Shop;Laundry;Yard Work    Stability/Clinical Decision Making  Unstable/Unpredictable    International aid/development worker    Rehab Potential  Poor    PT Frequency  1x / week    PT Duration  6 weeks    PT Treatment/Interventions  ADLs/Self Care Home Management;Cryotherapy;Electrical Stimulation;Iontophoresis 4mg /ml Dexamethasone;Gait training;Moist Heat;DME Instruction;Stair training;Functional mobility training;Therapeutic activities;Therapeutic exercise;Neuromuscular re-education;Manual techniques;Patient/family education;Passive range of motion;Taping    PT Next Visit Plan  funbctional strengthening may be the best option; sit to stand transfers, standing in II bars with weight shifting and marching. Any seated exercises. Consdier seated ex ball exercises.    PT Home Exercise Plan  seated bilateral ER seated low range horizontal abduction ankle pumps, low range LAQ. Patient advised noty to perfrom until she sees MD. Patient just shown how to do the exercises    Consulted and Agree with Plan of Care  Patient       Patient will benefit from skilled therapeutic intervention in order to improve the following deficits and impairments:  Abnormal gait, Decreased range of motion, Increased fascial restricitons, Increased muscle spasms, Impaired UE functional use, Decreased knowledge of use of DME, Decreased mobility, Decreased strength, Improper body  mechanics, Pain, Impaired perceived functional ability, Decreased safety awareness, Decreased endurance  Visit Diagnosis: Chronic bilateral low back pain without sciatica  Muscle spasm of back  Difficulty in walking, not elsewhere classified  Muscle weakness (generalized)     Problem List Patient Active Problem List   Diagnosis Date Noted  . Chronic diastolic heart failure ([place])   . Systemic lupus ([place])   . Morbid obesity due to excess calories ([place])   . Bipolar disorder ([place])   . Cellulitis 11/29/2018  . OSA (obstructive sleep apnea) 11/27/2018  . Chronic hypoxemic respiratory failure ([place]) 11/27/2018  . Primary osteoarthritis of both hands 04/19/2017  . Primary osteoarthritis of both knees 04/19/2017  . Primary osteoarthritis of both feet 04/19/2017  . DDD (degenerative disc disease), lumbar 04/19/2017  . History of bipolar disorder 04/19/2017  . Hypothyroidism 06/04/2014  . HTN (hypertension) 01/23/2013  . HLD (hyperlipidemia) 01/23/2013    Carney Living PT DPT  07/04/2019, 8:49 AM  [facility] [address] [place], [place], [address] Phone: [phone]   Fax:  [phone]  Name: Jessica [name] MRN: [id number] Date of Birth: [birth year]

## 2019-07-06 DIAGNOSIS — Z01812 Encounter for preprocedural laboratory examination: Secondary | ICD-10-CM | POA: Diagnosis not present

## 2019-07-06 DIAGNOSIS — Z20822 Contact with and (suspected) exposure to covid-19: Secondary | ICD-10-CM | POA: Diagnosis not present

## 2019-07-06 DIAGNOSIS — G4733 Obstructive sleep apnea (adult) (pediatric): Secondary | ICD-10-CM | POA: Diagnosis not present

## 2019-07-09 ENCOUNTER — Telehealth: Payer: Self-pay | Admitting: Cardiovascular Disease

## 2019-07-09 NOTE — Telephone Encounter (Signed)
Spoke with patient. Patient reports her weight has been up to 294 recently. Patient does not weigh daily or in the morning routinely as she gets up at different times and suffers from incontinence.   Patient reports swelling in her legs, feet and ankles. She reports over the last few weeks she may have developed abdominal swelling as well. Patient does not elevate her lower extremities.   Patient noticeably short of breath on the phone. Patient reports shortness of breath with exertion.   Patient also reports she has been having palpitations with minimal exertion. No chest pain or dizziness. The palpitations are now occurring daily.   Patient advised to elevate her lower extremities. Patient to monitor shortness of breath and palpitations. If symptoms worsen or if she develops chest pain patient to report to the nearest emergency room. Patient placed on Dr. Heather Roberts schedule for Tuesday morning at 9:40am.

## 2019-07-09 NOTE — Telephone Encounter (Signed)
New Message  Patient is calling with a variety of different issues. She states that she has some weight gain but its no true pattern. Its not 2lb in two days or even 5lbs in three. Just on/off weight gain with no pattern.[phone].  She also states sob when she moves around periodically and some palpitations. But nothing that she can pinpoint.

## 2019-07-10 ENCOUNTER — Ambulatory Visit: Payer: Medicare Other | Admitting: Physical Therapy

## 2019-07-10 ENCOUNTER — Encounter: Payer: Self-pay | Admitting: Cardiovascular Disease

## 2019-07-10 ENCOUNTER — Telehealth (INDEPENDENT_AMBULATORY_CARE_PROVIDER_SITE_OTHER): Payer: Medicare Other | Admitting: Cardiovascular Disease

## 2019-07-10 VITALS — BP 159/89 | HR 93 | Ht 62.0 in | Wt 295.0 lb

## 2019-07-10 DIAGNOSIS — I5032 Chronic diastolic (congestive) heart failure: Secondary | ICD-10-CM | POA: Diagnosis not present

## 2019-07-10 DIAGNOSIS — Z961 Presence of intraocular lens: Secondary | ICD-10-CM | POA: Diagnosis not present

## 2019-07-10 DIAGNOSIS — R600 Localized edema: Secondary | ICD-10-CM

## 2019-07-10 DIAGNOSIS — I110 Hypertensive heart disease with heart failure: Secondary | ICD-10-CM | POA: Diagnosis not present

## 2019-07-10 DIAGNOSIS — H04123 Dry eye syndrome of bilateral lacrimal glands: Secondary | ICD-10-CM | POA: Diagnosis not present

## 2019-07-10 DIAGNOSIS — H353132 Nonexudative age-related macular degeneration, bilateral, intermediate dry stage: Secondary | ICD-10-CM | POA: Diagnosis not present

## 2019-07-10 DIAGNOSIS — I10 Essential (primary) hypertension: Secondary | ICD-10-CM

## 2019-07-10 DIAGNOSIS — H11423 Conjunctival edema, bilateral: Secondary | ICD-10-CM | POA: Diagnosis not present

## 2019-07-10 DIAGNOSIS — H11823 Conjunctivochalasis, bilateral: Secondary | ICD-10-CM | POA: Diagnosis not present

## 2019-07-10 DIAGNOSIS — Z6841 Body Mass Index (BMI) 40.0 and over, adult: Secondary | ICD-10-CM

## 2019-07-10 DIAGNOSIS — R0602 Shortness of breath: Secondary | ICD-10-CM | POA: Diagnosis not present

## 2019-07-10 NOTE — Patient Instructions (Signed)
Medication Instructions:  Increase Torsemide to 20 mg to twice a day for 3 days, then resume 20 mg daily   *If you need a refill on your cardiac medications before your next appointment, please call your pharmacy*   Follow-Up: At [facility], you and your health needs are our priority.  As part of our continuing mission to provide you with exceptional heart care, we have created designated Provider Care Teams.  These Care Teams include your primary Cardiologist (physician) and Advanced Practice Providers (APPs -  Physician Assistants and Nurse Practitioners) who all work together to provide you with the care you need, when you need it.  We recommend signing up for the patient portal called "MyChart".  Sign up information is provided on this After Visit Summary.  MyChart is used to connect with patients for Virtual Visits (Telemedicine).  Patients are able to view lab/test results, encounter notes, upcoming appointments, etc.  Non-urgent messages can be sent to your provider as well.   To learn more about what you can do with MyChart, go to [email or web address].    Your next appointment:   Monday March 15th at 1:40  The format for your next appointment:   In Person  Provider:   Eleonore Chiquito, MD

## 2019-07-10 NOTE — Progress Notes (Signed)
Virtual Visit via Video Note   This visit type was conducted due to national recommendations for restrictions regarding the COVID-19 Pandemic (e.g. social distancing) in an effort to limit this patient's exposure and mitigate transmission in our community.  Due to her co-morbid illnesses, this patient is at least at moderate risk for complications without adequate follow up.  This format is felt to be most appropriate for this patient at this time.  All issues noted in this document were discussed and addressed.  A limited physical exam was performed with this format.  Please refer to the patient's chart for her consent to telehealth for [facility].   The patient was identified using 2 identifiers.  Date:  07/10/2019   ID:  [place], DOB [id number], MRN [id number]  Patient Location: Home Provider Location: Home  PCP:  Alroy Dust, Carlean Jews.Marlou Sa, MD  Cardiologist:  Evalina Field, MD   Evaluation Performed:  Follow-Up Visit  Chief Complaint:  SOB  History of Present Illness:    Jessica [name] is a 69 y.o. female with HFpEF, OSA/OHS, morbid obesity, HTN who presents for follow-up of HFpEF. She reports recent weight gain over the past 2-3 weeks. Weights up to 295 from 286 in January. Reports palpitations after heavy activity. She has been walking with walker more and doing yard work. Seems activities are a bit more difficult that has coincided with weight gain. Associated symptoms include increase LE edema. She has continued to work on salt restriction. She denies CP. She does report that her incontinence is bothersome, especially on a diuretic.   Problem List: 1. HFpEF 2. RV Failure 2/2 OSA/OHS 3. Morbid Obesity 4. HTN  The patient does not have symptoms concerning for COVID-19 infection (fever, chills, cough, or new shortness of breath).    Past Medical History:  Diagnosis Date  . Arthritis    lower back, hands, knees  . Bipolar disorder ([place])   . Cellulitis and  abscess of left leg 11/29/2018  . CHF (congestive heart failure) ([place])   . Depression   . Dyspnea    with exertion  . GERD (gastroesophageal reflux disease)    patient unsure about this dx - no meds  . Headache   . History of IBS    watches diet  . Hyperlipidemia   . Hypertension   . Hypothyroidism   . Lung edema, acute, with congestive heart failure ([place])   . Obese   . Plantar fasciitis   . Seasonal allergies   . Sleep apnea 2017   uses CPAP   . Spinal stenosis of lumbar region   . SVD (spontaneous vaginal delivery)    x 2  . Systemic lupus ([place])   . Thyroid disease    Past Surgical History:  Procedure Laterality Date  . CARPAL TUNNEL RELEASE Left 09/16/2017   Procedure: LEFT CARPAL TUNNEL RELEASE;  Surgeon: Daryll Brod, MD;  Location: [place];  Service: Orthopedics;  Laterality: Left;  . carpel tunnel surgery Right   . CATARACT EXTRACTION Bilateral 2007   w/ lens implants  . COLONOSCOPY    . DILATION AND CURETTAGE OF UTERUS    . EYE SURGERY    . FOOT SURGERY Right    hammer toe  . FRACTURE SURGERY     R tibia and fibula  . LEG SURGERY Right   . TONSILLECTOMY    . WISDOM TOOTH EXTRACTION       Current Meds  Medication Sig  . acetaminophen (TYLENOL) 650 MG  CR tablet Take 650 mg by mouth daily.   . Acetylcysteine 600 MG CAPS Take 600 mg by mouth 2 (two) times daily. NAC  . amLODipine (NORVASC) 10 MG tablet Take 1 tablet (10 mg total) by mouth daily.  . Carboxymeth-Glycerin-Polysorb (REFRESH OPTIVE ADVANCED OP) Apply to eye.  . cycloSPORINE (RESTASIS) 0.05 % ophthalmic emulsion Place 1 drop into both eyes 2 (two) times daily.  . DULoxetine (CYMBALTA) 60 MG capsule TAKE 1 CAPSULE BY MOUTH EVERY DAY **DISCONTINUE SERTRALINE**  . gabapentin (NEURONTIN) 300 MG capsule Take 1 capsule (300 mg total) by mouth at bedtime. (Patient taking differently: Take 1,200 mg by mouth daily. TAKE 1 TABLET IN THE MORNING AND 3 TABLETS AT NIGHT.)  . levothyroxine (SYNTHROID) 25 MCG tablet  Take 25 mcg by mouth daily before breakfast.   . [START ON 08/07/2019] lisdexamfetamine (VYVANSE) 60 MG capsule Take 1 capsule (60 mg total) by mouth every morning.  . lithium carbonate 150 MG capsule TAKE 5 CAPSULES (750 MG TOTAL) BY MOUTH AT BEDTIME. (Patient taking differently: Take 600 mg by mouth at bedtime. )  . LORazepam (ATIVAN) 0.5 MG tablet Take 1 tablet (0.5 mg total) by mouth 2 (two) times daily as needed for anxiety.  . metoprolol tartrate (LOPRESSOR) 25 MG tablet Take 0.5 tablets (12.5 mg total) by mouth 2 (two) times daily.  . Multiple Vitamins-Minerals (MULTIVITAMIN WITH MINERALS) tablet Take 1 tablet by mouth daily.  . Multiple Vitamins-Minerals (PRESERVISION AREDS 2) CAPS Take 1 capsule 2 (two) times daily by mouth.  . potassium chloride SA (KLOR-CON) 20 MEQ tablet Take 1 tablet by mouth every other day on days Torsemide is taken  . pravastatin (PRAVACHOL) 20 MG tablet Take 20 mg at bedtime by mouth.   . SYNTHROID 200 MCG tablet TAKE 1 TABLET BY MOUTH EVERY MORNING ON AN EMPTY STOMACH  . Thiamine HCl (VITAMIN B1) 100 MG TABS Take 1 tablet by mouth daily.  Marland Kitchen torsemide (DEMADEX) 20 MG tablet Take 20 mg daily  . triamcinolone cream (KENALOG) 0.5 % APPLY SPARINGLY EXTERNALLY TO THE AFFECTED AREA TWICE A DAY  . vitamin B-12 (CYANOCOBALAMIN) 100 MCG tablet Take 100 mcg by mouth 2 (two) times daily.  . Vitamin D, Ergocalciferol, (DRISDOL) 1.25 MG (50000 UNIT) CAPS capsule Take 1 capsule (50,000 Units total) by mouth every 7 (seven) days.  . [DISCONTINUED] lisdexamfetamine (VYVANSE) 60 MG capsule Take 1 capsule (60 mg total) by mouth every morning.  . [DISCONTINUED] lisdexamfetamine (VYVANSE) 60 MG capsule Take 1 capsule (60 mg total) by mouth every morning.     Allergies:   Ibuprofen, Codeine, Nabumetone, Promethazine, Amoxicillin-pot clavulanate, Erythromycin base, and Promethazine-codeine   Social History   Tobacco Use  . Smoking status: Never Smoker  . Smokeless tobacco: Never  Used  Substance Use Topics  . Alcohol use: Yes    Comment: rarely  . Drug use: Never     Family Hx: The patient's family history includes Bipolar disorder in her father; Breast cancer in her mother; Dementia in her father; Diabetes in her father; Heart disease in her father and mother; Thyroid disease in her mother.  ROS:   Please see the history of present illness.     All other systems reviewed and are negative.   Prior CV studies:   The following studies were reviewed today:  TTE 12/02/2018 1. The left ventricle has normal systolic function with an ejection  fraction of 60-65%. The cavity size was normal. There is mild concentric  left ventricular hypertrophy. Left  ventricular diastolic Doppler  parameters are consistent with impaired  relaxation. Indeterminate filling pressures.  2. The right ventricle has mildly reduced systolic function. The cavity  was mildly enlarged. There is no increase in right ventricular wall  thickness.  3. The aortic valve is tricuspid. Moderate aortic annular calcification  noted.  4. The mitral valve is grossly normal. There is mild mitral annular  calcification present.  5. The tricuspid valve is grossly normal.  6. The aorta is normal in size and structure.   Labs/Other Tests and Data Reviewed:    EKG:  No ECG reviewed.  Recent Labs: 12/06/2018: B Natriuretic Peptide 82.5 12/10/2018: Magnesium 2.5 12/11/2018: Hemoglobin 14.4; Platelets 337 04/16/2019: ALT 17; BUN 27; Creatinine, Ser 0.99; Potassium 4.8; Sodium 137   Recent Lipid Panel Lab Results  Component Value Date/Time   CHOL 173 01/23/2013 11:05 AM   TRIG 271 (H) 01/23/2013 11:05 AM   HDL 51 01/23/2013 11:05 AM   CHOLHDL 3.4 01/23/2013 11:05 AM   LDLCALC 68 01/23/2013 11:05 AM    Wt Readings from Last 3 Encounters:  07/10/19 295 lb (133.8 kg)  05/31/19 286 lb (129.7 kg)  04/16/19 286 lb (129.7 kg)     Objective:    Vital Signs:  BP (!) 159/89   Pulse 93   Ht 5'  2" (1.575 m)   Wt 295 lb (133.8 kg)   SpO2 96%   BMI 53.96 kg/m    VITAL SIGNS:  reviewed  Gen: NAD Pulm: Talking complete sentences by phone  Psych: normal mood affect   ASSESSMENT & PLAN:    1. SOB (shortness of breath) on exertion 2. Bilateral leg edema 3. Chronic diastolic heart failure (HCC) -symptoms of 10 lb weight gain with increased SOB/LE edema. Suspect has volume on board. I will increase her torsemide to 20 mg BID for 3 days and then resume 20 mg daily. I will get her in to see me on Monday at 1:40 PM for urgent appointment. We will likely recheck Cr at that time.   4. Essential hypertension -no change to current medications  5. Obesity, morbid, BMI 50 or higher ([place]) -continue with diet/exercise    COVID-19 Education: The signs and symptoms of COVID-19 were discussed with the patient and how to seek care for testing (follow up with PCP or arrange E-visit).  The importance of social distancing was discussed today.  Time:   Today, I have spent 30 minutes with the patient with telehealth technology discussing the above problems.     Medication Adjustments/Labs and Tests Ordered: Current medicines are reviewed at length with the patient today.  Concerns regarding medicines are outlined above.   Tests Ordered: No orders of the defined types were placed in this encounter.   Medication Changes: Increase torsemide 20 mg BID x 3 days and then resume 20 mg daily   Follow Up:  In Person 07/16/2019 @ 1:40 PM  Signed, Evalina Field, MD  07/10/2019 10:06 AM    [place]

## 2019-07-13 NOTE — Progress Notes (Deleted)
Office Visit Note  Patient: Jessica [name]             Date of Birth: 02/04/1951           MRN: [id number]             PCP: Aurea Graff.Marlou Sa, MD Referring: Alroy Dust, Carlean Jews.Marlou Sa, MD Visit Date: 07/19/2019 Occupation: @GUAROCC @  Subjective:  No chief complaint on file.   History of Present Illness: Jessica [name] is a 69 y.o. female ***   Activities of Daily Living:  Patient reports morning stiffness for *** {minute/hour:19697}.   Patient {ACTIONS;DENIES/REPORTS:21021675::"Denies"} nocturnal pain.  Difficulty dressing/grooming: {ACTIONS;DENIES/REPORTS:21021675::"Denies"} Difficulty climbing stairs: {ACTIONS;DENIES/REPORTS:21021675::"Denies"} Difficulty getting out of chair: {ACTIONS;DENIES/REPORTS:21021675::"Denies"} Difficulty using hands for taps, buttons, cutlery, and/or writing: {ACTIONS;DENIES/REPORTS:21021675::"Denies"}  No Rheumatology ROS completed.   PMFS History:  Patient Active Problem List   Diagnosis Date Noted  . Chronic diastolic heart failure ([place])   . Systemic lupus ([place])   . Morbid obesity due to excess calories ([place])   . Bipolar disorder ([place])   . Cellulitis 11/29/2018  . OSA (obstructive sleep apnea) 11/27/2018  . Chronic hypoxemic respiratory failure ([place]) 11/27/2018  . Primary osteoarthritis of both hands 04/19/2017  . Primary osteoarthritis of both knees 04/19/2017  . Primary osteoarthritis of both feet 04/19/2017  . DDD (degenerative disc disease), lumbar 04/19/2017  . History of bipolar disorder 04/19/2017  . Hypothyroidism 06/04/2014  . HTN (hypertension) 01/23/2013  . HLD (hyperlipidemia) 01/23/2013    Past Medical History:  Diagnosis Date  . Arthritis    lower back, hands, knees  . Bipolar disorder ([place])   . Cellulitis and abscess of left leg 11/29/2018  . CHF (congestive heart failure) ([place])   . Depression   . Dyspnea    with exertion  . GERD (gastroesophageal reflux disease)    patient unsure about this dx - no meds  .  Headache   . History of IBS    watches diet  . Hyperlipidemia   . Hypertension   . Hypothyroidism   . Lung edema, acute, with congestive heart failure ([place])   . Obese   . Plantar fasciitis   . Seasonal allergies   . Sleep apnea 2017   uses CPAP   . Spinal stenosis of lumbar region   . SVD (spontaneous vaginal delivery)    x 2  . Systemic lupus ([place])   . Thyroid disease     Family History  Problem Relation Age of Onset  . Thyroid disease Mother   . Breast cancer Mother   . Heart disease Mother   . Bipolar disorder Father   . Diabetes Father   . Heart disease Father   . Dementia Father    Past Surgical History:  Procedure Laterality Date  . CARPAL TUNNEL RELEASE Left 09/16/2017   Procedure: LEFT CARPAL TUNNEL RELEASE;  Surgeon: Daryll Brod, MD;  Location: [place];  Service: Orthopedics;  Laterality: Left;  . carpel tunnel surgery Right   . CATARACT EXTRACTION Bilateral 2007   w/ lens implants  . COLONOSCOPY    . DILATION AND CURETTAGE OF UTERUS    . EYE SURGERY    . FOOT SURGERY Right    hammer toe  . FRACTURE SURGERY     R tibia and fibula  . LEG SURGERY Right   . TONSILLECTOMY    . WISDOM TOOTH EXTRACTION     Social History   Social History Narrative   She lives in a single level home with her  husband.  She has two grown children.   She taught college accounting courses, retired in 2005.    Highest level of education:  Doctorate in Administrator, sports History  Administered Date(s) Administered  . Influenza, High Dose Seasonal PF 02/12/2019  . PFIZER SARS-COV-2 Vaccination 06/24/2019     Objective: Vital Signs: There were no vitals taken for this visit.   Physical Exam   Musculoskeletal Exam: ***  CDAI Exam: CDAI Score: -- Patient Global: --; Provider Global: -- Swollen: --; Tender: -- Joint Exam 07/19/2019   No joint exam has been documented for this visit   There is currently no information documented on the homunculus. Go to the  Rheumatology activity and complete the homunculus joint exam.  Investigation: No additional findings.  Imaging: NCV with EMG(electromyography)  Result Date: 06/20/2019 Alda Berthold, DO     06/20/2019  4:09 PM [facility] [address], [address]  [place], [place] [address] Tel: [phone] Fax:  [phone] Test Date:  06/20/2019 Patient: Jessica [name] DOB: 10-04-1950 Physician: Narda Amber, DO Sex: Female Height: 5\' 2"  Ref Phys: Narda Amber, DO ID#: [id number] Temp: 32.0C Technician:  Patient Complaints: This is a 69 year old female with history of bilateral CTS release referred for evaluation of bilateral hand numbness and tingling. NCV & EMG Findings: Extensive electrodiagnostic testing of the right upper extremity and additional studies of the left shows: 1. Bilateral median sensory responses show prolonged latency (R6.0, L4.5 ms) and reduced amplitude (R7.1, L5.4 V).  Left median motor response was previously absent on study dated 06/16/2017.  Bilateral ulnar and radial sensory responses are within normal limits. 2. Bilateral median motor responses show prolonged latency (R7.0, L4.9 ms) and reduced amplitude (R2.5, 4.7 mV).  Bilateral ulnar motor responses are within normal limits. 3. Chronic motor axonal loss changes are seen affecting bilateral abductor pollicis brevis muscles, without accompanied active denervation.  Impression: Bilateral median neuropathy at or distal to the wrist (severe), consistent with a clinical diagnosis of carpal tunnel syndrome.  As compared to prior study on 06/16/2017 of the left upper extremity, there has been mild improvement, however findings remain severe. There is no evidence of a sensorimotor polyneuropathy affecting the upper extremities. ___________________________ Narda Amber, DO Nerve Conduction Studies Anti Sensory Summary Table  Site NR Peak (ms) Norm Peak (ms) P-T Amp (V) Norm P-T Amp Left Median Anti Sensory (2nd Digit)  32C Wrist    4.5  <3.8 5.4 >10 Right Median Anti Sensory (2nd Digit)  32C Wrist    6.0 <3.8 7.1 >10 Left Radial Anti Sensory (Base 1st Digit)  32C Wrist    2.3 <2.8 16.5 >10 Right Radial Anti Sensory (Base 1st Digit)  32C Wrist    2.2 <2.8 14.1 >10 Left Ulnar Anti Sensory (5th Digit)  32C Wrist    2.9 <3.2 11.0 >5 Right Ulnar Anti Sensory (5th Digit)  32C Wrist    3.2 <3.2 11.8 >5 Motor Summary Table  Site NR Onset (ms) Norm Onset (ms) O-P Amp (mV) Norm O-P Amp Site1 Site2 Delta-0 (ms) Dist (cm) Vel (m/s) Norm Vel (m/s) Left Median Motor (Abd Poll Brev)  32C Wrist    4.9 <4.0 4.7 >5 Elbow Wrist 5.2 30.0 58 >50 Elbow    10.1  3.9        Right Median Motor (Abd Poll Brev)  32C Wrist    7.0 <4.0 2.5 >5 Elbow Wrist 5.9 30.0 51 >50 Elbow    12.9  2.3  Left Ulnar Motor (Abd Dig Minimi)  32C Wrist    2.7 <3.1 8.5 >7 B Elbow Wrist 3.9 22.0 56 >50 B Elbow    6.6  7.7  A Elbow B Elbow 1.8 10.0 56 >50 A Elbow    8.4  7.5        Right Ulnar Motor (Abd Dig Minimi)  32C Wrist    3.0 <3.1 8.2 >7 B Elbow Wrist 4.2 22.0 52 >50 B Elbow    7.2  7.8  A Elbow B Elbow 1.8 10.0 56 >50 A Elbow    9.0  7.4        EMG  Side Muscle Ins Act Fibs Psw Fasc Number Recrt Dur Dur. Amp Amp. Poly Poly. Comment Right 1stDorInt Nml Nml Nml Nml Nml Nml Nml Nml Nml Nml Nml Nml N/A Right Abd Poll Brev Nml Nml Nml Nml 3- Rapid Many 1+ Many 1+ Many 1+ N/A Right PronatorTeres Nml Nml Nml Nml Nml Nml Nml Nml Nml Nml Nml Nml N/A Right Biceps Nml Nml Nml Nml Nml Nml Nml Nml Nml Nml Nml Nml N/A Right Triceps Nml Nml Nml Nml Nml Nml Nml Nml Nml Nml Nml Nml N/A Right Deltoid Nml Nml Nml Nml Nml Nml Nml Nml Nml Nml Nml Nml N/A Left 1stDorInt Nml Nml Nml Nml Nml Nml Nml Nml Nml Nml Nml Nml N/A Left PronatorTeres Nml Nml Nml Nml Nml Nml Nml Nml Nml Nml Nml Nml N/A Left Triceps Nml Nml Nml Nml Nml Nml Nml Nml Nml Nml Nml Nml N/A Left Biceps Nml Nml Nml Nml Nml Nml Nml Nml Nml Nml Nml Nml N/A Left Deltoid Nml Nml Nml Nml Nml Nml Nml Nml Nml Nml Nml Nml N/A Left Abd Poll  Brev Nml Nml Nml Nml 3- Rapid Many 1+ Most 1+ Most 1+ N/A Waveforms:             MM 3D SCREEN BREAST BILATERAL  Result Date: 07/03/2019 CLINICAL DATA:  Screening. EXAM: DIGITAL SCREENING BILATERAL MAMMOGRAM WITH TOMO AND CAD COMPARISON:  Previous exam(s). ACR Breast Density Category b: There are scattered areas of fibroglandular density. FINDINGS: There are no findings suspicious for malignancy. Images were processed with CAD. IMPRESSION: No mammographic evidence of malignancy. A result letter of this screening mammogram will be mailed directly to the patient. RECOMMENDATION: Screening mammogram in one year. (Code:SM-B-01Y) BI-RADS CATEGORY  1: Negative. Electronically Signed   By: Margarette Canada M.D.   On: 07/03/2019 14:41    Recent Labs: Lab Results  Component Value Date   WBC 16.7 (H) 12/11/2018   HGB 14.4 12/11/2018   PLT 337 12/11/2018   NA 137 04/16/2019   K 4.8 04/16/2019   CL 102 04/16/2019   CO2 23 04/16/2019   GLUCOSE 115 (H) 04/16/2019   BUN 27 04/16/2019   CREATININE 0.99 04/16/2019   BILITOT 0.2 04/16/2019   ALKPHOS 146 (H) 04/16/2019   AST 22 04/16/2019   ALT 17 04/16/2019   PROT 6.6 04/16/2019   ALBUMIN 4.0 04/16/2019   CALCIUM 10.9 (H) 04/16/2019   GFRAA 67 04/16/2019    Speciality Comments: No specialty comments available.  Procedures:  No procedures performed Allergies: Ibuprofen, Codeine, Nabumetone, Promethazine, Amoxicillin-pot clavulanate, Erythromycin base, and Promethazine-codeine   Assessment / Plan:     Visit Diagnoses: No diagnosis found.  Orders: No orders of the defined types were placed in this encounter.  No orders of the defined types were placed in this encounter.   Face-to-face time spent with patient  was *** minutes. Greater than 50% of time was spent in counseling and coordination of care.  Follow-Up Instructions: No follow-ups on file.   Earnestine Mealing, CMA  Note - This record has been created using Editor, commissioning.  Chart  creation errors have been sought, but may not always  have been located. Such creation errors do not reflect on  the standard of medical care.

## 2019-07-15 NOTE — Progress Notes (Signed)
Cardiology Office Note:   Date:  07/16/2019  NAME:  Jessica [name]    MRN: [id number] DOB:  Mar 08, 1951   PCP:  Alroy Dust, L.Marlou Sa, MD  Cardiologist:  Evalina Field, MD   Referring MD: Aurea Graff.Marlou Sa, MD   Chief Complaint  Patient presents with  . Follow-up  . Edema  . Shortness of Breath  . Headache   History of Present Illness:   Jessica [name] is a 69 y.o. female with a hx of morbid obesity, HTN, HFpEF who presents for follow-up. Was evaluated 3/9 with 10 lb weight gain and LE edema. We increased her lasix to BID dosing for 3 days. Follow-up today. She reports increased lower extremity edema and shortness of breath.  She reports she is able to walk around the house but does get quite winded.  We did increase her torsemide to twice a day dosing last week.  Weights are a bit improved.  She reports that this morning she weighed 292.  She was 286 pounds when I saw her a few months ago.  She has no evidence of gross volume overload.  She has chronic venous insufficiency changes noted in the lower extremities as well as lymphedema.  Blood pressure is well controlled today.  Oxygen saturation was 94% on room air.  She denies chest pain, shortness of breath, palpitations in office today.  She will turn 69 on Sunday.  They do have plans to go to [facility].  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, and other universities.  Problem List: 1. HFpEF 2. RV Failure 2/2 OSA/OHS 3. Morbid Obesity (BMI 53) 4. HTN   Past Medical History: Past Medical History:  Diagnosis Date  . Arthritis    lower back, hands, knees  . Bipolar disorder ([place])   . Cellulitis and abscess of left leg 11/29/2018  . CHF (congestive heart failure) ([place])   . Depression   . Dyspnea    with exertion  . GERD (gastroesophageal reflux disease)    patient unsure about this dx - no meds  . Headache   . History of IBS    watches diet  . Hyperlipidemia     . Hypertension   . Hypothyroidism   . Lung edema, acute, with congestive heart failure ([place])   . Obese   . Plantar fasciitis   . Seasonal allergies   . Sleep apnea 2017   uses CPAP   . Spinal stenosis of lumbar region   . SVD (spontaneous vaginal delivery)    x 2  . Systemic lupus ([place])   . Thyroid disease     Past Surgical History: Past Surgical History:  Procedure Laterality Date  . CARPAL TUNNEL RELEASE Left 09/16/2017   Procedure: LEFT CARPAL TUNNEL RELEASE;  Surgeon: Daryll Brod, MD;  Location: [facility];  Service: Orthopedics;  Laterality: Left;  . carpel tunnel surgery Right   . CATARACT EXTRACTION Bilateral 2007   w/ lens implants  . COLONOSCOPY    . DILATION AND CURETTAGE OF UTERUS    . EYE SURGERY    . FOOT SURGERY Right    hammer toe  . FRACTURE SURGERY     R tibia and fibula  . LEG SURGERY Right   . TONSILLECTOMY    . WISDOM TOOTH EXTRACTION      Current Medications: Current Meds  Medication Sig  . acetaminophen (TYLENOL) 650 MG CR tablet Take 650 mg by mouth  daily.   . Acetylcysteine 600 MG CAPS Take 600 mg by mouth 2 (two) times daily. NAC  . amLODipine (NORVASC) 10 MG tablet Take 1 tablet (10 mg total) by mouth daily.  . Carboxymeth-Glycerin-Polysorb (REFRESH OPTIVE ADVANCED OP) Apply to eye.  . cycloSPORINE (RESTASIS) 0.05 % ophthalmic emulsion Place 1 drop into both eyes 2 (two) times daily.  . DULoxetine (CYMBALTA) 60 MG capsule TAKE 1 CAPSULE BY MOUTH EVERY DAY **DISCONTINUE SERTRALINE**  . gabapentin (NEURONTIN) 300 MG capsule Take 1 capsule (300 mg total) by mouth at bedtime. (Patient taking differently: Take 1,200 mg by mouth daily. TAKE 1 TABLET IN THE MORNING AND 3 TABLETS AT NIGHT.)  . levothyroxine (SYNTHROID) 25 MCG tablet Take 25 mcg by mouth daily before breakfast.   . [START ON 08/07/2019] lisdexamfetamine (VYVANSE) 60 MG capsule Take 1 capsule (60 mg total) by mouth every morning.  . lithium carbonate 150 MG capsule TAKE 5 CAPSULES (750 MG  TOTAL) BY MOUTH AT BEDTIME. (Patient taking differently: Take 600 mg by mouth at bedtime. )  . LORazepam (ATIVAN) 0.5 MG tablet Take 1 tablet (0.5 mg total) by mouth 2 (two) times daily as needed for anxiety.  . metoprolol tartrate (LOPRESSOR) 25 MG tablet Take 0.5 tablets (12.5 mg total) by mouth 2 (two) times daily.  . Multiple Vitamins-Minerals (MULTIVITAMIN WITH MINERALS) tablet Take 1 tablet by mouth daily.  . Multiple Vitamins-Minerals (PRESERVISION AREDS 2) CAPS Take 1 capsule 2 (two) times daily by mouth.  . potassium chloride SA (KLOR-CON) 20 MEQ tablet Take 1 tablet by mouth every other day on days Torsemide is taken  . pravastatin (PRAVACHOL) 20 MG tablet Take 20 mg at bedtime by mouth.   . SYNTHROID 200 MCG tablet TAKE 1 TABLET BY MOUTH EVERY MORNING ON AN EMPTY STOMACH  . Thiamine HCl (VITAMIN B1) 100 MG TABS Take 1 tablet by mouth daily.  Marland Kitchen torsemide (DEMADEX) 20 MG tablet Take 20 mg daily  . triamcinolone cream (KENALOG) 0.5 % APPLY SPARINGLY EXTERNALLY TO THE AFFECTED AREA TWICE A DAY  . vitamin B-12 (CYANOCOBALAMIN) 100 MCG tablet Take 100 mcg by mouth 2 (two) times daily.  . Vitamin D, Ergocalciferol, (DRISDOL) 1.25 MG (50000 UNIT) CAPS capsule Take 1 capsule (50,000 Units total) by mouth every 7 (seven) days.     Allergies:    Ibuprofen, Codeine, Nabumetone, Promethazine, Amoxicillin-pot clavulanate, Erythromycin base, and Promethazine-codeine   Social History: Social History   Socioeconomic History  . Marital status: Married    Spouse name: Not on file  . Number of children: Not on file  . Years of education: Not on file  . Highest education level: Not on file  Occupational History  . Not on file  Tobacco Use  . Smoking status: Never Smoker  . Smokeless tobacco: Never Used  Substance and Sexual Activity  . Alcohol use: Yes    Comment: rarely  . Drug use: Never  . Sexual activity: Not on file  Other Topics Concern  . Not on file  Social History Narrative    She lives in a single level home with her husband.  She has two grown children.   She taught college accounting courses, retired in 2005.    Highest level of education:  Designer, jewellery in Press photographer   Social Determinants of Health   Financial Resource Strain:   . Difficulty of Paying Living Expenses:   Food Insecurity:   . Worried About Charity fundraiser in the Last Year:   . Ran  Out of Food in the Last Year:   Transportation Needs:   . Lack of Transportation (Medical):   Marland Kitchen Lack of Transportation (Non-Medical):   Physical Activity:   . Days of Exercise per Week:   . Minutes of Exercise per Session:   Stress:   . Feeling of Stress :   Social Connections:   . Frequency of Communication with Friends and Family:   . Frequency of Social Gatherings with Friends and Family:   . Attends Religious Services:   . Active Member of Clubs or Organizations:   . Attends Archivist Meetings:   Marland Kitchen Marital Status:      Family History: The patient's family history includes Bipolar disorder in her father; Breast cancer in her mother; Dementia in her father; Diabetes in her father; Heart disease in her father and mother; Thyroid disease in her mother.  ROS:   All other ROS reviewed and negative. Pertinent positives noted in the HPI.     EKGs/Labs/Other Studies Reviewed:   The following studies were personally reviewed by me today:  Recent Labs: 12/06/2018: B Natriuretic Peptide 82.5 12/10/2018: Magnesium 2.5 12/11/2018: Hemoglobin 14.4; Platelets 337 04/16/2019: ALT 17; BUN 27; Creatinine, Ser 0.99; Potassium 4.8; Sodium 137   Recent Lipid Panel    Component Value Date/Time   CHOL 173 01/23/2013 1105   TRIG 271 (H) 01/23/2013 1105   HDL 51 01/23/2013 1105   CHOLHDL 3.4 01/23/2013 1105   VLDL 54 (H) 01/23/2013 1105   LDLCALC 68 01/23/2013 1105    Physical Exam:   VS:  BP 128/72 (BP Location: Left Arm, Patient Position: Sitting, Cuff Size: Large)   Pulse 76   Temp (!) 96.8 F (36  C)   Ht 5\' 2"  (1.575 m)   Wt 295 lb (133.8 kg)   SpO2 95%   BMI 53.96 kg/m    Wt Readings from Last 3 Encounters:  07/16/19 295 lb (133.8 kg)  07/10/19 295 lb (133.8 kg)  05/31/19 286 lb (129.7 kg)    General: Morbidly obese Heart: Atraumatic, normal size  Eyes: PEERLA, EOMI  Neck: Supple, no JVD Endocrine: No thryomegaly Cardiac: Normal S1, S2; RRR; no murmurs, rubs, or gallops Lungs: Diminished breath sounds bilaterally Abd: Soft, nontender, no hepatomegaly  Ext: 1+ edema, chronic venous insufficiency changes noted, lymphedema Musculoskeletal: No deformities, BUE and BLE strength normal and equal Skin: Warm and dry, no rashes   Neuro: Alert and oriented to person, place, time, and situation, CNII-XII grossly intact, no focal deficits  Psych: Normal mood and affect   ASSESSMENT:   Jessica [name] is a 69 y.o. female who presents for the following: 1. Chronic diastolic heart failure ([place])   2. Essential hypertension   3. Obesity, morbid, BMI 50 or higher ([place])     PLAN:   1. Chronic diastolic heart failure ([place]) -She is only about 6-7 pounds up from her prior weight.  We will plan to check a BMP as well as BNP today.  She does not appear to have volume up to me.  We will go ahead with another 3 days of torsemide twice daily.  Overall I think she looks euvolemic.  Her volume status is quite difficult to gauge.  I think she is a bit worried about going back to the hospital and wants to stay on top of this.  I do agree with this.  I will follow with her by phone the results of her lab work.  We will see how  she does over the next few days with increased torsemide dosing and then back to daily dosing.  2. Essential hypertension -Well-controlled  3. Obesity, morbid, BMI 50 or higher ([place]) -Counseled importance of weight loss and exercise.  She will have her CPAP optimized by pulmonary in the next few weeks   Disposition: Return in about 2 months (around  09/15/2019).  Medication Adjustments/Labs and Tests Ordered: Current medicines are reviewed at length with the patient today.  Concerns regarding medicines are outlined above.  Orders Placed This Encounter  Procedures  . Brain natriuretic peptide  . Basic metabolic panel   No orders of the defined types were placed in this encounter.   Patient Instructions  Medication Instructions:  Increase Torsemide to twice daily for 3 days, then back to normal daily dose *If you need a refill on your cardiac medications before your next appointment, please call your pharmacy*   Lab Work: BMET, BNP today   If you have labs (blood work) drawn today and your tests are completely normal, you will receive your results only by: Marland Kitchen MyChart Message (if you have MyChart) OR . A paper copy in the mail If you have any lab test that is abnormal or we need to change your treatment, we will call you to review the results.   Follow-Up: At [facility], you and your health needs are our priority.  As part of our continuing mission to provide you with exceptional heart care, we have created designated Provider Care Teams.  These Care Teams include your primary Cardiologist (physician) and Advanced Practice Providers (APPs -  Physician Assistants and Nurse Practitioners) who all work together to provide you with the care you need, when you need it.  We recommend signing up for the patient portal called "MyChart".  Sign up information is provided on this After Visit Summary.  MyChart is used to connect with patients for Virtual Visits (Telemedicine).  Patients are able to view lab/test results, encounter notes, upcoming appointments, etc.  Non-urgent messages can be sent to your provider as well.   To learn more about what you can do with MyChart, go to [email or web address].    Your next appointment:   2 month(s)  The format for your next appointment:   In Person  Provider:   Eleonore Chiquito, MD          Time Spent with Patient: I have spent a total of 35 minutes with patient reviewing hospital notes, telemetry, EKGs, labs and examining the patient as well as establishing an assessment and plan that was discussed with the patient.  > 50% of time was spent in direct patient care.  Signed, Addison Naegeli. Audie Box, [place]  [address], [place], [place] [address] [phone]  07/16/2019 2:56 PM

## 2019-07-16 ENCOUNTER — Other Ambulatory Visit: Payer: Self-pay

## 2019-07-16 ENCOUNTER — Encounter: Payer: Self-pay | Admitting: Cardiovascular Disease

## 2019-07-16 ENCOUNTER — Ambulatory Visit (INDEPENDENT_AMBULATORY_CARE_PROVIDER_SITE_OTHER): Payer: Medicare Other | Admitting: Cardiovascular Disease

## 2019-07-16 ENCOUNTER — Telehealth: Payer: Self-pay | Admitting: Cardiovascular Disease

## 2019-07-16 ENCOUNTER — Ambulatory Visit: Payer: Medicare Other | Attending: Internal Medicine

## 2019-07-16 VITALS — BP 128/72 | HR 76 | Temp 96.8°F | Ht 62.0 in | Wt 295.0 lb

## 2019-07-16 DIAGNOSIS — I10 Essential (primary) hypertension: Secondary | ICD-10-CM | POA: Diagnosis not present

## 2019-07-16 DIAGNOSIS — I5032 Chronic diastolic (congestive) heart failure: Secondary | ICD-10-CM

## 2019-07-16 DIAGNOSIS — Z23 Encounter for immunization: Secondary | ICD-10-CM

## 2019-07-16 NOTE — Telephone Encounter (Signed)
Left message for patient

## 2019-07-16 NOTE — Progress Notes (Signed)
   Covid-19 Vaccination Clinic  Name:  Maryke Ko    MRN: [id number] DOB: Oct 21, 1950  07/16/2019  Ms. Tegtmeyer was observed post Covid-19 immunization for 15 minutes without incident. She was provided with Vaccine Information Sheet and instruction to access the V-Safe system.   Ms. Tams was instructed to call 911 with any severe reactions post vaccine: Marland Kitchen Difficulty breathing  . Swelling of face and throat  . A fast heartbeat  . A bad rash all over body  . Dizziness and weakness   Immunizations Administered    Name Date Dose VIS Date Route   Pfizer COVID-19 Vaccine 07/16/2019  4:45 PM 0.3 mL 04/13/2019 Intramuscular   Manufacturer: [place]   Lot: [id number]   [place]: [id number]

## 2019-07-16 NOTE — Patient Instructions (Signed)
Medication Instructions:  Increase Torsemide to twice daily for 3 days, then back to normal daily dose *If you need a refill on your cardiac medications before your next appointment, please call your pharmacy*   Lab Work: BMET, BNP today   If you have labs (blood work) drawn today and your tests are completely normal, you will receive your results only by: Marland Kitchen MyChart Message (if you have MyChart) OR . A paper copy in the mail If you have any lab test that is abnormal or we need to change your treatment, we will call you to review the results.   Follow-Up: At [facility], you and your health needs are our priority.  As part of our continuing mission to provide you with exceptional heart care, we have created designated Provider Care Teams.  These Care Teams include your primary Cardiologist (physician) and Advanced Practice Providers (APPs -  Physician Assistants and Nurse Practitioners) who all work together to provide you with the care you need, when you need it.  We recommend signing up for the patient portal called "MyChart".  Sign up information is provided on this After Visit Summary.  MyChart is used to connect with patients for Virtual Visits (Telemedicine).  Patients are able to view lab/test results, encounter notes, upcoming appointments, etc.  Non-urgent messages can be sent to your provider as well.   To learn more about what you can do with MyChart, go to [email or web address].    Your next appointment:   2 month(s)  The format for your next appointment:   In Person  Provider:   Eleonore Chiquito, MD

## 2019-07-16 NOTE — Telephone Encounter (Signed)
Patient states she is requesting for her husband, Jessica [name], to accompany her during her appointment scheduled for today, 07/16/19 at 1:40 PM with Dr. Audie Box due to needing assistance with her mobility scooter as well as the patient having issues with memory. Please advise.

## 2019-07-17 ENCOUNTER — Ambulatory Visit: Payer: Medicare Other | Admitting: Physical Therapy

## 2019-07-17 DIAGNOSIS — R262 Difficulty in walking, not elsewhere classified: Secondary | ICD-10-CM | POA: Diagnosis not present

## 2019-07-17 DIAGNOSIS — M545 Low back pain, unspecified: Secondary | ICD-10-CM

## 2019-07-17 DIAGNOSIS — G8929 Other chronic pain: Secondary | ICD-10-CM | POA: Diagnosis not present

## 2019-07-17 DIAGNOSIS — M62830 Muscle spasm of back: Secondary | ICD-10-CM | POA: Diagnosis not present

## 2019-07-17 DIAGNOSIS — M5416 Radiculopathy, lumbar region: Secondary | ICD-10-CM | POA: Diagnosis not present

## 2019-07-17 DIAGNOSIS — M6281 Muscle weakness (generalized): Secondary | ICD-10-CM

## 2019-07-17 LAB — BASIC METABOLIC PANEL
BUN/Creatinine Ratio: 29 — ABNORMAL HIGH (ref 12–28)
BUN: 26 mg/dL (ref 8–27)
CO2: 23 mmol/L (ref 20–29)
Calcium: 11 mg/dL — ABNORMAL HIGH (ref 8.7–10.3)
Chloride: 103 mmol/L (ref 96–106)
Creatinine, Ser: 0.9 mg/dL (ref 0.57–1.00)
GFR calc Af Amer: 75 mL/min/{1.73_m2} (ref >59)
GFR calc non Af Amer: 65 mL/min/{1.73_m2} (ref >59)
Glucose: 82 mg/dL (ref 65–99)
Potassium: 5 mmol/L (ref 3.5–5.2)
Sodium: 139 mmol/L (ref 134–144)

## 2019-07-17 LAB — BRAIN NATRIURETIC PEPTIDE: BNP: 13.1 pg/mL (ref 0.0–100.0)

## 2019-07-18 ENCOUNTER — Ambulatory Visit: Payer: Medicare Other

## 2019-07-18 NOTE — Therapy (Signed)
[facility] [place], [place], [address] Phone: [phone]   Fax:  [phone]  Physical Therapy Treatment  Patient Details  Name: Jessica [name] MRN: [id number] Date of Birth: 09/21/1950 Referring Provider (PT): Donnie Coffin MD    Encounter Date: 07/17/2019  PT End of Session - 07/18/19 1222    Visit Number  4    Number of Visits  10    Date for PT Re-Evaluation  08/29/19    Authorization Type  medicare progress note after 10 visits    PT Start Time  [id number]    PT Stop Time  1415    PT Time Calculation (min)  30 min    Activity Tolerance  Patient limited by fatigue    Behavior During Therapy  [facility] for tasks assessed/performed       Past Medical History:  Diagnosis Date  . Arthritis    lower back, hands, knees  . Bipolar disorder ([place])   . Cellulitis and abscess of left leg 11/29/2018  . CHF (congestive heart failure) ([place])   . Depression   . Dyspnea    with exertion  . GERD (gastroesophageal reflux disease)    patient unsure about this dx - no meds  . Headache   . History of IBS    watches diet  . Hyperlipidemia   . Hypertension   . Hypothyroidism   . Lung edema, acute, with congestive heart failure ([place])   . Obese   . Plantar fasciitis   . Seasonal allergies   . Sleep apnea 2017   uses CPAP   . Spinal stenosis of lumbar region   . SVD (spontaneous vaginal delivery)    x 2  . Systemic lupus ([place])   . Thyroid disease     Past Surgical History:  Procedure Laterality Date  . CARPAL TUNNEL RELEASE Left 09/16/2017   Procedure: LEFT CARPAL TUNNEL RELEASE;  Surgeon: Daryll Brod, MD;  Location: [place];  Service: Orthopedics;  Laterality: Left;  . carpel tunnel surgery Right   . CATARACT EXTRACTION Bilateral 2007   w/ lens implants  . COLONOSCOPY    . DILATION AND CURETTAGE OF UTERUS    . EYE SURGERY    . FOOT SURGERY Right    hammer toe  . FRACTURE SURGERY     R tibia and fibula  . LEG  SURGERY Right   . TONSILLECTOMY    . WISDOM TOOTH EXTRACTION      There were no vitals filed for this visit.  Subjective Assessment - 07/18/19 1220    Subjective  Patient reports her back has been a little better maybe. She was able to walk outside the other day.    Limitations  Sitting;Lifting;Standing;Walking;House hold activities    How long can you sit comfortably?  any prolonged poistioning causes pain    How long can you stand comfortably?  3-4 minutes before she has too much discomfort    How long can you walk comfortably?  sugnificant dyspnea coming from the lobby to a treatment room    Patient Stated Goals  to be able to walk further more comfortably    Currently in Pain?  Yes    Pain Score  8     Pain Location  Back    Pain Orientation  Right;Left    Pain Descriptors / Indicators  Aching    Pain Type  Chronic pain    Pain Onset  More than a month ago  Pain Frequency  Intermittent    Aggravating Factors   standing and walking    Pain Relieving Factors  changing positions         [facility] PT Assessment - 07/18/19 0001      Observation/Other Assessments   Observations  160' with shortness of breath       Strength   Right Hip Flexion  4/5    Right Hip ABduction  4/5    Right Hip ADduction  4/5    Left Hip Flexion  4/5    Left Hip ABduction  4+/5    Left Hip ADduction  4+/5      Transfers   Comments  per visual inspection less UE support to stand       Ambulation/Gait   Gait Comments  continues to walk with flexed trunk on walker. See self care section                    [facility] Adult PT Treatment/Exercise - 07/18/19 0001      Self-Care   Other Self-Care Comments   patient uses a walking stick outside and feels like she can stand straighter. Jon Billings is a standing rollator option. Therapy reviewed this with the patient. She feels like her stick is sliperry on concrete or hardwood floors. She was shown walking stikcs. She is aftriad they may collapse but  this is unlikley if set up properly.       Lumbar Exercises: Seated   Other Seated Lumbar Exercises  scap retraction x20; shoulder extension x20;     Other Seated Lumbar Exercises  bilateral er yellow 2x10; horizontal abduction yellow 2x10       Manual Therapy   Manual therapy comments  upper trap / peri-scapular IASTYM to iumprove posutre. Patient is having pain and her neck that is causing her to have to slouch.              PT Education - 07/18/19 1221    Education Details  HEP and symptom mangement    Person(s) Educated  Patient    Methods  Explanation;Demonstration;Verbal cues;Tactile cues    Comprehension  Verbalized understanding;Returned demonstration;Verbal cues required;Tactile cues required       PT Short Term Goals - 07/18/19 1324      PT SHORT TERM GOAL #1   Title  Patient will be indepdnent with very basic HEP    Baseline  able to return demonstrate sit<->sidelying without over use of UE in prep for carpal tunnel surgery. Can sit-stand without UEs     Time  3    Period  Weeks    Status  On-going    Target Date  07/25/19      PT SHORT TERM GOAL #2   Title  Patient will ambualte 200' without a seated rest break    Baseline  160'    Time  3    Period  Weeks    Status  Revised    Target Date  08/29/19        PT Long Term Goals - 05/22/19 1307      PT LONG TERM GOAL #1   Title  Patient will transfer sit to stand witout dyspnea and pain    Time  6    Period  Weeks    Status  New    Target Date  07/03/19      PT LONG TERM GOAL #2   Title  Patient will ambualte 300' without a rest  break on 2 min walk test in order to improve community ambualtion    Time  6    Period  Weeks    Status  New    Target Date  07/03/19      PT LONG TERM GOAL #3   Title  Patient will stand a t home for 20 min without self reported increased lower back pain    Time  6    Period  Weeks    Status  New    Target Date  07/03/19            Plan - 07/18/19 1320     Clinical Impression Statement  Therapy reviewwed options to improve posture. She is having neck pain which is effecting her posutre so therapy worked on light manual therapy to allow her to come to a more upright position. She also perfromed light postural exercises. She was limited by being late. therapy will advanace patient as tolerated. The patient is making some functionla progress. She is walking further and able to satand a little longer at home. She would benefit from further skilled therapy 1w6.    Personal Factors and Comorbidities  Comorbidity 1;Comorbidity 2;Comorbidity 3+    Comorbidities  HTN, OA, plantar facitis, dyspnea, bi-polar    Examination-Activity Limitations  Stand;Stairs;Squat;Sit;Continence;Dressing;Hygiene/Grooming;Lift;Locomotion Level;Bed Mobility;Bend    Examination-Participation Restrictions  Meal Prep;Cleaning;Community Activity;Driving;Shop;Laundry;Yard Work    Stability/Clinical Decision Making  Unstable/Unpredictable    International aid/development worker    Rehab Potential  Poor    PT Frequency  1x / week    PT Duration  6 weeks    PT Treatment/Interventions  ADLs/Self Care Home Management;Cryotherapy;Electrical Stimulation;Iontophoresis 4mg /ml Dexamethasone;Gait training;Moist Heat;DME Instruction;Stair training;Functional mobility training;Therapeutic activities;Therapeutic exercise;Neuromuscular re-education;Manual techniques;Patient/family education;Passive range of motion;Taping    PT Next Visit Plan  continue to advance standing activity; continue to encourage home exercises    PT Home Exercise Plan  seated bilateral ER seated low range horizontal abduction ankle pumps, low range LAQ. Patient advised noty to perfrom until she sees MD. Patient just shown how to do the exercises    Consulted and Agree with Plan of Care  Patient       Patient will benefit from skilled therapeutic intervention in order to improve the following deficits and impairments:  Abnormal gait,  Decreased range of motion, Increased fascial restricitons, Increased muscle spasms, Impaired UE functional use, Decreased knowledge of use of DME, Decreased mobility, Decreased strength, Improper body mechanics, Pain, Impaired perceived functional ability, Decreased safety awareness, Decreased endurance  Visit Diagnosis: Chronic bilateral low back pain without sciatica - Plan: PT plan of care cert/re-cert  Muscle spasm of back - Plan: PT plan of care cert/re-cert  Difficulty in walking, not elsewhere classified - Plan: PT plan of care cert/re-cert  Muscle weakness (generalized) - Plan: PT plan of care cert/re-cert  Radiculopathy, lumbar region - Plan: PT plan of care cert/re-cert     Problem List Patient Active Problem List   Diagnosis Date Noted  . Chronic diastolic heart failure ([place])   . Systemic lupus ([place])   . Morbid obesity due to excess calories ([place])   . Bipolar disorder ([place])   . Cellulitis 11/29/2018  . OSA (obstructive sleep apnea) 11/27/2018  . Chronic hypoxemic respiratory failure ([place]) 11/27/2018  . Primary osteoarthritis of both hands 04/19/2017  . Primary osteoarthritis of both knees 04/19/2017  . Primary osteoarthritis of both feet 04/19/2017  . DDD (degenerative disc disease), lumbar 04/19/2017  . History of bipolar  disorder 04/19/2017  . Hypothyroidism 06/04/2014  . HTN (hypertension) 01/23/2013  . HLD (hyperlipidemia) 01/23/2013    Carney Living PT DPT  07/18/2019, 1:29 PM  [facility] [address] [place], [place], [address] Phone: [phone]   Fax:  [phone]  Name: Jessica [name] MRN: [id number] Date of Birth: Oct 22, 1950

## 2019-07-19 ENCOUNTER — Ambulatory Visit: Payer: Medicare Other | Admitting: Rheumatology

## 2019-07-19 NOTE — Progress Notes (Signed)
Office Visit Note  Patient: Jessica [name]             Date of Birth: [birth year]           MRN: [id number]             PCP: Aurea Graff.Marlou Sa, MD Referring: Alroy Dust, Carlean Jews.Marlou Sa, MD Visit Date: 07/25/2019 Occupation: @GUAROCC @  Subjective:  Pain in multiple joints.   History of Present Illness: Jessica [name] is a 69 y.o. female with history of osteoarthritis.  She states in August 2020 she was hospitalized with congestive heart failure.  Since then they have modified her medications and she has been on medications to help her with the congestive heart failure.  But she had to come off the medications which were helping her with the arthritis.  She has been taking Tylenol.  She states she continues to have a lot of pain and discomfort in her bilateral hands, bilateral knee joints and her feet.  She has been having paresthesias in her hands.  She was seen by hand specialist.  She also has some arthritis in her neck and her back.  She has been referred to [facility].  Activities of Daily Living:  Patient reports morning stiffness for 20 minutes.   Patient Denies nocturnal pain.  Difficulty dressing/grooming: Denies Difficulty climbing stairs: Reports Difficulty getting out of chair: Reports Difficulty using hands for taps, buttons, cutlery, and/or writing: Reports  Review of Systems  Constitutional: Positive for fatigue. Negative for night sweats, weight gain and weight loss.  HENT: Positive for mouth dryness. Negative for mouth sores, trouble swallowing, trouble swallowing and nose dryness.   Eyes: Positive for dryness. Negative for pain, redness, itching and visual disturbance.  Respiratory: Positive for shortness of breath. Negative for cough and difficulty breathing.   Cardiovascular: Negative for chest pain, palpitations, hypertension, irregular heartbeat and swelling in legs/feet.  Gastrointestinal: Negative for blood in stool, constipation and diarrhea.    Endocrine: Positive for increased urination.  Genitourinary: Positive for involuntary urination. Negative for difficulty urinating and vaginal dryness.  Musculoskeletal: Positive for arthralgias, gait problem, joint pain, myalgias, morning stiffness and myalgias. Negative for joint swelling, muscle weakness and muscle tenderness.  Skin: Negative for color change, rash, hair loss, skin tightness, ulcers and sensitivity to sunlight.  Allergic/Immunologic: Negative for susceptible to infections.  Neurological: Positive for light-headedness, numbness, headaches and weakness. Negative for dizziness, memory loss and night sweats.  Hematological: Positive for bruising/bleeding tendency. Negative for swollen glands.  Psychiatric/Behavioral: Negative for depressed mood, confusion and sleep disturbance. The patient is not nervous/anxious.     PMFS History:  Patient Active Problem List   Diagnosis Date Noted  . Chronic diastolic heart failure ([place])   . Morbid obesity due to excess calories ([place])   . Bipolar disorder ([place])   . Cellulitis 11/29/2018  . OSA (obstructive sleep apnea) 11/27/2018  . Chronic hypoxemic respiratory failure ([place]) 11/27/2018  . Primary osteoarthritis of both hands 04/19/2017  . Primary osteoarthritis of both knees 04/19/2017  . Primary osteoarthritis of both feet 04/19/2017  . DDD (degenerative disc disease), lumbar 04/19/2017  . History of bipolar disorder 04/19/2017  . Hypothyroidism 06/04/2014  . HTN (hypertension) 01/23/2013  . HLD (hyperlipidemia) 01/23/2013    Past Medical History:  Diagnosis Date  . Arthritis    lower back, hands, knees  . Bipolar disorder ([place])   . Cellulitis and abscess of left leg 11/29/2018  . CHF (congestive heart failure) ([place])   .  Depression   . Dyspnea    with exertion  . GERD (gastroesophageal reflux disease)    patient unsure about this dx - no meds  . Headache   . History of IBS    watches diet  . Hyperlipidemia   .  Hypertension   . Hypothyroidism   . Lung edema, acute, with congestive heart failure ([place])   . Obese   . Plantar fasciitis   . Seasonal allergies   . Sleep apnea 2017   uses CPAP   . Spinal stenosis of lumbar region   . SVD (spontaneous vaginal delivery)    x 2  . Systemic lupus ([place])   . Thyroid disease     Family History  Problem Relation Age of Onset  . Thyroid disease Mother   . Breast cancer Mother   . Heart disease Mother   . Bipolar disorder Father   . Diabetes Father   . Heart disease Father   . Dementia Father    Past Surgical History:  Procedure Laterality Date  . CARPAL TUNNEL RELEASE Left 09/16/2017   Procedure: LEFT CARPAL TUNNEL RELEASE;  Surgeon: Daryll Brod, MD;  Location: [place];  Service: Orthopedics;  Laterality: Left;  . carpel tunnel surgery Right   . CATARACT EXTRACTION Bilateral 2007   w/ lens implants  . COLONOSCOPY    . DILATION AND CURETTAGE OF UTERUS    . EYE SURGERY    . FOOT SURGERY Right    hammer toe  . FRACTURE SURGERY     R tibia and fibula  . LEG SURGERY Right   . TONSILLECTOMY    . WISDOM TOOTH EXTRACTION     Social History   Social History Narrative   She lives in a single level home with her husband.  She has two grown children.   She taught college accounting courses, retired in 2005.    Highest level of education:  Doctorate in Administrator, sports History  Administered Date(s) Administered  . Influenza, High Dose Seasonal PF 02/12/2019  . PFIZER SARS-COV-2 Vaccination 06/24/2019, 07/16/2019     Objective: Vital Signs: BP 122/74 (BP Location: Left Wrist, Patient Position: Sitting, Cuff Size: Normal)   Pulse 89   Resp 18   Ht 5\' 2"  (1.575 m)   Wt 297 lb (134.7 kg)   BMI 54.32 kg/m    Physical Exam Vitals and nursing note reviewed.  Constitutional:      Appearance: She is well-developed.  HENT:     Head: Normocephalic and atraumatic.  Eyes:     Conjunctiva/sclera: Conjunctivae normal.  Cardiovascular:      Rate and Rhythm: Normal rate and regular rhythm.     Heart sounds: Normal heart sounds.  Pulmonary:     Effort: Pulmonary effort is normal.     Breath sounds: Normal breath sounds.  Abdominal:     General: Bowel sounds are normal.     Palpations: Abdomen is soft.  Musculoskeletal:     Cervical back: Normal range of motion.     Right lower leg: Edema present.     Left lower leg: Edema present.  Lymphadenopathy:     Cervical: No cervical adenopathy.  Skin:    General: Skin is warm and dry.     Capillary Refill: Capillary refill takes less than 2 seconds.  Neurological:     Mental Status: She is alert and oriented to person, place, and time.  Psychiatric:        Behavior: Behavior normal.  Musculoskeletal Exam: C-spine has limited range of motion with discomfort.  She has limited range of motion of thoracic and lumbar spine.  Shoulder joints and elbow joints with good range of motion.  She has good range of motion of bilateral wrist joints with no synovitis.  PIP and DIP thickening was noted.  Hip joints were difficult to assess.  Knee joints with good range of motion without any warmth or swelling or effusion.  She has significant bilateral pedal edema.  Ankle joints and MTPs had no synovitis on examination.  CDAI Exam: CDAI Score: -- Patient Global: --; Provider Global: -- Swollen: --; Tender: -- Joint Exam 07/25/2019   No joint exam has been documented for this visit   There is currently no information documented on the homunculus. Go to the Rheumatology activity and complete the homunculus joint exam.  Investigation: No additional findings.  Imaging: MM 3D SCREEN BREAST BILATERAL  Result Date: 07/03/2019 CLINICAL DATA:  Screening. EXAM: DIGITAL SCREENING BILATERAL MAMMOGRAM WITH TOMO AND CAD COMPARISON:  Previous exam(s). ACR Breast Density Category b: There are scattered areas of fibroglandular density. FINDINGS: There are no findings suspicious for malignancy. Images  were processed with CAD. IMPRESSION: No mammographic evidence of malignancy. A result letter of this screening mammogram will be mailed directly to the patient. RECOMMENDATION: Screening mammogram in one year. (Code:SM-B-01Y) BI-RADS CATEGORY  1: Negative. Electronically Signed   By: Margarette Canada M.D.   On: 07/03/2019 14:41    Recent Labs: Lab Results  Component Value Date   WBC 16.7 (H) 12/11/2018   HGB 14.4 12/11/2018   PLT 337 12/11/2018   NA 139 07/16/2019   K 5.0 07/16/2019   CL 103 07/16/2019   CO2 23 07/16/2019   GLUCOSE 82 07/16/2019   BUN 26 07/16/2019   CREATININE 0.90 07/16/2019   BILITOT 0.2 04/16/2019   ALKPHOS 146 (H) 04/16/2019   AST 22 04/16/2019   ALT 17 04/16/2019   PROT 6.6 04/16/2019   ALBUMIN 4.0 04/16/2019   CALCIUM 11.0 (H) 07/16/2019   GFRAA 75 07/16/2019    Speciality Comments: No specialty comments available.  Procedures:  No procedures performed Allergies: Ibuprofen, Codeine, Nabumetone, Other, Promethazine, Amoxicillin-pot clavulanate, Erythromycin base, and Promethazine-codeine   Assessment / Plan:     Visit Diagnoses: Primary osteoarthritis of both hands-she had no synovitis on examination.  She has DIP and PIP thickening.  Joint protection was discussed.  Primary osteoarthritis of both knees - chondromalacia patella-she continues to have some discomfort.  Primary osteoarthritis of both feet-she has been using proper fitting shoes which has been helpful.  Although she has been having a lot of pedal edema which causes discomfort.  DDD (degenerative disc disease), lumbar-she has been having increased lower back pain and also neck pain.  She states she has been referred to spine and scoliosis center.  There is not much to add for pain management.  She is on Cymbalta and gabapentin.  History of bipolar disorder-followed by psychiatrist.  History of hypertension-blood pressure is well controlled.  Chronic diastolic heart failure (HCC)-this is a  recent diagnosis.  History of urinary incontinence  History of IBS  History of hypothyroidism  Pedal edema  BMI 50.0-59.9, adult ([place])  Orders: No orders of the defined types were placed in this encounter.  No orders of the defined types were placed in this encounter.    Follow-Up Instructions: Return in about 1 year (around 07/24/2020) for Osteoarthritis.   Bo Merino, MD  Note - This record  has been created using [employer].  Chart creation errors have been sought, but may not always  have been located. Such creation errors do not reflect on  the standard of medical care.

## 2019-07-24 ENCOUNTER — Other Ambulatory Visit: Payer: Self-pay

## 2019-07-24 ENCOUNTER — Ambulatory Visit: Payer: Medicare Other | Admitting: Physical Therapy

## 2019-07-24 ENCOUNTER — Encounter: Payer: Self-pay | Admitting: Physical Therapy

## 2019-07-24 VITALS — BP 142/90

## 2019-07-24 DIAGNOSIS — G8929 Other chronic pain: Secondary | ICD-10-CM | POA: Diagnosis not present

## 2019-07-24 DIAGNOSIS — M6281 Muscle weakness (generalized): Secondary | ICD-10-CM

## 2019-07-24 DIAGNOSIS — M5416 Radiculopathy, lumbar region: Secondary | ICD-10-CM

## 2019-07-24 DIAGNOSIS — M62830 Muscle spasm of back: Secondary | ICD-10-CM | POA: Diagnosis not present

## 2019-07-24 DIAGNOSIS — M545 Low back pain: Secondary | ICD-10-CM | POA: Diagnosis not present

## 2019-07-24 DIAGNOSIS — R262 Difficulty in walking, not elsewhere classified: Secondary | ICD-10-CM | POA: Diagnosis not present

## 2019-07-25 ENCOUNTER — Telehealth: Payer: Self-pay | Admitting: Psychiatry

## 2019-07-25 ENCOUNTER — Encounter: Payer: Self-pay | Admitting: Rheumatology

## 2019-07-25 ENCOUNTER — Encounter: Payer: Self-pay | Admitting: Physical Therapy

## 2019-07-25 ENCOUNTER — Ambulatory Visit (INDEPENDENT_AMBULATORY_CARE_PROVIDER_SITE_OTHER): Payer: Medicare Other | Admitting: Rheumatology

## 2019-07-25 ENCOUNTER — Other Ambulatory Visit: Payer: Self-pay | Admitting: Psychiatry

## 2019-07-25 VITALS — BP 122/74 | HR 89 | Resp 18 | Ht 62.0 in | Wt 297.0 lb

## 2019-07-25 DIAGNOSIS — R600 Localized edema: Secondary | ICD-10-CM

## 2019-07-25 DIAGNOSIS — Z8659 Personal history of other mental and behavioral disorders: Secondary | ICD-10-CM | POA: Diagnosis not present

## 2019-07-25 DIAGNOSIS — Z87898 Personal history of other specified conditions: Secondary | ICD-10-CM

## 2019-07-25 DIAGNOSIS — M19042 Primary osteoarthritis, left hand: Secondary | ICD-10-CM

## 2019-07-25 DIAGNOSIS — M19071 Primary osteoarthritis, right ankle and foot: Secondary | ICD-10-CM | POA: Diagnosis not present

## 2019-07-25 DIAGNOSIS — I5032 Chronic diastolic (congestive) heart failure: Secondary | ICD-10-CM

## 2019-07-25 DIAGNOSIS — M170 Bilateral primary osteoarthritis of knee: Secondary | ICD-10-CM | POA: Diagnosis not present

## 2019-07-25 DIAGNOSIS — Z8719 Personal history of other diseases of the digestive system: Secondary | ICD-10-CM

## 2019-07-25 DIAGNOSIS — M19072 Primary osteoarthritis, left ankle and foot: Secondary | ICD-10-CM

## 2019-07-25 DIAGNOSIS — Z6841 Body Mass Index (BMI) 40.0 and over, adult: Secondary | ICD-10-CM

## 2019-07-25 DIAGNOSIS — Z8639 Personal history of other endocrine, nutritional and metabolic disease: Secondary | ICD-10-CM

## 2019-07-25 DIAGNOSIS — M5136 Other intervertebral disc degeneration, lumbar region: Secondary | ICD-10-CM

## 2019-07-25 DIAGNOSIS — M19041 Primary osteoarthritis, right hand: Secondary | ICD-10-CM | POA: Diagnosis not present

## 2019-07-25 DIAGNOSIS — Z8679 Personal history of other diseases of the circulatory system: Secondary | ICD-10-CM | POA: Diagnosis not present

## 2019-07-25 DIAGNOSIS — M51369 Other intervertebral disc degeneration, lumbar region without mention of lumbar back pain or lower extremity pain: Secondary | ICD-10-CM

## 2019-07-25 DIAGNOSIS — F900 Attention-deficit hyperactivity disorder, predominantly inattentive type: Secondary | ICD-10-CM

## 2019-07-25 MED ORDER — LISDEXAMFETAMINE DIMESYLATE 60 MG PO CAPS: 60.0000 mg | ORAL_CAPSULE | ORAL | 0 refills | Status: DC

## 2019-07-25 NOTE — Therapy (Signed)
[place], [place], [address] Phone: [phone]   Fax:  [phone]  Physical Therapy Treatment  Patient Details  Name: Jessica [name] MRN: [id number] Date of Birth: 19-May-1950 Referring Provider (PT): Donnie Coffin MD    Encounter Date: 07/24/2019  PT End of Session - 07/25/19 1139    Visit Number  5    Number of Visits  10    Date for PT Re-Evaluation  08/29/19    Authorization Type  medicare progress note after 10 visits    PT Start Time  3300    PT Stop Time  1455    PT Time Calculation (min)  40 min    Activity Tolerance  Patient limited by fatigue    Behavior During Therapy  [facility] for tasks assessed/performed       Past Medical History:  Diagnosis Date  . Arthritis    lower back, hands, knees  . Bipolar disorder ([place])   . Cellulitis and abscess of left leg 11/29/2018  . CHF (congestive heart failure) ([place])   . Depression   . Dyspnea    with exertion  . GERD (gastroesophageal reflux disease)    patient unsure about this dx - no meds  . Headache   . History of IBS    watches diet  . Hyperlipidemia   . Hypertension   . Hypothyroidism   . Lung edema, acute, with congestive heart failure ([place])   . Obese   . Plantar fasciitis   . Seasonal allergies   . Sleep apnea 2017   uses CPAP   . Spinal stenosis of lumbar region   . SVD (spontaneous vaginal delivery)    x 2  . Systemic lupus ([place])   . Thyroid disease     Past Surgical History:  Procedure Laterality Date  . CARPAL TUNNEL RELEASE Left 09/16/2017   Procedure: LEFT CARPAL TUNNEL RELEASE;  Surgeon: Daryll Brod, MD;  Location: [place];  Service: Orthopedics;  Laterality: Left;  . carpel tunnel surgery Right   . CATARACT EXTRACTION Bilateral 2007   w/ lens implants  . COLONOSCOPY    . DILATION AND CURETTAGE OF UTERUS    . EYE SURGERY    . FOOT SURGERY Right    hammer toe  . FRACTURE SURGERY     R tibia and fibula  . LEG  SURGERY Right   . TONSILLECTOMY    . WISDOM TOOTH EXTRACTION      Vitals:   07/24/19 1427  BP: (!) 142/90    Subjective Assessment - 07/25/19 1007    Subjective  Patient reports over the last week she has been more generally fatigued. She feels it is likley her allergies. Therapyt checked her B/P which was meassured at 142/90. She rports her neck continues to hurt her at night.    Limitations  Sitting;Lifting;Standing;Walking;House hold activities    How long can you sit comfortably?  any prolonged poistioning causes pain    How long can you stand comfortably?  3-4 minutes before she has too much discomfort    How long can you walk comfortably?  sugnificant dyspnea coming from the lobby to a treatment room    Patient Stated Goals  to be able to walk further more comfortably    Currently in Pain?  Yes    Pain Score  3    hurting when she stands   Pain Location  Back    Pain Orientation  Right;Left  Pain Descriptors / Indicators  Aching    Pain Type  Chronic pain    Pain Radiating Towards  pain that radiates into the legs    Pain Onset  More than a month ago    Pain Frequency  Intermittent    Aggravating Factors   standing and walking    Pain Relieving Factors  changing positions    Effect of Pain on Daily Activities  pain with activity                       [facility] Adult PT Treatment/Exercise - 07/25/19 0001      Self-Care   Other Self-Care Comments   reviewed strategies for holding her phone; reviewed use of the thera-cane for trigger point release and where to buy it.       Lumbar Exercises: Seated   Other Seated Lumbar Exercises  scap retraction x20; shoulder extension x20;     Other Seated Lumbar Exercises  bilateral er yellow 2x10; horizontal abduction yellow 2x10  ball roll foward x10 lateral x10 each side             PT Education - 07/25/19 1138    Education Details  reviewed cell phone holders for her hands and thera-cane use    Person(s)  Educated  Patient    Methods  Explanation;Demonstration;Tactile cues;Verbal cues    Comprehension  Returned demonstration;Verbalized understanding;Verbal cues required;Tactile cues required       PT Short Term Goals - 07/25/19 1251      PT SHORT TERM GOAL #1   Title  Patient will be indepdnent with very basic HEP    Time  3    Period  Weeks    Status  On-going    Target Date  07/25/19      PT SHORT TERM GOAL #2   Title  Patient will ambualte 200' without a seated rest break    Baseline  160'    Time  3    Period  Weeks      PT SHORT TERM GOAL #3   Title  Pt will state 3 pain management strategies or proper posture /body mechanics for household chores    Baseline  Therapy continues to review pain management strategies     Time  3    Period  Weeks    Status  On-going    Target Date  06/12/19      PT SHORT TERM GOAL #4   Title  Pt will be able to stand and walk for 400 feet with =/< 4/10 pain    Baseline  164 ft prior to seated break due to pain on 08/29/2017, unable to assess 09/13/2017 due to pt's joint pain.     Time  4    Period  Weeks    Status  Unable to assess    Target Date  08/23/17      PT SHORT TERM GOAL #5   Title  Pt will tolerate functional testing for long term goal assessment (sit to stand, 2 Min walk test)     Baseline  can perform 10 consecutive sit-stands without UE support - untimed    Time  4    Period  Weeks    Status  Partially Met        PT Long Term Goals - 05/22/19 1307      PT LONG TERM GOAL #1   Title  Patient will transfer sit to stand witout dyspnea  and pain    Time  6    Period  Weeks    Status  New    Target Date  07/03/19      PT LONG TERM GOAL #2   Title  Patient will ambualte 300' without a rest break on 2 min walk test in order to improve community ambualtion    Time  6    Period  Weeks    Status  New    Target Date  07/03/19      PT LONG TERM GOAL #3   Title  Patient will stand a t home for 20 min without self reported  increased lower back pain    Time  6    Period  Weeks    Status  New    Target Date  07/03/19            Plan - 07/25/19 1142    Clinical Impression Statement  The patient continues to have some upper back tightness that is effecting her ability to stand up straight when she walks. She was given light stretching and reveiwed postural exercises which hshe has alreayd worked on for her back. Therapy also reviewed the use of the thera-cane for several different body parts. She was also shown options for holding her cell phone 2nd to significant hand pain.    Personal Factors and Comorbidities  Comorbidity 1;Comorbidity 2;Comorbidity 3+    Comorbidities  HTN, OA, plantar facitis, dyspnea, bi-polar    Examination-Activity Limitations  Stand;Stairs;Squat;Sit;Continence;Dressing;Hygiene/Grooming;Lift;Locomotion Level;Bed Mobility;Bend    Examination-Participation Restrictions  Meal Prep;Cleaning;Community Activity;Driving;Shop;Laundry;Yard Work    Stability/Clinical Decision Making  Unstable/Unpredictable    International aid/development worker    Rehab Potential  Poor    PT Frequency  1x / week    PT Duration  6 weeks    PT Treatment/Interventions  ADLs/Self Care Home Management;Cryotherapy;Electrical Stimulation;Iontophoresis 52m/ml Dexamethasone;Gait training;Moist Heat;DME Instruction;Stair training;Functional mobility training;Therapeutic activities;Therapeutic exercise;Neuromuscular re-education;Manual techniques;Patient/family education;Passive range of motion;Taping    PT Next Visit Plan  continue to advance standing activity; continue to encourage home exercises    PT Home Exercise Plan  seated bilateral ER seated low range horizontal abduction ankle pumps, low range LAQ. Patient advised noty to perfrom until she sees MD. Patient just shown how to do the exercises    Consulted and Agree with Plan of Care  Patient       Patient will benefit from skilled therapeutic intervention in order to  improve the following deficits and impairments:  Abnormal gait, Decreased range of motion, Increased fascial restricitons, Increased muscle spasms, Impaired UE functional use, Decreased knowledge of use of DME, Decreased mobility, Decreased strength, Improper body mechanics, Pain, Impaired perceived functional ability, Decreased safety awareness, Decreased endurance  Visit Diagnosis: Chronic bilateral low back pain without sciatica  Muscle spasm of back  Difficulty in walking, not elsewhere classified  Muscle weakness (generalized)  Radiculopathy, lumbar region     Problem List Patient Active Problem List   Diagnosis Date Noted  . Chronic diastolic heart failure ([place]   . Systemic lupus ([place]   . Morbid obesity due to excess calories ([place]   . Bipolar disorder ([place]   . Cellulitis 11/29/2018  . OSA (obstructive sleep apnea) 11/27/2018  . Chronic hypoxemic respiratory failure ([place] 11/27/2018  . Primary osteoarthritis of both hands 04/19/2017  . Primary osteoarthritis of both knees 04/19/2017  . Primary osteoarthritis of both feet 04/19/2017  . DDD (degenerative disc disease), lumbar 04/19/2017  . History of  bipolar disorder 04/19/2017  . Hypothyroidism 06/04/2014  . HTN (hypertension) 01/23/2013  . HLD (hyperlipidemia) 01/23/2013    Rachael Darby DPT  07/25/2019, 12:59 PM  [facility] [address] [place], [place], [address] Phone: [phone]   Fax:  [phone]  Name: Josphine Laffey MRN: [id number] Date of Birth: 1951-02-01

## 2019-07-25 NOTE — Telephone Encounter (Signed)
Sent!

## 2019-07-25 NOTE — Telephone Encounter (Signed)
Pt called requesting a refill on her Vyvanse. She stated to request it as a vacation refill so it will be filled today.

## 2019-07-25 NOTE — Telephone Encounter (Signed)
Jessica [name] called again and the pharmacy told her she doesn't have Vyvanse refills on file.  So she wants one sent in with request for vacation early refill.  The chart shows there should be one at the pharmacy for 08/07/19.  They just need to find that one and just refill early so she has enough to take on vacation.  Anway, she wants it today so she can pick up before they leave.

## 2019-08-09 ENCOUNTER — Encounter: Payer: Medicare Other | Admitting: Physical Therapy

## 2019-08-10 DIAGNOSIS — M47892 Other spondylosis, cervical region: Secondary | ICD-10-CM | POA: Diagnosis not present

## 2019-08-10 DIAGNOSIS — M5030 Other cervical disc degeneration, unspecified cervical region: Secondary | ICD-10-CM | POA: Diagnosis not present

## 2019-08-10 DIAGNOSIS — M4312 Spondylolisthesis, cervical region: Secondary | ICD-10-CM | POA: Diagnosis not present

## 2019-08-10 DIAGNOSIS — M545 Low back pain: Secondary | ICD-10-CM | POA: Diagnosis not present

## 2019-08-14 ENCOUNTER — Ambulatory Visit: Payer: Medicare Other | Attending: Family Medicine | Admitting: Physical Therapy

## 2019-08-14 ENCOUNTER — Encounter: Payer: Self-pay | Admitting: Physical Therapy

## 2019-08-14 ENCOUNTER — Other Ambulatory Visit: Payer: Self-pay

## 2019-08-14 DIAGNOSIS — M5416 Radiculopathy, lumbar region: Secondary | ICD-10-CM | POA: Insufficient documentation

## 2019-08-14 DIAGNOSIS — G8929 Other chronic pain: Secondary | ICD-10-CM | POA: Insufficient documentation

## 2019-08-14 DIAGNOSIS — M545 Low back pain: Secondary | ICD-10-CM | POA: Insufficient documentation

## 2019-08-14 DIAGNOSIS — R262 Difficulty in walking, not elsewhere classified: Secondary | ICD-10-CM | POA: Diagnosis not present

## 2019-08-14 DIAGNOSIS — M62830 Muscle spasm of back: Secondary | ICD-10-CM | POA: Diagnosis not present

## 2019-08-14 DIAGNOSIS — M6281 Muscle weakness (generalized): Secondary | ICD-10-CM | POA: Diagnosis not present

## 2019-08-14 NOTE — Therapy (Signed)
[place], [place], [address] Phone: [phone]   Fax:  [phone]  Physical Therapy Treatment  Patient Details  Name: Jessica [name] MRN: [id number] Date of Birth: 1951-02-27 Referring Provider (PT): Donnie Coffin MD    Encounter Date: 08/14/2019  PT End of Session - 08/14/19 1424    Visit Number  6    Number of Visits  10    Date for PT Re-Evaluation  08/29/19    Authorization Type  medicare progress note after 10 visits    PT Start Time  [id number]   Paitient took 5 min getting into the clinic   PT Stop Time  1459    PT Time Calculation (min)  39 min    Activity Tolerance  Patient limited by fatigue    Behavior During Therapy  [facility] for tasks assessed/performed       Past Medical History:  Diagnosis Date  . Arthritis    lower back, hands, knees  . Bipolar disorder ([place])   . Cellulitis and abscess of left leg 11/29/2018  . CHF (congestive heart failure) ([place])   . Depression   . Dyspnea    with exertion  . GERD (gastroesophageal reflux disease)    patient unsure about this dx - no meds  . Headache   . History of IBS    watches diet  . Hyperlipidemia   . Hypertension   . Hypothyroidism   . Lung edema, acute, with congestive heart failure ([place])   . Obese   . Plantar fasciitis   . Seasonal allergies   . Sleep apnea 2017   uses CPAP   . Spinal stenosis of lumbar region   . SVD (spontaneous vaginal delivery)    x 2  . Systemic lupus ([place])   . Thyroid disease     Past Surgical History:  Procedure Laterality Date  . CARPAL TUNNEL RELEASE Left 09/16/2017   Procedure: LEFT CARPAL TUNNEL RELEASE;  Surgeon: Daryll Brod, MD;  Location: [place];  Service: Orthopedics;  Laterality: Left;  . carpel tunnel surgery Right   . CATARACT EXTRACTION Bilateral 2007   w/ lens implants  . COLONOSCOPY    . DILATION AND CURETTAGE OF UTERUS    . EYE SURGERY    . FOOT SURGERY Right    hammer toe  .  FRACTURE SURGERY     R tibia and fibula  . LEG SURGERY Right   . TONSILLECTOMY    . WISDOM TOOTH EXTRACTION      There were no vitals filed for this visit.  Subjective Assessment - 08/14/19 1523    Subjective  Patient did well driving down on her vacation. She feels like generally her endurance has improved. She is now able to walk into the clinic without becoming fatigued. She was able to riude down to [facility] head without significant pain.    Limitations  Sitting;Lifting;Standing;Walking;House hold activities    How long can you sit comfortably?  any prolonged poistioning causes pain    How long can you stand comfortably?  3-4 minutes before she has too much discomfort    How long can you walk comfortably?  sugnificant dyspnea coming from the lobby to a treatment room    Patient Stated Goals  to be able to walk further more comfortably    Currently in Pain?  Yes    Pain Score  4     Pain Location  Back    Pain Orientation  Right;Left    Pain Descriptors / Indicators  Aching    Pain Type  Chronic pain    Pain Radiating Towards  no radiation    Pain Onset  More than a month ago    Pain Frequency  Intermittent    Aggravating Factors   standing and walking    Pain Relieving Factors  changing position    Effect of Pain on Daily Activities  pain with activity                       [facility] Adult PT Treatment/Exercise - 08/14/19 0001      Lumbar Exercises: Stretches   Other Lumbar Stretch Exercise  seated bal roll x10 5 sec hold lateral ball roll x10 5 sec hold each side; ball press x10 5 sec hold;       Lumbar Exercises: Seated   Other Seated Lumbar Exercises  scap retraction x20; s    Other Seated Lumbar Exercises  ball squeeze 2x10; hip abduction yellow 2x10      Manual Therapy   Manual therapy comments  upper trap / peri-scapular IASTYM to iprove posutre. Patient is having pain and her neck that is causing her to have to slouch.              PT Education -  08/14/19 1526    Education Details  reviewed technique with ther-ex    Person(s) Educated  Patient    Methods  Explanation;Demonstration;Verbal cues;Tactile cues    Comprehension  Verbalized understanding;Returned demonstration;Verbal cues required;Tactile cues required       PT Short Term Goals - 08/14/19 1532      PT SHORT TERM GOAL #1   Title  Patient will be indepdnent with very basic HEP    Baseline  able to return demonstrate sit<->sidelying without over use of UE in prep for carpal tunnel surgery. Can sit-stand without UEs     Time  3    Period  Weeks    Status  On-going    Target Date  07/25/19      PT SHORT TERM GOAL #2   Title  Patient will ambualte 200' without a seated rest break    Baseline  160'    Time  3    Period  Weeks    Status  Revised    Target Date  08/29/19      PT SHORT TERM GOAL #3   Title  Pt will state 3 pain management strategies or proper posture /body mechanics for household chores    Baseline  Therapy continues to review pain management strategies     Time  3    Period  Weeks    Status  On-going    Target Date  06/12/19      PT SHORT TERM GOAL #4   Title  Pt will be able to stand and walk for 400 feet with =/< 4/10 pain    Baseline  164 ft prior to seated break due to pain on 08/29/2017, unable to assess 09/13/2017 due to pt's joint pain.     Time  4    Period  Weeks    Status  Unable to assess    Target Date  08/23/17      PT SHORT TERM GOAL #5   Title  Pt will tolerate functional testing for long term goal assessment (sit to stand, 2 Min walk test)     Baseline  can perform 10 consecutive  sit-stands without UE support - untimed    Time  4    Period  Weeks    Status  On-going        PT Long Term Goals - 05/22/19 1307      PT LONG TERM GOAL #1   Title  Patient will transfer sit to stand witout dyspnea and pain    Time  6    Period  Weeks    Status  New    Target Date  07/03/19      PT LONG TERM GOAL #2   Title  Patient will  ambualte 300' without a rest break on 2 min walk test in order to improve community ambualtion    Time  6    Period  Weeks    Status  New    Target Date  07/03/19      PT LONG TERM GOAL #3   Title  Patient will stand a t home for 20 min without self reported increased lower back pain    Time  6    Period  Weeks    Status  New    Target Date  07/03/19            Plan - 08/14/19 1529    Clinical Impression Statement  Patient tolersated treatment well. She had a mild increase in knee pain with LAQ. She would benefit from further skilled therapy to continue to improve endurance and tolerance to activity.    Personal Factors and Comorbidities  Comorbidity 1;Comorbidity 2;Comorbidity 3+    Comorbidities  HTN, OA, plantar facitis, dyspnea, bi-polar    Examination-Activity Limitations  Stand;Stairs;Squat;Sit;Continence;Dressing;Hygiene/Grooming;Lift;Locomotion Level;Bed Mobility;Bend    Stability/Clinical Decision Making  Unstable/Unpredictable    Clinical Decision Making  High    PT Frequency  1x / week    PT Duration  6 weeks    PT Treatment/Interventions  ADLs/Self Care Home Management;Cryotherapy;Electrical Stimulation;Iontophoresis 4mg /ml Dexamethasone;Gait training;Moist Heat;DME Instruction;Stair training;Functional mobility training;Therapeutic activities;Therapeutic exercise;Neuromuscular re-education;Manual techniques;Patient/family education;Passive range of motion;Taping    PT Next Visit Plan  continue to advance standing activity; continue to encourage home exercises    PT Home Exercise Plan  seated bilateral ER seated low range horizontal abduction ankle pumps, low range LAQ. Patient advised noty to perfrom until she sees MD. Patient just shown how to do the exercises    Consulted and Agree with Plan of Care  Patient       Patient will benefit from skilled therapeutic intervention in order to improve the following deficits and impairments:  Abnormal gait, Decreased range of  motion, Increased fascial restricitons, Increased muscle spasms, Impaired UE functional use, Decreased knowledge of use of DME, Decreased mobility, Decreased strength, Improper body mechanics, Pain, Impaired perceived functional ability, Decreased safety awareness, Decreased endurance  Visit Diagnosis: Chronic bilateral low back pain without sciatica  Muscle spasm of back  Difficulty in walking, not elsewhere classified  Muscle weakness (generalized)     Problem List Patient Active Problem List   Diagnosis Date Noted  . Chronic diastolic heart failure ([place])   . Morbid obesity due to excess calories ([place])   . Bipolar disorder ([place])   . Cellulitis 11/29/2018  . OSA (obstructive sleep apnea) 11/27/2018  . Chronic hypoxemic respiratory failure ([place]) 11/27/2018  . Primary osteoarthritis of both hands 04/19/2017  . Primary osteoarthritis of both knees 04/19/2017  . Primary osteoarthritis of both feet 04/19/2017  . DDD (degenerative disc disease), lumbar 04/19/2017  . History of bipolar disorder 04/19/2017  .  Hypothyroidism 06/04/2014  . HTN (hypertension) 01/23/2013  . HLD (hyperlipidemia) 01/23/2013    Carney Living PT DPT  08/14/2019, 3:38 PM  [facility] [address] [place], [place], [address] Phone: [phone]   Fax:  [phone]  Name: Jessica [name] MRN: [id number] Date of Birth: [birth year]

## 2019-08-17 ENCOUNTER — Other Ambulatory Visit: Payer: Self-pay | Admitting: Orthopaedic Surgery

## 2019-08-17 DIAGNOSIS — M4312 Spondylolisthesis, cervical region: Secondary | ICD-10-CM

## 2019-08-19 ENCOUNTER — Other Ambulatory Visit: Payer: Self-pay | Admitting: Psychiatry

## 2019-08-21 ENCOUNTER — Other Ambulatory Visit: Payer: Self-pay

## 2019-08-21 ENCOUNTER — Ambulatory Visit: Payer: Medicare Other | Admitting: Physical Therapy

## 2019-08-21 ENCOUNTER — Encounter: Payer: Self-pay | Admitting: Physical Therapy

## 2019-08-21 DIAGNOSIS — R262 Difficulty in walking, not elsewhere classified: Secondary | ICD-10-CM | POA: Diagnosis not present

## 2019-08-21 DIAGNOSIS — M6281 Muscle weakness (generalized): Secondary | ICD-10-CM

## 2019-08-21 DIAGNOSIS — M5416 Radiculopathy, lumbar region: Secondary | ICD-10-CM | POA: Diagnosis not present

## 2019-08-21 DIAGNOSIS — M62830 Muscle spasm of back: Secondary | ICD-10-CM

## 2019-08-21 DIAGNOSIS — G8929 Other chronic pain: Secondary | ICD-10-CM

## 2019-08-21 DIAGNOSIS — M545 Low back pain, unspecified: Secondary | ICD-10-CM

## 2019-08-22 ENCOUNTER — Encounter: Payer: Self-pay | Admitting: Physical Therapy

## 2019-08-22 NOTE — Therapy (Signed)
[place], [place], [address] Phone: [phone]   Fax:  [phone]  Physical Therapy Treatment  Patient Details  Name: Jessica [name] MRN: [id number] Date of Birth: 03-May-1951 Referring Provider (PT): Donnie Coffin MD    Encounter Date: 08/21/2019  PT End of Session - 08/22/19 1241    Visit Number  7    Number of Visits  10    Date for PT Re-Evaluation  08/29/19    Authorization Type  medicare progress note after 10 visits    PT Start Time  1634    PT Stop Time  1712    PT Time Calculation (min)  38 min    Activity Tolerance  Patient limited by fatigue    Behavior During Therapy  [facility] for tasks assessed/performed       Past Medical History:  Diagnosis Date  . Arthritis    lower back, hands, knees  . Bipolar disorder ([place])   . Cellulitis and abscess of left leg 11/29/2018  . CHF (congestive heart failure) ([place])   . Depression   . Dyspnea    with exertion  . GERD (gastroesophageal reflux disease)    patient unsure about this dx - no meds  . Headache   . History of IBS    watches diet  . Hyperlipidemia   . Hypertension   . Hypothyroidism   . Lung edema, acute, with congestive heart failure ([place])   . Obese   . Plantar fasciitis   . Seasonal allergies   . Sleep apnea 2017   uses CPAP   . Spinal stenosis of lumbar region   . SVD (spontaneous vaginal delivery)    x 2  . Systemic lupus ([place])   . Thyroid disease     Past Surgical History:  Procedure Laterality Date  . CARPAL TUNNEL RELEASE Left 09/16/2017   Procedure: LEFT CARPAL TUNNEL RELEASE;  Surgeon: Daryll Brod, MD;  Location: [place];  Service: Orthopedics;  Laterality: Left;  . carpel tunnel surgery Right   . CATARACT EXTRACTION Bilateral 2007   w/ lens implants  . COLONOSCOPY    . DILATION AND CURETTAGE OF UTERUS    . EYE SURGERY    . FOOT SURGERY Right    hammer toe  . FRACTURE SURGERY     R tibia and fibula  . LEG  SURGERY Right   . TONSILLECTOMY    . WISDOM TOOTH EXTRACTION      There were no vitals filed for this visit.  Subjective Assessment - 08/21/19 1647    Subjective  Patient did well driving down on her vacation. She feels like generally her endurance has improved. She is now able to walk into the clinic without becoming fatigued. She was able to riude down to [facility] head without significant pain.    Limitations  Sitting;Lifting;Standing;Walking;House hold activities    How long can you sit comfortably?  any prolonged poistioning causes pain    How long can you stand comfortably?  3-4 minutes before she has too much discomfort    How long can you walk comfortably?  sugnificant dyspnea coming from the lobby to a treatment room    Patient Stated Goals  to be able to walk further more comfortably    Currently in Pain?  Yes    Pain Score  4     Pain Location  Back    Pain Orientation  Right;Left    Pain Descriptors / Indicators  Aching;Burning    Pain Type  Chronic pain    Pain Onset  More than a month ago    Aggravating Factors   standing and walking    Pain Relieving Factors  changing positions    Effect of Pain on Daily Activities  pain with activity                       [facility] Adult PT Treatment/Exercise - 08/22/19 0001      Self-Care   Other Self-Care Comments   reviewed progression of activity both in the clinic and at home.       Lumbar Exercises: Stretches   Other Lumbar Stretch Exercise  seated bal roll x10 5 sec hold lateral ball roll x10 5 sec hold each side; ball press x10 5 sec hold;       Lumbar Exercises: Standing   Other Standing Lumbar Exercises  standing weightshift 2x10 with seated rest break in between sets       Lumbar Exercises: Seated   Other Seated Lumbar Exercises  scap retraction x20; bilateral ER 2x10 yellow; horizontal abduction yellow 2x10;     Other Seated Lumbar Exercises  Ball squeze x20; yellow band 2x10 yellow              PT  Education - 08/22/19 1241    Education Details  progression of POC into standing activity    Person(s) Educated  Patient    Methods  Explanation;Demonstration;Tactile cues;Verbal cues    Comprehension  Verbalized understanding;Returned demonstration;Verbal cues required;Tactile cues required       PT Short Term Goals - 08/14/19 1532      PT SHORT TERM GOAL #1   Title  Patient will be indepdnent with very basic HEP    Baseline  able to return demonstrate sit<->sidelying without over use of UE in prep for carpal tunnel surgery. Can sit-stand without UEs     Time  3    Period  Weeks    Status  On-going    Target Date  07/25/19      PT SHORT TERM GOAL #2   Title  Patient will ambualte 200' without a seated rest break    Baseline  160'    Time  3    Period  Weeks    Status  Revised    Target Date  08/29/19      PT SHORT TERM GOAL #3   Title  Pt will state 3 pain management strategies or proper posture /body mechanics for household chores    Baseline  Therapy continues to review pain management strategies     Time  3    Period  Weeks    Status  On-going    Target Date  06/12/19      PT SHORT TERM GOAL #4   Title  Pt will be able to stand and walk for 400 feet with =/< 4/10 pain    Baseline  164 ft prior to seated break due to pain on 08/29/2017, unable to assess 09/13/2017 due to pt's joint pain.     Time  4    Period  Weeks    Status  Unable to assess    Target Date  08/23/17      PT SHORT TERM GOAL #5   Title  Pt will tolerate functional testing for long term goal assessment (sit to stand, 2 Min walk test)     Baseline  can perform 10 consecutive  sit-stands without UE support - untimed    Time  4    Period  Weeks    Status  On-going        PT Long Term Goals - 08/22/19 1254      PT LONG TERM GOAL #1   Title  Patient will transfer sit to stand witout dyspnea and pain    Baseline  Pt currently not in routine walking program    Time  6    Period  Weeks    Status   On-going      PT LONG TERM GOAL #2   Title  Patient will ambualte 300' without a rest break on 2 min walk test in order to improve community ambualtion    Baseline  8-10 minutes    Time  6    Period  Weeks    Status  On-going      PT LONG TERM GOAL #3   Title  Patient will stand a t home for 20 min without self reported increased lower back pain    Time  6    Period  Weeks    Status  On-going            Plan - 08/22/19 1247    Clinical Impression Statement  Therapy added in standing activity today. She was able to do standing weight shift with minor fatigue and SOB. Therapy will continue to progress this activity as tolerated. The  patient continues to be limited by multi joint OA and deconditioning but she is motivated and does everything that she can. She is noticing small improvements in general mobility. Therapy will continue to progress as tolerated.    Personal Factors and Comorbidities  Comorbidity 1;Comorbidity 2;Comorbidity 3+    Comorbidities  HTN, OA, plantar facitis, dyspnea, bi-polar    Examination-Activity Limitations  Stand;Stairs;Squat;Sit;Continence;Dressing;Hygiene/Grooming;Lift;Locomotion Level;Bed Mobility;Bend    Examination-Participation Restrictions  Meal Prep;Cleaning;Community Activity;Driving;Shop;Laundry;Yard Work    Stability/Clinical Decision Making  Unstable/Unpredictable    International aid/development worker    Rehab Potential  Poor    PT Frequency  1x / week    PT Duration  6 weeks    PT Treatment/Interventions  ADLs/Self Care Home Management;Cryotherapy;Electrical Stimulation;Iontophoresis 4mg /ml Dexamethasone;Gait training;Moist Heat;DME Instruction;Stair training;Functional mobility training;Therapeutic activities;Therapeutic exercise;Neuromuscular re-education;Manual techniques;Patient/family education;Passive range of motion;Taping    PT Next Visit Plan  continue to advance standing activity; continue to encourage home exercises    PT Home Exercise  Plan  seated bilateral ER seated low range horizontal abduction ankle pumps, low range LAQ. Patient advised noty to perfrom until she sees MD. Patient just shown how to do the exercises    Consulted and Agree with Plan of Care  Patient       Patient will benefit from skilled therapeutic intervention in order to improve the following deficits and impairments:  Abnormal gait, Decreased range of motion, Increased fascial restricitons, Increased muscle spasms, Impaired UE functional use, Decreased knowledge of use of DME, Decreased mobility, Decreased strength, Improper body mechanics, Pain, Impaired perceived functional ability, Decreased safety awareness, Decreased endurance  Visit Diagnosis: Chronic bilateral low back pain without sciatica  Muscle spasm of back  Difficulty in walking, not elsewhere classified  Muscle weakness (generalized)     Problem List Patient Active Problem List   Diagnosis Date Noted  . Chronic diastolic heart failure ([place])   . Morbid obesity due to excess calories ([place])   . Bipolar disorder ([place])   . Cellulitis 11/29/2018  . OSA (obstructive  sleep apnea) 11/27/2018  . Chronic hypoxemic respiratory failure ([place]) 11/27/2018  . Primary osteoarthritis of both hands 04/19/2017  . Primary osteoarthritis of both knees 04/19/2017  . Primary osteoarthritis of both feet 04/19/2017  . DDD (degenerative disc disease), lumbar 04/19/2017  . History of bipolar disorder 04/19/2017  . Hypothyroidism 06/04/2014  . HTN (hypertension) 01/23/2013  . HLD (hyperlipidemia) 01/23/2013    Carney Living PT DPT  08/22/2019, 12:58 PM  [facility] [address] [place], [place], [address] Phone: [phone]   Fax:  [phone]  Name: Jessica [name] MRN: [id number] Date of Birth: 08-21-1950

## 2019-08-28 ENCOUNTER — Ambulatory Visit: Payer: Medicare Other | Admitting: Physical Therapy

## 2019-08-28 ENCOUNTER — Other Ambulatory Visit: Payer: Self-pay

## 2019-08-28 ENCOUNTER — Encounter: Payer: Self-pay | Admitting: Physical Therapy

## 2019-08-28 DIAGNOSIS — M62830 Muscle spasm of back: Secondary | ICD-10-CM

## 2019-08-28 DIAGNOSIS — M5416 Radiculopathy, lumbar region: Secondary | ICD-10-CM

## 2019-08-28 DIAGNOSIS — M6281 Muscle weakness (generalized): Secondary | ICD-10-CM

## 2019-08-28 DIAGNOSIS — R262 Difficulty in walking, not elsewhere classified: Secondary | ICD-10-CM | POA: Diagnosis not present

## 2019-08-28 DIAGNOSIS — G8929 Other chronic pain: Secondary | ICD-10-CM

## 2019-08-28 DIAGNOSIS — M545 Low back pain: Secondary | ICD-10-CM | POA: Diagnosis not present

## 2019-08-28 NOTE — Therapy (Signed)
[place], [place], [address] Phone: [phone]   Fax:  [phone]  Physical Therapy Treatment  Patient Details  Name: Jessica [name] MRN: [id number] Date of Birth: 1951-04-21 Referring Provider (PT): Donnie Coffin MD    Encounter Date: 08/28/2019  PT End of Session - 08/28/19 1535    Visit Number  8    Number of Visits  10    Date for PT Re-Evaluation  08/29/19    Authorization Type  medicare progress note after 10 visits    PT Start Time  [id number]   husabndt took first part of her visit   PT Stop Time  1500    PT Time Calculation (min)  38 min    Activity Tolerance  Patient limited by fatigue    Behavior During Therapy  [facility] for tasks assessed/performed       Past Medical History:  Diagnosis Date  . Arthritis    lower back, hands, knees  . Bipolar disorder ([place])   . Cellulitis and abscess of left leg 11/29/2018  . CHF (congestive heart failure) ([place])   . Depression   . Dyspnea    with exertion  . GERD (gastroesophageal reflux disease)    patient unsure about this dx - no meds  . Headache   . History of IBS    watches diet  . Hyperlipidemia   . Hypertension   . Hypothyroidism   . Lung edema, acute, with congestive heart failure ([place])   . Obese   . Plantar fasciitis   . Seasonal allergies   . Sleep apnea 2017   uses CPAP   . Spinal stenosis of lumbar region   . SVD (spontaneous vaginal delivery)    x 2  . Systemic lupus ([place])   . Thyroid disease     Past Surgical History:  Procedure Laterality Date  . CARPAL TUNNEL RELEASE Left 09/16/2017   Procedure: LEFT CARPAL TUNNEL RELEASE;  Surgeon: Daryll Brod, MD;  Location: [place];  Service: Orthopedics;  Laterality: Left;  . carpel tunnel surgery Right   . CATARACT EXTRACTION Bilateral 2007   w/ lens implants  . COLONOSCOPY    . DILATION AND CURETTAGE OF UTERUS    . EYE SURGERY    . FOOT SURGERY Right    hammer toe  . FRACTURE  SURGERY     R tibia and fibula  . LEG SURGERY Right   . TONSILLECTOMY    . WISDOM TOOTH EXTRACTION      There were no vitals filed for this visit.  Subjective Assessment - 08/28/19 1430    Limitations  Sitting;Lifting;Standing;Walking;House hold activities    How long can you sit comfortably?  any prolonged poistioning causes pain    How long can you stand comfortably?  3-4 minutes before she has too much discomfort    How long can you walk comfortably?  sugnificant dyspnea coming from the lobby to a treatment room    Patient Stated Goals  to be able to walk further more comfortably    Currently in Pain?  Yes    Pain Score  4     Pain Type  Chronic pain    Pain Onset  More than a month ago    Pain Frequency  Intermittent                       [facility] Adult PT Treatment/Exercise - 08/28/19 0001  Lumbar Exercises: Stretches   Other Lumbar Stretch Exercise  seated bal roll x10 5 sec hold lateral ball roll x10 5 sec hold each side; ball press x10 5 sec hold;       Lumbar Exercises: Standing   Other Standing Lumbar Exercises  standing 1/2 tandem stance (limited by habitus ) 30seconds each     Other Standing Lumbar Exercises  standing weightshift 2x10 with seated rest break in between sets       Lumbar Exercises: Seated   Other Seated Lumbar Exercises  scap retraction x20; bilateral ER 2x10 yellow; horizontal abduction yellow 2x10;     Other Seated Lumbar Exercises  yellow band clams shell 2x10; yellow band hamstring curl 2x10;horizontal abduction 2x10 yellow              PT Education - 08/28/19 1432    Education Details  reviewed standing exercises    Person(s) Educated  Patient    Methods  Explanation;Demonstration;Tactile cues;Verbal cues    Comprehension  Returned demonstration;Verbalized understanding;Verbal cues required;Tactile cues required       PT Short Term Goals - 08/28/19 1538      PT SHORT TERM GOAL #1   Title  Patient will be indepdnent  with very basic HEP    Baseline  able to return demonstrate sit<->sidelying without over use of UE in prep for carpal tunnel surgery. Can sit-stand without UEs     Time  3    Period  Weeks    Status  On-going    Target Date  07/25/19      PT SHORT TERM GOAL #2   Title  Patient will ambualte 200' without a seated rest break    Baseline  160'    Time  3    Period  Weeks    Status  On-going    Target Date  08/29/19      PT SHORT TERM GOAL #3   Title  Pt will state 3 pain management strategies or proper posture /body mechanics for household chores    Baseline  Therapy continues to review pain management strategies     Time  3    Period  Weeks    Status  On-going    Target Date  10/09/19      PT SHORT TERM GOAL #4   Title  Pt will be able to stand and walk for 400 feet with =/< 4/10 pain    Baseline  164 ft prior to seated break due to pain on 08/29/2017, unable to assess 09/13/2017 due to pt's joint pain.     Time  4    Period  Weeks    Status  On-going    Target Date  10/09/19      PT SHORT TERM GOAL #5   Title  Pt will tolerate functional testing for long term goal assessment (sit to stand, 2 Min walk test)     Baseline  can perform 10 consecutive sit-stands without UE support - untimed    Time  4    Period  Weeks    Status  On-going        PT Long Term Goals - 08/22/19 1254      PT LONG TERM GOAL #1   Title  Patient will transfer sit to stand witout dyspnea and pain    Baseline  Pt currently not in routine walking program    Time  6    Period  Weeks    Status  On-going      PT LONG TERM GOAL #2   Title  Patient will ambualte 300' without a rest break on 2 min walk test in order to improve community ambualtion    Baseline  8-10 minutes    Time  6    Period  Weeks    Status  On-going      PT LONG TERM GOAL #3   Title  Patient will stand a t home for 20 min without self reported increased lower back pain    Time  6    Period  Weeks    Status  On-going             Plan - 08/28/19 1536    Clinical Impression Statement  Therapy continues to try to advance her standing exercises. She becamse short of breath easily. She tolerated all other ther-ex well. She asked about use of o2 at night. therapy deffered this question to pulmonologist. Therapy will re-assess next visit.    Personal Factors and Comorbidities  Comorbidity 1;Comorbidity 2;Comorbidity 3+    Comorbidities  HTN, OA, plantar facitis, dyspnea, bi-polar    Examination-Activity Limitations  Stand;Stairs;Squat;Sit;Continence;Dressing;Hygiene/Grooming;Lift;Locomotion Level;Bed Mobility;Bend    Examination-Participation Restrictions  Meal Prep;Cleaning;Community Activity;Driving;Shop;Laundry;Yard Work    Stability/Clinical Decision Making  Unstable/Unpredictable    International aid/development worker    Rehab Potential  Poor    PT Frequency  1x / week    PT Duration  6 weeks    PT Treatment/Interventions  ADLs/Self Care Home Management;Cryotherapy;Electrical Stimulation;Iontophoresis 4mg /ml Dexamethasone;Gait training;Moist Heat;DME Instruction;Stair training;Functional mobility training;Therapeutic activities;Therapeutic exercise;Neuromuscular re-education;Manual techniques;Patient/family education;Passive range of motion;Taping    PT Next Visit Plan  continue to advance standing activity; continue to encourage home exercises    PT Home Exercise Plan  seated bilateral ER seated low range horizontal abduction ankle pumps, low range LAQ. Patient advised noty to perfrom until she sees MD. Patient just shown how to do the exercises    Consulted and Agree with Plan of Care  Patient       Patient will benefit from skilled therapeutic intervention in order to improve the following deficits and impairments:  Abnormal gait, Decreased range of motion, Increased fascial restricitons, Increased muscle spasms, Impaired UE functional use, Decreased knowledge of use of DME, Decreased mobility, Decreased  strength, Improper body mechanics, Pain, Impaired perceived functional ability, Decreased safety awareness, Decreased endurance  Visit Diagnosis: Chronic bilateral low back pain without sciatica  Muscle spasm of back  Difficulty in walking, not elsewhere classified  Muscle weakness (generalized)  Radiculopathy, lumbar region     Problem List Patient Active Problem List   Diagnosis Date Noted  . Chronic diastolic heart failure ([place])   . Morbid obesity due to excess calories ([place])   . Bipolar disorder ([place])   . Cellulitis 11/29/2018  . OSA (obstructive sleep apnea) 11/27/2018  . Chronic hypoxemic respiratory failure ([place]) 11/27/2018  . Primary osteoarthritis of both hands 04/19/2017  . Primary osteoarthritis of both knees 04/19/2017  . Primary osteoarthritis of both feet 04/19/2017  . DDD (degenerative disc disease), lumbar 04/19/2017  . History of bipolar disorder 04/19/2017  . Hypothyroidism 06/04/2014  . HTN (hypertension) 01/23/2013  . HLD (hyperlipidemia) 01/23/2013    Carney Living 08/28/2019, 3:43 PM  [facility] [address] [place], [place], [address] Phone: [phone]   Fax:  [phone]  Name: Jessica [name] MRN: [id number] Date of Birth: 01/30/1951

## 2019-08-29 ENCOUNTER — Telehealth: Payer: Self-pay | Admitting: Pulmonary Disease

## 2019-08-29 NOTE — Telephone Encounter (Signed)
Spoke with the pt  She states that she has been using her CPAP with o2 with sleep  She is unsure how many liters of o2 she is on  She is asking if she needs to continue this and for how long  Her BIPAP titration with Dr Dwyane Dee has been rescheduled 3 x already and is now scheduled for June 2021  Please advise thanks

## 2019-08-31 NOTE — Telephone Encounter (Signed)
She should continue oxygen until he has her sleep study completed with her sleep physician.  Rodman Pickle, M.D. [facility] Pulmonary/Critical Care Medicine 08/31/2019 1:09 PM

## 2019-08-31 NOTE — Telephone Encounter (Signed)
Called and spoke with pt letting her know the info stated by Dr. Ellison and she verbalized understanding. Nothing further needed. 

## 2019-09-04 ENCOUNTER — Encounter: Payer: Medicare Other | Admitting: Physical Therapy

## 2019-09-11 ENCOUNTER — Ambulatory Visit: Payer: Medicare Other | Admitting: Psychiatry

## 2019-09-13 ENCOUNTER — Ambulatory Visit: Payer: Medicare Other | Attending: Family Medicine | Admitting: Physical Therapy

## 2019-09-13 DIAGNOSIS — M545 Low back pain: Secondary | ICD-10-CM | POA: Insufficient documentation

## 2019-09-13 DIAGNOSIS — M6281 Muscle weakness (generalized): Secondary | ICD-10-CM | POA: Insufficient documentation

## 2019-09-13 DIAGNOSIS — R262 Difficulty in walking, not elsewhere classified: Secondary | ICD-10-CM | POA: Insufficient documentation

## 2019-09-13 DIAGNOSIS — G8929 Other chronic pain: Secondary | ICD-10-CM | POA: Insufficient documentation

## 2019-09-13 DIAGNOSIS — M62830 Muscle spasm of back: Secondary | ICD-10-CM | POA: Insufficient documentation

## 2019-09-16 NOTE — Progress Notes (Signed)
Cardiology Office Note:   Date:  09/17/2019  NAME:  Jessica [name]    MRN: [id number] DOB:  11/14/1950   PCP:  Alroy Dust, L.Marlou Sa, MD  Cardiologist:  Evalina Field, MD   Referring MD: Aurea Graff.Marlou Sa, MD   Chief Complaint  Patient presents with  . Congestive Heart Failure   History of Present Illness:   Jessica [name] is a 69 y.o. female with a hx of diastolic heart failure, RV failure, morbid obesity, hypertension who presents for follow-up of heart failure.  She reports she is been doing fairly well.  She is more active.  She is working in the garden and able to do more more activity each day.  Her weights have gone up from 297 pounds at 301 pounds.  She is watching the salt intake but does have significant lower extremity edema.  She is on torsemide 20 mg daily.  She reports that when she doubles up she does ride a medications early and her insurance company did not fill them.  This is unfortunate.  She reports walking distances in her house as well as doing outside activity gets her quite short of breath.  She denies any chest pain or chest pressure.  She is being reevaluated for BiPAP therapy.  Also her need for nocturnal oxygen is being evaluated as well.  She recently went to [facility] for vacation.  She did enjoy it.  Her husband is not with her today.  He is waiting in the car.  Overall symptoms are stable.  Weight gain is concerning.  She is reducing her salt intake.  She denies chest pain, shortness of breath or palpitations today.  EKG unchanged from prior.  Problem List: 1. HFpEF -60-65%, G1DD 2. RV Failure 2/2 OSA/OHS 3. Morbid Obesity (BMI 53) 4. HTN  Past Medical History: Past Medical History:  Diagnosis Date  . Arthritis    lower back, hands, knees  . Bipolar disorder ([place])   . Cellulitis and abscess of left leg 11/29/2018  . CHF (congestive heart failure) ([place])   . Depression   . Dyspnea    with exertion  . GERD (gastroesophageal  reflux disease)    patient unsure about this dx - no meds  . Headache   . History of IBS    watches diet  . Hyperlipidemia   . Hypertension   . Hypothyroidism   . Lung edema, acute, with congestive heart failure ([place])   . Obese   . Plantar fasciitis   . Seasonal allergies   . Sleep apnea 2017   uses CPAP   . Spinal stenosis of lumbar region   . SVD (spontaneous vaginal delivery)    x 2  . Systemic lupus ([place])   . Thyroid disease     Past Surgical History: Past Surgical History:  Procedure Laterality Date  . CARPAL TUNNEL RELEASE Left 09/16/2017   Procedure: LEFT CARPAL TUNNEL RELEASE;  Surgeon: Daryll Brod, MD;  Location: [facility];  Service: Orthopedics;  Laterality: Left;  . carpel tunnel surgery Right   . CATARACT EXTRACTION Bilateral 2007   w/ lens implants  . COLONOSCOPY    . DILATION AND CURETTAGE OF UTERUS    . EYE SURGERY    . FOOT SURGERY Right    hammer toe  . FRACTURE SURGERY     R tibia and fibula  . LEG SURGERY Right   . TONSILLECTOMY    . WISDOM TOOTH EXTRACTION  Current Medications: Current Meds  Medication Sig  . acetaminophen (TYLENOL) 650 MG CR tablet Take 650 mg by mouth daily.   . Acetylcysteine 600 MG CAPS Take 600 mg by mouth 2 (two) times daily. NAC  . amLODipine (NORVASC) 10 MG tablet Take 1 tablet (10 mg total) by mouth daily.  . Carboxymeth-Glycerin-Polysorb (REFRESH OPTIVE ADVANCED OP) Apply to eye.  . cycloSPORINE (RESTASIS) 0.05 % ophthalmic emulsion Place 1 drop into both eyes 2 (two) times daily.  . DULoxetine (CYMBALTA) 60 MG capsule TAKE 1 CAPSULE BY MOUTH EVERY DAY **DISCONTINUE SERTRALINE**  . gabapentin (NEURONTIN) 300 MG capsule Take 1 capsule (300 mg total) by mouth at bedtime. (Patient taking differently: Take 1,200 mg by mouth daily. TAKE 1 TABLET IN THE MORNING AND 3 TABLETS AT NIGHT.)  . levothyroxine (SYNTHROID) 25 MCG tablet Take 25 mcg by mouth daily before breakfast.   . lisdexamfetamine (VYVANSE) 60 MG capsule Take 1  capsule (60 mg total) by mouth every morning.  Marland Kitchen LORazepam (ATIVAN) 0.5 MG tablet Take 1 tablet (0.5 mg total) by mouth 2 (two) times daily as needed for anxiety.  . metoprolol tartrate (LOPRESSOR) 25 MG tablet Take 0.5 tablets (12.5 mg total) by mouth 2 (two) times daily.  . Multiple Vitamins-Minerals (MULTIVITAMIN WITH MINERALS) tablet Take 1 tablet by mouth daily.  . Multiple Vitamins-Minerals (PRESERVISION AREDS 2) CAPS Take 1 capsule 2 (two) times daily by mouth.  . potassium chloride SA (KLOR-CON) 20 MEQ tablet Take 1 tablet by mouth every other day on days Torsemide is taken  . pravastatin (PRAVACHOL) 20 MG tablet Take 20 mg at bedtime by mouth.   . SYNTHROID 200 MCG tablet TAKE 1 TABLET BY MOUTH EVERY MORNING ON AN EMPTY STOMACH  . Thiamine HCl (VITAMIN B1) 100 MG TABS Take 1 tablet by mouth daily.  Marland Kitchen torsemide (DEMADEX) 20 MG tablet Take 20 mg daily  . triamcinolone cream (KENALOG) 0.5 % APPLY SPARINGLY EXTERNALLY TO THE AFFECTED AREA TWICE A DAY  . vitamin B-12 (CYANOCOBALAMIN) 100 MCG tablet Take 100 mcg by mouth 2 (two) times daily.  . Vitamin D, Ergocalciferol, (DRISDOL) 1.25 MG (50000 UNIT) CAPS capsule Take 1 capsule (50,000 Units total) by mouth every 7 (seven) days.  . [DISCONTINUED] torsemide (DEMADEX) 20 MG tablet Take 20 mg daily     Allergies:    Ibuprofen, Codeine, Nabumetone, Other, Promethazine, Amoxicillin-pot clavulanate, Erythromycin base, and Promethazine-codeine   Social History: Social History   Socioeconomic History  . Marital status: Married    Spouse name: Not on file  . Number of children: Not on file  . Years of education: Not on file  . Highest education level: Not on file  Occupational History  . Not on file  Tobacco Use  . Smoking status: Never Smoker  . Smokeless tobacco: Never Used  Substance and Sexual Activity  . Alcohol use: Not Currently  . Drug use: Never  . Sexual activity: Not on file  Other Topics Concern  . Not on file  Social  History Narrative   She lives in a single level home with her husband.  She has two grown children.   She taught college accounting courses, retired in 2005.    Highest level of education:  Designer, jewellery in [employer]   Social Determinants of Health   Financial Resource Strain:   . Difficulty of Paying Living Expenses:   Food Insecurity:   . Worried About Charity fundraiser in the Last Year:   . [employer] of  Food in the Last Year:   Transportation Needs:   . Film/video editor (Medical):   Marland Kitchen Lack of Transportation (Non-Medical):   Physical Activity:   . Days of Exercise per Week:   . Minutes of Exercise per Session:   Stress:   . Feeling of Stress :   Social Connections:   . Frequency of Communication with Friends and Family:   . Frequency of Social Gatherings with Friends and Family:   . Attends Religious Services:   . Active Member of Clubs or Organizations:   . Attends Archivist Meetings:   Marland Kitchen Marital Status:      Family History: The patient's family history includes Bipolar disorder in her father; Breast cancer in her mother; Dementia in her father; Diabetes in her father; Heart disease in her father and mother; Thyroid disease in her mother.  ROS:   All other ROS reviewed and negative. Pertinent positives noted in the HPI.     EKGs/Labs/Other Studies Reviewed:   The following studies were personally reviewed by me today:  An EKG was ordered and personally reviewed by myself.  That EKG demonstrates normal sinus rhythm, heart rate 86, no acute ST-T changes, poor R wave progression noted.  TTE 12/02/2018 1. The left ventricle has normal systolic function with an ejection  fraction of 60-65%. The cavity size was normal. There is mild concentric  left ventricular hypertrophy. Left ventricular diastolic Doppler  parameters are consistent with impaired  relaxation. Indeterminate filling pressures.  2. The right ventricle has mildly reduced systolic function. The  cavity  was mildly enlarged. There is no increase in right ventricular wall  thickness.  3. The aortic valve is tricuspid. Moderate aortic annular calcification  noted.  4. The mitral valve is grossly normal. There is mild mitral annular  calcification present.  5. The tricuspid valve is grossly normal.  6. The aorta is normal in size and structure.   Recent Labs: 12/10/2018: Magnesium 2.5 12/11/2018: Hemoglobin 14.4; Platelets 337 04/16/2019: ALT 17 07/16/2019: BNP 13.1; BUN 26; Creatinine, Ser 0.90; Potassium 5.0; Sodium 139   Recent Lipid Panel    Component Value Date/Time   CHOL 173 01/23/2013 1105   TRIG 271 (H) 01/23/2013 1105   HDL 51 01/23/2013 1105   CHOLHDL 3.4 01/23/2013 1105   VLDL 54 (H) 01/23/2013 1105   LDLCALC 68 01/23/2013 1105    Physical Exam:   VS:  BP 132/76   Pulse 86   Temp (!) 96.4 F (35.8 C)   Ht 5\' 2"  (1.575 m)   Wt (!) 301 lb 3.2 oz (136.6 kg)   SpO2 95%   BMI 55.09 kg/m    Wt Readings from Last 3 Encounters:  09/17/19 (!) 301 lb 3.2 oz (136.6 kg)  07/25/19 297 lb (134.7 kg)  07/16/19 295 lb (133.8 kg)    General: Well nourished, well developed, in no acute distress Heart: Atraumatic, normal size  Eyes: PEERLA, EOMI  Neck: Supple, no JVD Endocrine: No thryomegaly Cardiac: Normal S1, S2; RRR; no murmurs, rubs, or gallops Lungs: Clear to auscultation bilaterally, no wheezing, rhonchi or rales  Abd: Soft, nontender, no hepatomegaly  Ext: 2+ pitting edema up to mid shins, chronic venous insufficiency changes noted Musculoskeletal: No deformities, BUE and BLE strength normal and equal Skin: Warm and dry, no rashes   Neuro: Alert and oriented to person, place, time, and situation, CNII-XII grossly intact, no focal deficits  Psych: Normal mood and affect   ASSESSMENT:   Stanton Kidney  Jessica [name] is a 69 y.o. female who presents for the following: 1. Chronic diastolic heart failure ([place])   2. RVF (right ventricular failure) ([facility])   3.  Obesity, morbid, BMI 50 or higher ([place])   4. Essential hypertension     PLAN:   1. Chronic diastolic heart failure ([place]) 2. RVF (right ventricular failure) ([facility]) 3. Obesity, morbid, BMI 50 or higher ([place]) 4. Essential hypertension -Seems to be a bit volume up today.  Weights have increased from 297-301 on her last visit.  We will increase her torsemide to 40 mg daily for 3 days and then she will resume 20 mg daily.  She will continue to work on salt intake restriction.  She will continue to remain more active.  She seems to be doing overall well.  Have also encouraged her to maintain treatment for sleep apnea.  Blood pressure is well controlled today.  EKG unchanged.  We will see her back in 3 months.    Disposition: Return in about 3 months (around 12/18/2019).  Medication Adjustments/Labs and Tests Ordered: Current medicines are reviewed at length with the patient today.  Concerns regarding medicines are outlined above.  Orders Placed This Encounter  Procedures  . EKG 12-Lead   Meds ordered this encounter  Medications  . torsemide (DEMADEX) 20 MG tablet    Sig: Take 20 mg daily    Dispense:  36 tablet    Refill:  3    Patient Instructions  Medication Instructions:  Take Torsemide 40 mg for 3 days, then back to 20 mg daily-  May use periodically if needed.   *If you need a refill on your cardiac medications before your next appointment, please call your pharmacy*   Follow-Up: At [facility], you and your health needs are our priority.  As part of our continuing mission to provide you with exceptional heart care, we have created designated Provider Care Teams.  These Care Teams include your primary Cardiologist (physician) and Advanced Practice Providers (APPs -  Physician Assistants and Nurse Practitioners) who all work together to provide you with the care you need, when you need it.  We recommend signing up for the patient portal called "MyChart".  Sign up information is  provided on this After Visit Summary.  MyChart is used to connect with patients for Virtual Visits (Telemedicine).  Patients are able to view lab/test results, encounter notes, upcoming appointments, etc.  Non-urgent messages can be sent to your provider as well.   To learn more about what you can do with MyChart, go to [email or web address].    Your next appointment:   3 month(s)  The format for your next appointment:   In Person  Provider:   Eleonore Chiquito, MD         Time Spent with Patient: I have spent a total of 25 minutes with patient reviewing hospital notes, telemetry, EKGs, labs and examining the patient as well as establishing an assessment and plan that was discussed with the patient.  > 50% of time was spent in direct patient care.  Signed, Addison Naegeli. Audie Box, [place]  [address] [place], [place] [address] [phone]  09/17/2019 4:06 PM

## 2019-09-17 ENCOUNTER — Ambulatory Visit (INDEPENDENT_AMBULATORY_CARE_PROVIDER_SITE_OTHER): Payer: Medicare Other | Admitting: Cardiovascular Disease

## 2019-09-17 ENCOUNTER — Other Ambulatory Visit: Payer: Self-pay

## 2019-09-17 ENCOUNTER — Encounter: Payer: Self-pay | Admitting: Cardiovascular Disease

## 2019-09-17 VITALS — BP 132/76 | HR 86 | Temp 96.4°F | Ht 62.0 in | Wt 301.2 lb

## 2019-09-17 DIAGNOSIS — I5032 Chronic diastolic (congestive) heart failure: Secondary | ICD-10-CM

## 2019-09-17 DIAGNOSIS — I50810 Right heart failure, unspecified: Secondary | ICD-10-CM

## 2019-09-17 DIAGNOSIS — I10 Essential (primary) hypertension: Secondary | ICD-10-CM

## 2019-09-17 MED ORDER — TORSEMIDE 20 MG PO TABS: ORAL_TABLET | ORAL | 3 refills | Status: DC

## 2019-09-17 NOTE — Patient Instructions (Signed)
Medication Instructions:  Take Torsemide 40 mg for 3 days, then back to 20 mg daily-  May use periodically if needed.   *If you need a refill on your cardiac medications before your next appointment, please call your pharmacy*   Follow-Up: At [facility], you and your health needs are our priority.  As part of our continuing mission to provide you with exceptional heart care, we have created designated Provider Care Teams.  These Care Teams include your primary Cardiologist (physician) and Advanced Practice Providers (APPs -  Physician Assistants and Nurse Practitioners) who all work together to provide you with the care you need, when you need it.  We recommend signing up for the patient portal called "MyChart".  Sign up information is provided on this After Visit Summary.  MyChart is used to connect with patients for Virtual Visits (Telemedicine).  Patients are able to view lab/test results, encounter notes, upcoming appointments, etc.  Non-urgent messages can be sent to your provider as well.   To learn more about what you can do with MyChart, go to [email or web address].    Your next appointment:   3 month(s)  The format for your next appointment:   In Person  Provider:   Eleonore Chiquito, MD

## 2019-09-20 ENCOUNTER — Ambulatory Visit
Admission: RE | Admit: 2019-09-20 | Discharge: 2019-09-20 | Disposition: A | Discharge status: Home / Self Care | Payer: Medicare Other | Source: Ambulatory Visit | Attending: Orthopaedic Surgery | Admitting: Orthopaedic Surgery

## 2019-09-20 ENCOUNTER — Other Ambulatory Visit: Payer: Medicare Other

## 2019-09-20 DIAGNOSIS — M4312 Spondylolisthesis, cervical region: Secondary | ICD-10-CM

## 2019-09-20 DIAGNOSIS — M4802 Spinal stenosis, cervical region: Secondary | ICD-10-CM | POA: Diagnosis not present

## 2019-09-21 DIAGNOSIS — H11423 Conjunctival edema, bilateral: Secondary | ICD-10-CM | POA: Diagnosis not present

## 2019-09-21 DIAGNOSIS — H16223 Keratoconjunctivitis sicca, not specified as Sjogren's, bilateral: Secondary | ICD-10-CM | POA: Diagnosis not present

## 2019-09-21 DIAGNOSIS — H11823 Conjunctivochalasis, bilateral: Secondary | ICD-10-CM | POA: Diagnosis not present

## 2019-09-21 DIAGNOSIS — B356 Tinea cruris: Secondary | ICD-10-CM | POA: Diagnosis not present

## 2019-09-21 DIAGNOSIS — H04123 Dry eye syndrome of bilateral lacrimal glands: Secondary | ICD-10-CM | POA: Diagnosis not present

## 2019-09-21 DIAGNOSIS — J309 Allergic rhinitis, unspecified: Secondary | ICD-10-CM | POA: Diagnosis not present

## 2019-09-26 ENCOUNTER — Other Ambulatory Visit: Payer: Self-pay

## 2019-09-26 ENCOUNTER — Ambulatory Visit: Payer: Medicare Other | Admitting: Physical Therapy

## 2019-09-26 DIAGNOSIS — M545 Low back pain, unspecified: Secondary | ICD-10-CM

## 2019-09-26 DIAGNOSIS — G8929 Other chronic pain: Secondary | ICD-10-CM | POA: Diagnosis not present

## 2019-09-26 DIAGNOSIS — M62830 Muscle spasm of back: Secondary | ICD-10-CM

## 2019-09-26 DIAGNOSIS — R262 Difficulty in walking, not elsewhere classified: Secondary | ICD-10-CM

## 2019-09-26 DIAGNOSIS — M6281 Muscle weakness (generalized): Secondary | ICD-10-CM | POA: Diagnosis not present

## 2019-09-27 ENCOUNTER — Encounter: Payer: Self-pay | Admitting: Physical Therapy

## 2019-09-27 DIAGNOSIS — M5030 Other cervical disc degeneration, unspecified cervical region: Secondary | ICD-10-CM | POA: Diagnosis not present

## 2019-09-27 DIAGNOSIS — M545 Low back pain: Secondary | ICD-10-CM | POA: Diagnosis not present

## 2019-09-27 DIAGNOSIS — M47892 Other spondylosis, cervical region: Secondary | ICD-10-CM | POA: Diagnosis not present

## 2019-09-27 NOTE — Therapy (Signed)
[place], [place], [address] Phone: [phone]   Fax:  [phone]  Physical Therapy Treatment  Patient Details  Name: Jessica [name] MRN: [id number] Date of Birth: 1950/09/06 Referring Provider (PT): Donnie Coffin MD   Progress Note Reporting Period 05/21/2019 to 09/27/2019  See note below for Objective Data and Assessment of Progress/Goals.      Encounter Date: 09/26/2019  PT End of Session - 09/26/19 1429    Visit Number  9    Number of Visits  15    Date for PT Re-Evaluation  11/08/19    Authorization Type  medicare progress note after 19 visits    PT Start Time  [id number]    PT Stop Time  1500    PT Time Calculation (min)  40 min    Activity Tolerance  Patient limited by fatigue    Behavior During Therapy  [facility] for tasks assessed/performed       Past Medical History:  Diagnosis Date  . Arthritis    lower back, hands, knees  . Bipolar disorder ([place])   . Cellulitis and abscess of left leg 11/29/2018  . CHF (congestive heart failure) ([place])   . Depression   . Dyspnea    with exertion  . GERD (gastroesophageal reflux disease)    patient unsure about this dx - no meds  . Headache   . History of IBS    watches diet  . Hyperlipidemia   . Hypertension   . Hypothyroidism   . Lung edema, acute, with congestive heart failure ([place])   . Obese   . Plantar fasciitis   . Seasonal allergies   . Sleep apnea 2017   uses CPAP   . Spinal stenosis of lumbar region   . SVD (spontaneous vaginal delivery)    x 2  . Systemic lupus ([place])   . Thyroid disease     Past Surgical History:  Procedure Laterality Date  . CARPAL TUNNEL RELEASE Left 09/16/2017   Procedure: LEFT CARPAL TUNNEL RELEASE;  Surgeon: Daryll Brod, MD;  Location: [place];  Service: Orthopedics;  Laterality: Left;  . carpel tunnel surgery Right   . CATARACT EXTRACTION Bilateral 2007   w/ lens implants  . COLONOSCOPY    . DILATION  AND CURETTAGE OF UTERUS    . EYE SURGERY    . FOOT SURGERY Right    hammer toe  . FRACTURE SURGERY     R tibia and fibula  . LEG SURGERY Right   . TONSILLECTOMY    . WISDOM TOOTH EXTRACTION      There were no vitals filed for this visit.  Subjective Assessment - 09/27/19 1453    Subjective  Patient reports that she has been able to stand more. Her back hasnot been much aof a problem it has mostly been her knees.    Limitations  Sitting;Lifting;Standing;Walking;House hold activities    How long can you sit comfortably?  any prolonged poistioning causes pain    How long can you stand comfortably?  3-4 minutes before she has too much discomfort    How long can you walk comfortably?  sugnificant dyspnea coming from the lobby to a treatment room    Patient Stated Goals  to be able to walk further more comfortably    Currently in Pain?  Yes    Pain Score  6     Pain Location  Knee    Pain Orientation  Right;Left  Pain Descriptors / Indicators  Aching    Pain Type  Chronic pain    Pain Onset  More than a month ago    Pain Frequency  Intermittent    Aggravating Factors   standing and walking    Pain Relieving Factors  changing positions    Effect of Pain on Daily Activities  pain with activity         [facility] PT Assessment - 09/27/19 0001      Strength   Right Hip Flexion  4+/5    Right Hip ABduction  4/5    Right Hip ADduction  4+/5    Left Hip Flexion  4+/5    Left Hip ABduction  4+/5    Left Hip ADduction  4+/5    Right Knee Flexion  4/5    Right Knee Extension  4/5    Left Knee Flexion  4/5    Left Knee Extension  4/5      Transfers   Comments  continues to do better with sit to stand transfers. Less UE use and improved weight shit; patient was also able to lie supine with only min a and min a to sit up. She has not been able to do this in the past       Ambulation/Gait   Gait Comments  improved ability to stand straight                    [facility]  PT Treatment/Exercise - 09/27/19 0001      Self-Care   Other Self-Care Comments   reviewed potential strategies for getting in and out of a van       Lumbar Exercises: Stretches   Other Lumbar Stretch Exercise  seated bal roll x10 5 sec hold lateral ball roll x10 5 sec hold each side; ball press x10 5 sec hold;       Lumbar Exercises: Seated   Other Seated Lumbar Exercises  scap retraction x20; bilateral ER 2x10 yellow; horizontal abduction yellow 2x10;     Other Seated Lumbar Exercises  red band clams shell 2x10; red band hamstring curl 2x10;horizontal abduction 2x10 yellow       Manual Therapy   Manual therapy comments  LAD 5x30 sec hold bilateral  sec hold bilateral; passive ROM of bilateral hips as tolerated and habitus will allow.              PT Education - 09/27/19 1456    Education Details  reviewed HEp and symptom management    Person(s) Educated  Patient    Methods  Demonstration;Tactile cues;Explanation;Verbal cues    Comprehension  Verbalized understanding;Returned demonstration;Verbal cues required;Tactile cues required       PT Short Term Goals - 08/28/19 1538      PT SHORT TERM GOAL #1   Title  Patient will be indepdnent with very basic HEP    Baseline  able to return demonstrate sit<->sidelying without over use of UE in prep for carpal tunnel surgery. Can sit-stand without UEs     Time  3    Period  Weeks    Status  On-going    Target Date  07/25/19      PT SHORT TERM GOAL #2   Title  Patient will ambualte 200' without a seated rest break    Baseline  160'    Time  3    Period  Weeks    Status  On-going    Target Date  08/29/19      PT SHORT TERM GOAL #3   Title  Pt will state 3 pain management strategies or proper posture /body mechanics for household chores    Baseline  Therapy continues to review pain management strategies     Time  3    Period  Weeks    Status  On-going    Target Date  10/09/19      PT SHORT TERM GOAL #4   Title  Pt will  be able to stand and walk for 400 feet with =/< 4/10 pain    Baseline  164 ft prior to seated break due to pain on 08/29/2017, unable to assess 09/13/2017 due to pt's joint pain.     Time  4    Period  Weeks    Status  On-going    Target Date  10/09/19      PT SHORT TERM GOAL #5   Title  Pt will tolerate functional testing for long term goal assessment (sit to stand, 2 Min walk test)     Baseline  can perform 10 consecutive sit-stands without UE support - untimed    Time  4    Period  Weeks    Status  On-going        PT Long Term Goals - 08/22/19 1254      PT LONG TERM GOAL #1   Title  Patient will transfer sit to stand witout dyspnea and pain    Baseline  Pt currently not in routine walking program    Time  6    Period  Weeks    Status  On-going      PT LONG TERM GOAL #2   Title  Patient will ambualte 300' without a rest break on 2 min walk test in order to improve community ambualtion    Baseline  8-10 minutes    Time  6    Period  Weeks    Status  On-going      PT LONG TERM GOAL #3   Title  Patient will stand a t home for 20 min without self reported increased lower back pain    Time  6    Period  Weeks    Status  On-going            Plan - 09/27/19 1457    Clinical Impression Statement  Patient is making some progress. She has been able to come consitently and has been able to complete her exercises on more of a consitent basis. She is sytill limited by hand and knee pain. She was able to liel in a semi-reccumbent position this visit which allowed for manual therapy for the first time. At this time she is having trouble stepping into her [employer] Lei. She would benefit from further skilled therapy 1 W6 to see if therapy can advance patients ability to step into a van through [place] and hip decompression. She tolerated thera-ex well today athough her UE exercises were limited by her hands. Therapy spoke to her about transfers into her new [employer] and talked about potential  strategies. If she gets her new [employer] Lei we will also work on that with her.    Personal Factors and Comorbidities  Comorbidity 1;Comorbidity 2;Comorbidity 3+    Comorbidities  HTN, OA, plantar facitis, dyspnea, bi-polar    Examination-Activity Limitations  Stand;Stairs;Squat;Sit;Continence;Dressing;Hygiene/Grooming;Lift;Locomotion Level;Bed Mobility;Bend    Examination-Participation Restrictions  Meal Prep;Cleaning;Community Activity;Driving;Shop;Laundry;Yard Work    [employer]  Unstable/Unpredictable  Clinical Decision Making  High    Rehab Potential  Poor    PT Frequency  1x / week    PT Duration  6 weeks    PT Treatment/Interventions  ADLs/Self Care Home Management;Cryotherapy;Electrical Stimulation;Iontophoresis 4mg /ml Dexamethasone;Gait training;Moist Heat;DME Instruction;Stair training;Functional mobility training;Therapeutic activities;Therapeutic exercise;Neuromuscular re-education;Manual techniques;Patient/family education;Passive range of motion;Taping    PT Next Visit Plan  continue to advance standing activity; continue to encourage home exercises    PT Home Exercise Plan  seated bilateral ER seated low range horizontal abduction ankle pumps, low range LAQ. Patient advised noty to perfrom until she sees MD. Patient just shown how to do the exercises    Consulted and Agree with Plan of Care  Patient       Patient will benefit from skilled therapeutic intervention in order to improve the following deficits and impairments:  Abnormal gait, Decreased range of motion, Increased fascial restricitons, Increased muscle spasms, Impaired UE functional use, Decreased knowledge of use of DME, Decreased mobility, Decreased strength, Improper body mechanics, Pain, Impaired perceived functional ability, Decreased safety awareness, Decreased endurance  Visit Diagnosis: Chronic bilateral low back pain without sciatica  Muscle spasm of back  Difficulty in walking, not  elsewhere classified  Muscle weakness (generalized)     Problem List Patient Active Problem List   Diagnosis Date Noted  . Chronic diastolic heart failure ([place])   . Morbid obesity due to excess calories ([place])   . Bipolar disorder ([place])   . Cellulitis 11/29/2018  . OSA (obstructive sleep apnea) 11/27/2018  . Chronic hypoxemic respiratory failure ([place]) 11/27/2018  . Primary osteoarthritis of both hands 04/19/2017  . Primary osteoarthritis of both knees 04/19/2017  . Primary osteoarthritis of both feet 04/19/2017  . DDD (degenerative disc disease), lumbar 04/19/2017  . History of bipolar disorder 04/19/2017  . Hypothyroidism 06/04/2014  . HTN (hypertension) 01/23/2013  . HLD (hyperlipidemia) 01/23/2013    Carney Living PT DPT  09/27/2019, 4:39 PM  [facility] [address] [place], [place], [address] Phone: [phone]   Fax:  [phone]  Name: Jessica [name] MRN: [id number] Date of Birth: May 26, 1950

## 2019-10-03 ENCOUNTER — Ambulatory Visit: Payer: Medicare Other | Admitting: Physical Therapy

## 2019-10-09 ENCOUNTER — Encounter: Payer: Self-pay | Admitting: Psychiatry

## 2019-10-09 ENCOUNTER — Ambulatory Visit (INDEPENDENT_AMBULATORY_CARE_PROVIDER_SITE_OTHER): Payer: Medicare Other | Admitting: Psychiatry

## 2019-10-09 ENCOUNTER — Other Ambulatory Visit: Payer: Self-pay

## 2019-10-09 DIAGNOSIS — G4733 Obstructive sleep apnea (adult) (pediatric): Secondary | ICD-10-CM

## 2019-10-09 DIAGNOSIS — F411 Generalized anxiety disorder: Secondary | ICD-10-CM | POA: Diagnosis not present

## 2019-10-09 DIAGNOSIS — F900 Attention-deficit hyperactivity disorder, predominantly inattentive type: Secondary | ICD-10-CM

## 2019-10-09 DIAGNOSIS — F319 Bipolar disorder, unspecified: Secondary | ICD-10-CM | POA: Diagnosis not present

## 2019-10-09 MED ORDER — GABAPENTIN 300 MG PO CAPS: ORAL_CAPSULE | ORAL | 1 refills | Status: DC

## 2019-10-09 MED ORDER — LITHIUM CARBONATE 150 MG PO CAPS: 750.0000 mg | ORAL_CAPSULE | Freq: Every day | ORAL | 0 refills | Status: DC

## 2019-10-09 MED ORDER — LISDEXAMFETAMINE DIMESYLATE 40 MG PO CAPS: 40.0000 mg | ORAL_CAPSULE | ORAL | 0 refills | Status: DC

## 2019-10-09 MED ORDER — DULOXETINE HCL 60 MG PO CPEP: 60.0000 mg | ORAL_CAPSULE | Freq: Every day | ORAL | 0 refills | Status: DC
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
## 2019-10-12 ENCOUNTER — Telehealth: Payer: Self-pay | Admitting: Psychiatry

## 2019-10-12 ENCOUNTER — Other Ambulatory Visit: Payer: Self-pay | Admitting: Psychiatry

## 2019-10-12 DIAGNOSIS — F900 Attention-deficit hyperactivity disorder, predominantly inattentive type: Secondary | ICD-10-CM

## 2019-10-12 MED ORDER — LISDEXAMFETAMINE DIMESYLATE 60 MG PO CAPS: 60.0000 mg | ORAL_CAPSULE | ORAL | 0 refills | Status: DC

## 2019-10-12 NOTE — Telephone Encounter (Signed)
Jessica [name] called because you discussed with her at last appt. To decrease Vyvanse to 40mg .  Because she is going on a trip she has decided to stay at 60mg  for the next month.  Please cancel the prescription for 40mg  and send in a new prescription for 60mg .  [facility] [address]  Please let her know when this has been done.

## 2019-10-12 NOTE — Telephone Encounter (Signed)
Prescription sent for Vyvanse 60 mg

## 2019-10-13 LAB — LITHIUM LEVEL: Lithium Lvl: 0.8 mmol/L (ref 0.6–1.2)

## 2019-10-15 ENCOUNTER — Other Ambulatory Visit: Payer: Self-pay

## 2019-10-15 ENCOUNTER — Ambulatory Visit: Payer: Medicare Other | Attending: Family Medicine | Admitting: Physical Therapy

## 2019-10-15 DIAGNOSIS — M6281 Muscle weakness (generalized): Secondary | ICD-10-CM | POA: Insufficient documentation

## 2019-10-15 DIAGNOSIS — M62830 Muscle spasm of back: Secondary | ICD-10-CM | POA: Insufficient documentation

## 2019-10-15 DIAGNOSIS — M545 Low back pain: Secondary | ICD-10-CM | POA: Diagnosis present

## 2019-10-15 DIAGNOSIS — R262 Difficulty in walking, not elsewhere classified: Secondary | ICD-10-CM | POA: Diagnosis present

## 2019-10-15 DIAGNOSIS — M5416 Radiculopathy, lumbar region: Secondary | ICD-10-CM

## 2019-10-15 DIAGNOSIS — G8929 Other chronic pain: Secondary | ICD-10-CM | POA: Diagnosis present

## 2019-10-15 NOTE — Progress Notes (Signed)
Lithium level is moving back towards normal at 0.8 on 600 mg daily.  The prescription is written for 750 mg daily but I believe she is only taking 600 mg daily. No change indicated. Please let her know.

## 2019-10-16 ENCOUNTER — Encounter: Payer: Self-pay | Admitting: Physical Therapy

## 2019-10-16 NOTE — Therapy (Signed)
[place], [place], [address] Phone: [phone]   Fax:  [phone]  Physical Therapy Treatment  Patient Details  Name: Jessica [name] MRN: [id number] Date of Birth: 01-01-1951 Referring Provider (PT): Donnie Coffin MD    Encounter Date: 10/15/2019   PT End of Session - 10/16/19 0655    Visit Number 10    Number of Visits 15    Date for PT Re-Evaluation 11/08/19    Authorization Type medicare progress note after 19 visits    PT Start Time 1512   Patient 5 minutes late then had to use the rest rrom   PT Stop Time 1545    PT Time Calculation (min) 33 min    Activity Tolerance Patient limited by fatigue    Behavior During Therapy [facility] for tasks assessed/performed           Past Medical History:  Diagnosis Date  . Arthritis    lower back, hands, knees  . Bipolar disorder ([place])   . Cellulitis and abscess of left leg 11/29/2018  . CHF (congestive heart failure) ([place])   . Depression   . Dyspnea    with exertion  . GERD (gastroesophageal reflux disease)    patient unsure about this dx - no meds  . Headache   . History of IBS    watches diet  . Hyperlipidemia   . Hypertension   . Hypothyroidism   . Lung edema, acute, with congestive heart failure ([place])   . Obese   . Plantar fasciitis   . Seasonal allergies   . Sleep apnea 2017   uses CPAP   . Spinal stenosis of lumbar region   . SVD (spontaneous vaginal delivery)    x 2  . Systemic lupus ([place])   . Thyroid disease     Past Surgical History:  Procedure Laterality Date  . CARPAL TUNNEL RELEASE Left 09/16/2017   Procedure: LEFT CARPAL TUNNEL RELEASE;  Surgeon: Daryll Brod, MD;  Location: [facility];  Service: Orthopedics;  Laterality: Left;  . carpel tunnel surgery Right   . CATARACT EXTRACTION Bilateral 2007   w/ lens implants  . COLONOSCOPY    . DILATION AND CURETTAGE OF UTERUS    . EYE SURGERY    . FOOT SURGERY Right    hammer toe    . FRACTURE SURGERY     R tibia and fibula  . LEG SURGERY Right   . TONSILLECTOMY    . WISDOM TOOTH EXTRACTION      There were no vitals filed for this visit.   Subjective Assessment - 10/16/19 0653    Subjective Patient had some complaications with her medication today that have made her feel bad. She has since taken the medication and is feeling better. Overall her back is doing fair. If she is easy on it it does not hurt as bad.    Limitations Sitting;Lifting;Standing;Walking;House hold activities    How long can you sit comfortably? any prolonged poistioning causes pain    How long can you stand comfortably? 3-4 minutes before she has too much discomfort    How long can you walk comfortably? sugnificant dyspnea coming from the lobby to a treatment room    Patient Stated Goals to be able to walk further more comfortably    Currently in Pain? Yes    Pain Score 4     Pain Location Knee    Pain Orientation Right    Pain Descriptors /  Indicators Aching    Pain Type Chronic pain    Pain Onset More than a month ago    Pain Frequency Intermittent    Aggravating Factors  standing and walking    Pain Relieving Factors changing position    Effect of Pain on Daily Activities pain with activity                             [facility] Adult PT Treatment/Exercise - 10/16/19 0001      Lumbar Exercises: Seated   Other Seated Lumbar Exercises scap retraction x20; bilateral ER 2x10 yellow; horizontal abduction yellow 2x10; Seated shoulder flexion x10 x5     Other Seated Lumbar Exercises red band clams shell 2x10; red band hamstring curl 2x10;horizontal abduction 2x10 yellow       Manual Therapy   Manual Therapy Passive ROM    Manual therapy comments LAD 5x30 sec hold bilateral  sec hold bilateral; passive ROM of bilateral hips as tolerated and habitus will allow.     Passive ROM PROM of bilateral hips                   PT Education - 10/16/19 0655    Education  Details reviewed improtance of ther-ex    Person(s) Educated Patient    Methods Explanation;Demonstration;Verbal cues;Tactile cues    Comprehension Verbalized understanding;Returned demonstration;Tactile cues required;Verbal cues required            PT Short Term Goals - 08/28/19 1538      PT SHORT TERM GOAL #1   Title Patient will be indepdnent with very basic HEP    Baseline able to return demonstrate sit<->sidelying without over use of UE in prep for carpal tunnel surgery. Can sit-stand without UEs     Time 3    Period Weeks    Status On-going    Target Date 07/25/19      PT SHORT TERM GOAL #2   Title Patient will ambualte 200' without a seated rest break    Baseline 160'    Time 3    Period Weeks    Status On-going    Target Date 08/29/19      PT SHORT TERM GOAL #3   Title Pt will state 3 pain management strategies or proper posture /body mechanics for household chores    Baseline Therapy continues to review pain management strategies     Time 3    Period Weeks    Status On-going    Target Date 10/09/19      PT SHORT TERM GOAL #4   Title Pt will be able to stand and walk for 400 feet with =/< 4/10 pain    Baseline 164 ft prior to seated break due to pain on 08/29/2017, unable to assess 09/13/2017 due to pt's joint pain.     Time 4    Period Weeks    Status On-going    Target Date 10/09/19      PT SHORT TERM GOAL #5   Title Pt will tolerate functional testing for long term goal assessment (sit to stand, 2 Min walk test)     Baseline can perform 10 consecutive sit-stands without UE support - untimed    Time 4    Period Weeks    Status On-going             PT Long Term Goals - 08/22/19 1254      PT  LONG TERM GOAL #1   Title Patient will transfer sit to stand witout dyspnea and pain    Baseline Pt currently not in routine walking program    Time 6    Period Weeks    Status On-going      PT LONG TERM GOAL #2   Title Patient will ambualte 300' without a  rest break on 2 min walk test in order to improve community ambualtion    Baseline 8-10 minutes    Time 6    Period Weeks    Status On-going      PT LONG TERM GOAL #3   Title Patient will stand a t home for 20 min without self reported increased lower back pain    Time 6    Period Weeks    Status On-going                 Plan - 10/16/19 0657    Clinical Impression Statement Patient was limited today> She had increased knee pain with LE strengthening and increased fatigue with UE. She was able to tolerate manual therapy but could feel it in her buttock. Therapy will continue to progress as tolerated.    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, mining    Rehab Potential Poor    PT Frequency 1x / week    PT Duration 6 weeks    PT Treatment/Interventions ADLs/Self Care Home Management;Cryotherapy;Electrical Stimulation;Iontophoresis 4mg /ml Dexamethasone;Gait training;Moist Heat;DME Instruction;Stair training;Functional mobility training;Therapeutic activities;Therapeutic exercise;Neuromuscular re-education;Manual techniques;Patient/family education;Passive range of motion;Taping    PT Next Visit Plan continue to advance standing activity; continue to encourage home exercises    PT Home Exercise Plan seated bilateral ER seated low range horizontal abduction ankle pumps, low range LAQ. Patient advised noty to perfrom until she sees MD. Patient just shown how to do the exercises    Consulted and Agree with Plan of Care Patient           Patient will benefit  from skilled therapeutic intervention in order to improve the following deficits and impairments:  Abnormal gait, Decreased range of motion, Increased fascial restricitons, Increased muscle spasms, Impaired UE functional use, Decreased knowledge of use of DME, Decreased mobility, Decreased strength, Improper body mechanics, Pain, Impaired perceived functional ability, Decreased safety awareness, Decreased endurance  Visit Diagnosis: Chronic bilateral low back pain without sciatica  Muscle spasm of back  Difficulty in walking, not elsewhere classified  Muscle weakness (generalized)  Radiculopathy, lumbar region     Problem List Patient Active Problem List   Diagnosis Date Noted  . Chronic diastolic heart failure ([place])   . Morbid obesity due to excess calories ([place])   . Bipolar disorder ([place])   . Cellulitis 11/29/2018  . OSA (obstructive sleep apnea) 11/27/2018  . Chronic hypoxemic respiratory failure ([place]) 11/27/2018  . Primary osteoarthritis of both hands 04/19/2017  . Primary osteoarthritis of both knees 04/19/2017  . Primary osteoarthritis of both feet 04/19/2017  . DDD (degenerative disc disease), lumbar 04/19/2017  . History of bipolar disorder 04/19/2017  . Hypothyroidism 06/04/2014  . HTN (hypertension) 01/23/2013  . HLD (hyperlipidemia) 01/23/2013    Carney Living PT DPT  10/16/2019, 7:04 AM  [place] [facility]  [place], [place], [address] Phone: [phone]   Fax:  [phone]  Name: Jessica [name] MRN: [id number] Date of Birth: 04-13-1951

## 2019-10-23 ENCOUNTER — Ambulatory Visit: Payer: Medicare Other | Admitting: Physical Therapy

## 2019-10-26 DIAGNOSIS — G4733 Obstructive sleep apnea (adult) (pediatric): Secondary | ICD-10-CM | POA: Diagnosis not present

## 2019-10-26 DIAGNOSIS — Z6841 Body Mass Index (BMI) 40.0 and over, adult: Secondary | ICD-10-CM | POA: Diagnosis not present

## 2019-10-26 DIAGNOSIS — Z9989 Dependence on other enabling machines and devices: Secondary | ICD-10-CM | POA: Diagnosis not present

## 2019-10-26 DIAGNOSIS — I110 Hypertensive heart disease with heart failure: Secondary | ICD-10-CM | POA: Diagnosis not present

## 2019-10-26 DIAGNOSIS — I5032 Chronic diastolic (congestive) heart failure: Secondary | ICD-10-CM | POA: Diagnosis not present

## 2019-10-26 DIAGNOSIS — J9611 Chronic respiratory failure with hypoxia: Secondary | ICD-10-CM | POA: Diagnosis not present

## 2019-10-30 ENCOUNTER — Encounter: Payer: Self-pay | Admitting: Physical Therapy

## 2019-10-30 ENCOUNTER — Other Ambulatory Visit: Payer: Self-pay

## 2019-10-30 ENCOUNTER — Ambulatory Visit: Payer: Medicare Other | Admitting: Physical Therapy

## 2019-10-30 DIAGNOSIS — M6281 Muscle weakness (generalized): Secondary | ICD-10-CM

## 2019-10-30 DIAGNOSIS — R262 Difficulty in walking, not elsewhere classified: Secondary | ICD-10-CM

## 2019-10-30 DIAGNOSIS — M545 Low back pain, unspecified: Secondary | ICD-10-CM

## 2019-10-30 DIAGNOSIS — G8929 Other chronic pain: Secondary | ICD-10-CM

## 2019-10-30 DIAGNOSIS — M5416 Radiculopathy, lumbar region: Secondary | ICD-10-CM

## 2019-10-30 DIAGNOSIS — M62830 Muscle spasm of back: Secondary | ICD-10-CM

## 2019-10-30 NOTE — Therapy (Signed)
[place], [place], [address] Phone: [phone]   Fax:  [phone]  Physical Therapy Treatment  Patient Details  Name: Jessica [name] MRN: [id number] Date of Birth: 24-Jun-1950 Referring Provider (PT): Donnie Coffin MD    Encounter Date: 10/30/2019   PT End of Session - 10/30/19 1537    Visit Number 11    Number of Visits 15    Date for PT Re-Evaluation 11/08/19    Authorization Type medicare progress note after 19 visits    PT Start Time 5625    PT Stop Time 6389   session limited 2nd to ddecreased sao2 levels   PT Time Calculation (min) 29 min    Activity Tolerance Patient limited by fatigue    Behavior During Therapy [facility] for tasks assessed/performed           Past Medical History:  Diagnosis Date  . Arthritis    lower back, hands, knees  . Bipolar disorder ([place])   . Cellulitis and abscess of left leg 11/29/2018  . CHF (congestive heart failure) ([place])   . Depression   . Dyspnea    with exertion  . GERD (gastroesophageal reflux disease)    patient unsure about this dx - no meds  . Headache   . History of IBS    watches diet  . Hyperlipidemia   . Hypertension   . Hypothyroidism   . Lung edema, acute, with congestive heart failure ([place])   . Obese   . Plantar fasciitis   . Seasonal allergies   . Sleep apnea 2017   uses CPAP   . Spinal stenosis of lumbar region   . SVD (spontaneous vaginal delivery)    x 2  . Systemic lupus ([place])   . Thyroid disease     Past Surgical History:  Procedure Laterality Date  . CARPAL TUNNEL RELEASE Left 09/16/2017   Procedure: LEFT CARPAL TUNNEL RELEASE;  Surgeon: Daryll Brod, MD;  Location: [facility];  Service: Orthopedics;  Laterality: Left;  . carpel tunnel surgery Right   . CATARACT EXTRACTION Bilateral 2007   w/ lens implants  . COLONOSCOPY    . DILATION AND CURETTAGE OF UTERUS    . EYE SURGERY    . FOOT SURGERY Right    hammer toe  .  FRACTURE SURGERY     R tibia and fibula  . LEG SURGERY Right   . TONSILLECTOMY    . WISDOM TOOTH EXTRACTION      There were no vitals filed for this visit.   Subjective Assessment - 10/30/19 1534    Subjective Patient reports over the past week she has felt very fatigued. She has been ambualting up her ramp and becoming fatigued. She has been traveling a lot. She is not sure if that is what is making her tired. Therapy to assess vitals. Please see clinical impression statement.    Limitations Sitting;Lifting;Standing;Walking;House hold activities    How long can you sit comfortably? any prolonged poistioning causes pain    How long can you stand comfortably? 3-4 minutes before she has too much discomfort    How long can you walk comfortably? sugnificant dyspnea coming from the lobby to a treatment room    Patient Stated Goals to be able to walk further more comfortably    Currently in Pain? Yes    Pain Score 8     Pain Location Knee    Pain Orientation Right    Pain Descriptors /  Indicators Aching    Pain Type Chronic pain    Pain Onset More than a month ago    Pain Frequency Intermittent    Aggravating Factors  standing and walking    Pain Relieving Factors changing position    Effect of Pain on Daily Activities pain with activity                             [facility] Adult PT Treatment/Exercise - 10/30/19 0001      Self-Care   Other Self-Care Comments  reviewed the effects of decreased Sao2 and the improtance of contacting MD.       Lumbar Exercises: Seated   Other Seated Lumbar Exercises seated ball roll 2x5 forward and lateral each direction       Manual Therapy   Manual therapy comments LAD 5x30 sec hold bilateral  sec hold bilateral; passive ROM of bilateral hips as tolerated and habitus will allow.     Passive ROM PROM of bilateral hips                   PT Education - 10/30/19 1536    Education Details effects of decreased Sao2 levels     Person(s) Educated Patient    Methods Explanation;Demonstration;Tactile cues    Comprehension Verbalized understanding;Verbal cues required;Tactile cues required;Returned demonstration            PT Short Term Goals - 08/28/19 1538      PT SHORT TERM GOAL #1   Title Patient will be indepdnent with very basic HEP    Baseline able to return demonstrate sit<->sidelying without over use of UE in prep for carpal tunnel surgery. Can sit-stand without UEs     Time 3    Period Weeks    Status On-going    Target Date 07/25/19      PT SHORT TERM GOAL #2   Title Patient will ambualte 200' without a seated rest break    Baseline 160'    Time 3    Period Weeks    Status On-going    Target Date 08/29/19      PT SHORT TERM GOAL #3   Title Pt will state 3 pain management strategies or proper posture /body mechanics for household chores    Baseline Therapy continues to review pain management strategies     Time 3    Period Weeks    Status On-going    Target Date 10/09/19      PT SHORT TERM GOAL #4   Title Pt will be able to stand and walk for 400 feet with =/< 4/10 pain    Baseline 164 ft prior to seated break due to pain on 08/29/2017, unable to assess 09/13/2017 due to pt's joint pain.     Time 4    Period Weeks    Status On-going    Target Date 10/09/19      PT SHORT TERM GOAL #5   Title Pt will tolerate functional testing for long term goal assessment (sit to stand, 2 Min walk test)     Baseline can perform 10 consecutive sit-stands without UE support - untimed    Time 4    Period Weeks    Status On-going             PT Long Term Goals - 08/22/19 1254      PT LONG TERM GOAL #1   Title Patient will transfer  sit to stand witout dyspnea and pain    Baseline Pt currently not in routine walking program    Time 6    Period Weeks    Status On-going      PT LONG TERM GOAL #2   Title Patient will ambualte 300' without a rest break on 2 min walk test in order to improve  community ambualtion    Baseline 8-10 minutes    Time 6    Period Weeks    Status On-going      PT LONG TERM GOAL #3   Title Patient will stand a t home for 20 min without self reported increased lower back pain    Time 6    Period Weeks    Status On-going                 Plan - 10/30/19 1538    Clinical Impression Statement Patients vitals assessed 2nd to progressive decrease in endurance. Patient's B/P was measured at 119/68 HR 92 sao2 89%. Sao2 checked on several fingers and all the same. Patient strongly advised to contact her primary MD or her pulmonologist. Therapy perfromed a limited session. We focused on amnaul therapy to decompress her spine and light llumbar stretching. She was educated on how low o2 sats can effect her endurance. She will contact her MD.    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, mining    Rehab Potential Poor    PT Frequency 1x / week    PT Duration 6 weeks    PT Treatment/Interventions ADLs/Self Care Home Management;Cryotherapy;Electrical Stimulation;Iontophoresis 4mg /ml Dexamethasone;Gait training;Moist Heat;DME Instruction;Stair training;Functional mobility training;Therapeutic activities;Therapeutic exercise;Neuromuscular re-education;Manual techniques;Patient/family education;Passive range of motion;Taping    PT Next Visit Plan continue to advance standing activity; continue to encourage home exercises    PT Home Exercise Plan seated bilateral ER seated low range horizontal abduction ankle pumps, low range LAQ. Patient  advised noty to perfrom until she sees MD. Patient just shown how to do the exercises    Consulted and Agree with Plan of Care Patient           Patient will benefit from skilled therapeutic intervention in order to improve the following deficits and impairments:  Abnormal gait, Decreased range of motion, Increased fascial restricitons, Increased muscle spasms, Impaired UE functional use, Decreased knowledge of use of DME, Decreased mobility, Decreased strength, Improper body mechanics, Pain, Impaired perceived functional ability, Decreased safety awareness, Decreased endurance  Visit Diagnosis: Chronic bilateral low back pain without sciatica  Muscle spasm of back  Difficulty in walking, not elsewhere classified  Muscle weakness (generalized)  Radiculopathy, lumbar region     Problem List Patient Active Problem List   Diagnosis Date Noted  . Chronic diastolic heart failure ([place])   . Morbid obesity due to excess calories ([place])   . Bipolar disorder ([place])   . Cellulitis 11/29/2018  . OSA (obstructive sleep apnea) 11/27/2018  . Chronic hypoxemic respiratory failure ([place]) 11/27/2018  . Primary osteoarthritis of both hands 04/19/2017  . Primary osteoarthritis of both knees 04/19/2017  . Primary osteoarthritis of both feet 04/19/2017  . DDD (degenerative disc disease), lumbar 04/19/2017  . History of bipolar disorder 04/19/2017  . Hypothyroidism 06/04/2014  . HTN (hypertension) 01/23/2013  .  HLD (hyperlipidemia) 01/23/2013    Carney Living PT DPT  10/30/2019, 3:44 PM  [facility] [address] [place], [place], [address] Phone: [phone]   Fax:  [phone]  Name: Jessica [name] MRN: [id number] Date of Birth: 09-Jun-1950

## 2019-10-31 ENCOUNTER — Telehealth: Payer: Self-pay | Admitting: Pulmonary Disease

## 2019-10-31 NOTE — Telephone Encounter (Signed)
Spoke with patient, she states that at PT on 10/30/19 her sats dropped to 89% and today she was 87% on RA.  She has had oxygen in the past and had not needed it recently.  She put on oxygen at 2L and her sats came up to 94%.  I offered to schedule her a virtual visit with one of our NP, however, she declined.  I scheduled her an in office visit with Tammy Parrett for Friday July 23rd at 3pm and let her know that I would get her information to a provider to see if they want to do anything between now and the time she comes into the office.  Dr. Carlis Abbott, please advise.  Thank you.

## 2019-11-01 NOTE — Telephone Encounter (Signed)
Please order a 6 minute walk test to be done sooner than that visit please.  Julian Hy, DO 11/01/19 7:00 AM [facility]

## 2019-11-01 NOTE — Telephone Encounter (Signed)
Left VM for patient to return call to schedule 6 minute walk.  Will await her return call.

## 2019-11-02 ENCOUNTER — Telehealth: Payer: Self-pay | Admitting: Cardiovascular Disease

## 2019-11-02 NOTE — Telephone Encounter (Signed)
Jessica [name] is calling stating she is having some issues she would like to discuss with the nurse to see if they feel she needs to come in to be seen. Please advise.

## 2019-11-02 NOTE — Telephone Encounter (Signed)
Spoke to pt who report she has noticed an increase swelling in both her legs. She report she took an extra dose of lasix on Wednesday but only took normal dose yesterday. MD made aware and advised to increase lasix today and tomorrow.   Pt made aware and verbalized understanding.

## 2019-11-02 NOTE — Telephone Encounter (Signed)
Spoke with the pt and scheduled for 11/13/19 at 11:30 for 6MW

## 2019-11-06 ENCOUNTER — Other Ambulatory Visit: Payer: Self-pay

## 2019-11-06 ENCOUNTER — Ambulatory Visit: Payer: Medicare Other | Attending: Family Medicine | Admitting: Physical Therapy

## 2019-11-06 DIAGNOSIS — M6281 Muscle weakness (generalized): Secondary | ICD-10-CM | POA: Insufficient documentation

## 2019-11-06 DIAGNOSIS — M5416 Radiculopathy, lumbar region: Secondary | ICD-10-CM | POA: Diagnosis not present

## 2019-11-06 DIAGNOSIS — G8929 Other chronic pain: Secondary | ICD-10-CM | POA: Diagnosis not present

## 2019-11-06 DIAGNOSIS — M545 Low back pain, unspecified: Secondary | ICD-10-CM

## 2019-11-06 DIAGNOSIS — R262 Difficulty in walking, not elsewhere classified: Secondary | ICD-10-CM | POA: Insufficient documentation

## 2019-11-06 DIAGNOSIS — M62830 Muscle spasm of back: Secondary | ICD-10-CM | POA: Insufficient documentation

## 2019-11-06 NOTE — Therapy (Addendum)
[place], [place], [address] Phone: [phone]   Fax:  [phone]  Physical Therapy Treatment/Discharge   Patient Details  Name: Jessica [name] MRN: [id number] Date of Birth: 12/06/1950 Referring Provider (PT): Donnie Coffin MD    Encounter Date: 11/06/2019   PT End of Session - 11/06/19 1258    Visit Number 12    Number of Visits 15    Date for PT Re-Evaluation 11/08/19    Authorization Type medicare progress note after 19 visits    PT Start Time 1250    PT Stop Time 1328    PT Time Calculation (min) 38 min    Activity Tolerance Patient limited by fatigue    Behavior During Therapy [facility] for tasks assessed/performed           Past Medical History:  Diagnosis Date  . Arthritis    lower back, hands, knees  . Bipolar disorder ([place])   . Cellulitis and abscess of left leg 11/29/2018  . CHF (congestive heart failure) ([place])   . Depression   . Dyspnea    with exertion  . GERD (gastroesophageal reflux disease)    patient unsure about this dx - no meds  . Headache   . History of IBS    watches diet  . Hyperlipidemia   . Hypertension   . Hypothyroidism   . Lung edema, acute, with congestive heart failure ([place])   . Obese   . Plantar fasciitis   . Seasonal allergies   . Sleep apnea 2017   uses CPAP   . Spinal stenosis of lumbar region   . SVD (spontaneous vaginal delivery)    x 2  . Systemic lupus ([place])   . Thyroid disease     Past Surgical History:  Procedure Laterality Date  . CARPAL TUNNEL RELEASE Left 09/16/2017   Procedure: LEFT CARPAL TUNNEL RELEASE;  Surgeon: Daryll Brod, MD;  Location: [place];  Service: Orthopedics;  Laterality: Left;  . carpel tunnel surgery Right   . CATARACT EXTRACTION Bilateral 2007   w/ lens implants  . COLONOSCOPY    . DILATION AND CURETTAGE OF UTERUS    . EYE SURGERY    . FOOT SURGERY Right    hammer toe  . FRACTURE SURGERY     R tibia and fibula   . LEG SURGERY Right   . TONSILLECTOMY    . WISDOM TOOTH EXTRACTION      There were no vitals filed for this visit.   Subjective Assessment - 11/06/19 1518    Subjective Patient has been put back on suplimental o2. She is currently on 2L. She has a 6 min walk test scheduled in the next 2 weeks. She still has mild dyspnea but it has improved.    Limitations Sitting;Lifting;Standing;Walking;House hold activities    How long can you sit comfortably? any prolonged poistioning causes pain    How long can you stand comfortably? 3-4 minutes before she has too much discomfort    How long can you walk comfortably? sugnificant dyspnea coming from the lobby to a treatment room    Patient Stated Goals to be able to walk further more comfortably    Currently in Pain? Yes    Pain Score 6     Pain Location Back    Pain Orientation Right    Pain Descriptors / Indicators Aching    Pain Type Chronic pain    Pain Onset More than a month ago  Pain Frequency Intermittent    Aggravating Factors  standing and walking    Pain Relieving Factors changing position    Effect of Pain on Daily Activities pain with activity                             [facility] Adult PT Treatment/Exercise - 11/07/19 0001      Self-Care   Other Self-Care Comments  reviewed effects of low o2 levels to endurance. Reviwed how light low level exercises can help. Talked to patient about the improtance of monitoring O2. patient has purchesd a pulse o.       Lumbar Exercises: Seated   Other Seated Lumbar Exercises bilateral er yellpow 2x10; horizontal abduction ;2x10 yellow hamstring curl 2x10 yellow left x10 painful right 2x10     Other Seated Lumbar Exercises seated ball roll 2x5 forward and lateral each direction                   PT Education - 11/06/19 1520    Education Details reviewed improtance of keeping her strength    Person(s) Educated Patient    Methods Explanation;Demonstration;Tactile  cues;Verbal cues    Comprehension Verbal cues required;Verbalized understanding;Returned demonstration;Tactile cues required            PT Short Term Goals - 11/06/19 1346      PT SHORT TERM GOAL #1   Title Patient will be indepdnent with very basic HEP    Baseline able to return demonstrate sit<->sidelying without over use of UE in prep for carpal tunnel surgery. Can sit-stand without UEs     Time 3    Period Weeks    Status On-going    Target Date 07/25/19      PT SHORT TERM GOAL #2   Title Patient will ambualte 200' without a seated rest break    Baseline 160'    Time 3    Period Weeks    Status On-going    Target Date 08/29/19      PT SHORT TERM GOAL #3   Title Pt will state 3 pain management strategies or proper posture /body mechanics for household chores    Baseline Therapy continues to review pain management strategies     Time 3    Period Weeks    Status On-going    Target Date 10/09/19      PT SHORT TERM GOAL #4   Title Pt will be able to stand and walk for 400 feet with =/< 4/10 pain    Baseline 164 ft prior to seated break due to pain on 08/29/2017, unable to assess 09/13/2017 due to pt's joint pain.     Time 4    Period Weeks    Status On-going    Target Date 10/09/19      PT SHORT TERM GOAL #5   Title Pt will tolerate functional testing for long term goal assessment (sit to stand, 2 Min walk test)     Baseline can perform 10 consecutive sit-stands without UE support - untimed    Time 4    Status On-going             PT Long Term Goals - 08/22/19 1254      PT LONG TERM GOAL #1   Title Patient will transfer sit to stand witout dyspnea and pain    Baseline Pt currently not in routine walking program    Time 6  Period Weeks    Status On-going      PT LONG TERM GOAL #2   Title Patient will ambualte 300' without a rest break on 2 min walk test in order to improve community ambualtion    Baseline 8-10 minutes    Time 6    Period Weeks    Status  On-going      PT LONG TERM GOAL #3   Title Patient will stand a t home for 20 min without self reported increased lower back pain    Time 6    Period Weeks    Status On-going                 Plan - 11/06/19 1339    Clinical Impression Statement Therapy continued to educate the patient on how breathing and O2 levels can effect mobility. She was able to complete light exercise today. She was short of breath but nothing signficant. She had not significant increase in pain in her hands. Therapy will continue to advance as tolerated. Therapy will re-assess next visit.    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, mining    Rehab Potential Poor    PT Frequency 1x / week    PT Duration 6 weeks    PT Treatment/Interventions ADLs/Self Care Home Management;Cryotherapy;Electrical Stimulation;Iontophoresis 25m/ml Dexamethasone;Gait training;Moist Heat;DME Instruction;Stair training;Functional mobility training;Therapeutic activities;Therapeutic exercise;Neuromuscular re-education;Manual techniques;Patient/family education;Passive range of motion;Taping    PT Next Visit Plan continue to advance standing activity; continue to encourage home exercises    PT Home Exercise Plan seated bilateral ER seated low range horizontal abduction ankle pumps, low range LAQ. Patient advised noty to perfrom until she sees MD. Patient just shown how to do the exercises    Consulted and Agree with Plan of Care Patient           Patient will benefit from skilled therapeutic intervention in  order to improve the following deficits and impairments:  Abnormal gait, Decreased range of motion, Increased fascial restricitons, Increased muscle spasms, Impaired UE functional use, Decreased knowledge of use of DME, Decreased mobility, Decreased strength, Improper body mechanics, Pain, Impaired perceived functional ability, Decreased safety awareness, Decreased endurance  Visit Diagnosis: Chronic bilateral low back pain without sciatica  Muscle spasm of back  Difficulty in walking, not elsewhere classified  Muscle weakness (generalized)  Radiculopathy, lumbar region    PHYSICAL THERAPY DISCHARGE SUMMARY  Visits from Start of Care: 7  Current functional level related to goals / functional outcomes: Was able to perform exercises during this round of treatment   Remaining deficits: Continued limited gross mobility   Education / Equipment: HEP   Plan: Patient agrees to discharge.  Patient goals were not met. Patient is being discharged due to not returning since the last visit.  ?????      Problem List Patient Active Problem List   Diagnosis Date Noted  . Chronic diastolic heart failure ([facility]   . Morbid obesity due to excess calories ([facility]   . Bipolar disorder ([facility]   . Cellulitis 11/29/2018  . OSA (obstructive sleep apnea) 11/27/2018  . Chronic hypoxemic respiratory failure ([facility] 11/27/2018  . Primary osteoarthritis of both hands 04/19/2017  . Primary osteoarthritis of both knees 04/19/2017  . Primary osteoarthritis of  both feet 04/19/2017  . DDD (degenerative disc disease), lumbar 04/19/2017  . History of bipolar disorder 04/19/2017  . Hypothyroidism 06/04/2014  . HTN (hypertension) 01/23/2013  . HLD (hyperlipidemia) 01/23/2013    Carney Living PT DPT  11/07/2019, 2:18 PM  [facility] [address] [place], [place], [address] Phone: [phone]   Fax:  [phone]  Name: Jessica [name] MRN:  [id number] Date of Birth: [birth year]

## 2019-11-08 ENCOUNTER — Other Ambulatory Visit: Payer: Self-pay

## 2019-11-08 ENCOUNTER — Telehealth: Payer: Self-pay | Admitting: Psychiatry

## 2019-11-08 DIAGNOSIS — F900 Attention-deficit hyperactivity disorder, predominantly inattentive type: Secondary | ICD-10-CM

## 2019-11-08 NOTE — Telephone Encounter (Signed)
Jessica [name] called to request refill of her Vyvanse.  She still wants the 60mg  due to changes/events happening right now.  [facility] [address] in [place]

## 2019-11-08 NOTE — Telephone Encounter (Signed)
Last refill 10/12/2019 Vyvanse 60 mg Pended for [name] to review and submit  Has upcoming apt with Dr. Clovis Pu

## 2019-11-09 MED ORDER — LISDEXAMFETAMINE DIMESYLATE 60 MG PO CAPS: 60.0000 mg | ORAL_CAPSULE | ORAL | 0 refills | Status: DC

## 2019-11-13 ENCOUNTER — Ambulatory Visit (INDEPENDENT_AMBULATORY_CARE_PROVIDER_SITE_OTHER): Payer: Medicare Other

## 2019-11-13 ENCOUNTER — Other Ambulatory Visit: Payer: Self-pay

## 2019-11-13 DIAGNOSIS — I5032 Chronic diastolic (congestive) heart failure: Secondary | ICD-10-CM

## 2019-11-13 DIAGNOSIS — J9611 Chronic respiratory failure with hypoxia: Secondary | ICD-10-CM

## 2019-11-13 NOTE — Progress Notes (Signed)
Six minute walk was performed today on 2L continuous oxygen. No desaturations.

## 2019-11-19 DIAGNOSIS — M47812 Spondylosis without myelopathy or radiculopathy, cervical region: Secondary | ICD-10-CM | POA: Diagnosis not present

## 2019-11-19 DIAGNOSIS — R200 Anesthesia of skin: Secondary | ICD-10-CM | POA: Diagnosis not present

## 2019-11-19 DIAGNOSIS — M180 Bilateral primary osteoarthritis of first carpometacarpal joints: Secondary | ICD-10-CM | POA: Diagnosis not present

## 2019-11-20 ENCOUNTER — Other Ambulatory Visit: Payer: Self-pay | Admitting: Psychiatry

## 2019-11-20 DIAGNOSIS — R202 Paresthesia of skin: Secondary | ICD-10-CM | POA: Diagnosis not present

## 2019-11-20 DIAGNOSIS — R109 Unspecified abdominal pain: Secondary | ICD-10-CM | POA: Diagnosis not present

## 2019-11-20 DIAGNOSIS — R7989 Other specified abnormal findings of blood chemistry: Secondary | ICD-10-CM

## 2019-11-23 ENCOUNTER — Ambulatory Visit (INDEPENDENT_AMBULATORY_CARE_PROVIDER_SITE_OTHER): Payer: Medicare Other

## 2019-11-23 ENCOUNTER — Encounter: Payer: Self-pay | Admitting: Adult Health

## 2019-11-23 ENCOUNTER — Other Ambulatory Visit: Payer: Self-pay

## 2019-11-23 ENCOUNTER — Ambulatory Visit (INDEPENDENT_AMBULATORY_CARE_PROVIDER_SITE_OTHER): Payer: Medicare Other | Admitting: Adult Health

## 2019-11-23 VITALS — BP 138/82 | HR 96 | Temp 98.3°F | Ht 62.0 in | Wt 307.6 lb

## 2019-11-23 DIAGNOSIS — I5032 Chronic diastolic (congestive) heart failure: Secondary | ICD-10-CM

## 2019-11-23 DIAGNOSIS — G4733 Obstructive sleep apnea (adult) (pediatric): Secondary | ICD-10-CM

## 2019-11-23 DIAGNOSIS — J9611 Chronic respiratory failure with hypoxia: Secondary | ICD-10-CM | POA: Diagnosis not present

## 2019-11-23 DIAGNOSIS — J811 Chronic pulmonary edema: Secondary | ICD-10-CM | POA: Diagnosis not present

## 2019-11-23 DIAGNOSIS — I509 Heart failure, unspecified: Secondary | ICD-10-CM | POA: Diagnosis not present

## 2019-11-23 NOTE — Assessment & Plan Note (Signed)
Continue on nocturnal CPAP.  Follow-up with the sleep specialist that patient follows with in [facility].

## 2019-11-23 NOTE — Addendum Note (Signed)
Addended by: Satira Sark D on: 11/23/2019 04:00 PM   Modules accepted: Orders

## 2019-11-23 NOTE — Addendum Note (Signed)
Addended by: Suzzanne Cloud E on: 11/23/2019 04:29 PM   Modules accepted: Orders

## 2019-11-23 NOTE — Assessment & Plan Note (Signed)
Chronic hypoxic respiratory failure most likely secondary to diastolic heart failure, OSA/OHS, deconditioning with morbid obesity (BMI 56) Check chest x-ray today.  Labs including BNP.  There was no record of patient stopping oxygen with activity in December 2020.  Suspect patient has exertional hypoxemia and this has been ongoing.  Now appears to have some volume overload with noticeable exertional hypoxemia.  Patient continue on oxygen 2 L to keep O2 saturation greater than 88 to 90%.  Encouraged her to take Demadex 20 mg extra for the next 3 days.  Plan  Patient Instructions  Chest xray today  Labs today .  Increase Torsemide 20mg  2 tabs for 3 days , then 20mg  daily.  Follow up with Cardiology.  Continue on Oxygen 2l/m , to Keep Oxygen level >88-90%.  Continue on CPAP At bedtime .  Follow up with Dr. Loanne Drilling in 4 weeks and As needed   Please contact office for sooner follow up if symptoms do not improve or worsen or seek emergency care

## 2019-11-23 NOTE — Assessment & Plan Note (Signed)
Chronic diastolic heart failure.  Appears to have some mild volume overload. Check labs with BMP.  Torsemide 20 mg extra x 3 days  Plan  Patient Instructions  Chest xray today  Labs today .  Increase Torsemide 20mg  2 tabs for 3 days , then 20mg  daily.  Follow up with Cardiology.  Continue on Oxygen 2l/m , to Keep Oxygen level >88-90%.  Continue on CPAP At bedtime .  Follow up with Dr. Loanne Drilling in 4 weeks and As needed   Please contact office for sooner follow up if symptoms do not improve or worsen or seek emergency care

## 2019-11-23 NOTE — Progress Notes (Signed)
@Patient  ID: Jessica [name], female    DOB: [birth year], 69 y.o.   MRN: [id number]  Chief Complaint  Patient presents with  . Follow-up    Referring provider: Alroy Dust, L.Marlou Sa, MD  HPI: 69 year old female never smoker followed for Hypoxic respiratory failure on oxygen  And diastolic heart failure OSA on CPAP followed by sleep medicine at [facility] .  Medical history is significant for spinal stenosis and lupus, bipolar disorder,  TEST/EVENTS :  Echo 12/2018 EF 27-51%, Diastolic dysfunction   7/00/1749 Follow up : O2 RF , Diastolic CHF  Patient returns for follow up . Last seen 04/2020. Says over last month has noticed that she has been more short of breath and dropping her oxygen with activity around 87-88%. She has been on oxygen for a while but stopped using it except at bedtime in late December. Last office noted to use oxygen with activity.  Over last month started back oxygen at 2l/m all the time. O2 sats 98% on 2l/m . Increased leg swelling , weight up 20 lbs.  Patient denies any cough congestion fever abdominal pain nausea vomiting or   Chest pain. She is going out of town next week and is trying to get things settled before she leaves.  Allergies  Allergen Reactions  . Ibuprofen Diarrhea, Nausea Only and Other (See Comments)    Extreme stomach pain  Makes her feel bad  . Codeine     UNSPECIFIED REACTION   . Nabumetone     UNSPECIFIED REACTION   . Other     Seasonal allergies  . Promethazine     UNSPECIFIED REACTION   . Amoxicillin-Pot Clavulanate Nausea And Vomiting  . Erythromycin Base Diarrhea  . Promethazine-Codeine Other (See Comments)    "Makes her feel bad"    Immunization History  Administered Date(s) Administered  . Influenza, High Dose Seasonal PF 02/12/2019  . PFIZER SARS-COV-2 Vaccination 06/24/2019, 07/16/2019    Past Medical History:  Diagnosis Date  . Arthritis    lower back, hands, knees  . Bipolar disorder ([place])   . Cellulitis and  abscess of left leg 11/29/2018  . CHF (congestive heart failure) ([place])   . Depression   . Dyspnea    with exertion  . GERD (gastroesophageal reflux disease)    patient unsure about this dx - no meds  . Headache   . History of IBS    watches diet  . Hyperlipidemia   . Hypertension   . Hypothyroidism   . Lung edema, acute, with congestive heart failure ([place])   . Obese   . Plantar fasciitis   . Seasonal allergies   . Sleep apnea 2017   uses CPAP   . Spinal stenosis of lumbar region   . SVD (spontaneous vaginal delivery)    x 2  . Systemic lupus ([place])   . Thyroid disease     Tobacco History: Social History   Tobacco Use  Smoking Status Never Smoker  Smokeless Tobacco Never Used   Counseling given: Not Answered   Outpatient Medications Prior to Visit  Medication Sig Dispense Refill  . acetaminophen (TYLENOL) 650 MG CR tablet Take 650 mg by mouth daily.     . Acetylcysteine 600 MG CAPS Take 600 mg by mouth 2 (two) times daily. NAC    . amLODipine (NORVASC) 10 MG tablet Take 1 tablet (10 mg total) by mouth daily. 60 tablet 0  . Carboxymeth-Glycerin-Polysorb (REFRESH OPTIVE ADVANCED OP) Apply to eye.    . cycloSPORINE (  RESTASIS) 0.05 % ophthalmic emulsion Place 1 drop into both eyes 2 (two) times daily.    . DULoxetine (CYMBALTA) 60 MG capsule Take 1 capsule (60 mg total) by mouth daily. 90 capsule 0  . gabapentin (NEURONTIN) 300 MG capsule 1 in AM and 3 at evening 360 capsule 1  . levothyroxine (SYNTHROID) 25 MCG tablet Take 25 mcg by mouth daily before breakfast.     . lisdexamfetamine (VYVANSE) 60 MG capsule Take 1 capsule (60 mg total) by mouth every morning. 30 capsule 0  . lithium carbonate 150 MG capsule Take 5 capsules (750 mg total) by mouth at bedtime. 450 capsule 0  . LORazepam (ATIVAN) 0.5 MG tablet Take 1 tablet (0.5 mg total) by mouth 2 (two) times daily as needed for anxiety. 10 tablet 0  . metoprolol tartrate (LOPRESSOR) 25 MG tablet Take 0.5 tablets (12.5 mg  total) by mouth 2 (two) times daily. 60 tablet 0  . Multiple Vitamins-Minerals (MULTIVITAMIN WITH MINERALS) tablet Take 1 tablet by mouth daily.    . Multiple Vitamins-Minerals (PRESERVISION AREDS 2) CAPS Take 1 capsule 2 (two) times daily by mouth.    . potassium chloride SA (KLOR-CON) 20 MEQ tablet Take 1 tablet by mouth every other day on days Torsemide is taken 45 tablet 1  . pravastatin (PRAVACHOL) 20 MG tablet Take 20 mg at bedtime by mouth.     . SYNTHROID 200 MCG tablet TAKE 1 TABLET BY MOUTH EVERY MORNING ON AN EMPTY STOMACH    . Thiamine HCl (VITAMIN B1) 100 MG TABS Take 1 tablet by mouth daily.    Marland Kitchen torsemide (DEMADEX) 20 MG tablet Take 20 mg daily 36 tablet 3  . triamcinolone cream (KENALOG) 0.5 % APPLY SPARINGLY EXTERNALLY TO THE AFFECTED AREA TWICE A DAY    . vitamin B-12 (CYANOCOBALAMIN) 100 MCG tablet Take 100 mcg by mouth 2 (two) times daily.    . Vitamin D, Ergocalciferol, (DRISDOL) 1.25 MG (50000 UNIT) CAPS capsule TAKE 1 CAPSULE (50,000 UNITS TOTAL) BY MOUTH EVERY 7 (SEVEN) DAYS. 12 capsule 2   No facility-administered medications prior to visit.     Review of Systems:   Constitutional:   No  weight loss, night sweats,  Fevers, chills, + fatigue, or  lassitude.  HEENT:   No headaches,  Difficulty swallowing,  Tooth/dental problems, or  Sore throat,                No sneezing, itching, ear ache, nasal congestion, post nasal drip,   CV:  No chest pain,  Orthopnea, PND,++swelling in lower extremities, no anasarca, dizziness, palpitations, syncope.   GI  No heartburn, indigestion, abdominal pain, nausea, vomiting, diarrhea, change in bowel habits, loss of appetite, bloody stools.   Resp:   No excess mucus, no productive cough,  No non-productive cough,  No coughing up of blood.  No change in color of mucus.  No wheezing.  No chest wall deformity  Skin: no rash or lesions.  GU: no dysuria, change in color of urine, no urgency or frequency.  No flank pain, no hematuria     MS:  No joint pain or swelling.  No decreased range of motion.  No back pain.    Physical Exam  BP (!) 138/82 (BP Location: Left Arm, Cuff Size: Normal)   Pulse 96   Temp 98.3 F (36.8 C) (Oral)   Ht 5\' 2"  (1.575 m)   Wt (!) 307 lb 9.6 oz (139.5 kg)   SpO2 98%  BMI 56.26 kg/m   GEN: A/Ox3; pleasant , NAD, well nourished , BMI 56    HEENT:  [place]/AT,    NOSE-clear, THROAT-clear, no lesions, no postnasal drip or exudate noted.  Class 3-4 MP airway   NECK:  Supple w/ fair ROM; no JVD; normal carotid impulses w/o bruits; no thyromegaly or nodules palpated; no lymphadenopathy.    RESP  Clear  P & A; w/o, wheezes/ rales/ or rhonchi. no accessory muscle use, no dullness to percussion  CARD:  RRR, no m/r/g, 2-3 +peripheral edema, pulses intact, no cyanosis or clubbing.  GI:   Soft & nt; nml bowel sounds; no organomegaly or masses detected.   Musco: Warm bil, no deformities or joint swelling noted.   Neuro: alert, no focal deficits noted.    Skin: Warm, no lesions or rashes    Lab Results:   BNP   ProBNP No results found for: PROBNP  Imaging: No results found.    PFT Results Latest Ref Rng & Units 01/29/2019  FVC-Pre L 0.61  FVC-Predicted Pre % 21  Pre FEV1/FVC % % 100  FEV1-Pre L 0.61  FEV1-Predicted Pre % 28    No results found for: NITRICOXIDE      Assessment & Plan:   Chronic hypoxemic respiratory failure (HCC) Chronic hypoxic respiratory failure most likely secondary to diastolic heart failure, OSA/OHS, deconditioning with morbid obesity (BMI 56) Check chest x-ray today.  Labs including BNP.  There was no record of patient stopping oxygen with activity in December 2020.  Suspect patient has exertional hypoxemia and this has been ongoing.  Now appears to have some volume overload with noticeable exertional hypoxemia.  Patient continue on oxygen 2 L to keep O2 saturation greater than 88 to 90%.  Encouraged her to take Demadex 20 mg extra for the next 3  days.  Plan  Patient Instructions  Chest xray today  Labs today .  Increase Torsemide 20mg  2 tabs for 3 days , then 20mg  daily.  Follow up with Cardiology.  Continue on Oxygen 2l/m , to Keep Oxygen level >88-90%.  Continue on CPAP At bedtime .  Follow up with Dr. Loanne Drilling in 4 weeks and As needed   Please contact office for sooner follow up if symptoms do not improve or worsen or seek emergency care        Chronic diastolic heart failure ([place]) Chronic diastolic heart failure.  Appears to have some mild volume overload. Check labs with BMP.  Torsemide 20 mg extra x 3 days  Plan  Patient Instructions  Chest xray today  Labs today .  Increase Torsemide 20mg  2 tabs for 3 days , then 20mg  daily.  Follow up with Cardiology.  Continue on Oxygen 2l/m , to Keep Oxygen level >88-90%.  Continue on CPAP At bedtime .  Follow up with Dr. Loanne Drilling in 4 weeks and As needed   Please contact office for sooner follow up if symptoms do not improve or worsen or seek emergency care       OSA (obstructive sleep apnea) Continue on nocturnal CPAP.  Follow-up with the sleep specialist that patient follows with in [place], NP 11/23/2019

## 2019-11-23 NOTE — Patient Instructions (Addendum)
Chest xray today  Labs today .  Increase Torsemide 20mg  2 tabs for 3 days , then 20mg  daily.  Follow up with Cardiology.  Continue on Oxygen 2l/m , to Keep Oxygen level >88-90%.  Continue on CPAP At bedtime .  Follow up with Dr. Loanne Drilling in 4 weeks and As needed   Please contact office for sooner follow up if symptoms do not improve or worsen or seek emergency care  Negative.

## 2019-11-24 LAB — BASIC METABOLIC PANEL
BUN: 24 mg/dL (ref 7–25)
CO2: 28 mmol/L (ref 20–32)
Calcium: 10.9 mg/dL — ABNORMAL HIGH (ref 8.6–10.4)
Chloride: 101 mmol/L (ref 98–110)
Creat: 0.93 mg/dL (ref 0.60–0.93)
Glucose, Bld: 106 mg/dL — ABNORMAL HIGH (ref 65–99)
Potassium: 4.6 mmol/L (ref 3.5–5.3)
Sodium: 134 mmol/L — ABNORMAL LOW (ref 135–146)

## 2019-11-24 LAB — BRAIN NATRIURETIC PEPTIDE: Brain Natriuretic Peptide: 12 pg/mL (ref <100)

## 2019-11-26 ENCOUNTER — Telehealth: Payer: Self-pay | Admitting: Adult Health

## 2019-11-26 NOTE — Telephone Encounter (Signed)
Spoke with pt. She is aware of results. Nothing further was needed.  

## 2019-11-27 ENCOUNTER — Ambulatory Visit: Payer: Medicare Other | Admitting: Physical Therapy

## 2019-11-28 ENCOUNTER — Telehealth: Payer: Self-pay | Admitting: Adult Health

## 2019-11-28 NOTE — Telephone Encounter (Signed)
Spoke with the pt  She states her DME gave her a pulsed regulator to try since she will be traveling soon to try an conserve her o2  She tried using this and states that she developed HA and nasuea  She checked her sats on 2lpm and they were 89-90% so increased to 3lpm pulsed o2  She states that this did not help so went back to continuous flow and she said that this helped  I advised that if she uses pulsed o2 she needs to breathe in through her nose  She verbalized understanding  She is asking if the pulsed o2 could have caused her HA and nausea  Please advise thanks!

## 2019-11-29 NOTE — Telephone Encounter (Signed)
Pt aware of response per TP  She is now asking if she should extend additional demadex b/c her breathing is unchanged since that last visit 7/23  She states there was fluid noted on her cxr from that date and she had doubled her torsemide x 3 days Please advise thanks

## 2019-11-29 NOTE — Telephone Encounter (Signed)
Spoke with the pt and notified of recs per TP and she verbalized understanding  Nothing further needed 

## 2019-11-29 NOTE — Telephone Encounter (Signed)
Advised patient to weigh daily keep a log of her weights.  Low-salt diet.  May take an extra torsemide if needed or weight goes up by 3 pounds. Do not want to over diurese or make her weak with too much diuretic.  If breathing is not improving will need sooner follow-up  Please contact office for sooner follow up if symptoms do not improve or worsen or seek emergency care  If she needs extra torsemide can send 1 refill to pharmacy

## 2019-11-29 NOTE — Telephone Encounter (Signed)
Yes it can if oxygen levels are dropping .

## 2019-12-14 ENCOUNTER — Other Ambulatory Visit: Payer: Self-pay | Admitting: Psychiatry

## 2019-12-14 ENCOUNTER — Telehealth: Payer: Self-pay | Admitting: Psychiatry

## 2019-12-14 DIAGNOSIS — F900 Attention-deficit hyperactivity disorder, predominantly inattentive type: Secondary | ICD-10-CM

## 2019-12-14 MED ORDER — LISDEXAMFETAMINE DIMESYLATE 60 MG PO CAPS: 60.0000 mg | ORAL_CAPSULE | ORAL | 0 refills | Status: DC

## 2019-12-14 NOTE — Telephone Encounter (Signed)
Rx sent 

## 2019-12-14 NOTE — Telephone Encounter (Signed)
Jessica [name] called to request refill of her Vyvanse 60mg .  Appt 8/17.  Will discuss dosing of this med at the appt.  [facility] [address]  Took her last one today.  Requests that it be sent in today.

## 2019-12-18 ENCOUNTER — Encounter: Payer: Self-pay | Admitting: Psychiatry

## 2019-12-18 ENCOUNTER — Other Ambulatory Visit: Payer: Self-pay

## 2019-12-18 ENCOUNTER — Ambulatory Visit (INDEPENDENT_AMBULATORY_CARE_PROVIDER_SITE_OTHER): Payer: Medicare Other | Admitting: Psychiatry

## 2019-12-18 DIAGNOSIS — F900 Attention-deficit hyperactivity disorder, predominantly inattentive type: Secondary | ICD-10-CM | POA: Diagnosis not present

## 2019-12-18 MED ORDER — LISDEXAMFETAMINE DIMESYLATE 40 MG PO CAPS: 40.0000 mg | ORAL_CAPSULE | ORAL | 0 refills | Status: DC

## 2019-12-18 NOTE — Progress Notes (Signed)
Jessica [name] [place] [id number] 06-29-1950 69 y.o.  Subjective:   Patient ID:  Jessica [name] is a 69 y.o. (DOB [birth year]) female.  Chief Complaint:  Chief Complaint  Patient presents with   Follow-up   Manic Behavior   ADHD    HPI  Jessica [name] presents to the office today for follow-up of bipolar disorder and OSA.    seen March 07 2019.  She continues to have a background level of irritability that is problematic.  But she requested No meds changed.  06/06/2019 appointment the following is noted: H says less bouts of anger but a bit more weepy.  i't s triggered.  Often bc of some action or inaction on his part.  He helps her a lot DT her mobility problems.  She's fearful of falling.  She'll get agitated if he walks off to leave her to do something else while she's walking.   Most of the time coping OK.  Gets tired of staying in so much and she can't drive DT OSA.  She is protesting that bc is some bettter with OSA DT weight loss.   Health better with reduced salt and loss  Lost 32# with diuretic.  Aches with less pain med. Awaken 2-3 times and some EMA at times.  Nocturia is a contributor.   Upset she can't drive now DT sleepiness with OSA No meds were changed except she was restarted on vitamin D which she had run out of.  10/09/2019 appointment the following is noted: Most of the time good except under stress reactive.  Went to [facility].  Packing takes forever.  H bothered by it. Thinks it would be good to stop the Vyvanse bc crash contributes to irritabiity and restrictions on it is difficulty Mood variable from OK to lonely and perturbed over the isolation.  Covid has a negative effect on her mood. Recognizes she gets angry and impatient and directed at husband.   Has had a spell of irritability at times. .   Current depression not severe.  Sleep poor with EMA.  Lies in bed a lot.  Is using CPAP.  Her doctor at [facility] says it's the worst case she's ever seen.  Not  driving per the sleep doctor.   Naps and broken sleep.  Plan: Taper Vyvanse in 2-4 week intervals at her discretion as follows: 40 mg daily, then 30 mg, then stop it. Option modafinil in place of this discussed with her. Check lithium level on 10/12/19= 0.8 on 750 mg daily.  11/08/19 [facility] [place] called to request refill of her Vyvanse.  She still wants the 60mg  due to changes/events happening right now.  [facility] to [facility] on [address] in [place]  12/18/19 appt with the following noted: Never tapered off Vyvanse.  Trip good but so much more limited physically than in the past.  Mobility was difficult and swimming difficult.  Enjoyed trip. Went to [place] and [employer].  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 [name] said don't fly off handle as much but is whiny if tired. In June low o2 and needed supplemental.  Better now. Had conversation with D about what would happen if H died bc pt cannot care for herself.  Bothering me now realizing how dependent she is on him.  Hasn't looked into anything but occ brooding about it. No expectation that she could stay with kids.   Gabapentin helps bladder pain. No concerns about meds.  Past Psychiatric  Medication Trials: Abilify, Seroquel 600, Depakote, Trileptal, olanzapine,, ziprasidone, venlafaxine, lamotrigine 200,  Ritalin, Lunesta, lithium, lorazepam, Nuvigil, Vyvanse, Ritalin, citalopram, Wellbutrin,  Ambien,  and others  Review of Systems:  Review of Systems  Constitutional: Positive for fatigue.  Respiratory: Positive for shortness of breath.   Genitourinary: Positive for frequency, pelvic pain and urgency.  Musculoskeletal: Positive for arthralgias, back pain and gait problem.       Uses walker  Neurological: Positive for weakness. Negative for tremors.  Psychiatric/Behavioral: Positive for dysphoric mood. Negative for agitation, behavioral problems, confusion, decreased concentration, hallucinations, self-injury,  sleep disturbance and suicidal ideas. The patient is not nervous/anxious and is not hyperactive.     Medications: I have reviewed the patient's current medications.  Current Outpatient Medications  Medication Sig Dispense Refill   acetaminophen (TYLENOL) 650 MG CR tablet Take 650 mg by mouth daily.      Acetylcysteine 600 MG CAPS Take 600 mg by mouth 2 (two) times daily. NAC     amLODipine (NORVASC) 10 MG tablet Take 1 tablet (10 mg total) by mouth daily. 60 tablet 0   Carboxymeth-Glycerin-Polysorb (REFRESH OPTIVE ADVANCED OP) Apply to eye.     cycloSPORINE (RESTASIS) 0.05 % ophthalmic emulsion Place 1 drop into both eyes 2 (two) times daily.     DULoxetine (CYMBALTA) 60 MG capsule Take 1 capsule (60 mg total) by mouth daily. 90 capsule 0   gabapentin (NEURONTIN) 300 MG capsule 1 in AM and 3 at evening 360 capsule 1   levothyroxine (SYNTHROID) 25 MCG tablet Take 25 mcg by mouth daily before breakfast.      lisdexamfetamine (VYVANSE) 40 MG capsule Take 1 capsule (40 mg total) by mouth every morning. 30 capsule 0   lithium carbonate 150 MG capsule Take 5 capsules (750 mg total) by mouth at bedtime. 450 capsule 0   LORazepam (ATIVAN) 0.5 MG tablet Take 1 tablet (0.5 mg total) by mouth 2 (two) times daily as needed for anxiety. 10 tablet 0   metoprolol tartrate (LOPRESSOR) 25 MG tablet Take 0.5 tablets (12.5 mg total) by mouth 2 (two) times daily. 60 tablet 0   Multiple Vitamins-Minerals (MULTIVITAMIN WITH MINERALS) tablet Take 1 tablet by mouth daily.     Multiple Vitamins-Minerals (PRESERVISION AREDS 2) CAPS Take 1 capsule 2 (two) times daily by mouth.     potassium chloride SA (KLOR-CON) 20 MEQ tablet Take 1 tablet by mouth every other day on days Torsemide is taken 45 tablet 1   pravastatin (PRAVACHOL) 20 MG tablet Take 20 mg at bedtime by mouth.      SYNTHROID 200 MCG tablet TAKE 1 TABLET BY MOUTH EVERY MORNING ON AN EMPTY STOMACH     Thiamine HCl (VITAMIN B1) 100 MG TABS  Take 1 tablet by mouth daily.     torsemide (DEMADEX) 20 MG tablet Take 20 mg daily 36 tablet 3   triamcinolone cream (KENALOG) 0.5 % APPLY SPARINGLY EXTERNALLY TO THE AFFECTED AREA TWICE A DAY     vitamin B-12 (CYANOCOBALAMIN) 100 MCG tablet Take 100 mcg by mouth 2 (two) times daily.     Vitamin D, Ergocalciferol, (DRISDOL) 1.25 MG (50000 UNIT) CAPS capsule TAKE 1 CAPSULE (50,000 UNITS TOTAL) BY MOUTH EVERY 7 (SEVEN) DAYS. 12 capsule 2   No current facility-administered medications for this visit.    Medication Side Effects: Other: ? sleepiness unlikely related.  Allergies:  Allergies  Allergen Reactions   Ibuprofen Diarrhea, Nausea Only and Other (See Comments)    Extreme stomach pain  Makes her feel bad   Codeine     UNSPECIFIED REACTION    Nabumetone     UNSPECIFIED REACTION    Other     Seasonal allergies   Promethazine     UNSPECIFIED REACTION    Amoxicillin-Pot Clavulanate Nausea And Vomiting   Erythromycin Base Diarrhea   Promethazine-Codeine Other (See Comments)    "Makes her feel bad"    Past Medical History:  Diagnosis Date   Arthritis    lower back, hands, knees   Bipolar disorder (HCC)    Cellulitis and abscess of left leg 11/29/2018   CHF (congestive heart failure) (HCC)    Depression    Dyspnea    with exertion   GERD (gastroesophageal reflux disease)    patient unsure about this dx - no meds   Headache    History of IBS    watches diet   Hyperlipidemia    Hypertension    Hypothyroidism    Lung edema, acute, with congestive heart failure ([place])    Obese    Plantar fasciitis    Seasonal allergies    Sleep apnea 2017   uses CPAP    Spinal stenosis of lumbar region    SVD (spontaneous vaginal delivery)    x 2   Systemic lupus ([place])    Thyroid disease     Family History  Problem Relation Age of Onset   Thyroid disease Mother    Breast cancer Mother    Heart disease Mother    Bipolar disorder Father     Diabetes Father    Heart disease Father    Dementia Father     Social History   Socioeconomic History   Marital status: Married    Spouse name: Not on file   Number of children: Not on file   Years of education: Not on file   Highest education level: Not on file  Occupational History   Not on file  Tobacco Use   Smoking status: Never Smoker   Smokeless tobacco: Never Used  Vaping Use   Vaping Use: Never used  Substance and Sexual Activity   Alcohol use: Not Currently   Drug use: Never   Sexual activity: Not on file  Other Topics Concern   Not on file  Social History Narrative   She lives in a single level home with her husband.  She has two grown children.   She taught college accounting courses, retired in 2005.    Highest level of education:  Designer, jewellery in [employer]   Social Determinants of Health   Financial Resource Strain:    Difficulty of Paying Living Expenses:   Food Insecurity:    Worried About Charity fundraiser in the Last Year:    Arboriculturist in the Last Year:   Transportation Needs:    Film/video editor (Medical):    Lack of Transportation (Non-Medical):   Physical Activity:    Days of Exercise per Week:    Minutes of Exercise per Session:   Stress:    Feeling of Stress :   Social Connections:    Frequency of Communication with Friends and Family:    Frequency of Social Gatherings with Friends and Family:    Attends Religious Services:    Active Member of Clubs or Organizations:    Attends Archivist Meetings:    Marital Status:   Intimate Partner Violence:    Fear of Current or Ex-Partner:    Emotionally  Abused:    Physically Abused:    Sexually Abused:     Past Medical History, Surgical history, Social history, and Family history were reviewed and updated as appropriate.   5 gkids 15 to 7 mos.  Please see review of systems for further details on the patient's review from today.    Objective:   Physical Exam:  There were no vitals taken for this visit.  Physical Exam Constitutional:      General: She is not in acute distress.    Appearance: She is well-developed. She is obese.  Musculoskeletal:        General: No deformity.  Neurological:     Mental Status: She is alert and oriented to person, place, and time.     Cranial Nerves: No dysarthria.     Motor: Weakness present.     Coordination: Coordination abnormal.     Gait: Gait abnormal.  Psychiatric:        Attention and Perception: Attention and perception normal. She does not perceive auditory or visual hallucinations.        Mood and Affect: Mood is anxious. Mood is not depressed or elated. Affect is not labile, blunt, angry, tearful or inappropriate.        Speech: Speech is not rapid and pressured or slurred.        Behavior: Behavior normal. Behavior is cooperative.        Thought Content: Thought content normal. Thought content is not paranoid or delusional. Thought content does not include homicidal or suicidal ideation. Thought content does not include homicidal or suicidal plan.        Cognition and Memory: Cognition and memory normal.        Judgment: Judgment normal.     Comments: Insight fair. Tendency to irritability apparently ongoing. Talkative.and speech clearer. Using walker and on O2.      Lab Review:     Component Value Date/Time   NA 134 (L) 11/23/2019 1629   NA 139 07/16/2019 1447   K 4.6 11/23/2019 1629   CL 101 11/23/2019 1629   CO2 28 11/23/2019 1629   GLUCOSE 106 (H) 11/23/2019 1629   BUN 24 11/23/2019 1629   BUN 26 07/16/2019 1447   CREATININE 0.93 11/23/2019 1629   CALCIUM 10.9 (H) 11/23/2019 1629   PROT 6.6 04/16/2019 1613   ALBUMIN 4.0 04/16/2019 1613   AST 22 04/16/2019 1613   ALT 17 04/16/2019 1613   ALKPHOS 146 (H) 04/16/2019 1613   BILITOT 0.2 04/16/2019 1613   GFRNONAA 65 07/16/2019 1447   GFRNONAA 66 05/15/2018 1118   GFRAA 75 07/16/2019 1447    GFRAA 76 05/15/2018 1118       Component Value Date/Time   WBC 16.7 (H) 12/11/2018 0753   RBC 4.62 12/11/2018 0753   HGB 14.4 12/11/2018 0753   HCT 47.6 (H) 12/11/2018 0753   PLT 337 12/11/2018 0753   MCV 103.0 (H) 12/11/2018 0753   MCH 31.2 12/11/2018 0753   MCHC 30.3 12/11/2018 0753   RDW 13.3 12/11/2018 0753   LYMPHSABS 2.0 12/11/2018 0753   MONOABS 1.7 (H) 12/11/2018 0753   EOSABS 0.5 12/11/2018 0753   BASOSABS 0.1 12/11/2018 0753    Lithium Lvl  Date Value Ref Range Status  10/12/2019 0.8 0.6 - 1.2 mmol/L Final  10/12/19 lithium on 750 mg daily     No results found for: PHENYTOIN, PHENOBARB, VALPROATE, CBMZ   .res Assessment: Plan:    Attention deficit hyperactivity disorder (ADHD), predominantly  inattentive type - Plan: lisdexamfetamine (VYVANSE) 40 MG capsule  Very severe.   Mild Cognitive Impairment stable and appears better with O2 but needs it intermittently only.  Emphasized the importance of CPAP as she struggels with the treatment.  Talked about the effect of OSA on the brain.    Overall mood stability is Ok with more lithium 750 mg daily. Lithium level is good.  BMP stable except watch hypercalcemia.  She has not done adequately well with alternative mood stabilizers.  Disc lab tests.  Disc need to monitor hypercalcemia.  Counseled patient regarding potential benefits, risks, and side effects of lithium to include potential risk of lithium affecting thyroid and renal function.  Discussed need for periodic lab monitoring to determine drug level and to assess for potential adverse effects.  Counseled patient regarding signs and symptoms of lithium toxicity and advised that they notify office immediately or seek urgent medical attention if experiencing these signs and symptoms.  Patient advised to contact office with any questions or concerns.  Repeat lithium level bc of reactivity and irritability on 10/12/19 = -.8 BMP on 7.23/21 normal except Ca 10.9 Discussed  drug and salt interactions with lithium and especially around her diuretic medication.  She is taking torsemide which is less prone to interact than the thiazide diuretics.  Disc irritability that she acknowledges. Option Latuda or increase the lithium (if level is OK) for irritability and anger outbursts if necessary .  She wants to defer.  There is a question about whether her insurance would cover it.  Also she does not really want to add another medication because she takes so many already.  Continue duloxetine 60.   Disc risk of mood cycling.    Discussed potential benefits, risks, and side effects of stimulants with patient to include increased heart rate, palpitations, insomnia, increased anxiety, increased irritability, or decreased appetite.  Instructed patient to contact office if experiencing any significant tolerability issues.  I do not believe that the stimulant is causing irritability.  Disc low vitamin D and importance for memory and depression to get that level up to the 50s.  She is also on weekly Vitamin D.  Slow Taper Vyvanse in 2-4 week intervals at her discretion as follows: 40 mg daily, then 30 mg, then stop it. Option modafinil in place of this discussed with her.   This appt was 30 mins.   FU 2-3 mos   Lynder Parents, MD, DFAPA   Please see After Visit Summary for patient specific instructions.  Future Appointments  Date Time Provider [place]  12/21/2019  3:00 PM Parrett, Fonnie Mu, NP [facility] None  12/25/2019  2:00 PM O'Neal, Cassie Freer, MD [facility]  07/23/2020  2:00 PM Bo Merino, MD [facility] None    No orders of the defined types were placed in this encounter.     -------------------------------

## 2019-12-21 ENCOUNTER — Ambulatory Visit: Payer: Medicare Other | Admitting: Adult Health

## 2019-12-21 ENCOUNTER — Telehealth: Payer: Self-pay | Admitting: Cardiovascular Disease

## 2019-12-21 NOTE — Telephone Encounter (Signed)
Patient would like to switch her appt on 12/25/19 with Dr. Audie Box from in office to virtual. Please advise if this is ok.

## 2019-12-21 NOTE — Telephone Encounter (Signed)
Attempted to return call to patient in regards to changing appointment to virtual. Left message for patient to call back.

## 2019-12-23 NOTE — Progress Notes (Signed)
Virtual Visit via Telephone Note   This visit type was conducted due to national recommendations for restrictions regarding the COVID-19 Pandemic (e.g. social distancing) in an effort to limit this patient's exposure and mitigate transmission in our community.  Due to her co-morbid illnesses, this patient is at least at moderate risk for complications without adequate follow up.  This format is felt to be most appropriate for this patient at this time.  The patient did not have access to video technology/had technical difficulties with video requiring transitioning to audio format only (telephone).  All issues noted in this document were discussed and addressed.  No physical exam could be performed with this format.  Please refer to the patient's chart for her  consent to telehealth for [facility].   Date:  12/25/2019   ID:  [place], DOB May 22, 1950, MRN [id number] The patient was identified using 2 identifiers.  Patient Location: Home Provider Location: Office/Clinic  PCP:  Alroy Dust, L.Marlou Sa, MD  Cardiologist:  Evalina Field, MD   Evaluation Performed:  Follow-Up Visit  Chief Complaint:  HFpEF  History of Present Illness:   Jessica [name] is a 69 y.o. female with a hx of morbid obesity, HFpEF, HTN who presents for follow-up of HFpEF. Evaluated by pulmonary due to low O2. Weights have started to increase. Had to go back on O2 during day. Reports decreased exercise tolerance. CXR with pulmonary edema.  She reports she has been traveling a lot recently and had increased weight gain.  Her weight is up 1 pound from the visit with her pulmonologist.  I do have concerns for worsening heart failure.  She reports that she can only walk a short distance prior to shortness of breath.  She took torsemide 40 mg daily for a few days but symptoms have returned.  She is in [place] currently for the birthday of her grandson.  Apparently diet has been a bit hard to stick to.  She denies  any chest pain but does have significant shortness of breath.  She also reports worsening lower extremity edema.  It is a bit difficult to assess her over the phone however her weights with indicates she is gaining fluid.  Problem List: 1. HFpEF -60-65%, G1DD 2. RV Failure 2/2 OSA/OHS 3. Morbid Obesity(BMI 53) 4. HTN  The patient does not have symptoms concerning for COVID-19 infection (fever, chills, cough, or new shortness of breath).    Past Medical History:  Diagnosis Date  . Arthritis    lower back, hands, knees  . Bipolar disorder ([place])   . Cellulitis and abscess of left leg 11/29/2018  . CHF (congestive heart failure) ([place])   . Depression   . Dyspnea    with exertion  . GERD (gastroesophageal reflux disease)    patient unsure about this dx - no meds  . Headache   . History of IBS    watches diet  . Hyperlipidemia   . Hypertension   . Hypothyroidism   . Lung edema, acute, with congestive heart failure ([place])   . Obese   . Plantar fasciitis   . Seasonal allergies   . Sleep apnea 2017   uses CPAP   . Spinal stenosis of lumbar region   . SVD (spontaneous vaginal delivery)    x 2  . Systemic lupus ([place])   . Thyroid disease    Past Surgical History:  Procedure Laterality Date  . CARPAL TUNNEL RELEASE Left 09/16/2017   Procedure: LEFT CARPAL TUNNEL  RELEASE;  Surgeon: Daryll Brod, MD;  Location: [facility];  Service: Orthopedics;  Laterality: Left;  . carpel tunnel surgery Right   . CATARACT EXTRACTION Bilateral 2007   w/ lens implants  . COLONOSCOPY    . DILATION AND CURETTAGE OF UTERUS    . EYE SURGERY    . FOOT SURGERY Right    hammer toe  . FRACTURE SURGERY     R tibia and fibula  . LEG SURGERY Right   . TONSILLECTOMY    . WISDOM TOOTH EXTRACTION       No outpatient medications have been marked as taking for the 12/25/19 encounter (Telemedicine) with Geralynn Rile, MD.     Allergies:   Ibuprofen, Codeine, Nabumetone, Other, Promethazine,  Amoxicillin-pot clavulanate, Erythromycin base, and Promethazine-codeine   Social History   Tobacco Use  . Smoking status: Never Smoker  . Smokeless tobacco: Never Used  Vaping Use  . Vaping Use: Never used  Substance Use Topics  . Alcohol use: Not Currently  . Drug use: Never     Family Hx: The patient's family history includes Bipolar disorder in her father; Breast cancer in her mother; Dementia in her father; Diabetes in her father; Heart disease in her father and mother; Thyroid disease in her mother.  ROS:   Please see the history of present illness.     All other systems reviewed and are negative.   Prior CV studies:   The following studies were reviewed today:  TTE 12/02/2018 1. The left ventricle has normal systolic function with an ejection  fraction of 60-65%. The cavity size was normal. There is mild concentric  left ventricular hypertrophy. Left ventricular diastolic Doppler  parameters are consistent with impaired  relaxation. Indeterminate filling pressures.  2. The right ventricle has mildly reduced systolic function. The cavity  was mildly enlarged. There is no increase in right ventricular wall  thickness.  3. The aortic valve is tricuspid. Moderate aortic annular calcification  noted.  4. The mitral valve is grossly normal. There is mild mitral annular  calcification present.  5. The tricuspid valve is grossly normal.  6. The aorta is normal in size and structure.    Labs/Other Tests and Data Reviewed:    EKG:  No ECG reviewed.  Recent Labs: 04/16/2019: ALT 17 11/23/2019: Brain Natriuretic Peptide 12; BUN 24; Creat 0.93; Potassium 4.6; Sodium 134   Recent Lipid Panel Lab Results  Component Value Date/Time   CHOL 173 01/23/2013 11:05 AM   TRIG 271 (H) 01/23/2013 11:05 AM   HDL 51 01/23/2013 11:05 AM   CHOLHDL 3.4 01/23/2013 11:05 AM   LDLCALC 68 01/23/2013 11:05 AM    Wt Readings from Last 3 Encounters:  12/25/19 (!) 308 lb (139.7 kg)    11/23/19 (!) 307 lb 9.6 oz (139.5 kg)  09/17/19 (!) 301 lb 3.2 oz (136.6 kg)     Objective:    Vital Signs:  BP 137/75   Pulse 94   Wt (!) 308 lb (139.7 kg)   SpO2 93%   BMI 56.33 kg/m    VITAL SIGNS:  reviewed  Gen: NAD Pulm: Talking complete sentences by phone Psych: normal mood/affect  ASSESSMENT & PLAN:    1. Chronic diastolic heart failure (HCC) -wt increasing. O2 requirement going up. Needs more volume removal.  -increase torsemide to 50 mg QD. Continue K 20 mEq with torsemide.  -Hopefully we can get some fluid off.  I would like to see her in 1 month in  the office to make sure she is heading in the right direction.  She will let me know if things are not improving.  She may need to be admitted for IV diuretic therapy.  2. Essential hypertension -BP stable.   3. Obesity, morbid, BMI 50 or higher (HCC) -weight loss.    COVID-19 Education: The signs and symptoms of COVID-19 were discussed with the patient and how to seek care for testing (follow up with PCP or arrange E-visit).  The importance of social distancing was discussed today.  Time:  Today, I have spent 25 minutes with the patient with telehealth technology discussing the above problems.     Medication Adjustments/Labs and Tests Ordered: Current medicines are reviewed at length with the patient today.  Concerns regarding medicines are outlined above.   Tests Ordered: Orders Placed This Encounter  Procedures  . Basic metabolic panel    Medication Changes: Meds ordered this encounter  Medications  . torsemide (DEMADEX) 100 MG tablet    Sig: Take 0.5 tablets (50 mg total) by mouth daily.    Dispense:  90 tablet    Refill:  1  . potassium chloride SA (KLOR-CON) 20 MEQ tablet    Sig: Take 1 tablet by mouth daily with Torsemide    Dispense:  90 tablet    Refill:  1    Follow Up:  In Person 3 months  Signed, Evalina Field, MD  12/25/2019 2:35 PM    [place]

## 2019-12-24 NOTE — Telephone Encounter (Signed)
Patient returned call, please call her back at [phone].  Patient is in [facility], she needs to know ASAP.

## 2019-12-24 NOTE — Telephone Encounter (Signed)
Yes virtual is fine. -W

## 2019-12-24 NOTE — Telephone Encounter (Signed)
Pt aware and appt changed to virtual visit /cy

## 2019-12-25 ENCOUNTER — Telehealth (INDEPENDENT_AMBULATORY_CARE_PROVIDER_SITE_OTHER): Payer: Medicare Other | Admitting: Cardiovascular Disease

## 2019-12-25 ENCOUNTER — Encounter: Payer: Self-pay | Admitting: Cardiovascular Disease

## 2019-12-25 VITALS — BP 137/75 | HR 94 | Wt 308.0 lb

## 2019-12-25 DIAGNOSIS — I10 Essential (primary) hypertension: Secondary | ICD-10-CM | POA: Diagnosis not present

## 2019-12-25 DIAGNOSIS — I5032 Chronic diastolic (congestive) heart failure: Secondary | ICD-10-CM | POA: Diagnosis not present

## 2019-12-25 MED ORDER — TORSEMIDE 100 MG PO TABS: 50.0000 mg | ORAL_TABLET | Freq: Every day | ORAL | 1 refills | Status: DC

## 2019-12-25 MED ORDER — POTASSIUM CHLORIDE CRYS ER 20 MEQ PO TBCR: EXTENDED_RELEASE_TABLET | ORAL | 1 refills | Status: DC

## 2019-12-25 NOTE — Patient Instructions (Signed)
Medication Instructions:  Start Torsemide 50 mg (take 0.5 tablet) daily  Take Potassium 20 meq daily   *If you need a refill on your cardiac medications before your next appointment, please call your pharmacy*   Lab Work: BMET (next week, no lab appointment needed, come when available) If you have labs (blood work) drawn today and your tests are completely normal, you will receive your results only by: Marland Kitchen MyChart Message (if you have MyChart) OR . A paper copy in the mail If you have any lab test that is abnormal or we need to change your treatment, we will call you to review the results.   Follow-Up: At [facility], you and your health needs are our priority.  As part of our continuing mission to provide you with exceptional heart care, we have created designated Provider Care Teams.  These Care Teams include your primary Cardiologist (physician) and Advanced Practice Providers (APPs -  Physician Assistants and Nurse Practitioners) who all work together to provide you with the care you need, when you need it.  We recommend signing up for the patient portal called "MyChart".  Sign up information is provided on this After Visit Summary.  MyChart is used to connect with patients for Virtual Visits (Telemedicine).  Patients are able to view lab/test results, encounter notes, upcoming appointments, etc.  Non-urgent messages can be sent to your provider as well.   To learn more about what you can do with MyChart, go to [email or web address].    Your next appointment:   1 month(s)  The format for your next appointment:   In Person  Provider:   Eleonore Chiquito, MD

## 2019-12-28 ENCOUNTER — Telehealth: Payer: Self-pay | Admitting: Psychiatry

## 2019-12-28 NOTE — Telephone Encounter (Signed)
Patient returned call. She was made aware. Will call back if she has any more questions,

## 2019-12-28 NOTE — Telephone Encounter (Signed)
San called because she has two medication changes to make.  You have changed the dose of her Vyvanse and her cardiologist has changed her torsemide from 20mg  to 40mg .  Mirah wants to know if she should make both changes at the same time or space them out.  Also should there be an adjustment to her Lithium due to the change in her torsemide?  Please call to discuss

## 2019-12-28 NOTE — Telephone Encounter (Signed)
Ok to make both med changes together.  We  determined that torsemide will not significantly affect lithium levels unless it causes large weight loss, like 20# of fluid.  Otherwise no change in lithium level needed.

## 2019-12-28 NOTE — Telephone Encounter (Signed)
Left her a vm to return my call.

## 2020-01-03 ENCOUNTER — Telehealth: Payer: Self-pay | Admitting: Cardiovascular Disease

## 2020-01-03 DIAGNOSIS — R9389 Abnormal findings on diagnostic imaging of other specified body structures: Secondary | ICD-10-CM | POA: Diagnosis not present

## 2020-01-03 DIAGNOSIS — N950 Postmenopausal bleeding: Secondary | ICD-10-CM | POA: Diagnosis not present

## 2020-01-03 MED ORDER — TORSEMIDE 20 MG PO TABS: 20.0000 mg | ORAL_TABLET | Freq: Two times a day (BID) | ORAL | 11 refills | Status: DC

## 2020-01-03 NOTE — Telephone Encounter (Signed)
Pt c/o medication issue:  1. Name of Medication: Torsemide   2. How are you currently taking this medication (dosage and times per day)?  1 tablet daily  3. Are you having a reaction (difficulty breathing--STAT)? At times  4. What is your medication issue? Not sleeping well and resting well -urinating more at one time- need to change how she is taking the Torsemide, also her oxygen level is going down to 89 at times   She said she would appreciate if you could call before 3:15 today, she have another doctor appt today

## 2020-01-03 NOTE — Telephone Encounter (Signed)
We will have to give her 20 mg tablets to take twice daily. I do fear she has a lot of fluid on board. If sats continue to remain low, would recommend ER evaluation.   [place] T. Audie Box, [place]  [address], [place], [place] [address] [phone]  4:16 PM

## 2020-01-03 NOTE — Telephone Encounter (Signed)
Per pt has noted since increasing the Torsemide to 50 mg seems that has interrupted sleep Pt was wandering if could split up the dose and do Torsemide  25 mg bid Per pt weight as of yesterday was 306 . Also pt notes O2 ranging 90-91% and as low as 89%.Encouraged pt to contact pulmonary re O2 complaints Will forward to Dr Marisue Ivan for review and recommendations ./cy

## 2020-01-03 NOTE — Telephone Encounter (Signed)
Pt aware and agrees with plan ./cy ?

## 2020-01-08 ENCOUNTER — Ambulatory Visit (INDEPENDENT_AMBULATORY_CARE_PROVIDER_SITE_OTHER): Payer: Medicare Other | Admitting: Adult Health

## 2020-01-08 ENCOUNTER — Encounter (HOSPITAL_COMMUNITY): Payer: Self-pay | Admitting: Emergency Medicine

## 2020-01-08 ENCOUNTER — Other Ambulatory Visit: Payer: Self-pay

## 2020-01-08 ENCOUNTER — Emergency Department (HOSPITAL_COMMUNITY)
Admission: EM | Admit: 2020-01-08 | Discharge: 2020-01-08 | Disposition: A | Discharge status: Home / Self Care | Payer: Medicare Other | Attending: Emergency Medicine | Admitting: Emergency Medicine

## 2020-01-08 ENCOUNTER — Encounter: Payer: Self-pay | Admitting: Adult Health

## 2020-01-08 ENCOUNTER — Ambulatory Visit: Payer: Medicare Other | Admitting: Adult Health

## 2020-01-08 ENCOUNTER — Emergency Department (HOSPITAL_COMMUNITY): Payer: Medicare Other

## 2020-01-08 DIAGNOSIS — Y92410 Unspecified street and highway as the place of occurrence of the external cause: Secondary | ICD-10-CM | POA: Diagnosis not present

## 2020-01-08 DIAGNOSIS — R079 Chest pain, unspecified: Secondary | ICD-10-CM | POA: Diagnosis not present

## 2020-01-08 DIAGNOSIS — Y939 Activity, unspecified: Secondary | ICD-10-CM | POA: Diagnosis not present

## 2020-01-08 DIAGNOSIS — Y999 Unspecified external cause status: Secondary | ICD-10-CM | POA: Diagnosis not present

## 2020-01-08 DIAGNOSIS — J9611 Chronic respiratory failure with hypoxia: Secondary | ICD-10-CM

## 2020-01-08 DIAGNOSIS — Z5321 Procedure and treatment not carried out due to patient leaving prior to being seen by health care provider: Secondary | ICD-10-CM | POA: Insufficient documentation

## 2020-01-08 DIAGNOSIS — I5032 Chronic diastolic (congestive) heart failure: Secondary | ICD-10-CM | POA: Diagnosis not present

## 2020-01-08 DIAGNOSIS — G4733 Obstructive sleep apnea (adult) (pediatric): Secondary | ICD-10-CM | POA: Diagnosis not present

## 2020-01-08 NOTE — Progress Notes (Signed)
@Patient  ID: Jessica [name], female    DOB: 05/22/1950, 69 y.o.   MRN: [id number]  Chief Complaint  Patient presents with  . Follow-up    Referring provider: Alroy Dust, L.Marlou Sa, MD  HPI: 70 year old female never smoker followed for hypoxic respiratory failure on oxygen and diastolic heart failure Obstructive sleep apnea followed by sleep medicine at [facility] history significant for spinal stenosis, lupus and bipolar disorder  TEST/EVENTS :  2D echo August 2020 EF 7124% with diastolic dysfunction  09/07/996 Follow up : Chronic respiratory failure and diastolic heart failure Patient returns for a 6-week follow-up.  Last visit was having increased lower extremity swelling.  Patient was recommended to increase her torsemide to 40 mg daily for 3 days.  Recommended for follow-up with cardiology.  Chest x-ray showed some increased vascular congestion.  BNP was normal.  Patient says since last visit she is feeling some better.  Legs are not as swollen.  She has been seen by cardiology with recommendations to remain on torsemide 20 mg twice daily. Patient says her breathing is slightly better.  She remains on oxygen 2 L with activity.  Says that she has felt like she has not had to use this as much.  Allergies  Allergen Reactions  . Ibuprofen Diarrhea, Nausea Only and Other (See Comments)    Extreme stomach pain  Makes her feel bad  . Codeine     UNSPECIFIED REACTION   . Nabumetone     UNSPECIFIED REACTION   . Other     Seasonal allergies  . Promethazine     UNSPECIFIED REACTION   . Amoxicillin-Pot Clavulanate Nausea And Vomiting  . Erythromycin Base Diarrhea  . Promethazine-Codeine Other (See Comments)    "Makes her feel bad"    Immunization History  Administered Date(s) Administered  . Influenza, High Dose Seasonal PF 02/12/2019  . PFIZER SARS-COV-2 Vaccination 06/24/2019, 07/16/2019    Past Medical History:  Diagnosis Date  .  Arthritis    lower back, hands, knees  . Bipolar disorder ([place])   . Cellulitis and abscess of left leg 11/29/2018  . CHF (congestive heart failure) ([place])   . Depression   . Dyspnea    with exertion  . GERD (gastroesophageal reflux disease)    patient unsure about this dx - no meds  . Headache   . History of IBS    watches diet  . Hyperlipidemia   . Hypertension   . Hypothyroidism   . Lung edema, acute, with congestive heart failure ([place])   . Obese   . Plantar fasciitis   . Seasonal allergies   . Sleep apnea 2017   uses CPAP   . Spinal stenosis of lumbar region   . SVD (spontaneous vaginal delivery)    x 2  . Systemic lupus ([place])   . Thyroid disease     Tobacco History: Social History   Tobacco Use  Smoking Status Never Smoker  Smokeless Tobacco Never Used   Counseling given: Not Answered   Outpatient Medications Prior to Visit  Medication Sig Dispense Refill  . acetaminophen (TYLENOL) 650 MG CR tablet Take 650 mg by mouth daily.     . Acetylcysteine 600 MG CAPS Take 600 mg by mouth 2 (two) times daily. NAC    . amLODipine (NORVASC) 10 MG tablet Take 1 tablet (10 mg total) by mouth daily. 60 tablet 0  . Carboxymeth-Glycerin-Polysorb (REFRESH OPTIVE ADVANCED OP) Apply to eye.    . cycloSPORINE (  RESTASIS) 0.05 % ophthalmic emulsion Place 1 drop into both eyes 2 (two) times daily.    . DULoxetine (CYMBALTA) 60 MG capsule Take 1 capsule (60 mg total) by mouth daily. 90 capsule 0  . econazole nitrate 1 % cream Apply topically daily.    Marland Kitchen gabapentin (NEURONTIN) 300 MG capsule 1 in AM and 3 at evening 360 capsule 1  . levothyroxine (SYNTHROID) 25 MCG tablet Take 25 mcg by mouth daily before breakfast.     . lisdexamfetamine (VYVANSE) 40 MG capsule Take 1 capsule (40 mg total) by mouth every morning. 30 capsule 0  . LORazepam (ATIVAN) 0.5 MG tablet Take 1 tablet (0.5 mg total) by mouth 2 (two) times daily as needed for anxiety. 10 tablet 0  . metoprolol tartrate (LOPRESSOR)  25 MG tablet Take 0.5 tablets (12.5 mg total) by mouth 2 (two) times daily. 60 tablet 0  . Multiple Vitamins-Minerals (MULTIVITAMIN WITH MINERALS) tablet Take 1 tablet by mouth daily.    . Multiple Vitamins-Minerals (PRESERVISION AREDS 2) CAPS Take 1 capsule 2 (two) times daily by mouth.    . potassium chloride SA (KLOR-CON) 20 MEQ tablet Take 1 tablet by mouth daily with Torsemide 90 tablet 1  . pravastatin (PRAVACHOL) 20 MG tablet Take 20 mg at bedtime by mouth.     . SYNTHROID 200 MCG tablet TAKE 1 TABLET BY MOUTH EVERY MORNING ON AN EMPTY STOMACH    . Thiamine HCl (VITAMIN B1) 100 MG TABS Take 1 tablet by mouth daily.    Marland Kitchen torsemide (DEMADEX) 20 MG tablet Take 1 tablet (20 mg total) by mouth 2 (two) times daily. 60 tablet 11  . triamcinolone cream (KENALOG) 0.5 % APPLY SPARINGLY EXTERNALLY TO THE AFFECTED AREA TWICE A DAY    . vitamin B-12 (CYANOCOBALAMIN) 100 MCG tablet Take 100 mcg by mouth 2 (two) times daily.    . Vitamin D, Ergocalciferol, (DRISDOL) 1.25 MG (50000 UNIT) CAPS capsule TAKE 1 CAPSULE (50,000 UNITS TOTAL) BY MOUTH EVERY 7 (SEVEN) DAYS. 12 capsule 2  . lithium carbonate 150 MG capsule Take 5 capsules (750 mg total) by mouth at bedtime. 450 capsule 0   No facility-administered medications prior to visit.     Review of Systems:   Constitutional:   No  weight loss, night sweats,  Fevers, chills, +fatigue, or  lassitude.  HEENT:   No headaches,  Difficulty swallowing,  Tooth/dental problems, or  Sore throat,                No sneezing, itching, ear ache, nasal congestion, post nasal drip,   CV:  No chest pain,  Orthopnea, PND, +swelling in lower extremities, no anasarca, dizziness, palpitations, syncope.   GI  No heartburn, indigestion, abdominal pain, nausea, vomiting, diarrhea, change in bowel habits, loss of appetite, bloody stools.   Resp:  .  No chest wall deformity  Skin: no rash or lesions.  GU: no dysuria, change in color of urine, no urgency or frequency.  No  flank pain, no hematuria   MS:  No joint pain or swelling.  No decreased range of motion.  No back pain.    Physical Exam  BP (!) 142/82 (BP Location: Left Arm, Cuff Size: Large)   Pulse (!) 115   Temp 98 F (36.7 C) (Temporal)   Ht 5\' 2"  (1.575 m)   Wt (!) 310 lb 9.6 oz (140.9 kg)   SpO2 91% Comment: RA  BMI 56.81 kg/m   GEN: A/Ox3; pleasant , NAD,  BMI 56   HEENT:  [place]/AT,   NOSE-clear, THROAT-clear, no lesions, no postnasal drip or exudate noted.   NECK:  Supple w/ fair ROM; no JVD; normal carotid impulses w/o bruits; no thyromegaly or nodules palpated; no lymphadenopathy.    RESP  Clear  P & A; w/o, wheezes/ rales/ or rhonchi. no accessory muscle use, no dullness to percussion  CARD:  RRR, no m/r/g, 1-2 + peripheral edema, pulses intact, no cyanosis or clubbing.  GI:   Soft & nt; nml bowel sounds; no organomegaly or masses detected.   Musco: Warm bil, no deformities or joint swelling noted.   Neuro: alert, no focal deficits noted.    Skin: Warm, no lesions or rashes    Lab Results:    ProBNP No results found for: PROBNP  Imaging: No results found.    PFT Results Latest Ref Rng & Units 01/29/2019  FVC-Pre L 0.61  FVC-Predicted Pre % 21  Pre FEV1/FVC % % 100  FEV1-Pre L 0.61  FEV1-Predicted Pre % 28    No results found for: NITRICOXIDE      Assessment & Plan:   No problem-specific Assessment & Plan notes found for this encounter.     Rexene Edison, NP 01/08/2020

## 2020-01-08 NOTE — ED Notes (Signed)
Pt and husband check in said they could no longer wait. Pt and husband was encourage to stay.

## 2020-01-08 NOTE — Patient Instructions (Addendum)
Continue on Torsemide per cardiology .  Low salt diet.  Continue on Oxygen 2l/m with activity as needed.  , to Keep Oxygen level >88-90%.  Continue on CPAP At bedtime with oxygen .  Follow up with Dr. Loanne Drilling in 3 months and As Needed   Please contact office for sooner follow up if symptoms do not improve or worsen or seek emergency care
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
## 2020-01-09 ENCOUNTER — Ambulatory Visit: Payer: Medicare Other | Admitting: Pulmonary Disease

## 2020-01-10 NOTE — Assessment & Plan Note (Signed)
Continue with oxygen 2 L with activity and at bedtime with CPAP.

## 2020-01-10 NOTE — Assessment & Plan Note (Signed)
Continue on nocturnal CPAP. 

## 2020-01-10 NOTE — Assessment & Plan Note (Signed)
Improved on current diuretics.  Continue with low-salt diet  Plan  Patient Instructions  Continue on Torsemide per cardiology .  Low salt diet.  Continue on Oxygen 2l/m with activity as needed.  , to Keep Oxygen level >88-90%.  Continue on CPAP At bedtime with oxygen .  Follow up with Dr. Loanne Drilling in 3 months and As Needed   Please contact office for sooner follow up if symptoms do not improve or worsen or seek emergency care

## 2020-01-11 ENCOUNTER — Other Ambulatory Visit: Payer: Self-pay | Admitting: Psychiatry

## 2020-01-11 DIAGNOSIS — F319 Bipolar disorder, unspecified: Secondary | ICD-10-CM

## 2020-01-14 ENCOUNTER — Encounter: Payer: Self-pay | Admitting: Psychiatry

## 2020-01-14 ENCOUNTER — Telehealth (INDEPENDENT_AMBULATORY_CARE_PROVIDER_SITE_OTHER): Payer: Medicare Other | Admitting: Psychiatry

## 2020-01-14 DIAGNOSIS — G4733 Obstructive sleep apnea (adult) (pediatric): Secondary | ICD-10-CM

## 2020-01-14 DIAGNOSIS — R7989 Other specified abnormal findings of blood chemistry: Secondary | ICD-10-CM

## 2020-01-14 DIAGNOSIS — F319 Bipolar disorder, unspecified: Secondary | ICD-10-CM | POA: Diagnosis not present

## 2020-01-14 DIAGNOSIS — F900 Attention-deficit hyperactivity disorder, predominantly inattentive type: Secondary | ICD-10-CM | POA: Diagnosis not present

## 2020-01-14 DIAGNOSIS — F411 Generalized anxiety disorder: Secondary | ICD-10-CM | POA: Diagnosis not present
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
## 2020-01-15 DIAGNOSIS — H11423 Conjunctival edema, bilateral: Secondary | ICD-10-CM | POA: Diagnosis not present

## 2020-01-15 DIAGNOSIS — H04123 Dry eye syndrome of bilateral lacrimal glands: Secondary | ICD-10-CM | POA: Diagnosis not present

## 2020-01-15 DIAGNOSIS — H353132 Nonexudative age-related macular degeneration, bilateral, intermediate dry stage: Secondary | ICD-10-CM | POA: Diagnosis not present

## 2020-01-15 DIAGNOSIS — H16223 Keratoconjunctivitis sicca, not specified as Sjogren's, bilateral: Secondary | ICD-10-CM | POA: Diagnosis not present

## 2020-01-15 DIAGNOSIS — H11823 Conjunctivochalasis, bilateral: Secondary | ICD-10-CM | POA: Diagnosis not present

## 2020-01-18 DIAGNOSIS — H11823 Conjunctivochalasis, bilateral: Secondary | ICD-10-CM | POA: Diagnosis not present

## 2020-01-18 DIAGNOSIS — Z79899 Other long term (current) drug therapy: Secondary | ICD-10-CM | POA: Diagnosis not present

## 2020-01-18 DIAGNOSIS — G4710 Hypersomnia, unspecified: Secondary | ICD-10-CM | POA: Diagnosis not present

## 2020-01-18 DIAGNOSIS — G4733 Obstructive sleep apnea (adult) (pediatric): Secondary | ICD-10-CM | POA: Diagnosis not present

## 2020-01-18 DIAGNOSIS — H04123 Dry eye syndrome of bilateral lacrimal glands: Secondary | ICD-10-CM | POA: Diagnosis not present

## 2020-01-18 DIAGNOSIS — Z9989 Dependence on other enabling machines and devices: Secondary | ICD-10-CM | POA: Diagnosis not present

## 2020-01-18 DIAGNOSIS — Z961 Presence of intraocular lens: Secondary | ICD-10-CM | POA: Diagnosis not present

## 2020-01-18 DIAGNOSIS — I5032 Chronic diastolic (congestive) heart failure: Secondary | ICD-10-CM | POA: Diagnosis not present

## 2020-01-18 DIAGNOSIS — Z6841 Body Mass Index (BMI) 40.0 and over, adult: Secondary | ICD-10-CM | POA: Diagnosis not present

## 2020-01-18 DIAGNOSIS — R5383 Other fatigue: Secondary | ICD-10-CM | POA: Diagnosis not present

## 2020-01-18 DIAGNOSIS — I110 Hypertensive heart disease with heart failure: Secondary | ICD-10-CM | POA: Diagnosis not present

## 2020-01-18 DIAGNOSIS — H11423 Conjunctival edema, bilateral: Secondary | ICD-10-CM | POA: Diagnosis not present

## 2020-01-18 DIAGNOSIS — G4719 Other hypersomnia: Secondary | ICD-10-CM | POA: Diagnosis not present

## 2020-01-23 ENCOUNTER — Telehealth: Payer: Self-pay | Admitting: Psychiatry

## 2020-01-23 ENCOUNTER — Other Ambulatory Visit: Payer: Self-pay

## 2020-01-23 DIAGNOSIS — F900 Attention-deficit hyperactivity disorder, predominantly inattentive type: Secondary | ICD-10-CM

## 2020-01-23 MED ORDER — LISDEXAMFETAMINE DIMESYLATE 30 MG PO CAPS: 30.0000 mg | ORAL_CAPSULE | ORAL | 0 refills | Status: DC

## 2020-01-23 NOTE — Telephone Encounter (Signed)
Patient requesting next lower dose of her Vyvanse. Will pend the 30 mg for Dr. Clovis Pu to send. See office note as patient tapers down.

## 2020-01-23 NOTE — Telephone Encounter (Signed)
Jessica [name] called and requested a prescription for the next lower dose of the Vyvanse.  She was thinking it would be 20mg .  She is at 40mg  now.  [facility] [address]

## 2020-01-25 ENCOUNTER — Encounter: Payer: Self-pay | Admitting: Cardiology

## 2020-01-25 ENCOUNTER — Other Ambulatory Visit: Payer: Self-pay

## 2020-01-25 ENCOUNTER — Ambulatory Visit (INDEPENDENT_AMBULATORY_CARE_PROVIDER_SITE_OTHER): Payer: Medicare Other | Admitting: Cardiology

## 2020-01-25 VITALS — BP 118/64 | HR 92 | Temp 97.2°F | Resp 16 | Ht 62.0 in | Wt 307.6 lb

## 2020-01-25 DIAGNOSIS — I5032 Chronic diastolic (congestive) heart failure: Secondary | ICD-10-CM

## 2020-01-25 DIAGNOSIS — I10 Essential (primary) hypertension: Secondary | ICD-10-CM | POA: Diagnosis not present

## 2020-01-25 DIAGNOSIS — G4733 Obstructive sleep apnea (adult) (pediatric): Secondary | ICD-10-CM

## 2020-01-25 DIAGNOSIS — Z8659 Personal history of other mental and behavioral disorders: Secondary | ICD-10-CM

## 2020-01-25 DIAGNOSIS — J9611 Chronic respiratory failure with hypoxia: Secondary | ICD-10-CM | POA: Diagnosis not present

## 2020-01-25 MED ORDER — AMLODIPINE BESYLATE 5 MG PO TABS
5.0000 mg | ORAL_TABLET | Freq: Every day | ORAL | 1 refills | Status: DC
Start: 2020-01-25 — End: 2020-01-29

## 2020-01-25 MED ORDER — TORSEMIDE 20 MG PO TABS: ORAL_TABLET | ORAL | 3 refills | Status: DC

## 2020-01-25 NOTE — Assessment & Plan Note (Signed)
BMI 56 

## 2020-01-25 NOTE — Patient Instructions (Signed)
Medication Instructions:   DECREASE Amlodipine to 5 mg daily  CHANGE: Torsemide---take 40 mg (2 tab) in the morning and 20 mg (1 tab) in the afternoon until weight is less than 290lb. Then take 20 mg (1 tab) twice daily.  *If you need a refill on your cardiac medications before your next appointment, please call your pharmacy*   Lab Work: Your physician recommends that you return for lab work today: BMET, BNP  If you have labs (blood work) drawn today and your tests are completely normal, you will receive your results only by: Marland Kitchen MyChart Message (if you have MyChart) OR . A paper copy in the mail If you have any lab test that is abnormal or we need to change your treatment, we will call you to review the results.   Follow-Up: At [facility], you and your health needs are our priority.  As part of our continuing mission to provide you with exceptional heart care, we have created designated Provider Care Teams.  These Care Teams include your primary Cardiologist (physician) and Advanced Practice Providers (APPs -  Physician Assistants and Nurse Practitioners) who all work together to provide you with the care you need, when you need it.  We recommend signing up for the patient portal called "MyChart".  Sign up information is provided on this After Visit Summary.  MyChart is used to connect with patients for Virtual Visits (Telemedicine).  Patients are able to view lab/test results, encounter notes, upcoming appointments, etc.  Non-urgent messages can be sent to your provider as well.   To learn more about what you can do with MyChart, go to [email or web address].    Your next appointment:   Tuesday, 02/19/20 at 3:20 PM  The format for your next appointment:   In Person  Provider:   Eleonore Chiquito, MD

## 2020-01-25 NOTE — Progress Notes (Signed)
Cardiology Office Note:    Date:  01/25/2020   ID:  Jessica [name], DOB 04-28-1951, MRN [id number]  PCP:  Alroy Dust, Carlean Jews.Marlou Sa, MD  Cardiologist:  Evalina Field, MD  Electrophysiologist:  None   Referring MD: Aurea Graff.Marlou Sa, MD   CC: edema - though its improving  History of Present Illness:    Jessica [name] is a morbidly obese 69 y.o. female with a hx of chronic respiratory failure on O2, HTN, diastolic CHF, untreated sleep apnea, and bipolar disorder.  She is seen in the office today for follow up.  Dr. Davina Poke saw her in August and was concerned she was volume overloaded.  The patient tells me that her "good weight" is between 285 and 290 pounds.  When Dr. Davina Poke saw her she was 308 pounds.  He asked her to increase her Demadex to 50 mg a day.  The patient says she is unable to tolerate that dose.  She says she gets dizzy if she takes more than 20 mg.  Even at 20 mg she says she gets dizzy when she stands up.  Currently she is on Demadex 20 mg twice a day.  She admits that her edema is better.  She is not any more short of breath than usual at rest.  She recently saw pulmonary who thought she was stable.  They suggest shooting for an O2 sat between 88 and 90.  Past Medical History:  Diagnosis Date  . Arthritis    lower back, hands, knees  . Bipolar disorder ([place])   . Cellulitis and abscess of left leg 11/29/2018  . CHF (congestive heart failure) ([place])   . Depression   . Dyspnea    with exertion  . GERD (gastroesophageal reflux disease)    patient unsure about this dx - no meds  . Headache   . History of IBS    watches diet  . Hyperlipidemia   . Hypertension   . Hypothyroidism   . Lung edema, acute, with congestive heart failure ([place])   . Obese   . Plantar fasciitis   . Seasonal allergies   . Sleep apnea 2017   uses CPAP   . Spinal stenosis of lumbar region   . SVD (spontaneous vaginal delivery)    x 2  . Systemic lupus ([place])   . Thyroid disease      Past Surgical History:  Procedure Laterality Date  . CARPAL TUNNEL RELEASE Left 09/16/2017   Procedure: LEFT CARPAL TUNNEL RELEASE;  Surgeon: Daryll Brod, MD;  Location: [facility];  Service: Orthopedics;  Laterality: Left;  . carpel tunnel surgery Right   . CATARACT EXTRACTION Bilateral 2007   w/ lens implants  . COLONOSCOPY    . DILATION AND CURETTAGE OF UTERUS    . EYE SURGERY    . FOOT SURGERY Right    hammer toe  . FRACTURE SURGERY     R tibia and fibula  . LEG SURGERY Right   . TONSILLECTOMY    . WISDOM TOOTH EXTRACTION      Current Medications: Current Meds  Medication Sig  . acetaminophen (TYLENOL) 650 MG CR tablet Take 650 mg by mouth daily.   . Acetylcysteine 600 MG CAPS Take 600 mg by mouth 2 (two) times daily. NAC  . amLODipine (NORVASC) 5 MG tablet Take 1 tablet (5 mg total) by mouth daily.  . Carboxymeth-Glycerin-Polysorb (REFRESH OPTIVE ADVANCED OP) Apply to eye.  . cycloSPORINE (RESTASIS) 0.05 % ophthalmic emulsion Place 1 drop into  both eyes 2 (two) times daily.  . DULoxetine (CYMBALTA) 60 MG capsule Take 1 capsule (60 mg total) by mouth daily.  Marland Kitchen econazole nitrate 1 % cream Apply topically daily.  Marland Kitchen gabapentin (NEURONTIN) 300 MG capsule 1 in AM and 3 at evening  . levothyroxine (SYNTHROID) 25 MCG tablet Take 25 mcg by mouth daily before breakfast.   . lisdexamfetamine (VYVANSE) 30 MG capsule Take 1 capsule (30 mg total) by mouth every morning.  . lithium carbonate 150 MG capsule TAKE 5 CAPSULES (750 MG TOTAL) BY MOUTH AT BEDTIME.  . LORazepam (ATIVAN) 0.5 MG tablet Take 1 tablet (0.5 mg total) by mouth 2 (two) times daily as needed for anxiety.  . metoprolol tartrate (LOPRESSOR) 25 MG tablet Take 0.5 tablets (12.5 mg total) by mouth 2 (two) times daily.  . Multiple Vitamins-Minerals (MULTIVITAMIN WITH MINERALS) tablet Take 1 tablet by mouth daily.  . Multiple Vitamins-Minerals (PRESERVISION AREDS 2) CAPS Take 1 capsule 2 (two) times daily by mouth.  . potassium  chloride SA (KLOR-CON) 20 MEQ tablet Take 1 tablet by mouth daily with Torsemide  . pravastatin (PRAVACHOL) 20 MG tablet Take 20 mg at bedtime by mouth.   . SYNTHROID 200 MCG tablet TAKE 1 TABLET BY MOUTH EVERY MORNING ON AN EMPTY STOMACH  . Thiamine HCl (VITAMIN B1) 100 MG TABS Take 1 tablet by mouth daily.  Marland Kitchen torsemide (DEMADEX) 20 MG tablet Take 2 tab (40 mg) in the morning and 1 tab (20 mg) in the afternoon until weight is less than 290lb. Then take 1 tab (20 mg) twice daily.  . vitamin B-12 (CYANOCOBALAMIN) 100 MCG tablet Take 100 mcg by mouth 2 (two) times daily.  . Vitamin D, Ergocalciferol, (DRISDOL) 1.25 MG (50000 UNIT) CAPS capsule TAKE 1 CAPSULE (50,000 UNITS TOTAL) BY MOUTH EVERY 7 (SEVEN) DAYS.  . [DISCONTINUED] amLODipine (NORVASC) 10 MG tablet Take 1 tablet (10 mg total) by mouth daily.  . [DISCONTINUED] torsemide (DEMADEX) 20 MG tablet Take 1 tablet (20 mg total) by mouth 2 (two) times daily. (Patient taking differently: Take 40 mg by mouth 2 (two) times daily. )  . [DISCONTINUED] triamcinolone cream (KENALOG) 0.5 % APPLY SPARINGLY EXTERNALLY TO THE AFFECTED AREA TWICE A DAY     Allergies:   Ibuprofen, Codeine, Nabumetone, Other, Promethazine, Amoxicillin-pot clavulanate, Erythromycin base, and Promethazine-codeine   Social History   Socioeconomic History  . Marital status: Married    Spouse name: Not on file  . Number of children: Not on file  . Years of education: Not on file  . Highest education level: Not on file  Occupational History  . Not on file  Tobacco Use  . Smoking status: Never Smoker  . Smokeless tobacco: Never Used  Vaping Use  . Vaping Use: Never used  Substance and Sexual Activity  . Alcohol use: Not Currently  . Drug use: Never  . Sexual activity: Not on file  Other Topics Concern  . Not on file  Social History Narrative   She lives in a single level home with her husband.  She has two grown children.   She taught college accounting courses,  retired in 2005.    Highest level of education:  Designer, jewellery in [employer]   Social Determinants of Health   Financial Resource Strain:   . Difficulty of Paying Living Expenses: Not on file  Food Insecurity:   . Worried About Charity fundraiser in the Last Year: Not on file  . Ran Out of Food in  the Last Year: Not on file  Transportation Needs:   . Lack of Transportation (Medical): Not on file  . Lack of Transportation (Non-Medical): Not on file  Physical Activity:   . Days of Exercise per Week: Not on file  . Minutes of Exercise per Session: Not on file  Stress:   . Feeling of Stress : Not on file  Social Connections:   . Frequency of Communication with Friends and Family: Not on file  . Frequency of Social Gatherings with Friends and Family: Not on file  . Attends Religious Services: Not on file  . Active Member of Clubs or Organizations: Not on file  . Attends Archivist Meetings: Not on file  . Marital Status: Not on file     Family History: The patient's family history includes Bipolar disorder in her father; Breast cancer in her mother; Dementia in her father; Diabetes in her father; Heart disease in her father and mother; Thyroid disease in her mother.  ROS:   Please see the history of present illness.     All other systems reviewed and are negative.  EKGs/Labs/Other Studies Reviewed:    The following studies were reviewed today  Echo 12/02/2018- IMPRESSIONS   1. The left ventricle has normal systolic function with an ejection  fraction of 60-65%. The cavity size was normal. There is mild concentric  left ventricular hypertrophy. Left ventricular diastolic Doppler  parameters are consistent with impaired  relaxation. Indeterminate filling pressures.  2. The right ventricle has mildly reduced systolic function. The cavity  was mildly enlarged. There is no increase in right ventricular wall  thickness.  3. The aortic valve is tricuspid. Moderate aortic  annular calcification  noted.  4. The mitral valve is grossly normal. There is mild mitral annular  calcification present.  5. The tricuspid valve is grossly normal.  6. The aorta is normal in size and structure.   EKG:  EKG is not ordered today.  The ekg ordered 09/17/2019 demonstrates NSR, low voltage, poor anterior RW  Recent Labs: 04/16/2019: ALT 17 11/23/2019: Brain Natriuretic Peptide 12; BUN 24; Creat 0.93; Potassium 4.6; Sodium 134  Recent Lipid Panel    Component Value Date/Time   CHOL 173 01/23/2013 1105   TRIG 271 (H) 01/23/2013 1105   HDL 51 01/23/2013 1105   CHOLHDL 3.4 01/23/2013 1105   VLDL 54 (H) 01/23/2013 1105   LDLCALC 68 01/23/2013 1105    Physical Exam:    VS:  BP 118/64   Pulse 92   Temp (!) 97.2 F (36.2 C)   Resp 16   Ht 5\' 2"  (1.575 m)   Wt (!) 307 lb 9.6 oz (139.5 kg)   SpO2 90%   BMI 56.26 kg/m     Wt Readings from Last 3 Encounters:  01/25/20 (!) 307 lb 9.6 oz (139.5 kg)  01/08/20 (!) 310 lb 9.6 oz (140.9 kg)  12/25/19 (!) 308 lb (139.7 kg)     GEN: Morbidly obese Caucasian female ina wheelchair,  in no acute distress HEENT: Normal NECK: No JVD; No carotid bruits CARDIAC: RRR, no murmurs, rubs, gallops RESPIRATORY:  Clear to auscultation without rales, wheezing or rhonchi  ABDOMEN: obese, large panus, non-tender, non-distended MUSCULOSKELETAL:  1-2+ chronic LE  edema; No deformity  SKIN: Warm and dry NEUROLOGIC:  Alert and oriented x 3 PSYCHIATRIC:  Normal affect   ASSESSMENT:    Chronic diastolic heart failure (HCC) EF 60-65% by echo Aug 2020, grade 1 DD  Chronic hypoxemic  respiratory failure ([place]) Secondary to obesity, OSA- RVD on echo  Morbid obesity due to excess calories (HCC) BMI 56  OSA (obstructive sleep apnea) She has seen pulmonary- never followed through with sleep study  Essential hypertension Controlled  History of bipolar disorder Followed by psychiatry- on multiple medications  PLAN:    I suggested  she try Torsemide 40 mg in am, 20 mg in PM and I suggested she decrease her Amlodipine to 5 mg daily.  Once her weight gets to 290 lbs she can cut back to torsemide 20 mg BID.  Check BMP and BNP today, her last BNP was normal but that was when she was felt to be volume stable.    Medication Adjustments/Labs and Tests Ordered: Current medicines are reviewed at length with the patient today.  Concerns regarding medicines are outlined above.  Orders Placed This Encounter  Procedures  . Basic metabolic panel  . Brain natriuretic peptide   Meds ordered this encounter  Medications  . amLODipine (NORVASC) 5 MG tablet    Sig: Take 1 tablet (5 mg total) by mouth daily.    Dispense:  90 tablet    Refill:  1  . torsemide (DEMADEX) 20 MG tablet    Sig: Take 2 tab (40 mg) in the morning and 1 tab (20 mg) in the afternoon until weight is less than 290lb. Then take 1 tab (20 mg) twice daily.    Dispense:  180 tablet    Refill:  3    Patient Instructions  Medication Instructions:   DECREASE Amlodipine to 5 mg daily  CHANGE: Torsemide---take 40 mg (2 tab) in the morning and 20 mg (1 tab) in the afternoon until weight is less than 290lb. Then take 20 mg (1 tab) twice daily.  *If you need a refill on your cardiac medications before your next appointment, please call your pharmacy*   Lab Work: Your physician recommends that you return for lab work today: BMET, BNP  If you have labs (blood work) drawn today and your tests are completely normal, you will receive your results only by: Marland Kitchen MyChart Message (if you have MyChart) OR . A paper copy in the mail If you have any lab test that is abnormal or we need to change your treatment, we will call you to review the results.   Follow-Up: At [facility], you and your health needs are our priority.  As part of our continuing mission to provide you with exceptional heart care, we have created designated Provider Care Teams.  These Care Teams include  your primary Cardiologist (physician) and Advanced Practice Providers (APPs -  Physician Assistants and Nurse Practitioners) who all work together to provide you with the care you need, when you need it.  We recommend signing up for the patient portal called "MyChart".  Sign up information is provided on this After Visit Summary.  MyChart is used to connect with patients for Virtual Visits (Telemedicine).  Patients are able to view lab/test results, encounter notes, upcoming appointments, etc.  Non-urgent messages can be sent to your provider as well.   To learn more about what you can do with MyChart, go to [email or web address].    Your next appointment:   Tuesday, 02/19/20 at 3:20 PM  The format for your next appointment:   In Person  Provider:   Eleonore Chiquito, MD       Signed, Kerin Ransom, PA-C  01/25/2020 4:31 PM    [place]

## 2020-01-25 NOTE — Assessment & Plan Note (Signed)
Followed by psychiatry- on multiple medications  

## 2020-01-25 NOTE — Assessment & Plan Note (Signed)
Secondary to obesity, OSA- RVD on echo

## 2020-01-25 NOTE — Assessment & Plan Note (Signed)
She has seen pulmonary- never followed through with sleep study

## 2020-01-25 NOTE — Assessment & Plan Note (Signed)
EF 60-65% by echo Aug 2020, grade 1 DD

## 2020-01-25 NOTE — Assessment & Plan Note (Signed)
Controlled.  

## 2020-01-26 LAB — BASIC METABOLIC PANEL
BUN/Creatinine Ratio: 30 — ABNORMAL HIGH (ref 12–28)
BUN: 33 mg/dL — ABNORMAL HIGH (ref 8–27)
CO2: 26 mmol/L (ref 20–29)
Calcium: 10.7 mg/dL — ABNORMAL HIGH (ref 8.7–10.3)
Chloride: 100 mmol/L (ref 96–106)
Creatinine, Ser: 1.09 mg/dL — ABNORMAL HIGH (ref 0.57–1.00)
GFR calc Af Amer: 59 mL/min/{1.73_m2} — ABNORMAL LOW (ref >59)
GFR calc non Af Amer: 52 mL/min/{1.73_m2} — ABNORMAL LOW (ref >59)
Glucose: 112 mg/dL — ABNORMAL HIGH (ref 65–99)
Potassium: 4.8 mmol/L (ref 3.5–5.2)
Sodium: 138 mmol/L (ref 134–144)

## 2020-01-26 LAB — BRAIN NATRIURETIC PEPTIDE: BNP: 9 pg/mL (ref 0.0–100.0)

## 2020-01-28 ENCOUNTER — Telehealth: Payer: Self-pay | Admitting: Cardiovascular Disease

## 2020-01-28 NOTE — Telephone Encounter (Signed)
Returned call to patient- she states that she recently saw Lurena Joiner- and her medication list was not correct and she was not aware.  On her list it states she took 10 mg Amlodipine, but she was taking 5 mg Amlodipine, so when [place] suggested to decrease to the 5 mg to see if that helped, it is what she has been taking all along. She would like to know if it should be stopped or cut in half or what his suggestion is since she just realized this medication difference.  Advised I would route to [facility] to advise and give recommendations.

## 2020-01-28 NOTE — Telephone Encounter (Signed)
New message:     Patient would like for a nurse to call concering some medications. Please call patient.

## 2020-01-29 NOTE — Telephone Encounter (Signed)
Fu   Pt called back in to fu on the status of her phone call yesterday.     Best number [phone]

## 2020-01-29 NOTE — Telephone Encounter (Signed)
Patient aware and verbalized understanding.   Med list updated, OV 10/19 with Dr. Audie Box

## 2020-02-06 DIAGNOSIS — H11423 Conjunctival edema, bilateral: Secondary | ICD-10-CM | POA: Diagnosis not present

## 2020-02-06 DIAGNOSIS — H11823 Conjunctivochalasis, bilateral: Secondary | ICD-10-CM | POA: Diagnosis not present

## 2020-02-06 DIAGNOSIS — H16223 Keratoconjunctivitis sicca, not specified as Sjogren's, bilateral: Secondary | ICD-10-CM | POA: Diagnosis not present

## 2020-02-06 DIAGNOSIS — H04123 Dry eye syndrome of bilateral lacrimal glands: Secondary | ICD-10-CM | POA: Diagnosis not present

## 2020-02-12 ENCOUNTER — Other Ambulatory Visit: Payer: Self-pay | Admitting: Psychiatry

## 2020-02-12 DIAGNOSIS — F411 Generalized anxiety disorder: Secondary | ICD-10-CM

## 2020-02-12 NOTE — Telephone Encounter (Signed)
Review.

## 2020-02-18 NOTE — Progress Notes (Deleted)
Cardiology Office Note:   Date:  02/18/2020  NAME:  Jessica [name]    MRN: [id number] DOB:  [birth year]   PCP:  Jessica [name], L.Jessica Sa, MD  Cardiologist:  Jessica Field, MD  Electrophysiologist:  None   Referring MD: Jessica [name].Jessica Sa, MD   No chief complaint on file. ***  History of Present Illness:   Jessica [name] is a 69 y.o. female with a hx of HFpEF, morbid obesity, OSA/OHS who presents for follow-up. Recently seen for increased weight gain. Torsemide ordered for 40 mg in the AM and 20 mg in the evening. Follow-up today.   Problem List: 1. HFpEF -60-65%, G1DD 2. RV Failure 2/2 OSA/OHS 3. Morbid Obesity(BMI 53) 4. HTN  Past Medical History: Past Medical History:  Diagnosis Date  . Arthritis    lower back, hands, knees  . Bipolar disorder ([place])   . Cellulitis and abscess of left leg 11/29/2018  . CHF (congestive heart failure) ([place])   . Depression   . Dyspnea    with exertion  . GERD (gastroesophageal reflux disease)    patient unsure about this dx - no meds  . Headache   . History of IBS    watches diet  . Hyperlipidemia   . Hypertension   . Hypothyroidism   . Lung edema, acute, with congestive heart failure ([place])   . Obese   . Plantar fasciitis   . Seasonal allergies   . Sleep apnea 2017   uses CPAP   . Spinal stenosis of lumbar region   . SVD (spontaneous vaginal delivery)    x 2  . Systemic lupus ([place])   . Thyroid disease     Past Surgical History: Past Surgical History:  Procedure Laterality Date  . CARPAL TUNNEL RELEASE Left 09/16/2017   Procedure: LEFT CARPAL TUNNEL RELEASE;  Surgeon: Daryll Brod, MD;  Location: [facility];  Service: Orthopedics;  Laterality: Left;  . carpel tunnel surgery Right   . CATARACT EXTRACTION Bilateral 2007   w/ lens implants  . COLONOSCOPY    . DILATION AND CURETTAGE OF UTERUS    . EYE SURGERY    . FOOT SURGERY Right    hammer toe  . FRACTURE SURGERY     R tibia and fibula  . LEG SURGERY Right   .  TONSILLECTOMY    . WISDOM TOOTH EXTRACTION      Current Medications: No outpatient medications have been marked as taking for the 02/19/20 encounter (Appointment) with Geralynn Rile, MD.     Allergies:    Ibuprofen, Codeine, Nabumetone, Other, Promethazine, Amoxicillin-pot clavulanate, Erythromycin base, and Promethazine-codeine   Social History: Social History   Socioeconomic History  . Marital status: Married    Spouse name: Not on file  . Number of children: Not on file  . Years of education: Not on file  . Highest education level: Not on file  Occupational History  . Not on file  Tobacco Use  . Smoking status: Never Smoker  . Smokeless tobacco: Never Used  Vaping Use  . Vaping Use: Never used  Substance and Sexual Activity  . Alcohol use: Not Currently  . Drug use: Never  . Sexual activity: Not on file  Other Topics Concern  . Not on file  Social History Narrative   She lives in a single level home with her husband.  She has two grown children.   She taught college accounting courses, retired in 2005.    Highest level of education:  Doctorate in Press photographer   Social Determinants of Health   Financial Resource Strain:   . Difficulty of Paying Living Expenses: Not on file  Food Insecurity:   . Worried About Charity fundraiser in the Last Year: Not on file  . Ran Out of Food in the Last Year: Not on file  Transportation Needs:   . Lack of Transportation (Medical): Not on file  . Lack of Transportation (Non-Medical): Not on file  Physical Activity:   . Days of Exercise per Week: Not on file  . Minutes of Exercise per Session: Not on file  Stress:   . Feeling of Stress : Not on file  Social Connections:   . Frequency of Communication with Friends and Family: Not on file  . Frequency of Social Gatherings with Friends and Family: Not on file  . Attends Religious Services: Not on file  . Active Member of Clubs or Organizations: Not on file  . Attends English as a second language teacher Meetings: Not on file  . Marital Status: Not on file     Family History: The patient's ***family history includes Bipolar disorder in her father; Breast cancer in her mother; Dementia in her father; Diabetes in her father; Heart disease in her father and mother; Thyroid disease in her mother.  ROS:   All other ROS reviewed and negative. Pertinent positives noted in the HPI.     EKGs/Labs/Other Studies Reviewed:   The following studies were personally reviewed by me today:  EKG:  EKG is *** ordered today.  The ekg ordered today demonstrates ***, and was personally reviewed by me.   TTE 12/02/2018 1. The left ventricle has normal systolic function with an ejection  fraction of 60-65%. The cavity size was normal. There is mild concentric  left ventricular hypertrophy. Left ventricular diastolic Doppler  parameters are consistent with impaired  relaxation. Indeterminate filling pressures.  2. The right ventricle has mildly reduced systolic function. The cavity  was mildly enlarged. There is no increase in right ventricular wall  thickness.  3. The aortic valve is tricuspid. Moderate aortic annular calcification  noted.  4. The mitral valve is grossly normal. There is mild mitral annular  calcification present.  5. The tricuspid valve is grossly normal.  6. The aorta is normal in size and structure.   Recent Labs: 04/16/2019: ALT 17 01/25/2020: BNP 9.0; BUN 33; Creatinine, Ser 1.09; Potassium 4.8; Sodium 138   Recent Lipid Panel    Component Value Date/Time   CHOL 173 01/23/2013 1105   TRIG 271 (H) 01/23/2013 1105   HDL 51 01/23/2013 1105   CHOLHDL 3.4 01/23/2013 1105   VLDL 54 (H) 01/23/2013 1105   LDLCALC 68 01/23/2013 1105    Physical Exam:   VS:  There were no vitals taken for this visit.   Wt Readings from Last 3 Encounters:  01/25/20 (!) 307 lb 9.6 oz (139.5 kg)  01/08/20 (!) 310 lb 9.6 oz (140.9 kg)  12/25/19 (!) 308 lb (139.7 kg)    General:  Well nourished, well developed, in no acute distress Heart: Atraumatic, normal size  Eyes: PEERLA, EOMI  Neck: Supple, no JVD Endocrine: No thryomegaly Cardiac: Normal S1, S2; RRR; no murmurs, rubs, or gallops Lungs: Clear to auscultation bilaterally, no wheezing, rhonchi or rales  Abd: Soft, nontender, no hepatomegaly  Ext: No edema, pulses 2+ Musculoskeletal: No deformities, BUE and BLE strength normal and equal Skin: Warm and dry, no rashes   Neuro: Alert and oriented to person,  place, time, and situation, CNII-XII grossly intact, no focal deficits  Psych: Normal mood and affect   ASSESSMENT:   Jahyra Sukup is a 69 y.o. female who presents for the following: No diagnosis found.  PLAN:   There are no diagnoses linked to this encounter.  Disposition: No follow-ups on file.  Medication Adjustments/Labs and Tests Ordered: Current medicines are reviewed at length with the patient today.  Concerns regarding medicines are outlined above.  No orders of the defined types were placed in this encounter.  No orders of the defined types were placed in this encounter.   There are no Patient Instructions on file for this visit.   Time Spent with Patient: I have spent a total of *** minutes with patient reviewing hospital notes, telemetry, EKGs, labs and examining the patient as well as establishing an assessment and plan that was discussed with the patient.  > 50% of time was spent in direct patient care.  Signed, Addison Naegeli. Audie Box, [place]  [address], [place], [place] [address] [phone]  02/18/2020 7:10 AM

## 2020-02-19 ENCOUNTER — Telehealth: Payer: Self-pay | Admitting: Psychiatry

## 2020-02-19 ENCOUNTER — Ambulatory Visit: Payer: Medicare Other | Admitting: Cardiovascular Disease

## 2020-02-19 DIAGNOSIS — Z20822 Contact with and (suspected) exposure to covid-19: Secondary | ICD-10-CM | POA: Diagnosis not present

## 2020-02-19 DIAGNOSIS — R5383 Other fatigue: Secondary | ICD-10-CM | POA: Diagnosis not present

## 2020-02-19 NOTE — Telephone Encounter (Signed)
[place] called and she is ready to go down on the next mg of vyvanse. She has been on the 30 mg. She would like someone to call her with instructions on how to proceed and when the medicine has been sent to the pharmacy. Her number  Is [phone]

## 2020-02-20 ENCOUNTER — Other Ambulatory Visit: Payer: Self-pay | Admitting: Psychiatry

## 2020-02-20 DIAGNOSIS — F900 Attention-deficit hyperactivity disorder, predominantly inattentive type: Secondary | ICD-10-CM

## 2020-02-20 MED ORDER — LISDEXAMFETAMINE DIMESYLATE 20 MG PO CAPS: 20.0000 mg | ORAL_CAPSULE | ORAL | 0 refills | Status: DC

## 2020-02-20 NOTE — Telephone Encounter (Signed)
Please review

## 2020-02-20 NOTE — Telephone Encounter (Signed)
Let her know I sent in the prescription for the next lower dose of Vyvanse which is 20 mg 1 each morning as she requested

## 2020-02-20 NOTE — Telephone Encounter (Signed)
I called pt at 5pm to give info. Pt understands.

## 2020-02-20 NOTE — Telephone Encounter (Signed)
Perhaps the evening irritability is related to weaning Vyvanse if the problem is getting worse, but she talked to me about having irritability before weaning the Vyvanse..  Take the 20 mg Vyvanse for a month then stop it.

## 2020-02-20 NOTE — Telephone Encounter (Signed)
In talking to patient, she expresses that her irritability and outbursts are still bad, usually late in the evening. She wonders if the taper of Vyvanse might be causing these problems. She is also not sure how long this taper should last- she realizes you sent in the 20mg . Should she continue that for a month too?

## 2020-02-21 ENCOUNTER — Ambulatory Visit: Payer: Medicare Other | Admitting: Cardiovascular Disease

## 2020-02-21 NOTE — Telephone Encounter (Signed)
Left patient a detailed message and asked her to call back with any questions.
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
## 2020-02-25 ENCOUNTER — Ambulatory Visit (INDEPENDENT_AMBULATORY_CARE_PROVIDER_SITE_OTHER): Payer: Medicare Other | Admitting: Cardiovascular Disease

## 2020-02-25 ENCOUNTER — Other Ambulatory Visit: Payer: Self-pay

## 2020-02-25 ENCOUNTER — Encounter: Payer: Self-pay | Admitting: Cardiovascular Disease

## 2020-02-25 VITALS — BP 125/77 | HR 86 | Temp 98.2°F | Ht 62.0 in | Wt 306.0 lb

## 2020-02-25 DIAGNOSIS — I50810 Right heart failure, unspecified: Secondary | ICD-10-CM

## 2020-02-25 DIAGNOSIS — I5032 Chronic diastolic (congestive) heart failure: Secondary | ICD-10-CM | POA: Diagnosis not present

## 2020-02-25 DIAGNOSIS — R079 Chest pain, unspecified: Secondary | ICD-10-CM

## 2020-02-25 DIAGNOSIS — R0602 Shortness of breath: Secondary | ICD-10-CM

## 2020-02-25 DIAGNOSIS — I10 Essential (primary) hypertension: Secondary | ICD-10-CM

## 2020-02-25 MED ORDER — TORSEMIDE 20 MG PO TABS
ORAL_TABLET | ORAL | 3 refills | Status: DC
Start: 2020-02-25 — End: 2020-05-27

## 2020-02-25 NOTE — Patient Instructions (Signed)
Medication Instructions:     DECREASE DOSE  TORSEMIDE 20 MG DAILY    *If you need a refill on your cardiac medications before your next appointment, please call your pharmacy*   Lab Work: POSSIBLE BMP FOR UPCOMING CT  ANGIO FOR PULMONARY EMBOLUS   If you have labs (blood work) drawn today and your tests are completely normal, you will receive your results only by: Marland Kitchen MyChart Message (if you have MyChart) OR . A paper copy in the mail If you have any lab test that is abnormal or we need to change your treatment, we will call you to review the results.   Testing/Procedures:  WILL BE SCHEDULE AT [place] Non-Cardiac CT Angiography (CTA), is a special type of CT scan that uses a computer to produce multi-dimensional views of major blood vessels throughout the body. In CT angiography, a contrast material is injected through an IV to help visualize the blood vessels   Follow-Up: At [facility], you and your health needs are our priority.  As part of our continuing mission to provide you with exceptional heart care, we have created designated Provider Care Teams.  These Care Teams include your primary Cardiologist (physician) and Advanced Practice Providers (APPs -  Physician Assistants and Nurse Practitioners) who all work together to provide you with the care you need, when you need it.  We recommend signing up for the patient portal called "MyChart".  Sign up information is provided on this After Visit Summary.  MyChart is used to connect with patients for Virtual Visits (Telemedicine).  Patients are able to view lab/test results, encounter notes, upcoming appointments, etc.  Non-urgent messages can be sent to your provider as well.   To learn more about what you can do with MyChart, go to [email or web address].    Your next appointment:   3 month(s)  The format for your next appointment:   In Person  Provider:   Eleonore Chiquito, MD

## 2020-02-26 ENCOUNTER — Ambulatory Visit (INDEPENDENT_AMBULATORY_CARE_PROVIDER_SITE_OTHER): Payer: Medicare Other | Admitting: Psychiatry

## 2020-02-26 ENCOUNTER — Telehealth: Payer: Self-pay | Admitting: Pulmonary Disease

## 2020-02-26 ENCOUNTER — Encounter: Payer: Self-pay | Admitting: Psychiatry

## 2020-02-26 DIAGNOSIS — R7989 Other specified abnormal findings of blood chemistry: Secondary | ICD-10-CM

## 2020-02-26 DIAGNOSIS — F3162 Bipolar disorder, current episode mixed, moderate: Secondary | ICD-10-CM

## 2020-02-26 DIAGNOSIS — F411 Generalized anxiety disorder: Secondary | ICD-10-CM

## 2020-02-26 DIAGNOSIS — F900 Attention-deficit hyperactivity disorder, predominantly inattentive type: Secondary | ICD-10-CM

## 2020-02-26 DIAGNOSIS — G4733 Obstructive sleep apnea (adult) (pediatric): Secondary | ICD-10-CM

## 2020-02-26 LAB — BASIC METABOLIC PANEL
BUN/Creatinine Ratio: 25 (ref 12–28)
BUN: 21 mg/dL (ref 8–27)
CO2: 24 mmol/L (ref 20–29)
Calcium: 10.9 mg/dL — ABNORMAL HIGH (ref 8.7–10.3)
Chloride: 101 mmol/L (ref 96–106)
Creatinine, Ser: 0.83 mg/dL (ref 0.57–1.00)
GFR calc Af Amer: 83 mL/min/{1.73_m2} (ref >59)
GFR calc non Af Amer: 72 mL/min/{1.73_m2} (ref >59)
Glucose: 102 mg/dL — ABNORMAL HIGH (ref 65–99)
Potassium: 4.6 mmol/L (ref 3.5–5.2)
Sodium: 138 mmol/L (ref 134–144)
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
## 2020-02-26 NOTE — Telephone Encounter (Signed)
Called and spoke with Jessica [name] who states that over the last week or 2 she has not been feeling good and that her oxygen levels are dropping to 88% for a short period of time and going back up. She states she has not been using O2 during the day for months now, only using it at night. Typically wears 2 liters. She states she has a cough when she changes positions, is very sob with very little exertion. Pt tapering downVyvanse per psychiatrist's recommendation.Patient wanting to know if she needs to use oxygen all the time. Advised patient that per her last OV she is supposed to wear oxygen at night and as needed with activity. Patient states she does not remember being told that.  Advised patient that I would send message back to Dr. Cordelia Pen recommendations and we would call her back. Patient stated that she would like to talk to her about it and requested appointment. Patient has been scheduled for tomorrow with Dr. Loanne Drilling at 10:30. Will route to Dr. Loanne Drilling as Juluis Rainier.

## 2020-02-27 ENCOUNTER — Telehealth: Payer: Self-pay | Admitting: Psychiatry

## 2020-02-27 ENCOUNTER — Other Ambulatory Visit: Payer: Self-pay

## 2020-02-27 ENCOUNTER — Other Ambulatory Visit: Payer: Self-pay | Admitting: Psychiatry

## 2020-02-27 ENCOUNTER — Encounter: Payer: Self-pay | Admitting: Pulmonary Disease

## 2020-02-27 ENCOUNTER — Ambulatory Visit (INDEPENDENT_AMBULATORY_CARE_PROVIDER_SITE_OTHER): Payer: Medicare Other | Admitting: Pulmonary Disease

## 2020-02-27 VITALS — HR 100 | Temp 98.1°F | Ht 62.0 in | Wt 308.0 lb

## 2020-02-27 DIAGNOSIS — I5032 Chronic diastolic (congestive) heart failure: Secondary | ICD-10-CM | POA: Diagnosis not present

## 2020-02-27 DIAGNOSIS — G4733 Obstructive sleep apnea (adult) (pediatric): Secondary | ICD-10-CM | POA: Diagnosis not present

## 2020-02-27 DIAGNOSIS — J9611 Chronic respiratory failure with hypoxia: Secondary | ICD-10-CM

## 2020-02-27 MED ORDER — ALBUTEROL SULFATE HFA 108 (90 BASE) MCG/ACT IN AERS: 2.0000 | INHALATION_SPRAY | Freq: Four times a day (QID) | RESPIRATORY_TRACT | 6 refills | Status: DC | PRN

## 2020-02-27 NOTE — Telephone Encounter (Signed)
Had office visit yesterday

## 2020-02-27 NOTE — Patient Instructions (Addendum)
Chronic hypoxemic respiratory failure secondary chronic diastolic heart failure  CONTINUE supplemental oxygen with exertion for >88% START albuterol as needed or prior to activity Follow-up CTA when available  Chronic diastolic heart failure CONTINUE diuretics per Cardiology  Obstructive sleep apnea Continue CPAP per Sleep  Chronic pain Followed by Rheumatology Unable to refer to Pain Clinic due to prior interaction with the referral practice. I updated patient on this.  Morbid Obesity Discussed portion control and regular activity  Follow-up in 3 months

## 2020-02-27 NOTE — Telephone Encounter (Signed)
Unfortunately what she is reading is a class warning.  It lists every medicine in the family with the same warning.  Vraylar specifically has no significant risk of weight gain and no significant risk of causing diabetes.  And more specifically there is no other medicine I could possibly prescribe to her that would have any less risk of weight gain or diabetes than Vraylar.  She has taken all the other alternatives. I have no other options to offer her.

## 2020-02-27 NOTE — Progress Notes (Signed)
Subjective:   PATIENT ID: Jessica [name] GENDER: female DOB: 1950/09/28, MRN: [id number]   HPI  Chief Complaint  Patient presents with  . Follow-up   Reason for Visit: Follow-up   [name] is a 69 year old female never smoker with chronic hypoxemic respiratory failure, OSA on CPAP, spinal stenosis and lupus who presents for follow-up for shortness of breath.  She was last seen in Pulmonary clinic with NP Parrett on 01/08/20 for follow-up. She had previously been seen for increased LE swelling and had her torsemide increased with improved swelling. She has also recently seen her Sleep physician Dr. Dwyane Dee on 01/18/20 who reviewed her CPAP compliance (100%). She was titrated to BiPAP however insurance did not cover as report showed control on CPAP 15cm H20. She is currently on CPAP for her OSA. On 02/25/20 she was seen by her Cardiologist Dr. Audie Box. She was euvolemic on exam but due to her dyspnea and hypoxemia, CTA was ordered to rule out embolism.  She is compliant with her home oxygen, CPAP and diuretics. In the last few months, she reports worsening shortness of breath and stamina that worsens with exertion. Climbing into the [place] will wind her as well. She has been checking her pulse oximeter during exertion and will note saturations 86-88% but will recover in less than five minutes. Rest will improve her hypoxemia. Denies wheezing or cough.  Social History: Never smoker  I have personally reviewed patient's past medical/family/social history/allergies/current medications.  Past Medical History:  Diagnosis Date  . Arthritis    lower back, hands, knees  . Bipolar disorder ([place])   . Cellulitis and abscess of left leg 11/29/2018  . CHF (congestive heart failure) ([place])   . Depression   . Dyspnea    with exertion  . GERD (gastroesophageal reflux disease)    patient unsure about this dx - no meds  . Headache   . History of IBS    watches diet  . Hyperlipidemia    . Hypertension   . Hypothyroidism   . Lung edema, acute, with congestive heart failure ([place])   . Obese   . Plantar fasciitis   . Seasonal allergies   . Sleep apnea 2017   uses CPAP   . Spinal stenosis of lumbar region   . SVD (spontaneous vaginal delivery)    x 2  . Systemic lupus ([place])   . Thyroid disease      Family History  Problem Relation Age of Onset  . Thyroid disease Mother   . Breast cancer Mother   . Heart disease Mother   . Bipolar disorder Father   . Diabetes Father   . Heart disease Father   . Dementia Father      Social History   Occupational History  . Not on file  Tobacco Use  . Smoking status: Never Smoker  . Smokeless tobacco: Never Used  Vaping Use  . Vaping Use: Never used  Substance and Sexual Activity  . Alcohol use: Not Currently  . Drug use: Never  . Sexual activity: Not on file    Allergies  Allergen Reactions  . Ibuprofen Diarrhea, Nausea Only and Other (See Comments)    Extreme stomach pain  Makes her feel bad  . Codeine     UNSPECIFIED REACTION   . Nabumetone     UNSPECIFIED REACTION   . Other     Seasonal allergies  . Promethazine     UNSPECIFIED REACTION   .  Amoxicillin-Pot Clavulanate Nausea And Vomiting  . Erythromycin Base Diarrhea  . Promethazine-Codeine Other (See Comments)    "Makes her feel bad"     Outpatient Medications Prior to Visit  Medication Sig Dispense Refill  . acetaminophen (TYLENOL) 650 MG CR tablet Take 650 mg by mouth daily.     . Acetylcysteine 600 MG CAPS Take 600 mg by mouth 2 (two) times daily. NAC    . Carboxymeth-Glycerin-Polysorb (REFRESH OPTIVE ADVANCED OP) Apply to eye.    . cycloSPORINE (RESTASIS) 0.05 % ophthalmic emulsion Place 1 drop into both eyes 2 (two) times daily.    . DULoxetine (CYMBALTA) 60 MG capsule TAKE 1 CAPSULE BY MOUTH EVERY DAY 90 capsule 0  . econazole nitrate 1 % cream Apply topically daily.    Marland Kitchen gabapentin (NEURONTIN) 300 MG capsule 1 in AM and 3 at evening 360  capsule 1  . levothyroxine (SYNTHROID) 25 MCG tablet Take 25 mcg by mouth daily before breakfast.     . lithium carbonate 150 MG capsule TAKE 5 CAPSULES (750 MG TOTAL) BY MOUTH AT BEDTIME. 450 capsule 0  . metoprolol tartrate (LOPRESSOR) 25 MG tablet Take 0.5 tablets (12.5 mg total) by mouth 2 (two) times daily. 60 tablet 0  . Multiple Vitamins-Minerals (MULTIVITAMIN WITH MINERALS) tablet Take 1 tablet by mouth daily.    . Multiple Vitamins-Minerals (PRESERVISION AREDS 2) CAPS Take 1 capsule 2 (two) times daily by mouth.    . potassium chloride SA (KLOR-CON) 20 MEQ tablet Take 1 tablet by mouth daily with Torsemide 90 tablet 1  . pravastatin (PRAVACHOL) 20 MG tablet Take 20 mg at bedtime by mouth.     . SYNTHROID 200 MCG tablet TAKE 1 TABLET BY MOUTH EVERY MORNING ON AN EMPTY STOMACH    . Thiamine HCl (VITAMIN B1) 100 MG TABS Take 1 tablet by mouth daily.    Marland Kitchen torsemide (DEMADEX) 20 MG tablet Take 1 tab (20 mg) in the morning and may take an extra  1 tab (20 mg) in the afternoon if needed. 180 tablet 3  . vitamin B-12 (CYANOCOBALAMIN) 100 MCG tablet Take 100 mcg by mouth 2 (two) times daily.    . Vitamin D, Ergocalciferol, (DRISDOL) 1.25 MG (50000 UNIT) CAPS capsule TAKE 1 CAPSULE (50,000 UNITS TOTAL) BY MOUTH EVERY 7 (SEVEN) DAYS. 12 capsule 2   No facility-administered medications prior to visit.    Review of Systems  Constitutional: Negative for chills, diaphoresis, fever, malaise/fatigue and weight loss.  HENT: Negative for congestion.   Respiratory: Positive for shortness of breath. Negative for cough, hemoptysis, sputum production and wheezing.   Cardiovascular: Negative for chest pain, palpitations and leg swelling.    Objective:   Vitals:   02/27/20 1043  Pulse: 100  Temp: 98.1 F (36.7 C)  SpO2: 90%  Weight: (!) 308 lb (139.7 kg)  Height: 5\' 2"  (1.575 m)      Physical Exam: General: Morbidly obese (BMI 56), no acute distress HENT: [place], AT Eyes: EOMI, no scleral  icterus Respiratory: Clear to auscultation bilaterally.  No crackles, wheezing or rales Cardiovascular: RRR, -M/R/G, no JVD Extremities: Non-pitting edema in lower extremities,-tenderness Neuro: AAO x4, CNII-XII grossly intact Skin: Intact, no rashes or bruising Psych: Normal mood, normal affect  Data Reviewed:  Imaging: CXR 11/16/18 - No infiltrate, edema or effusion. Enlarged cardiac silhouette CXR 12/09/18 - Cardiomegaly, bibasilar atelectasis  PFT: 01/29/19 FVC 0.61 (21%) FEV1 0.61 (28%) Ratio 100 Interpretation: No obstructive defect. Reduced FVC and FEV1 suggestive of restrictive defect  Labs: CBC    Component Value Date/Time   WBC 16.7 (H) 12/11/2018 0753   RBC 4.62 12/11/2018 0753   HGB 14.4 12/11/2018 0753   HCT 47.6 (H) 12/11/2018 0753   PLT 337 12/11/2018 0753   MCV 103.0 (H) 12/11/2018 0753   MCH 31.2 12/11/2018 0753   MCHC 30.3 12/11/2018 0753   RDW 13.3 12/11/2018 0753   LYMPHSABS 2.0 12/11/2018 0753   MONOABS 1.7 (H) 12/11/2018 0753   EOSABS 0.5 12/11/2018 0753   BASOSABS 0.1 12/11/2018 0753   CMP     Component Value Date/Time   NA 138 02/25/2020 1549   K 4.6 02/25/2020 1549   CL 101 02/25/2020 1549   CO2 24 02/25/2020 1549   GLUCOSE 102 (H) 02/25/2020 1549   GLUCOSE 106 (H) 11/23/2019 1629   BUN 21 02/25/2020 1549   CREATININE 0.83 02/25/2020 1549   CREATININE 0.93 11/23/2019 1629   CALCIUM 10.9 (H) 02/25/2020 1549   PROT 6.6 04/16/2019 1613   ALBUMIN 4.0 04/16/2019 1613   AST 22 04/16/2019 1613   ALT 17 04/16/2019 1613   ALKPHOS 146 (H) 04/16/2019 1613   BILITOT 0.2 04/16/2019 1613   GFRNONAA 72 02/25/2020 1549   GFRNONAA 66 05/15/2018 1118   GFRAA 83 02/25/2020 1549   GFRAA 76 05/15/2018 1118   Sleep Study: 02/2016 - AHI 133  Ambulatory O2 11/22/18 Patient Saturations on Room Air at Rest = 88% Patient Saturations on Room Air while Ambulating = 88% Patient Saturations on 4 Liters of oxygen while Ambulating = 91%  Recommend 2L of O2 at  rest, 4L of O2 with exertion  Ambulatory O2 04/09/19  Patient Saturations on Room Air at Rest = 92% Patient Saturations on Room Air while Ambulating = 87% Patient Saturations on 2Liters of oxygen while Ambulating = 95%  Recommend 0L of O2 via nasal cannula at rest  Recommend 2L of O2 via nasal cannula with exertion and sleep  TTE  12/02/18 - EF 60-65%. Diastolic heart failure, mildly decreased RV systolic function. Aortic and mitral annular calcification.  Imaging, labs and test noted above have been reviewed independently by me.    Assessment & Plan:   Discussion: 69 year old female with morbid obesity, OSA, chronic diastolic heart failure, DDD and osteoarthritis and lupus who presents for chronic hypoxemic respiratory failure and OSA. CTA ordered by her Cardiologist to rule out embolism. Discussed compliance with oxygen therapy. Her heart failure seems under control as she is euvolemic on exam. I suspect she may have undiagnosed pulmonary hypertension contributing to her hypoxemia. Deconditioning also remains a major factor in her dyspnea and we discussed continuing to be active however she feels this may be difficult to achieve. We discussed barriers and potential ways to overcome them.  Chronic hypoxemic respiratory failure secondary chronic diastolic heart failure  CONTINUE supplemental oxygen with exertion for >88% START albuterol as needed or prior to activity Follow-up CTA when available  Chronic diastolic heart failure CONTINUE diuretics per Cardiology  Obstructive sleep apnea Continue CPAP per Sleep  Chronic pain Followed by Rheumatology Unable to refer to Pain Clinic due to prior interaction with the referral practice. I updated patient on this.  Morbid Obesity Discussed portion control and regular activity  No orders of the defined types were placed in this encounter.  Meds ordered this encounter  Medications  . albuterol (VENTOLIN HFA) 108 (90 Base) MCG/ACT  inhaler    Sig: Inhale 2 puffs into the lungs every 6 (six) hours as needed  for wheezing or shortness of breath.    Dispense:  8 g    Refill:  6   Return in about 3 months (around 05/29/2020).   Melissa, MD [facility] 02/27/2020 10:44 AM  Office Number [phone]

## 2020-02-27 NOTE — Telephone Encounter (Signed)
Cottle Rxed Vraylar yesterday to her. She does not want to take it due to side effects of possibility of weight gain and diabetes. Does not need to gain weight and has a family history of diabetes. She also saw the warning about overheating and does not think that is good.

## 2020-02-28 ENCOUNTER — Encounter: Payer: Self-pay | Admitting: Pulmonary Disease

## 2020-02-29 ENCOUNTER — Ambulatory Visit
Admission: RE | Admit: 2020-02-29 | Discharge: 2020-02-29 | Disposition: A | Discharge status: Home / Self Care | Payer: Medicare Other | Source: Ambulatory Visit | Attending: Cardiovascular Disease | Admitting: Cardiovascular Disease

## 2020-02-29 DIAGNOSIS — I50810 Right heart failure, unspecified: Secondary | ICD-10-CM

## 2020-02-29 DIAGNOSIS — R079 Chest pain, unspecified: Secondary | ICD-10-CM

## 2020-02-29 DIAGNOSIS — R0602 Shortness of breath: Secondary | ICD-10-CM | POA: Diagnosis not present

## 2020-02-29 DIAGNOSIS — I7789 Other specified disorders of arteries and arterioles: Secondary | ICD-10-CM | POA: Diagnosis not present

## 2020-02-29 DIAGNOSIS — R911 Solitary pulmonary nodule: Secondary | ICD-10-CM | POA: Diagnosis not present

## 2020-02-29 DIAGNOSIS — I5032 Chronic diastolic (congestive) heart failure: Secondary | ICD-10-CM

## 2020-02-29 MED ORDER — IOPAMIDOL (ISOVUE-370) INJECTION 76%
75.0000 mL | Freq: Once | INTRAVENOUS | Status: AC | PRN
  Administered 2020-02-29: 75 mL via INTRAVENOUS

## 2020-02-29 NOTE — Telephone Encounter (Signed)
Rtc to patient and discussed her concerns. She does want to let Dr. Clovis Pu know that she is going to hold off right now before starting Vraylar. She wants to see how she is feeling off the Vyvanse before starting something else. Advised her if her depressive symptoms worsen to call back. She agreed.

## 2020-03-05 ENCOUNTER — Ambulatory Visit: Payer: Medicare Other | Admitting: Psychiatry

## 2020-03-20 ENCOUNTER — Ambulatory Visit (INDEPENDENT_AMBULATORY_CARE_PROVIDER_SITE_OTHER): Payer: Medicare Other | Admitting: Psychiatry

## 2020-03-20 ENCOUNTER — Ambulatory Visit: Payer: Medicare Other | Admitting: Cardiovascular Disease

## 2020-03-20 ENCOUNTER — Other Ambulatory Visit: Payer: Self-pay

## 2020-03-20 DIAGNOSIS — G4733 Obstructive sleep apnea (adult) (pediatric): Secondary | ICD-10-CM

## 2020-03-20 DIAGNOSIS — F411 Generalized anxiety disorder: Secondary | ICD-10-CM

## 2020-03-20 DIAGNOSIS — R7989 Other specified abnormal findings of blood chemistry: Secondary | ICD-10-CM | POA: Diagnosis not present

## 2020-03-20 DIAGNOSIS — F900 Attention-deficit hyperactivity disorder, predominantly inattentive type: Secondary | ICD-10-CM

## 2020-03-20 DIAGNOSIS — F3162 Bipolar disorder, current episode mixed, moderate: Secondary | ICD-10-CM | POA: Diagnosis not present
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
## 2020-03-21 ENCOUNTER — Encounter: Payer: Self-pay | Admitting: Psychiatry

## 2020-03-21 ENCOUNTER — Telehealth: Payer: Self-pay | Admitting: Cardiovascular Disease

## 2020-03-21 NOTE — Telephone Encounter (Signed)
Spoke with patient who states that her psychiatrist wants her to start taking Vraylar 1.5mg .  Patient states that she has a family history of diabetes and is concerned that the drug could possibly cause diabetes. Patient wants to know if Dr. Audie Box will review her past glucose levels and wants to know if Dr. Audie Box thinks that its safe for her to take this drug from a cardiac stand point. Advised patient I would forward message to Dr. Audie Box for review and advice. Patient verbalized understanding.

## 2020-03-21 NOTE — Telephone Encounter (Addendum)
Advised patient of Dr. Kathalene Frames advice.  Jessica Rile, MD      No issues from my point of view.        Patient verbalized understanding.   Also spoke with pharmacy to check any interactions between medications   Per PharmD (Raquel) "Okay to take but combination with metoprolol and torsemide may enhance BP lowering effect of other medication patient should check BP 2-3 times per week and call clinic if BP <90/60 noted".     Patient Notified and Verbalized understanding.

## 2020-03-21 NOTE — Telephone Encounter (Signed)
No issues from my point of view.   [place] T. Audie Box, [place]  [address] [place], [place] [address] [phone]  3:30 PM

## 2020-03-21 NOTE — Telephone Encounter (Signed)
Pt c/o medication issue:  1. Name of Medication: Vraylar 1.5 MG's   2. How are you currently taking this medication (dosage and times per day)? Not currently taking   3. Are you having a reaction (difficulty breathing--STAT)? No   4. What is your medication issue? Iara is calling stating her psychiatrist is wanting her to start this medication. She is wanting to know if Dr. Audie Box feels it's okay for her to take this as well as if her Blood Sugar has been recently checked. She states it advises that be done prior to starting. Please advise.

## 2020-04-02 DIAGNOSIS — L570 Actinic keratosis: Secondary | ICD-10-CM | POA: Diagnosis not present

## 2020-04-02 DIAGNOSIS — J45909 Unspecified asthma, uncomplicated: Secondary | ICD-10-CM | POA: Diagnosis not present

## 2020-04-02 DIAGNOSIS — R200 Anesthesia of skin: Secondary | ICD-10-CM | POA: Diagnosis not present

## 2020-04-02 DIAGNOSIS — M47812 Spondylosis without myelopathy or radiculopathy, cervical region: Secondary | ICD-10-CM | POA: Diagnosis not present

## 2020-04-02 DIAGNOSIS — M79606 Pain in leg, unspecified: Secondary | ICD-10-CM | POA: Diagnosis not present

## 2020-04-04 ENCOUNTER — Telehealth: Payer: Self-pay | Admitting: Psychiatry

## 2020-04-04 NOTE — Telephone Encounter (Signed)
Patient called and has some concerns about the Vraylar side effects. She got samples of this on last visit. # is [phone].

## 2020-04-07 NOTE — Telephone Encounter (Signed)
Left message to call back  

## 2020-04-08 ENCOUNTER — Other Ambulatory Visit: Payer: Self-pay | Admitting: Psychiatry

## 2020-04-08 DIAGNOSIS — F319 Bipolar disorder, unspecified: Secondary | ICD-10-CM

## 2020-04-08 NOTE — Telephone Encounter (Signed)
She can take it every other day and that might be a therapeutic dose for her.  I would expect the wooziness to resolve within a week or so.  It does not significantly affect blood pressure or pulse so that is reassuring.

## 2020-04-08 NOTE — Telephone Encounter (Signed)
Patient called back and reports she tried the Vraylar 1.5 mg day before Thanksgiving and it made her feel "woozy" all day on Thanksgiving. She tried it again last night and took it at midnight hoping it wouldn't effect her while sleeping. She reports still feeling "woozy" today. Asking if side effects will get better? She read it has a long half-life and wondered if she could take every other day or something. She wants to know how long she should give it?   Informed her I would discuss with Dr. Clovis Pu and follow up

## 2020-04-09 ENCOUNTER — Telehealth: Payer: Self-pay | Admitting: Psychiatry

## 2020-04-09 DIAGNOSIS — M180 Bilateral primary osteoarthritis of first carpometacarpal joints: Secondary | ICD-10-CM | POA: Diagnosis not present

## 2020-04-09 NOTE — Telephone Encounter (Signed)
Theophilus Kinds would like for you to call her tomorrow please. [phone].

## 2020-04-09 NOTE — Telephone Encounter (Signed)
Called patient and tried to discuss with her the recommendation but as we were talking she said she fell off the couch and was on 1 knee and would have to talk to me later. Will check on patient this afternoon.

## 2020-04-09 NOTE — Telephone Encounter (Signed)
Noted thanks °

## 2020-04-10 NOTE — Telephone Encounter (Signed)
Patient called back and reports she did not get hurt from fall off couch yesterday, she slid between the couch and the coffee table and couldn't get up. Her husband and a friend were able to help her get up with no injury. She was just upset and shaken from the incident.   She reports she waited 2 days in between in last Vraylar 1.5 mg, advised her it's okay to try that for a few days then go to every other day. Wooziness should improve. Call if symptoms worsen.

## 2020-04-11 DIAGNOSIS — G4733 Obstructive sleep apnea (adult) (pediatric): Secondary | ICD-10-CM | POA: Diagnosis not present

## 2020-04-11 DIAGNOSIS — Z7189 Other specified counseling: Secondary | ICD-10-CM | POA: Diagnosis not present

## 2020-04-11 DIAGNOSIS — Z9989 Dependence on other enabling machines and devices: Secondary | ICD-10-CM | POA: Diagnosis not present

## 2020-04-11 DIAGNOSIS — Z79899 Other long term (current) drug therapy: Secondary | ICD-10-CM | POA: Diagnosis not present

## 2020-04-11 DIAGNOSIS — Z6841 Body Mass Index (BMI) 40.0 and over, adult: Secondary | ICD-10-CM | POA: Diagnosis not present

## 2020-04-14 ENCOUNTER — Telehealth: Payer: Self-pay | Admitting: Psychiatry

## 2020-04-14 NOTE — Telephone Encounter (Signed)
Pt called had 3 doses of there vraylar. It is making her dizzy. And also interacts with her dieractic. So please  Call her and give her direction on what to do. She can't take the medicine

## 2020-04-14 NOTE — Telephone Encounter (Signed)
RTC  LM to stop the Vraylar even every other day.  So stop.  No easy alternative.  Will discuss at next appt.

## 2020-04-15 DIAGNOSIS — S0502XA Injury of conjunctiva and corneal abrasion without foreign body, left eye, initial encounter: Secondary | ICD-10-CM | POA: Diagnosis not present

## 2020-04-15 DIAGNOSIS — Z23 Encounter for immunization: Secondary | ICD-10-CM | POA: Diagnosis not present

## 2020-04-16 DIAGNOSIS — H04123 Dry eye syndrome of bilateral lacrimal glands: Secondary | ICD-10-CM | POA: Diagnosis not present

## 2020-04-16 DIAGNOSIS — H16223 Keratoconjunctivitis sicca, not specified as Sjogren's, bilateral: Secondary | ICD-10-CM | POA: Diagnosis not present

## 2020-04-16 DIAGNOSIS — H11423 Conjunctival edema, bilateral: Secondary | ICD-10-CM | POA: Diagnosis not present

## 2020-04-16 DIAGNOSIS — H11823 Conjunctivochalasis, bilateral: Secondary | ICD-10-CM | POA: Diagnosis not present

## 2020-04-17 ENCOUNTER — Other Ambulatory Visit: Payer: Self-pay | Admitting: Psychiatry

## 2020-04-17 DIAGNOSIS — F411 Generalized anxiety disorder: Secondary | ICD-10-CM

## 2020-04-18 ENCOUNTER — Emergency Department (HOSPITAL_COMMUNITY)
Admission: EM | Admit: 2020-04-18 | Discharge: 2020-04-18 | Disposition: A | Discharge status: Home / Self Care | Payer: Medicare Other | Attending: Emergency Medicine | Admitting: Emergency Medicine

## 2020-04-18 ENCOUNTER — Encounter (HOSPITAL_COMMUNITY): Payer: Self-pay

## 2020-04-18 ENCOUNTER — Emergency Department (HOSPITAL_COMMUNITY): Payer: Medicare Other

## 2020-04-18 ENCOUNTER — Other Ambulatory Visit: Payer: Self-pay

## 2020-04-18 DIAGNOSIS — Z79899 Other long term (current) drug therapy: Secondary | ICD-10-CM | POA: Insufficient documentation

## 2020-04-18 DIAGNOSIS — I110 Hypertensive heart disease with heart failure: Secondary | ICD-10-CM | POA: Insufficient documentation

## 2020-04-18 DIAGNOSIS — S0502XD Injury of conjunctiva and corneal abrasion without foreign body, left eye, subsequent encounter: Secondary | ICD-10-CM | POA: Diagnosis not present

## 2020-04-18 DIAGNOSIS — R609 Edema, unspecified: Secondary | ICD-10-CM | POA: Diagnosis not present

## 2020-04-18 DIAGNOSIS — R110 Nausea: Secondary | ICD-10-CM | POA: Insufficient documentation

## 2020-04-18 DIAGNOSIS — E039 Hypothyroidism, unspecified: Secondary | ICD-10-CM | POA: Diagnosis not present

## 2020-04-18 DIAGNOSIS — R079 Chest pain, unspecified: Secondary | ICD-10-CM | POA: Diagnosis not present

## 2020-04-18 DIAGNOSIS — R0789 Other chest pain: Secondary | ICD-10-CM | POA: Diagnosis not present

## 2020-04-18 DIAGNOSIS — R072 Precordial pain: Secondary | ICD-10-CM | POA: Diagnosis not present

## 2020-04-18 DIAGNOSIS — I5032 Chronic diastolic (congestive) heart failure: Secondary | ICD-10-CM | POA: Diagnosis not present

## 2020-04-18 DIAGNOSIS — I491 Atrial premature depolarization: Secondary | ICD-10-CM | POA: Diagnosis not present

## 2020-04-18 DIAGNOSIS — I440 Atrioventricular block, first degree: Secondary | ICD-10-CM | POA: Diagnosis not present

## 2020-04-18 DIAGNOSIS — R0602 Shortness of breath: Secondary | ICD-10-CM | POA: Diagnosis not present

## 2020-04-18 LAB — TROPONIN I (HIGH SENSITIVITY)
Troponin I (High Sensitivity): 11 ng/L (ref <18)
Troponin I (High Sensitivity): 12 ng/L (ref <18)

## 2020-04-18 LAB — CBC
HCT: 39.1 % (ref 36.0–46.0)
Hemoglobin: 12 g/dL (ref 12.0–15.0)
MCH: 30.4 pg (ref 26.0–34.0)
MCHC: 30.7 g/dL (ref 30.0–36.0)
MCV: 99 fL (ref 80.0–100.0)
Platelets: 352 10*3/uL (ref 150–400)
RBC: 3.95 MIL/uL (ref 3.87–5.11)
RDW: 14 % (ref 11.5–15.5)
WBC: 12.7 10*3/uL — ABNORMAL HIGH (ref 4.0–10.5)
nRBC: 0 % (ref 0.0–0.2)

## 2020-04-18 LAB — COMPREHENSIVE METABOLIC PANEL
ALT: 16 U/L (ref 0–44)
AST: 19 U/L (ref 15–41)
Albumin: 3.3 g/dL — ABNORMAL LOW (ref 3.5–5.0)
Alkaline Phosphatase: 119 U/L (ref 38–126)
Anion gap: 9 (ref 5–15)
BUN: 32 mg/dL — ABNORMAL HIGH (ref 8–23)
CO2: 24 mmol/L (ref 22–32)
Calcium: 10.8 mg/dL — ABNORMAL HIGH (ref 8.9–10.3)
Chloride: 106 mmol/L (ref 98–111)
Creatinine, Ser: 1.03 mg/dL — ABNORMAL HIGH (ref 0.44–1.00)
GFR, Estimated: 58 mL/min — ABNORMAL LOW (ref >60)
Glucose, Bld: 118 mg/dL — ABNORMAL HIGH (ref 70–99)
Potassium: 4.7 mmol/L (ref 3.5–5.1)
Sodium: 139 mmol/L (ref 135–145)
Total Bilirubin: 0.2 mg/dL — ABNORMAL LOW (ref 0.3–1.2)
Total Protein: 6.1 g/dL — ABNORMAL LOW (ref 6.5–8.1)

## 2020-04-18 LAB — BRAIN NATRIURETIC PEPTIDE: B Natriuretic Peptide: 37.2 pg/mL (ref 0.0–100.0)

## 2020-04-18 LAB — LIPASE, BLOOD: Lipase: 32 U/L (ref 11–51)

## 2020-04-18 MED ORDER — CYCLOSPORINE 0.05 % OP EMUL: 1.0000 [drp] | Freq: Two times a day (BID) | OPHTHALMIC | Status: DC

## 2020-04-18 MED ORDER — FAMOTIDINE 20 MG PO TABS: 20.0000 mg | ORAL_TABLET | Freq: Every day | ORAL | 0 refills | Status: DC

## 2020-04-18 MED ORDER — ONDANSETRON HCL 4 MG/2ML IJ SOLN
4.0000 mg | Freq: Once | INTRAMUSCULAR | Status: AC
  Administered 2020-04-18: 4 mg via INTRAVENOUS
  Filled 2020-04-18: qty 2

## 2020-04-18 MED ORDER — ALUM & MAG HYDROXIDE-SIMETH 200-200-20 MG/5ML PO SUSP
30.0000 mL | Freq: Once | ORAL | Status: AC
  Administered 2020-04-18: 30 mL via ORAL
  Filled 2020-04-18: qty 30

## 2020-04-18 MED ORDER — CYCLOSPORINE 0.05 % OP EMUL
1.0000 [drp] | Freq: Two times a day (BID) | OPHTHALMIC | Status: DC
  Filled 2020-04-18: qty 1

## 2020-04-18 MED ORDER — ACETAMINOPHEN 325 MG PO TABS
650.0000 mg | ORAL_TABLET | Freq: Once | ORAL | Status: AC
  Administered 2020-04-18: 650 mg via ORAL
  Filled 2020-04-18: qty 2

## 2020-04-18 MED ORDER — LIDOCAINE VISCOUS HCL 2 % MT SOLN
15.0000 mL | Freq: Once | OROMUCOSAL | Status: AC
  Administered 2020-04-18: 15 mL via ORAL
  Filled 2020-04-18: qty 15

## 2020-04-18 NOTE — ED Triage Notes (Signed)
Pt brought to ED via EMS from with c/o substernal CP radiating across chest that began around 3AM. Accompanying dizziness, nausea, headache, abd pain, intermittent shortness of breath. Pt given 324mg  aspirin by EMS, refused nitro. Pt states she took 2 fluid pills today per MD for CHF exacerbation. CP currently rated 2/10. Alert and oriented x4, VSS.  EMS v/s: 132/64 BP 85 HR 91% on room air 129 CBG

## 2020-04-18 NOTE — ED Provider Notes (Signed)
[facility] EMERGENCY DEPARTMENT Provider Note  CSN: [id number] Arrival date & time: 04/18/20 0532  Chief Complaint(s) Chest Pain  HPI Jessica [name] is a 69 y.o. female   The history is provided by the patient.  Chest Pain Pain location:  Substernal area Pain quality: burning   Pain radiates to:  Does not radiate Pain severity:  Moderate Onset quality:  Gradual Timing:  Constant Progression since onset: significantly improved. Chronicity:  New Context: eating (yogurt and raisin bran.)   Relieved by:  Nothing Worsened by:  Nothing Associated symptoms: nausea   Associated symptoms: no abdominal pain, no anxiety, no back pain, no cough, no fatigue, no fever, no palpitations, no shortness of breath and no vomiting   Risk factors: high cholesterol, hypertension and obesity   Risk factors: no diabetes mellitus, no prior DVT/PE and no smoking     Past Medical History Past Medical History:  Diagnosis Date  . Arthritis    lower back, hands, knees  . Bipolar disorder ([place])   . Cellulitis and abscess of left leg 11/29/2018  . CHF (congestive heart failure) ([place])   . Depression   . Dyspnea    with exertion  . GERD (gastroesophageal reflux disease)    patient unsure about this dx - no meds  . Headache   . History of IBS    watches diet  . Hyperlipidemia   . Hypertension   . Hypothyroidism   . Lung edema, acute, with congestive heart failure ([place])   . Obese   . Plantar fasciitis   . Seasonal allergies   . Sleep apnea 2017   uses CPAP   . Spinal stenosis of lumbar region   . SVD (spontaneous vaginal delivery)    x 2  . Systemic lupus ([place])   . Thyroid disease    Patient Active Problem List   Diagnosis Date Noted  . Chronic diastolic heart failure ([place])   . Morbid obesity due to excess calories ([place])   . Bipolar disorder ([place])   . Cellulitis 11/29/2018  . OSA (obstructive sleep apnea) 11/27/2018  . Chronic hypoxemic respiratory failure  ([place]) 11/27/2018  . Primary osteoarthritis of both hands 04/19/2017  . Primary osteoarthritis of both knees 04/19/2017  . Primary osteoarthritis of both feet 04/19/2017  . DDD (degenerative disc disease), lumbar 04/19/2017  . History of bipolar disorder 04/19/2017  . Hypothyroidism 06/04/2014  . Essential hypertension 01/23/2013  . HLD (hyperlipidemia) 01/23/2013   Home Medication(s) Prior to Admission medications   Medication Sig Start Date End Date Taking? Authorizing Provider  acetaminophen (TYLENOL) 650 MG CR tablet Take 650 mg by mouth daily.     [provider]  Acetylcysteine 600 MG CAPS Take 600 mg by mouth 2 (two) times daily. NAC    [provider]  albuterol (VENTOLIN HFA) 108 (90 Base) MCG/ACT inhaler Inhale 2 puffs into the lungs every 6 (six) hours as needed for wheezing or shortness of breath. 02/27/20   Margaretha Seeds, MD  Carboxymeth-Glycerin-Polysorb (REFRESH OPTIVE ADVANCED OP) Apply to eye.    [provider]  cycloSPORINE (RESTASIS) 0.05 % ophthalmic emulsion Place 1 drop into both eyes 2 (two) times daily.    [provider]  DULoxetine (CYMBALTA) 60 MG capsule TAKE 1 CAPSULE BY MOUTH EVERY DAY 04/17/20   Cottle, Billey Co., MD  econazole nitrate 1 % cream Apply topically daily. 09/21/19   [provider]  gabapentin (NEURONTIN) 300 MG capsule 1 IN AM AND  3 AT [facility] 04/17/20   Cottle, Billey Co., MD  levothyroxine (SYNTHROID) 25 MCG tablet Take 25 mcg by mouth daily before breakfast.     [provider]  lithium carbonate 150 MG capsule TAKE 5 CAPSULES (750 MG TOTAL) BY MOUTH AT BEDTIME. 04/08/20 07/07/20  Cottle, Billey Co., MD  metoprolol tartrate (LOPRESSOR) 25 MG tablet Take 0.5 tablets (12.5 mg total) by mouth 2 (two) times daily. 12/11/18   Kayleen Memos, DO  Multiple Vitamins-Minerals (MULTIVITAMIN WITH MINERALS) tablet Take 1 tablet by mouth daily.    [provider]  Multiple Vitamins-Minerals  (PRESERVISION AREDS 2) CAPS Take 1 capsule 2 (two) times daily by mouth.    [provider]  potassium chloride SA (KLOR-CON) 20 MEQ tablet Take 1 tablet by mouth daily with Torsemide 12/25/19   O'Neal, Cassie Freer, MD  pravastatin (PRAVACHOL) 20 MG tablet Take 20 mg at bedtime by mouth.     [provider]  SYNTHROID 200 MCG tablet TAKE 1 TABLET BY MOUTH EVERY MORNING ON AN EMPTY STOMACH 12/28/18   [provider]  Thiamine HCl (VITAMIN B1) 100 MG TABS Take 1 tablet by mouth daily. 10/20/18   [provider]  torsemide (DEMADEX) 20 MG tablet Take 1 tab (20 mg) in the morning and may take an extra  1 tab (20 mg) in the afternoon if needed. 02/25/20   O'NealCassie Freer, MD  vitamin B-12 (CYANOCOBALAMIN) 100 MCG tablet Take 100 mcg by mouth 2 (two) times daily. 10/20/18   [provider]  Vitamin D, Ergocalciferol, (DRISDOL) 1.25 MG (50000 UNIT) CAPS capsule TAKE 1 CAPSULE (50,000 UNITS TOTAL) BY MOUTH EVERY 7 (SEVEN) DAYS. 11/21/19   Cottle, Billey Co., MD                                                                                                                                    Past Surgical History Past Surgical History:  Procedure Laterality Date  . CARPAL TUNNEL RELEASE Left 09/16/2017   Procedure: LEFT CARPAL TUNNEL RELEASE;  Surgeon: Daryll Brod, MD;  Location: [facility];  Service: Orthopedics;  Laterality: Left;  . carpel tunnel surgery Right   . CATARACT EXTRACTION Bilateral 2007   w/ lens implants  . COLONOSCOPY    . DILATION AND CURETTAGE OF UTERUS    . EYE SURGERY    . FOOT SURGERY Right    hammer toe  . FRACTURE SURGERY     R tibia and fibula  . LEG SURGERY Right   . TONSILLECTOMY    . WISDOM TOOTH EXTRACTION     Family History Family History  Problem Relation Age of Onset  . Thyroid disease Mother   . Breast cancer Mother   . Heart disease Mother   . Bipolar disorder Father   . Diabetes Father   . Heart disease Father    . Dementia Father  Social History Social History   Tobacco Use  . Smoking status: Never Smoker  . Smokeless tobacco: Never Used  Vaping Use  . Vaping Use: Never used  Substance Use Topics  . Alcohol use: Not Currently  . Drug use: Never   Allergies Ibuprofen, Codeine, Nabumetone, Other, Promethazine, Amoxicillin-pot clavulanate, Erythromycin base, and Promethazine-codeine  Review of Systems Review of Systems  Constitutional: Negative for fatigue and fever.  Respiratory: Negative for cough and shortness of breath.   Cardiovascular: Positive for chest pain. Negative for palpitations.  Gastrointestinal: Positive for nausea. Negative for abdominal pain and vomiting.  Musculoskeletal: Negative for back pain.   All other systems are reviewed and are negative for acute change except as noted in the HPI  Physical Exam Vital Signs  I have reviewed the triage vital signs BP 138/67   Pulse 78   Temp 98.6 F (37 C) (Oral)   Resp (!) 26   Ht 5\' 2"  (1.575 m)   Wt (!) 141.5 kg   SpO2 92%   BMI 57.07 kg/m   Physical Exam Vitals reviewed.  Constitutional:      General: She is not in acute distress.    Appearance: She is well-developed and well-nourished. She is obese. She is not diaphoretic.  HENT:     Head: Normocephalic and atraumatic.     Nose: Nose normal.  Eyes:     General: No scleral icterus.       Right eye: No discharge.        Left eye: No discharge.     Extraocular Movements: EOM normal.     Conjunctiva/sclera: Conjunctivae normal.     Pupils: Pupils are equal, round, and reactive to light.  Cardiovascular:     Rate and Rhythm: Normal rate and regular rhythm.     Heart sounds: No murmur heard. No friction rub. No gallop.   Pulmonary:     Effort: Pulmonary effort is normal. No respiratory distress.     Breath sounds: Normal breath sounds. No stridor. No rales.  Abdominal:     General: There is no distension.     Palpations: Abdomen is soft.      Tenderness: There is no abdominal tenderness.  Musculoskeletal:        General: No tenderness.     Cervical back: Normal range of motion and neck supple.     Right lower leg: 1+ Pitting Edema present.     Left lower leg: 1+ Pitting Edema present.  Skin:    General: Skin is warm and dry.     Findings: No erythema or rash.  Neurological:     Mental Status: She is alert and oriented to person, place, and time.  Psychiatric:        Mood and Affect: Mood and affect normal.     ED Results and Treatments Labs (all labs ordered are listed, but only abnormal results are displayed) Labs Reviewed  CBC - Abnormal; Notable for the following components:      Result Value   WBC 12.7 (*)    All other components within normal limits  COMPREHENSIVE METABOLIC PANEL  LIPASE, BLOOD  BRAIN NATRIURETIC PEPTIDE  TROPONIN I (HIGH SENSITIVITY)  EKG  EKG Interpretation  Date/Time:  Friday April 18 2020 05:38:49 EST Ventricular Rate:  80 PR Interval:    QRS Duration: 88 QT Interval:  383 QTC Calculation: 442 R Axis:   20 Text Interpretation: Sinus rhythm Low voltage, extremity and precordial leads No acute changes Confirmed by Addison Lank [id number]) on 04/18/2020 6:29:25 AM      Radiology No results found.  Pertinent labs & imaging results that were available during my care of the patient were reviewed by me and considered in my medical decision making (see chart for details).  Medications Ordered in ED Medications  ondansetron (ZOFRAN) injection 4 mg (4 mg Intravenous Given 04/18/20 0651)  alum & mag hydroxide-simeth (MAALOX/MYLANTA) 200-200-20 MG/5ML suspension 30 mL (30 mLs Oral Given 04/18/20 0651)    And  lidocaine (XYLOCAINE) 2 % viscous mouth solution 15 mL (15 mLs Oral Given 04/18/20 4496)                                                                                                                                     Procedures Procedures  (including critical care time)  Medical Decision Making / ED Course I have reviewed the nursing notes for this encounter and the patient's prior records (if available in EHR or on provided paperwork).   Naveyah Iacovelli Monnig was evaluated in Emergency Department on 04/18/2020 for the symptoms described in the history of present illness. She was evaluated in the context of the global COVID-19 pandemic, which necessitated consideration that the patient might be at risk for infection with the SARS-CoV-2 virus that causes COVID-19. Institutional protocols and algorithms that pertain to the evaluation of patients at risk for COVID-19 are in a state of rapid change based on information released by regulatory bodies including the CDC and federal and state organizations. These policies and algorithms were followed during the patient's care in the ED.  Atypical chest pain. EKG without acute ischemic changes or evidence of pericarditis. Low suspicion for ACS but will rule out with serial troponins x2.  Low suspicion for PE.  Presentation not classic for aortic dissection or esophageal perforation.   Chest x-ray without evidence suggestive of pneumonia, pneumothorax, pneumomediastinum.  No abnormal contour of the mediastinum to suggest dissection. No evidence of acute injuries.  If cardiac work up is negative, likely GI etiology. Provided with GI cocktail.  Patient care turned over to Dr Gilford Raid. Patient case and results discussed in detail; please see their note for further ED managment.       Final Clinical Impression(s) / ED Diagnoses Final diagnoses:  Chest pain      This chart was dictated using voice recognition software.  Despite best efforts to proofread,  errors can occur which can change the documentation meaning.   Fatima Blank, MD 04/18/20 [phone]

## 2020-04-18 NOTE — ED Provider Notes (Signed)
Pt signed out by Dr. Leonette Monarch pending labs.  2 troponins are negative.  EKG shows nothing acute.  Story sounds like GI origin as it occurred after eating raisin bran and yogurt.  Pt is stable for d/c.  Return if worse.  F/u with pcp.   Isla Pence, MD 04/18/20 1022

## 2020-04-18 NOTE — ED Notes (Signed)
Patient transported to x-ray. ?

## 2020-04-18 NOTE — ED Notes (Signed)
Patient returned from xray.

## 2020-04-19 DIAGNOSIS — S0502XD Injury of conjunctiva and corneal abrasion without foreign body, left eye, subsequent encounter: Secondary | ICD-10-CM | POA: Diagnosis not present

## 2020-04-19 DIAGNOSIS — H16223 Keratoconjunctivitis sicca, not specified as Sjogren's, bilateral: Secondary | ICD-10-CM | POA: Diagnosis not present

## 2020-04-19 DIAGNOSIS — H5022 Vertical strabismus, left eye: Secondary | ICD-10-CM | POA: Diagnosis not present

## 2020-04-19 DIAGNOSIS — H532 Diplopia: Secondary | ICD-10-CM | POA: Diagnosis not present

## 2020-04-19 DIAGNOSIS — H04123 Dry eye syndrome of bilateral lacrimal glands: Secondary | ICD-10-CM | POA: Diagnosis not present

## 2020-04-19 DIAGNOSIS — H11823 Conjunctivochalasis, bilateral: Secondary | ICD-10-CM | POA: Diagnosis not present

## 2020-04-20 ENCOUNTER — Telehealth: Payer: Self-pay | Admitting: Pulmonary Disease

## 2020-04-20 NOTE — Telephone Encounter (Signed)
Called by Jessica [name] d/t increasing SOB and "brathing heavy" tonight. She reports that her O2 saturation at rest on room air = 84%. This is a significant change from 04/08/2020 when she was seen in the PCCM clinic by Dr. Loanne Drilling (Sat on room air at rest = 92%). Her sats are improved on 4 L/min [place] to the low 90's. Unfortunately, the differential diagnosis includes heart failure, PE and infectious process which can't be evaluated properly over the telephone. I recommended that she go to the emergency room for further evaluation and possible hospital admission.

## 2020-04-21 ENCOUNTER — Emergency Department (HOSPITAL_COMMUNITY): Payer: Medicare Other

## 2020-04-21 ENCOUNTER — Telehealth: Payer: Self-pay | Admitting: Pulmonary Disease

## 2020-04-21 ENCOUNTER — Other Ambulatory Visit: Payer: Self-pay

## 2020-04-21 ENCOUNTER — Emergency Department (HOSPITAL_COMMUNITY)
Admission: EM | Admit: 2020-04-21 | Discharge: 2020-04-22 | Disposition: A | Discharge status: Home / Self Care | Payer: Medicare Other | Attending: Emergency Medicine | Admitting: Emergency Medicine

## 2020-04-21 ENCOUNTER — Ambulatory Visit: Payer: Medicare Other | Admitting: Psychiatry

## 2020-04-21 DIAGNOSIS — R0602 Shortness of breath: Secondary | ICD-10-CM | POA: Diagnosis not present

## 2020-04-21 DIAGNOSIS — M542 Cervicalgia: Secondary | ICD-10-CM | POA: Diagnosis not present

## 2020-04-21 DIAGNOSIS — Z5321 Procedure and treatment not carried out due to patient leaving prior to being seen by health care provider: Secondary | ICD-10-CM | POA: Diagnosis not present

## 2020-04-21 DIAGNOSIS — I10 Essential (primary) hypertension: Secondary | ICD-10-CM | POA: Diagnosis not present

## 2020-04-21 DIAGNOSIS — R52 Pain, unspecified: Secondary | ICD-10-CM | POA: Diagnosis not present

## 2020-04-21 DIAGNOSIS — R0902 Hypoxemia: Secondary | ICD-10-CM | POA: Diagnosis not present

## 2020-04-21 DIAGNOSIS — J811 Chronic pulmonary edema: Secondary | ICD-10-CM | POA: Diagnosis not present

## 2020-04-21 DIAGNOSIS — I517 Cardiomegaly: Secondary | ICD-10-CM | POA: Diagnosis not present

## 2020-04-21 DIAGNOSIS — I509 Heart failure, unspecified: Secondary | ICD-10-CM | POA: Insufficient documentation

## 2020-04-21 DIAGNOSIS — J90 Pleural effusion, not elsewhere classified: Secondary | ICD-10-CM | POA: Diagnosis not present

## 2020-04-21 LAB — BASIC METABOLIC PANEL
Anion gap: 7 (ref 5–15)
BUN: 20 mg/dL (ref 8–23)
CO2: 28 mmol/L (ref 22–32)
Calcium: 10.4 mg/dL — ABNORMAL HIGH (ref 8.9–10.3)
Chloride: 103 mmol/L (ref 98–111)
Creatinine, Ser: 0.9 mg/dL (ref 0.44–1.00)
GFR, Estimated: >60 mL/min (ref >60)
Glucose, Bld: 111 mg/dL — ABNORMAL HIGH (ref 70–99)
Potassium: 4.7 mmol/L (ref 3.5–5.1)
Sodium: 138 mmol/L (ref 135–145)

## 2020-04-21 LAB — CBC
HCT: 41.1 % (ref 36.0–46.0)
Hemoglobin: 12.6 g/dL (ref 12.0–15.0)
MCH: 31 pg (ref 26.0–34.0)
MCHC: 30.7 g/dL (ref 30.0–36.0)
MCV: 101.2 fL — ABNORMAL HIGH (ref 80.0–100.0)
Platelets: 348 10*3/uL (ref 150–400)
RBC: 4.06 MIL/uL (ref 3.87–5.11)
RDW: 14.3 % (ref 11.5–15.5)
WBC: 12.2 10*3/uL — ABNORMAL HIGH (ref 4.0–10.5)
nRBC: 0 % (ref 0.0–0.2)

## 2020-04-21 LAB — TROPONIN I (HIGH SENSITIVITY)
Troponin I (High Sensitivity): 14 ng/L (ref <18)
Troponin I (High Sensitivity): 10 ng/L (ref <18)

## 2020-04-21 NOTE — ED Notes (Signed)
Spoken to pt multiple times concerning the wait. Pt was very frustrated and felt that an exception should be made for her situation. This Probation officer thoroughly explained the systems in place to move pt back. However, pt was increasingly more frustrated. Pt has now decided to leave stating "you know the reason why." Pt was encouraged to stay, but pt refused.

## 2020-04-21 NOTE — ED Triage Notes (Signed)
C/O shortness of breath on exertion. States uses o2 at HS d/t CHF, but she's been using it during the day as well. Recently seen for same issue, back today for further eval.

## 2020-04-21 NOTE — Telephone Encounter (Signed)
Spoke with the pt She called 04/20/20 and spoke with Dr Oletta Darter:  Oletta Darter Virgina Evener, MD     9:33 PM Note Called by Stanton Kidney d/t increasing SOB and "brathing heavy" tonight. She reports that her O2 saturation at rest on room air = 84%. This is a significant change from 04/08/2020 when she was seen in the PCCM clinic by Dr. Loanne Drilling (Sat on room air at rest = 92%). Her sats are improved on 4 L/min  to the low 90's. Unfortunately, the differential diagnosis includes heart failure, PE and infectious process which can't be evaluated properly over the telephone. I recommended that she go to the emergency room for further evaluation and possible hospital admission.     Pt never went to ED as instructed Pt states this morning when she woke up her BP was 188/110  Then she checked again an hour or so later and it was 105/107  She just checked before speaking with me at 10:45 AM and it is down to 145/78 She is having less SOB than she was yesterday- has been using o2 ever since spoke with Dr Gevena Barre 4lpm and sat now 90-92% I asked if she was having CP and she states "not really"- would not elaborate and states  "I just don't feel good" She also reports some blurred/double vision- had eye injury and was seen for this on 04/14/20 but unsure what is causing this  She states that she wants to know if still needs to report to ED b/c does not want to go unless MD feels still necessary  Please advise, thanks!

## 2020-04-21 NOTE — Telephone Encounter (Signed)
Called patient. Advised her to present to the ED for worsening O2 requirement and report of worsening lower extremity edema.   Rodman Pickle, M.D. [facility] Pulmonary/Critical Care Medicine 04/21/2020 11:39 AM

## 2020-04-21 NOTE — Telephone Encounter (Signed)
Pt did not go to the ED after being recommended to go by Dr. Oletta Darter.  Called pt to check to see how she was doing after speaking with Dr. Oletta Darter overnight 12/19.  Pt said that she is not doing that well as she woke up with a bad headache. Pt said that when she checked her BP, it was 188/110. Pt also said that her O2 sats have been on the lower side being at 90% on 4L O2 and off of O2, being at 82%.  Stated to pt that she did need to go to ED to be evaluated and she verbalized understanding.  Routing to Dr. Loanne Drilling as an Juluis Rainier. Will also check later to see if pt did go to ED as recommended.

## 2020-04-22 ENCOUNTER — Other Ambulatory Visit: Payer: Self-pay | Admitting: Psychiatry

## 2020-04-22 ENCOUNTER — Ambulatory Visit (INDEPENDENT_AMBULATORY_CARE_PROVIDER_SITE_OTHER): Payer: Medicare Other | Admitting: Pulmonary Disease

## 2020-04-22 ENCOUNTER — Encounter: Payer: Self-pay | Admitting: Pulmonary Disease

## 2020-04-22 ENCOUNTER — Telehealth: Payer: Self-pay | Admitting: Pulmonary Disease

## 2020-04-22 VITALS — BP 128/78 | HR 86 | Temp 97.1°F | Ht 62.0 in | Wt 310.4 lb

## 2020-04-22 DIAGNOSIS — I5033 Acute on chronic diastolic (congestive) heart failure: Secondary | ICD-10-CM

## 2020-04-22 DIAGNOSIS — R7989 Other specified abnormal findings of blood chemistry: Secondary | ICD-10-CM

## 2020-04-22 DIAGNOSIS — J9621 Acute and chronic respiratory failure with hypoxia: Secondary | ICD-10-CM | POA: Diagnosis not present

## 2020-04-22 MED ORDER — IPRATROPIUM BROMIDE HFA 17 MCG/ACT IN AERS: 2.0000 | INHALATION_SPRAY | RESPIRATORY_TRACT | 12 refills | Status: DC | PRN

## 2020-04-22 NOTE — Telephone Encounter (Signed)
pt is calling in stating that she is not happy because we told her to go to the ED - the on call, a nurse and Dr. Loanne Drilling all told her to go - states that she did not go and then finally did - states that she was "never seen" but did labs and EKG etc. She would like for [place] to know that she has lab work and tests in the [facility] system for [place] to look at. She would like to talk to an Scientist, physiological - she wants to be seen - TODAY - she wants to talk about the lack of care that she has received from [facility]    States that her BP cuff is too small for her arm - had to use the fire station cuff and states that the top number was over stated by about 20 points -   Pt wants to know what we are going to do for her - states that she feels that we are "shucking" her off - states "please don't send her to the ED" again because she has wasted one free trip in an ambulance ride and 9 hours of her time -

## 2020-04-22 NOTE — Patient Instructions (Signed)
Acute on chronic hypoxemic respiratory failure secondary chronic diastolic heart failure  CONTINUE supplemental oxygen with exertion for >88% Did not tolerate albuterol due to insomnia START Atrovent inhaler TWO puffs every four hours as needed Recommend taking additional torsemide x 2 days

## 2020-04-22 NOTE — Progress Notes (Signed)
Subjective:   PATIENT ID: Jessica [name] GENDER: female DOB: [birth year], MRN: [id number]   HPI  Chief Complaint  Patient presents with  . Follow-up    Pt spoke with JE this morning and scheduled to be evaluated in the office today.    Reason for Visit: Follow-up   [name] is a 69 year old female never smoker with chronic hypoxemic respiratory failure, OSA on CPAP, spinal stenosis and lupus who presents for follow-up.  Since our last visit on 02/27/20, she overall has had reasonable control of her respiratory symptoms however in the last 3-4 days she developed shortness of breath, lower extremity edema and need to restart her oxygen continuously including at rest, which is new for her. She reports low O2 saturations to 82% on room air which would improve after increasing her O2 to 4L. She expressed concern about taking additional diuretic due to her worsening dyspnea with ambulation and was afraid she could not make it to the bathroom in time. She contacted Pulmonary on-call over the weekend and was advised to present to the ED which she declined until she spoke with me the following day. However after arriving to the ED, she was in the waiting room for >10 hours and decided to leave. She eventually took an additional torsemide and reports improvement in her dyspnea and oxygen currently at 2L O2. Denies cough, wheezing or chest pain. She has not been able to use her albuterol inhaler due to insomnia and palpitations so she stopped.  Social History: Never smoker  I have personally reviewed patient's past medical/family/social history/allergies/current medications. Past Medical History:  Diagnosis Date  . Arthritis    lower back, hands, knees  . Bipolar disorder ([place])   . Cellulitis and abscess of left leg 11/29/2018  . CHF (congestive heart failure) ([place])   . Depression   . Dyspnea    with exertion  . GERD (gastroesophageal reflux disease)    patient unsure about  this dx - no meds  . Headache   . History of IBS    watches diet  . Hyperlipidemia   . Hypertension   . Hypothyroidism   . Lung edema, acute, with congestive heart failure ([place])   . Obese   . Plantar fasciitis   . Seasonal allergies   . Sleep apnea 2017   uses CPAP   . Spinal stenosis of lumbar region   . SVD (spontaneous vaginal delivery)    x 2  . Systemic lupus ([place])   . Thyroid disease      Outpatient Medications Prior to Visit  Medication Sig Dispense Refill  . acetaminophen (TYLENOL) 650 MG CR tablet Take 650 mg by mouth daily.    . Acetylcysteine 600 MG CAPS Take 600 mg by mouth 2 (two) times daily. NAC    . albuterol (VENTOLIN HFA) 108 (90 Base) MCG/ACT inhaler Inhale 2 puffs into the lungs every 6 (six) hours as needed for wheezing or shortness of breath. 8 g 6  . Carboxymeth-Glycerin-Polysorb (REFRESH OPTIVE ADVANCED OP) Apply to eye.    . cycloSPORINE (RESTASIS) 0.05 % ophthalmic emulsion Place 1 drop into both eyes 2 (two) times daily.    . DULoxetine (CYMBALTA) 60 MG capsule TAKE 1 CAPSULE BY MOUTH EVERY DAY 90 capsule 0  . econazole nitrate 1 % cream Apply topically daily.    . famotidine (PEPCID) 20 MG tablet Take 1 tablet (20 mg total) by mouth daily. 30 tablet 0  . gabapentin (NEURONTIN)  300 MG capsule 1 IN AM AND 3 AT EVENING 360 capsule 1  . levothyroxine (SYNTHROID) 25 MCG tablet Take 25 mcg by mouth daily before breakfast.     . lithium carbonate 150 MG capsule TAKE 5 CAPSULES (750 MG TOTAL) BY MOUTH AT BEDTIME. 450 capsule 0  . metoprolol tartrate (LOPRESSOR) 25 MG tablet Take 0.5 tablets (12.5 mg total) by mouth 2 (two) times daily. 60 tablet 0  . Multiple Vitamins-Minerals (MULTIVITAMIN WITH MINERALS) tablet Take 1 tablet by mouth daily.    . Multiple Vitamins-Minerals (PRESERVISION AREDS 2) CAPS Take 1 capsule 2 (two) times daily by mouth.    . neomycin-polymyxin b-dexamethasone (MAXITROL) 3.5-10000-0.1 SUSP Place into the left eye.    . potassium  chloride SA (KLOR-CON) 20 MEQ tablet Take 1 tablet by mouth daily with Torsemide 90 tablet 1  . pravastatin (PRAVACHOL) 20 MG tablet Take 20 mg at bedtime by mouth.     . SYNTHROID 200 MCG tablet TAKE 1 TABLET BY MOUTH EVERY MORNING ON AN EMPTY STOMACH    . Thiamine HCl (VITAMIN B1) 100 MG TABS Take 1 tablet by mouth daily.    Marland Kitchen torsemide (DEMADEX) 20 MG tablet Take 1 tab (20 mg) in the morning and may take an extra  1 tab (20 mg) in the afternoon if needed. 180 tablet 3  . vitamin B-12 (CYANOCOBALAMIN) 100 MCG tablet Take 100 mcg by mouth 2 (two) times daily.    . Vitamin D, Ergocalciferol, (DRISDOL) 1.25 MG (50000 UNIT) CAPS capsule TAKE 1 CAPSULE (50,000 UNITS TOTAL) BY MOUTH EVERY 7 (SEVEN) DAYS. 12 capsule 2   No facility-administered medications prior to visit.    Review of Systems  Constitutional: Positive for malaise/fatigue. Negative for chills, diaphoresis, fever and weight loss.  HENT: Negative for congestion.   Respiratory: Positive for shortness of breath. Negative for cough, hemoptysis, sputum production and wheezing.   Cardiovascular: Positive for leg swelling. Negative for chest pain and palpitations.    Objective:   Vitals:   04/22/20 1549  BP: 128/78  Pulse: 86  Temp: (!) 97.1 F (36.2 C)  TempSrc: Tympanic  SpO2: 98%  Weight: (!) 310 lb 6 oz (140.8 kg)  Height: 5\' 2"  (1.575 m)   SpO2: 98 % (2 liters)  Physical Exam: General: Morbidly obese, no acute distress HENT: [place], AT Eyes: EOMI, no scleral icterus Respiratory: Diminished breath sounds bilaterally.  No crackles, wheezing or rales Cardiovascular: RRR, -M/R/G, no JVD Extremities:1+ pitting edema in lower extremities L>R,-tenderness Neuro: AAO x4, CNII-XII grossly intact Skin: Intact, no rashes or bruising Psych: Normal mood, flat affect  Data Reviewed:  Imaging: CXR 11/16/18 - No infiltrate, edema or effusion. Enlarged cardiac silhouette CXR 12/09/18 - Cardiomegaly, bibasilar  atelectasis  PFT: 01/29/19 FVC 0.61 (21%) FEV1 0.61 (28%) Ratio 100 Interpretation: No obstructive defect. Reduced FVC and FEV1 suggestive of restrictive defect  Labs: CBC    Component Value Date/Time   WBC 12.2 (H) 04/21/2020 1446   RBC 4.06 04/21/2020 1446   HGB 12.6 04/21/2020 1446   HCT 41.1 04/21/2020 1446   PLT 348 04/21/2020 1446   MCV 101.2 (H) 04/21/2020 1446   MCH 31.0 04/21/2020 1446   MCHC 30.7 04/21/2020 1446   RDW 14.3 04/21/2020 1446   LYMPHSABS 2.0 12/11/2018 0753   MONOABS 1.7 (H) 12/11/2018 0753   EOSABS 0.5 12/11/2018 0753   BASOSABS 0.1 12/11/2018 0753   CMP     Component Value Date/Time   NA 138 04/21/2020 1446  NA 138 02/25/2020 1549   K 4.7 04/21/2020 1446   CL 103 04/21/2020 1446   CO2 28 04/21/2020 1446   GLUCOSE 111 (H) 04/21/2020 1446   BUN 20 04/21/2020 1446   BUN 21 02/25/2020 1549   CREATININE 0.90 04/21/2020 1446   CREATININE 0.93 11/23/2019 1629   CALCIUM 10.4 (H) 04/21/2020 1446   PROT 6.1 (L) 04/18/2020 0645   PROT 6.6 04/16/2019 1613   ALBUMIN 3.3 (L) 04/18/2020 0645   ALBUMIN 4.0 04/16/2019 1613   AST 19 04/18/2020 0645   ALT 16 04/18/2020 0645   ALKPHOS 119 04/18/2020 0645   BILITOT 0.2 (L) 04/18/2020 0645   BILITOT 0.2 04/16/2019 1613   GFRNONAA >60 04/21/2020 1446   GFRNONAA 66 05/15/2018 1118   GFRAA 83 02/25/2020 1549   GFRAA 76 05/15/2018 1118   Sleep Study: 02/2016 - AHI 133  Ambulatory O2 11/22/18 Patient Saturations on Room Air at Rest = 88% Patient Saturations on Room Air while Ambulating = 88% Patient Saturations on 4 Liters of oxygen while Ambulating = 91%  Recommend 2L of O2 at rest, 4L of O2 with exertion  Ambulatory O2 04/09/19  Patient Saturations on Room Air at Rest = 92% Patient Saturations on Room Air while Ambulating = 87% Patient Saturations on 2Liters of oxygen while Ambulating = 95%  Recommend 0L of O2 via nasal cannula at rest  Recommend 2L of O2 via nasal cannula with exertion and  sleep  TTE  12/02/18 - EF 60-65%. Diastolic heart failure, mildly decreased RV systolic function. Aortic and mitral annular calcification.  Imaging, labs and test noted above have been reviewed independently by me.    Assessment & Plan:   Discussion: 69 year old female with morbid obesity, OSA, chronic diastolic heart failure who presents for acute on chronic hypoxemic respiratory failure. Mild volume overload on exam. Likely has undiagnosed pulmonary hypertension contributing to her hypoxemia. Deconditioning remains a significant factor in her dyspnea.   Acute on chronic hypoxemic respiratory failure  Acute on chronic diastolic heart failure  CONTINUE supplemental oxygen with exertion for >88% Did not tolerate albuterol due to insomnia. Will discontinue med START Atrovent inhaler TWO puffs every four hours as needed Recommend taking additional torsemide x 2 days  Chronic diastolic heart failure CONTINUE torsemide 20 mg daily per Cardiology.   Obstructive sleep apnea Continue CPAP per Sleep  Chronic pain Followed by Rheumatology Unable to refer to Pain Clinic due to prior interaction with the referral practice. I updated patient on this.  Morbid Obesity  No orders of the defined types were placed in this encounter.  Meds ordered this encounter  Medications  . ipratropium (ATROVENT HFA) 17 MCG/ACT inhaler    Sig: Inhale 2 puffs into the lungs every 4 (four) hours as needed for wheezing.    Dispense:  1 each    Refill:  12   Return in about 1 month (around 05/23/2020).   I have spent a total time of 30-minutes on the day of the appointment reviewing prior documentation, coordinating care and discussing medical diagnosis and plan with the patient/family. Imaging, labs and tests included in this note have been reviewed and interpreted independently by me.  [place], MD [facility] 04/22/2020 4:14 PM  Office Number [phone]

## 2020-04-22 NOTE — Telephone Encounter (Signed)
Called spoke with patient.  She was upset the ED didn't do anything for her. She felt that if Dr. Loanne Drilling and the "female doctor" she spoke to on Sunday thought she needed to go to the ED, why didn't the ED think she was critical enough to be taken care of. Patient states they took some lab work then put her in another waiting room where patient sat for hours and wasn't taken care of , when she asked the time frame she would be waiting they couldn't tell her. She felt they did not think she was in serious need of help so she left. She states she is bipolar and needs to take her meds and sleep she doesn't do well when she hasn't done this. Patient is still inquiring what she needs to do.  Dr. Loanne Drilling has openings and patient was scheduled for 3:15 today.   I will route this to [place] as FYI and if there needs to be any changes in the recommendations before coming in for a visit.

## 2020-04-23 ENCOUNTER — Ambulatory Visit: Payer: Medicare Other | Admitting: Psychiatry

## 2020-04-24 ENCOUNTER — Other Ambulatory Visit: Payer: Self-pay | Admitting: Physician Assistant

## 2020-04-24 ENCOUNTER — Telehealth: Payer: Self-pay | Admitting: Psychiatry

## 2020-04-24 MED ORDER — LORAZEPAM 0.5 MG PO TABS: 0.5000 mg | ORAL_TABLET | Freq: Three times a day (TID) | ORAL | 0 refills | Status: DC | PRN

## 2020-04-24 NOTE — Telephone Encounter (Signed)
Rx was sent  

## 2020-04-24 NOTE — Telephone Encounter (Signed)
[facility] called Thursday am, while phones were not turned over to voice mail. She asked for a RF of Lorazepam, which is something she got about 10 pills of about a year ago from Dr. Clovis Pu. She is feeling a little agitated and was hoping she could get this filled.  Uses [facility] on [address]

## 2020-04-28 ENCOUNTER — Telehealth: Payer: Self-pay

## 2020-04-28 ENCOUNTER — Telehealth: Payer: Self-pay | Admitting: Psychiatry

## 2020-04-28 NOTE — Telephone Encounter (Signed)
RTC  CO hallucinations at night from taking Vraylar  Off Vaylar for 2-3 weeks and only took 3 doses.   Last view days at night after sleep 1/2 awake has hallucinations of things centered on current events.  ER twice in last couple of days.  Once for CP and other for htn and SOB. No hallucinations in the day.  Explained hypnogogic and hyponopompic hallucinations can be related to trauma but are not truly psychotic.  She has no other psychotic sx and these are unnecessary to treat and likely to resolve with time.  Se agrees.  Meredith Staggers, MD, DFAPA

## 2020-04-28 NOTE — Telephone Encounter (Signed)
Prior Authorization submitted for Lorazepam 0.5 mg effective 03/29/2020-04/28/2021 with [facility], PA# [id number]

## 2020-05-01 ENCOUNTER — Ambulatory Visit: Payer: Medicare Other | Admitting: Physical Therapy

## 2020-05-07 ENCOUNTER — Encounter: Payer: Self-pay | Admitting: Psychiatry

## 2020-05-07 ENCOUNTER — Ambulatory Visit (INDEPENDENT_AMBULATORY_CARE_PROVIDER_SITE_OTHER): Payer: Medicare Other | Admitting: Psychiatry

## 2020-05-07 ENCOUNTER — Other Ambulatory Visit: Payer: Self-pay

## 2020-05-07 DIAGNOSIS — F900 Attention-deficit hyperactivity disorder, predominantly inattentive type: Secondary | ICD-10-CM

## 2020-05-07 DIAGNOSIS — R7989 Other specified abnormal findings of blood chemistry: Secondary | ICD-10-CM | POA: Diagnosis not present

## 2020-05-07 DIAGNOSIS — Z79899 Other long term (current) drug therapy: Secondary | ICD-10-CM

## 2020-05-07 DIAGNOSIS — F411 Generalized anxiety disorder: Secondary | ICD-10-CM | POA: Diagnosis not present

## 2020-05-07 DIAGNOSIS — G4733 Obstructive sleep apnea (adult) (pediatric): Secondary | ICD-10-CM | POA: Diagnosis not present

## 2020-05-07 DIAGNOSIS — F3162 Bipolar disorder, current episode mixed, moderate: Secondary | ICD-10-CM | POA: Diagnosis not present

## 2020-05-07 NOTE — Patient Instructions (Signed)
Take Latuda one half of a 20 mg tablet with at least 350 cal daily for 2 weeks.  If there is no benefit then increase to 1 tablet daily
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
## 2020-05-12 ENCOUNTER — Telehealth: Payer: Self-pay | Admitting: Cardiovascular Disease

## 2020-05-12 NOTE — Telephone Encounter (Signed)
Atypical antipsychotics can increase lipids, blood sugar and TG. Small chance of increasing blood pressure.  Ok to use, would just monitor for metabolic syndrome

## 2020-05-12 NOTE — Telephone Encounter (Signed)
Will forward to pharm md and dr Audie Box to review and advise.

## 2020-05-12 NOTE — Telephone Encounter (Signed)
Pt c/o medication issue:  1. Name of Medication: Latuda 20mg   2. How are you currently taking this medication (dosage and times per day)? N/a   3. Are you having a reaction (difficulty breathing--STAT)? no  4. What is your medication issue? Patient's psychiatrist wants to put her on this medication. She wants to make sure it does not interact with any of her cardiac medications and what Dr. Audie Box thinks from a cardiac standpoint. Please advise.

## 2020-05-12 NOTE — Telephone Encounter (Signed)
No issues on my end. Will await pharmacy final review.   [place] T. Audie Box, [place]  [address] [place], [place] [address] [phone]  2:04 PM

## 2020-05-12 NOTE — Telephone Encounter (Signed)
Spoke with pt, aware of recommendations. 

## 2020-05-13 ENCOUNTER — Telehealth: Payer: Self-pay | Admitting: Psychiatry

## 2020-05-13 DIAGNOSIS — Z23 Encounter for immunization: Secondary | ICD-10-CM | POA: Diagnosis not present

## 2020-05-13 DIAGNOSIS — E039 Hypothyroidism, unspecified: Secondary | ICD-10-CM | POA: Diagnosis not present

## 2020-05-13 DIAGNOSIS — F3175 Bipolar disorder, in partial remission, most recent episode depressed: Secondary | ICD-10-CM | POA: Diagnosis not present

## 2020-05-13 DIAGNOSIS — N3281 Overactive bladder: Secondary | ICD-10-CM | POA: Diagnosis not present

## 2020-05-13 DIAGNOSIS — R609 Edema, unspecified: Secondary | ICD-10-CM | POA: Diagnosis not present

## 2020-05-13 DIAGNOSIS — I10 Essential (primary) hypertension: Secondary | ICD-10-CM | POA: Diagnosis not present

## 2020-05-13 DIAGNOSIS — R202 Paresthesia of skin: Secondary | ICD-10-CM | POA: Diagnosis not present

## 2020-05-13 DIAGNOSIS — M5459 Other low back pain: Secondary | ICD-10-CM | POA: Diagnosis not present

## 2020-05-13 DIAGNOSIS — E7800 Pure hypercholesterolemia, unspecified: Secondary | ICD-10-CM | POA: Diagnosis not present

## 2020-05-13 NOTE — Telephone Encounter (Signed)
At the same time as eating or within an hour.

## 2020-05-13 NOTE — Telephone Encounter (Signed)
Pt taking Latuda. How close to a meal can she take it?

## 2020-05-20 ENCOUNTER — Telehealth: Payer: Self-pay | Admitting: Psychiatry

## 2020-05-20 NOTE — Telephone Encounter (Signed)
Pt feels that Anette Guarneri is helping a little bit, but she wants to decrease from 20mg  to 10mg  . Is this ok?

## 2020-05-21 NOTE — Telephone Encounter (Signed)
No.  Latuda 20 mg is a low dose and she hasn't given it any time to work.  The normal depression dose is 40 mg and normal bipolar dose is 80 mg daily

## 2020-05-21 NOTE — Telephone Encounter (Signed)
Pt called again and wants to now if she can decrease the latuda to 10 mg?

## 2020-05-22 ENCOUNTER — Telehealth: Payer: Self-pay | Admitting: Psychiatry

## 2020-05-22 NOTE — Telephone Encounter (Signed)
Here's a quote from my appt with her on 05/08/19 before starting Latuda: "Low energy and sleeps a lot daytime and interrupted at night. "   The problem she's describing predated Latuda.  She needs to stay on Latuda 20 mg and give it more time.  That is the right medical thing to do.  Give herself time to adjust to it.

## 2020-05-22 NOTE — Telephone Encounter (Signed)
Rtc to patient and she reports taking her Latuda 20 mg at dinner time, she is still having periods of wakefulness about 2-3 times during the night she will wake up and can be awake from 1-2 hours then goes back to sleep. During the day she reports having these sudden sense of sleepiness and has to go lay down and falls asleep 1-2 hours. Asking what can be done or adjusted? Feels it's related to her Latuda.  Informed her I would discuss with Dr. Clovis Pu and follow up with her

## 2020-05-22 NOTE — Telephone Encounter (Signed)
Jessica [name] called back about wanting to decrease her dose of Latuda.  I told her no, that she needed to give it more time and explained that 20mg  is a low dose and gave her the normal doses.  Her problem is that the [place] is causing her extreme sleepiness.  She is sleeping a lot because she can't stay awake. Please call her with instructions about what she can do to help with the sleepiness.

## 2020-05-22 NOTE — Telephone Encounter (Signed)
Patient aware, see next message

## 2020-05-24 NOTE — Progress Notes (Signed)
Cardiology Office Note:   Date:  05/27/2020  NAME:  Jessica [name]    MRN: [id number] DOB:  Mar 29, 1951   PCP:  Alroy Dust, L.Marlou Sa, MD  Cardiologist:  Evalina Field, MD   Referring MD: Aurea Graff.Marlou Sa, MD   Chief Complaint  Patient presents with  . Congestive Heart Failure    History of Present Illness:   Jessica [name] is a 70 y.o. female with a hx of morbid obesity, HFpEF, HTN who presents for follow-up.  She had 2 ER visits since her last appointment.  She reports she was seen in the emergency room in December for chest pain.  Described a burning sensation in her chest after eating yogurt and other items she usually does not eat.  Symptoms improved after several hours.  Troponins are negative in the emergency room.  EKG was unchanged from prior.  She was prescribed famotidine but she has not taken this.  The subsequent few days she noticed her oxygen levels were low.  He was evaluated by pulmonary on the phone and instructed by the emergency room.  She went back and work-up was largely negative.  Chest x-ray did reveal vascular congestion.  Oxygen level 92% on room air.  Review of weights show she is up 3 and 20 pounds.  She left the hospital roughly 18 months ago and was down to 280 pounds.  Weights have steadily climbed over the last few months.  She is taking torsemide 20 mg daily with an extra dose as needed.  She suffers from incontinence and this is quite difficult for her to take water pills often.  She reports her diet has not been the best recently either.  She is working on this.  BP 149/89 in office.  Pulse 87.  She denies any further chest pain episodes.  Is avoiding certain foods as well.  She also reports she is getting short of breath.  She has increased lower extremity edema.  We discussed hospital admission for IV diuresis versus aggressive outpatient therapy.  Do the coronavirus pandemic she would like to try this at home.  I did discuss with her that we will  increase her torsemide to 40 mg a standing dose.  She will take an extra 20 mg in the afternoon.  She will continue to go on to 40 mg moving forward.  Problem List: 1. HFpEF -60-65%, G1DD 2. RV Failure 2/2 OSA/OHS 3. Morbid Obesity(BMI 53) 4. HTN  Past Medical History: Past Medical History:  Diagnosis Date  . Arthritis    lower back, hands, knees  . Bipolar disorder ([place])   . Cellulitis and abscess of left leg 11/29/2018  . CHF (congestive heart failure) ([place])   . Depression   . Dyspnea    with exertion  . GERD (gastroesophageal reflux disease)    patient unsure about this dx - no meds  . Headache   . History of IBS    watches diet  . Hyperlipidemia   . Hypertension   . Hypothyroidism   . Lung edema, acute, with congestive heart failure ([place])   . Obese   . Plantar fasciitis   . Seasonal allergies   . Sleep apnea 2017   uses CPAP   . Spinal stenosis of lumbar region   . SVD (spontaneous vaginal delivery)    x 2  . Systemic lupus ([place])   . Thyroid disease     Past Surgical History: Past Surgical History:  Procedure Laterality Date  . CARPAL  TUNNEL RELEASE Left 09/16/2017   Procedure: LEFT CARPAL TUNNEL RELEASE;  Surgeon: Daryll Brod, MD;  Location: [facility];  Service: Orthopedics;  Laterality: Left;  . carpel tunnel surgery Right   . CATARACT EXTRACTION Bilateral 2007   w/ lens implants  . COLONOSCOPY    . DILATION AND CURETTAGE OF UTERUS    . EYE SURGERY    . FOOT SURGERY Right    hammer toe  . FRACTURE SURGERY     R tibia and fibula  . LEG SURGERY Right   . TONSILLECTOMY    . WISDOM TOOTH EXTRACTION      Current Medications: Current Meds  Medication Sig  . acetaminophen (TYLENOL) 650 MG CR tablet Take 650 mg by mouth daily.  . Acetylcysteine 600 MG CAPS Take 600 mg by mouth 2 (two) times daily. NAC  . albuterol (VENTOLIN HFA) 108 (90 Base) MCG/ACT inhaler Inhale 2 puffs into the lungs every 6 (six) hours as needed for wheezing or shortness of breath.  .  Carboxymeth-Glycerin-Polysorb (REFRESH OPTIVE ADVANCED OP) Apply to eye.  . cycloSPORINE (RESTASIS) 0.05 % ophthalmic emulsion Place 1 drop into both eyes 2 (two) times daily.  . DULoxetine (CYMBALTA) 60 MG capsule TAKE 1 CAPSULE BY MOUTH EVERY DAY  . econazole nitrate 1 % cream Apply topically daily.  Marland Kitchen gabapentin (NEURONTIN) 300 MG capsule 1 IN AM AND 3 AT EVENING  . ipratropium (ATROVENT HFA) 17 MCG/ACT inhaler Inhale 2 puffs into the lungs every 4 (four) hours as needed for wheezing.  Marland Kitchen levothyroxine (SYNTHROID) 25 MCG tablet Take 25 mcg by mouth daily before breakfast.   . lithium carbonate 150 MG capsule TAKE 5 CAPSULES (750 MG TOTAL) BY MOUTH AT BEDTIME.  . LORazepam (ATIVAN) 0.5 MG tablet Take 1 tablet (0.5 mg total) by mouth every 8 (eight) hours as needed for anxiety.  Marland Kitchen lurasidone (LATUDA) 20 MG TABS tablet 2 tablets with food  . metoprolol tartrate (LOPRESSOR) 25 MG tablet Take 0.5 tablets (12.5 mg total) by mouth 2 (two) times daily.  . Multiple Vitamins-Minerals (MULTIVITAMIN WITH MINERALS) tablet Take 1 tablet by mouth daily.  . Multiple Vitamins-Minerals (PRESERVISION AREDS 2) CAPS Take 1 capsule 2 (two) times daily by mouth.  . neomycin-polymyxin b-dexamethasone (MAXITROL) 3.5-10000-0.1 SUSP Place into the left eye.  . potassium chloride SA (KLOR-CON) 20 MEQ tablet Take 1 tablet by mouth daily with Torsemide  . pravastatin (PRAVACHOL) 20 MG tablet Take 20 mg at bedtime by mouth.   . SYNTHROID 200 MCG tablet TAKE 1 TABLET BY MOUTH EVERY MORNING ON AN EMPTY STOMACH  . Thiamine HCl (VITAMIN B1) 100 MG TABS Take 1 tablet by mouth daily.  . vitamin B-12 (CYANOCOBALAMIN) 100 MCG tablet Take 100 mcg by mouth 2 (two) times daily.  . Vitamin D, Ergocalciferol, (DRISDOL) 1.25 MG (50000 UNIT) CAPS capsule TAKE 1 CAPSULE (50,000 UNITS TOTAL) BY MOUTH EVERY 7 (SEVEN) DAYS.  . [DISCONTINUED] torsemide (DEMADEX) 20 MG tablet Take 1 tab (20 mg) in the morning and may take an extra  1 tab (20 mg)  in the afternoon if needed.     Allergies:    Ibuprofen, Codeine, Nabumetone, Other, Promethazine, Amoxicillin-pot clavulanate, Erythromycin base, and Promethazine-codeine   Social History: Social History   Socioeconomic History  . Marital status: Married    Spouse name: Not on file  . Number of children: Not on file  . Years of education: Not on file  . Highest education level: Not on file  Occupational History  .  Not on file  Tobacco Use  . Smoking status: Never Smoker  . Smokeless tobacco: Never Used  Vaping Use  . Vaping Use: Never used  Substance and Sexual Activity  . Alcohol use: Not Currently  . Drug use: Never  . Sexual activity: Not on file  Other Topics Concern  . Not on file  Social History Narrative   She lives in a single level home with her husband.  She has two grown children.   She taught college accounting courses, retired in 2005.    Highest level of education:  Designer, jewellery in [employer]   Social Determinants of Health   Financial Resource Strain: Not on file  Food Insecurity: Not on file  Transportation Needs: Not on file  Physical Activity: Not on file  Stress: Not on file  Social Connections: Not on file     Family History: The patient's family history includes Bipolar disorder in her father; Breast cancer in her mother; Dementia in her father; Diabetes in her father; Heart disease in her father and mother; Thyroid disease in her mother.  ROS:   All other ROS reviewed and negative. Pertinent positives noted in the HPI.     EKGs/Labs/Other Studies Reviewed:   The following studies were personally reviewed by me today:  TTE 12/02/2018 1. The left ventricle has normal systolic function with an ejection  fraction of 60-65%. The cavity size was normal. There is mild concentric  left ventricular hypertrophy. Left ventricular diastolic Doppler  parameters are consistent with impaired  relaxation. Indeterminate filling pressures.  2. The right  ventricle has mildly reduced systolic function. The cavity  was mildly enlarged. There is no increase in right ventricular wall  thickness.  3. The aortic valve is tricuspid. Moderate aortic annular calcification  noted.  4. The mitral valve is grossly normal. There is mild mitral annular  calcification present.  5. The tricuspid valve is grossly normal.  6. The aorta is normal in size and structure.   Recent Labs: 04/18/2020: ALT 16; B Natriuretic Peptide 37.2 04/21/2020: BUN 20; Creatinine, Ser 0.90; Hemoglobin 12.6; Platelets 348; Potassium 4.7; Sodium 138   Recent Lipid Panel    Component Value Date/Time   CHOL 173 01/23/2013 1105   TRIG 271 (H) 01/23/2013 1105   HDL 51 01/23/2013 1105   CHOLHDL 3.4 01/23/2013 1105   VLDL 54 (H) 01/23/2013 1105   LDLCALC 68 01/23/2013 1105    Physical Exam:   VS:  BP (!) 149/89   Pulse 87   Ht 5\' 2"  (1.575 m)   Wt (!) 319 lb 12.8 oz (145.1 kg)   SpO2 92%   BMI 58.49 kg/m    Wt Readings from Last 3 Encounters:  05/27/20 (!) 319 lb 12.8 oz (145.1 kg)  05/26/20 (!) 319 lb 8 oz (144.9 kg)  04/22/20 (!) 310 lb 6 oz (140.8 kg)    General: Well nourished, well developed, in no acute distress Head: Atraumatic, normal size  Eyes: PEERLA, EOMI  Neck: Supple, JVD 10-12 cm H20 Endocrine: No thryomegaly Cardiac: Normal S1, S2; RRR; no murmurs, rubs, or gallops Lungs: Diminished breath sounds bilaterally, crackles at the bases Abd: Soft, nontender, no hepatomegaly  Ext: 1+ pitting edema Musculoskeletal: No deformities, BUE and BLE strength normal and equal Skin: Warm and dry, no rashes   Neuro: Alert and oriented to person, place, time, and situation, CNII-XII grossly intact, no focal deficits  Psych: Normal mood and affect   ASSESSMENT:   Jessica [name] is  a 70 y.o. female who presents for the following: 1. Shortness of breath   2. Chronic diastolic heart failure ([place])   3. RVF (right ventricular failure) (H[facility])   4. Obesity,  morbid, BMI 50 or higher ([facility])   5. Chest pain of uncertain etiology     PLAN:   1. Shortness of breath 2. Chronic diastolic heart failure ([place]) 3. RVF (right ventricular failure) ([facility]) 4. Obesity, morbid, BMI 50 or higher ([facility]) -Weight is up to 320 pounds.  She had a 10 pound weight gain in the last 1 month.  She is short of breath with significant volume overload on examination.  We discussed hospital admission for IV diuresis versus outpatient therapy.  She is opted for outpatient.  We will increase her torsemide to 40 mg in the morning and then 20 mg in the afternoon for 5 days.  She will then resume 40 mg as a standing dose daily.  She will continue with potassium supplementation.  She reports she would like to avoid the hospital if possible.  Continues to work on her salt intake and reducing this as well.  This has been an issue for her over the last 2 years.  We will see how she does and I will see her back in 2 weeks.  5. Chest pain of uncertain etiology -Burning after eating.  No further episodes.  ER evaluation with troponins negative x2 and EKG unchanged from prior.  Noncardiac and likely GERD related.  She will take famotidine as needed.  Disposition: Return in about 2 weeks (around 06/10/2020).  Medication Adjustments/Labs and Tests Ordered: Current medicines are reviewed at length with the patient today.  Concerns regarding medicines are outlined above.  No orders of the defined types were placed in this encounter.  Meds ordered this encounter  Medications  . torsemide (DEMADEX) 20 MG tablet    Sig: Take 40 mg (2 tablets) by mouth daily.    Dispense:  180 tablet    Refill:  3    Patient Instructions  Medication Instructions:  Take Torsemide 40 mg daily  For 5 days take 40 AM and 20 PM then continue 40 mg daily   *If you need a refill on your cardiac medications before your next appointment, please call your pharmacy*  Follow-Up: At [facility], you and your health  needs are our priority.  As part of our continuing mission to provide you with exceptional heart care, we have created designated Provider Care Teams.  These Care Teams include your primary Cardiologist (physician) and Advanced Practice Providers (APPs -  Physician Assistants and Nurse Practitioners) who all work together to provide you with the care you need, when you need it.  We recommend signing up for the patient portal called "MyChart".  Sign up information is provided on this After Visit Summary.  MyChart is used to connect with patients for Virtual Visits (Telemedicine).  Patients are able to view lab/test results, encounter notes, upcoming appointments, etc.  Non-urgent messages can be sent to your provider as well.   To learn more about what you can do with MyChart, go to [email or web address].    Your next appointment:   2 week(s)  The format for your next appointment:   In Person  Provider:   Eleonore Chiquito, MD         Addison Naegeli. Audie Box, [place]  [address] [place], [place] [address] [phone]  05/27/2020 5:14 PM

## 2020-05-26 ENCOUNTER — Encounter: Payer: Self-pay | Admitting: Pulmonary Disease

## 2020-05-26 ENCOUNTER — Other Ambulatory Visit: Payer: Self-pay

## 2020-05-26 ENCOUNTER — Ambulatory Visit (INDEPENDENT_AMBULATORY_CARE_PROVIDER_SITE_OTHER): Payer: Medicare Other | Admitting: Pulmonary Disease

## 2020-05-26 VITALS — BP 130/78 | HR 92 | Temp 97.9°F | Ht 62.0 in | Wt 319.5 lb

## 2020-05-26 DIAGNOSIS — J9611 Chronic respiratory failure with hypoxia: Secondary | ICD-10-CM

## 2020-05-26 DIAGNOSIS — I5032 Chronic diastolic (congestive) heart failure: Secondary | ICD-10-CM

## 2020-05-26 DIAGNOSIS — G4733 Obstructive sleep apnea (adult) (pediatric): Secondary | ICD-10-CM | POA: Diagnosis not present

## 2020-05-26 NOTE — Patient Instructions (Signed)
Chronic hypoxemic respiratory failure  CONTINUE supplemental oxygen with exertion for >88% START atrovent inhaler TWO puffs every four hours as needed. Use with SPACER  Chronic diastolic heart failure CONTINUE torsemide 20 mg daily per Cardiology.   Obstructive sleep apnea Continue CPAP per Sleep  Follow-up with me in 3 months

## 2020-05-26 NOTE — Progress Notes (Signed)
Subjective:   PATIENT ID: Jessica [name] GENDER: female DOB: 1950-06-15, MRN: [id number]   HPI  Chief Complaint  Patient presents with  . Follow-up    She stated that she is doing better since last OV.  Has been more sedentary since her last OV.  She does get [facility] with exertion.  She is having severe pain in her chest when she leans forward or bends to pick something up   Reason for Visit: Follow-up   Jessica [name] is a 70 year old female never smoker with chronic hypoxemic respiratory failure, OSA on CPAP, spinal stenosis and lupus who presents for follow-up.  She has not been feeling well overall and is aware that she is less active. Sometimes laying on a couch 12-14 hours and even when she does move, it is to mainly sit up. Walking to the bathroom is extremely difficult and she gets more winded which further limits her movements. She reports with exertion her oxygen will drop to 86% but will recover within five minutes. Denies wheezing and cough. Compliant with her home CPAP.   Social History: Never smoker  I have personally reviewed patient's past medical/family/social history/allergies/current medications.  Past Medical History:  Diagnosis Date  . Arthritis    lower back, hands, knees  . Bipolar disorder ([place])   . Cellulitis and abscess of left leg 11/29/2018  . CHF (congestive heart failure) ([place])   . Depression   . Dyspnea    with exertion  . GERD (gastroesophageal reflux disease)    patient unsure about this dx - no meds  . Headache   . History of IBS    watches diet  . Hyperlipidemia   . Hypertension   . Hypothyroidism   . Lung edema, acute, with congestive heart failure ([place])   . Obese   . Plantar fasciitis   . Seasonal allergies   . Sleep apnea 2017   uses CPAP   . Spinal stenosis of lumbar region   . SVD (spontaneous vaginal delivery)    x 2  . Systemic lupus ([place])   . Thyroid disease      Outpatient Medications Prior to Visit   Medication Sig Dispense Refill  . acetaminophen (TYLENOL) 650 MG CR tablet Take 650 mg by mouth daily.    . Acetylcysteine 600 MG CAPS Take 600 mg by mouth 2 (two) times daily. NAC    . albuterol (VENTOLIN HFA) 108 (90 Base) MCG/ACT inhaler Inhale 2 puffs into the lungs every 6 (six) hours as needed for wheezing or shortness of breath. 8 g 6  . Carboxymeth-Glycerin-Polysorb (REFRESH OPTIVE ADVANCED OP) Apply to eye.    . cycloSPORINE (RESTASIS) 0.05 % ophthalmic emulsion Place 1 drop into both eyes 2 (two) times daily.    . DULoxetine (CYMBALTA) 60 MG capsule TAKE 1 CAPSULE BY MOUTH EVERY DAY 90 capsule 0  . econazole nitrate 1 % cream Apply topically daily.    . famotidine (PEPCID) 20 MG tablet Take 1 tablet (20 mg total) by mouth daily. 30 tablet 0  . gabapentin (NEURONTIN) 300 MG capsule 1 IN AM AND 3 AT EVENING 360 capsule 1  . ipratropium (ATROVENT HFA) 17 MCG/ACT inhaler Inhale 2 puffs into the lungs every 4 (four) hours as needed for wheezing. 1 each 12  . levothyroxine (SYNTHROID) 25 MCG tablet Take 25 mcg by mouth daily before breakfast.     . lithium carbonate 150 MG capsule TAKE 5 CAPSULES (750 MG TOTAL) BY MOUTH  AT BEDTIME. 450 capsule 0  . LORazepam (ATIVAN) 0.5 MG tablet Take 1 tablet (0.5 mg total) by mouth every 8 (eight) hours as needed for anxiety. 10 tablet 0  . metoprolol tartrate (LOPRESSOR) 25 MG tablet Take 0.5 tablets (12.5 mg total) by mouth 2 (two) times daily. 60 tablet 0  . Multiple Vitamins-Minerals (MULTIVITAMIN WITH MINERALS) tablet Take 1 tablet by mouth daily.    . Multiple Vitamins-Minerals (PRESERVISION AREDS 2) CAPS Take 1 capsule 2 (two) times daily by mouth.    . neomycin-polymyxin b-dexamethasone (MAXITROL) 3.5-10000-0.1 SUSP Place into the left eye.    . potassium chloride SA (KLOR-CON) 20 MEQ tablet Take 1 tablet by mouth daily with Torsemide 90 tablet 1  . pravastatin (PRAVACHOL) 20 MG tablet Take 20 mg at bedtime by mouth.     . SYNTHROID 200 MCG  tablet TAKE 1 TABLET BY MOUTH EVERY MORNING ON AN EMPTY STOMACH    . Thiamine HCl (VITAMIN B1) 100 MG TABS Take 1 tablet by mouth daily.    Marland Kitchen torsemide (DEMADEX) 20 MG tablet Take 1 tab (20 mg) in the morning and may take an extra  1 tab (20 mg) in the afternoon if needed. 180 tablet 3  . vitamin B-12 (CYANOCOBALAMIN) 100 MCG tablet Take 100 mcg by mouth 2 (two) times daily.    . Vitamin D, Ergocalciferol, (DRISDOL) 1.25 MG (50000 UNIT) CAPS capsule TAKE 1 CAPSULE (50,000 UNITS TOTAL) BY MOUTH EVERY 7 (SEVEN) DAYS. 12 capsule 2   No facility-administered medications prior to visit.    Review of Systems  Constitutional: Positive for malaise/fatigue. Negative for chills, diaphoresis, fever and weight loss.  HENT: Negative for congestion.   Respiratory: Positive for shortness of breath. Negative for cough, hemoptysis, sputum production and wheezing.   Cardiovascular: Negative for chest pain, palpitations and leg swelling.    Objective:   Vitals:   05/26/20 1452  BP: 130/78  Pulse: 92  Temp: 97.9 F (36.6 C)  TempSrc: Tympanic  SpO2: 90%  Weight: (!) 319 lb 8 oz (144.9 kg)  Height: 5\' 2"  (1.575 m)   Body mass index is 58.44 kg/m.   Physical Exam: General: Well-appearing, no acute distress HENT: [place], AT Eyes: EOMI, no scleral icterus Respiratory: Clear to auscultation bilaterally.  No crackles, wheezing or rales Cardiovascular: RRR, -M/R/G, no JVD Extremities:-Edema,-tenderness Neuro: AAO x4, CNII-XII grossly intact Skin: Intact, no rashes or bruising Psych: Normal mood, normal affect   Data Reviewed:  Imaging: CXR 11/16/18 - No infiltrate, edema or effusion. Enlarged cardiac silhouette CXR 12/09/18 - Cardiomegaly, bibasilar atelectasis CTA 02/29/20 - No pulmonary embolism. LLL lung nodule 6x38mm.   PFT: 01/29/19 FVC 0.61 (21%) FEV1 0.61 (28%) Ratio 100 Interpretation: No obstructive defect. Reduced FVC and FEV1 suggestive of restrictive defect  Sleep Study: 02/2016 -  AHI 133  Ambulatory O2 11/22/18 Patient Saturations on Room Air at Rest = 88% Patient Saturations on Room Air while Ambulating = 88% Patient Saturations on 4 Liters of oxygen while Ambulating = 91%  Recommend 2L of O2 at rest, 4L of O2 with exertion  Ambulatory O2 04/09/19  Patient Saturations on Room Air at Rest = 92% Patient Saturations on Room Air while Ambulating = 87% Patient Saturations on 2Liters of oxygen while Ambulating = 95%  Recommend 0L of O2 via nasal cannula at rest  Recommend 2L of O2 via nasal cannula with exertion and sleep  TTE  12/02/18 - EF 60-65%. Diastolic heart failure, mildly decreased RV systolic function. Aortic and  mitral annular calcification.  Imaging, labs and test noted above have been reviewed independently by me.    Assessment & Plan:   Discussion: 70 year old female with morbid obesity, OSA, chronic diastolic heart failure who presents for chronic hypoxemic respiratory failure. Likely undiagnosed PH contributing to her hypoxemia. Deconditioning remains a significant factor in her dyspnea as she clinically describes a very sedentary lifestyle. We discussed increasing activity and how immobility can result in worsening of symptoms. Encouraged her to challenge herself daily to mobilize within the house.   Chronic hypoxemic respiratory failure  CONTINUE supplemental oxygen with exertion for >88% START atrovent inhaler TWO puffs every four hours as needed. Use with SPACER  Chronic diastolic heart failure CONTINUE torsemide 20 mg daily per Cardiology.   Obstructive sleep apnea Continue CPAP per Sleep  Chronic pain Followed by Rheumatology Unable to refer to Pain Clinic due to prior interaction with the referral practice. I updated patient on this.  Morbid Obesity  No orders of the defined types were placed in this encounter.  No orders of the defined types were placed in this encounter.  Return in about 3 months (around 08/24/2020).   I have  spent a total time of 31-minutes on the day of the appointment reviewing prior documentation, coordinating care and discussing medical diagnosis and plan with the patient/family. Imaging, labs and tests included in this note have been reviewed and interpreted independently by me.  Burr, MD [facility] 05/26/2020 2:40 PM  Office Number [phone]

## 2020-05-27 ENCOUNTER — Encounter: Payer: Self-pay | Admitting: Cardiovascular Disease

## 2020-05-27 ENCOUNTER — Ambulatory Visit (INDEPENDENT_AMBULATORY_CARE_PROVIDER_SITE_OTHER): Payer: Medicare Other | Admitting: Cardiovascular Disease

## 2020-05-27 VITALS — BP 149/89 | HR 87 | Ht 62.0 in | Wt 319.8 lb

## 2020-05-27 DIAGNOSIS — I5032 Chronic diastolic (congestive) heart failure: Secondary | ICD-10-CM | POA: Diagnosis not present

## 2020-05-27 DIAGNOSIS — I50810 Right heart failure, unspecified: Secondary | ICD-10-CM | POA: Diagnosis not present

## 2020-05-27 DIAGNOSIS — R079 Chest pain, unspecified: Secondary | ICD-10-CM

## 2020-05-27 DIAGNOSIS — R0602 Shortness of breath: Secondary | ICD-10-CM

## 2020-05-27 MED ORDER — TORSEMIDE 20 MG PO TABS: ORAL_TABLET | ORAL | 3 refills | Status: DC

## 2020-05-27 NOTE — Patient Instructions (Signed)
Medication Instructions:  Take Torsemide 40 mg daily  For 5 days take 40 AM and 20 PM then continue 40 mg daily   *If you need a refill on your cardiac medications before your next appointment, please call your pharmacy*  Follow-Up: At [facility], you and your health needs are our priority.  As part of our continuing mission to provide you with exceptional heart care, we have created designated Provider Care Teams.  These Care Teams include your primary Cardiologist (physician) and Advanced Practice Providers (APPs -  Physician Assistants and Nurse Practitioners) who all work together to provide you with the care you need, when you need it.  We recommend signing up for the patient portal called "MyChart".  Sign up information is provided on this After Visit Summary.  MyChart is used to connect with patients for Virtual Visits (Telemedicine).  Patients are able to view lab/test results, encounter notes, upcoming appointments, etc.  Non-urgent messages can be sent to your provider as well.   To learn more about what you can do with MyChart, go to [email or web address].    Your next appointment:   2 week(s)  The format for your next appointment:   In Person  Provider:   Eleonore Chiquito, MD

## 2020-05-28 ENCOUNTER — Encounter: Payer: Self-pay | Admitting: Psychiatry

## 2020-05-28 ENCOUNTER — Other Ambulatory Visit: Payer: Self-pay

## 2020-05-28 ENCOUNTER — Ambulatory Visit (INDEPENDENT_AMBULATORY_CARE_PROVIDER_SITE_OTHER): Payer: Medicare Other | Admitting: Psychiatry

## 2020-05-28 DIAGNOSIS — Z79899 Other long term (current) drug therapy: Secondary | ICD-10-CM | POA: Diagnosis not present

## 2020-05-28 DIAGNOSIS — F900 Attention-deficit hyperactivity disorder, predominantly inattentive type: Secondary | ICD-10-CM | POA: Diagnosis not present

## 2020-05-28 DIAGNOSIS — F411 Generalized anxiety disorder: Secondary | ICD-10-CM

## 2020-05-28 DIAGNOSIS — F3162 Bipolar disorder, current episode mixed, moderate: Secondary | ICD-10-CM | POA: Diagnosis not present

## 2020-05-28 DIAGNOSIS — G4733 Obstructive sleep apnea (adult) (pediatric): Secondary | ICD-10-CM

## 2020-05-28 DIAGNOSIS — R7989 Other specified abnormal findings of blood chemistry: Secondary | ICD-10-CM

## 2020-05-28 MED ORDER — LURASIDONE HCL 40 MG PO TABS: 40.0000 mg | ORAL_TABLET | Freq: Every day | ORAL | 1 refills | Status: DC
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
## 2020-05-29 DIAGNOSIS — K219 Gastro-esophageal reflux disease without esophagitis: Secondary | ICD-10-CM | POA: Diagnosis not present

## 2020-05-29 DIAGNOSIS — E039 Hypothyroidism, unspecified: Secondary | ICD-10-CM | POA: Diagnosis not present

## 2020-05-29 DIAGNOSIS — Z6841 Body Mass Index (BMI) 40.0 and over, adult: Secondary | ICD-10-CM | POA: Diagnosis not present

## 2020-05-29 DIAGNOSIS — I509 Heart failure, unspecified: Secondary | ICD-10-CM | POA: Diagnosis not present

## 2020-05-29 DIAGNOSIS — M545 Low back pain, unspecified: Secondary | ICD-10-CM | POA: Diagnosis not present

## 2020-05-29 DIAGNOSIS — I110 Hypertensive heart disease with heart failure: Secondary | ICD-10-CM | POA: Diagnosis not present

## 2020-05-29 DIAGNOSIS — F3175 Bipolar disorder, in partial remission, most recent episode depressed: Secondary | ICD-10-CM | POA: Diagnosis not present

## 2020-05-29 DIAGNOSIS — G629 Polyneuropathy, unspecified: Secondary | ICD-10-CM | POA: Diagnosis not present

## 2020-05-29 DIAGNOSIS — Z741 Need for assistance with personal care: Secondary | ICD-10-CM | POA: Diagnosis not present

## 2020-05-29 DIAGNOSIS — L83 Acanthosis nigricans: Secondary | ICD-10-CM | POA: Diagnosis not present

## 2020-05-29 DIAGNOSIS — Z9181 History of falling: Secondary | ICD-10-CM | POA: Diagnosis not present

## 2020-05-29 DIAGNOSIS — G4733 Obstructive sleep apnea (adult) (pediatric): Secondary | ICD-10-CM | POA: Diagnosis not present

## 2020-05-29 DIAGNOSIS — L309 Dermatitis, unspecified: Secondary | ICD-10-CM | POA: Diagnosis not present

## 2020-05-29 DIAGNOSIS — N3281 Overactive bladder: Secondary | ICD-10-CM | POA: Diagnosis not present

## 2020-05-29 DIAGNOSIS — M79671 Pain in right foot: Secondary | ICD-10-CM | POA: Diagnosis not present

## 2020-05-29 DIAGNOSIS — M6281 Muscle weakness (generalized): Secondary | ICD-10-CM | POA: Diagnosis not present

## 2020-05-29 DIAGNOSIS — M5432 Sciatica, left side: Secondary | ICD-10-CM | POA: Diagnosis not present

## 2020-05-29 DIAGNOSIS — J45909 Unspecified asthma, uncomplicated: Secondary | ICD-10-CM | POA: Diagnosis not present

## 2020-05-29 DIAGNOSIS — J309 Allergic rhinitis, unspecified: Secondary | ICD-10-CM | POA: Diagnosis not present

## 2020-06-02 DIAGNOSIS — N3281 Overactive bladder: Secondary | ICD-10-CM | POA: Diagnosis not present

## 2020-06-02 DIAGNOSIS — E039 Hypothyroidism, unspecified: Secondary | ICD-10-CM | POA: Diagnosis not present

## 2020-06-02 DIAGNOSIS — M545 Low back pain, unspecified: Secondary | ICD-10-CM | POA: Diagnosis not present

## 2020-06-02 DIAGNOSIS — I509 Heart failure, unspecified: Secondary | ICD-10-CM | POA: Diagnosis not present

## 2020-06-02 DIAGNOSIS — I110 Hypertensive heart disease with heart failure: Secondary | ICD-10-CM | POA: Diagnosis not present

## 2020-06-02 DIAGNOSIS — G629 Polyneuropathy, unspecified: Secondary | ICD-10-CM | POA: Diagnosis not present

## 2020-06-04 ENCOUNTER — Telehealth: Payer: Self-pay | Admitting: Cardiovascular Disease

## 2020-06-04 DIAGNOSIS — I110 Hypertensive heart disease with heart failure: Secondary | ICD-10-CM | POA: Diagnosis not present

## 2020-06-04 DIAGNOSIS — E039 Hypothyroidism, unspecified: Secondary | ICD-10-CM | POA: Diagnosis not present

## 2020-06-04 DIAGNOSIS — G629 Polyneuropathy, unspecified: Secondary | ICD-10-CM | POA: Diagnosis not present

## 2020-06-04 DIAGNOSIS — N3281 Overactive bladder: Secondary | ICD-10-CM | POA: Diagnosis not present

## 2020-06-04 DIAGNOSIS — M545 Low back pain, unspecified: Secondary | ICD-10-CM | POA: Diagnosis not present

## 2020-06-04 DIAGNOSIS — I509 Heart failure, unspecified: Secondary | ICD-10-CM | POA: Diagnosis not present

## 2020-06-04 NOTE — Telephone Encounter (Signed)
Advised patient, verbalized understanding  

## 2020-06-04 NOTE — Telephone Encounter (Signed)
OK to continue at higher dose.   [place] T. Audie Box, MD, [place]  [address], [place], [place] [address] [phone]  3:48 PM

## 2020-06-04 NOTE — Telephone Encounter (Signed)
Patient to call back with weight.

## 2020-06-04 NOTE — Telephone Encounter (Signed)
   Pt c/o medication issue:  1. Name of Medication: fluid pill  2. How are you currently taking this medication (dosage and times per day)?   3. Are you having a reaction (difficulty breathing--STAT)?   4. What is your medication issue? Pt said Dr. Audie Box instructed her to take 60 mg a day of her fluid pill for 5 days. She said her weight did go down 5lbs and she went back taking 40 mg a day of her fluid pill. She wanted to ask Dr. Audie Box if she can take the 60 mg a day again so she can lose more of her fluid/weight

## 2020-06-04 NOTE — Telephone Encounter (Signed)
Patient states that she weighed 314 a couple of days ago and then today she weighed 316. She does state that she had on more clothing and just finished eating before she weighed herself today so this may be a reflection of the weight gain. Also, she states that she has trouble standing and has to use a walker to get around so she is unsure if this can cause any descrepancies  in the weight.

## 2020-06-04 NOTE — Telephone Encounter (Signed)
Spoke with patient, she increased her Torsemide as recommended Weight down to 314 Monday and now 316  She would like to continue Torsemide at 40 mg in am 20 mg in afternoon Still has leg swelling but is unsure of any changes Will forward to Dr Audie Box for review

## 2020-06-05 ENCOUNTER — Telehealth: Payer: Self-pay | Admitting: Cardiovascular Disease

## 2020-06-05 NOTE — Telephone Encounter (Signed)
Jessica [name] is calling requesting a call from [place] stating she has a few questions for her.

## 2020-06-05 NOTE — Telephone Encounter (Signed)
Spoke with pt, questions regarding torsemide dose answered.

## 2020-06-06 DIAGNOSIS — I509 Heart failure, unspecified: Secondary | ICD-10-CM | POA: Diagnosis not present

## 2020-06-06 DIAGNOSIS — I110 Hypertensive heart disease with heart failure: Secondary | ICD-10-CM | POA: Diagnosis not present

## 2020-06-06 DIAGNOSIS — M545 Low back pain, unspecified: Secondary | ICD-10-CM | POA: Diagnosis not present

## 2020-06-06 DIAGNOSIS — G629 Polyneuropathy, unspecified: Secondary | ICD-10-CM | POA: Diagnosis not present

## 2020-06-06 DIAGNOSIS — E039 Hypothyroidism, unspecified: Secondary | ICD-10-CM | POA: Diagnosis not present

## 2020-06-06 DIAGNOSIS — N3281 Overactive bladder: Secondary | ICD-10-CM | POA: Diagnosis not present

## 2020-06-08 NOTE — Progress Notes (Signed)
Cardiology Office Note:   Date:  06/10/2020  NAME:  Jessica [name]    MRN: [id number] DOB:  1950-09-11   PCP:  Alroy Dust, L.Marlou Sa, MD  Cardiologist:  Evalina Field, MD   Referring MD: Aurea Graff.Marlou Sa, MD   Chief Complaint  Patient presents with  . Congestive Heart Failure   History of Present Illness:   Jessica [name] is a 70 y.o. female with a hx of HFpEF, morbid obesity (BMI 53), HTN who presents for follow-up. Seen 2 weeks ago and had increased SOB and LE edema. Torsemide increased.  Her weight is come down 3 pounds from previous 1916 pounds.  She is taking 40 mg torsemide in the morning and 20 mg in the afternoon.  She is to be doing better.  Still short of breath with exertion.  She reports she would like to avoid the hospital.  Oxygen saturations 93%.  Her lower extremity edema her legs have improved.  Still has abdominal distention.  Lungs are diminished but sound clear.  JVD difficult to assess.  Overall appears to be doing better.  I recommend she continue on the current dose and recheck her kidney panel today.  Need to make sure the potassium level acceptable.  Problem List: 1. HFpEF -60-65%, G1DD 2. RV Failure 2/2 OSA/OHS 3. Morbid Obesity -BMI 53 4. HTN  Past Medical History: Past Medical History:  Diagnosis Date  . Arthritis    lower back, hands, knees  . Bipolar disorder ([place])   . Cellulitis and abscess of left leg 11/29/2018  . CHF (congestive heart failure) ([place])   . Depression   . Dyspnea    with exertion  . GERD (gastroesophageal reflux disease)    patient unsure about this dx - no meds  . Headache   . History of IBS    watches diet  . Hyperlipidemia   . Hypertension   . Hypothyroidism   . Lung edema, acute, with congestive heart failure ([place])   . Obese   . Plantar fasciitis   . Seasonal allergies   . Sleep apnea 2017   uses CPAP   . Spinal stenosis of lumbar region   . SVD (spontaneous vaginal delivery)    x 2  . Systemic lupus  ([place])   . Thyroid disease     Past Surgical History: Past Surgical History:  Procedure Laterality Date  . CARPAL TUNNEL RELEASE Left 09/16/2017   Procedure: LEFT CARPAL TUNNEL RELEASE;  Surgeon: Daryll Brod, MD;  Location: [facility];  Service: Orthopedics;  Laterality: Left;  . carpel tunnel surgery Right   . CATARACT EXTRACTION Bilateral 2007   w/ lens implants  . COLONOSCOPY    . DILATION AND CURETTAGE OF UTERUS    . EYE SURGERY    . FOOT SURGERY Right    hammer toe  . FRACTURE SURGERY     R tibia and fibula  . LEG SURGERY Right   . TONSILLECTOMY    . WISDOM TOOTH EXTRACTION      Current Medications: Current Meds  Medication Sig  . acetaminophen (TYLENOL) 650 MG CR tablet Take 650 mg by mouth daily.  . Acetylcysteine 600 MG CAPS Take 600 mg by mouth 2 (two) times daily. NAC  . albuterol (VENTOLIN HFA) 108 (90 Base) MCG/ACT inhaler Inhale 2 puffs into the lungs every 6 (six) hours as needed for wheezing or shortness of breath.  . Carboxymeth-Glycerin-Polysorb (REFRESH OPTIVE ADVANCED OP) Apply to eye.  . cycloSPORINE (RESTASIS) 0.05 % ophthalmic  emulsion Place 1 drop into both eyes 2 (two) times daily.  . DULoxetine (CYMBALTA) 60 MG capsule TAKE 1 CAPSULE BY MOUTH EVERY DAY  . econazole nitrate 1 % cream Apply topically daily.  Marland Kitchen gabapentin (NEURONTIN) 300 MG capsule 1 IN AM AND 3 AT EVENING  . ipratropium (ATROVENT HFA) 17 MCG/ACT inhaler Inhale 2 puffs into the lungs every 4 (four) hours as needed for wheezing.  Marland Kitchen levothyroxine (SYNTHROID) 25 MCG tablet Take 25 mcg by mouth daily before breakfast.   . lithium carbonate 150 MG capsule TAKE 5 CAPSULES (750 MG TOTAL) BY MOUTH AT BEDTIME.  . LORazepam (ATIVAN) 0.5 MG tablet Take 1 tablet (0.5 mg total) by mouth every 8 (eight) hours as needed for anxiety.  Marland Kitchen lurasidone (LATUDA) 40 MG TABS tablet Take 1 tablet (40 mg total) by mouth daily.  . metoprolol tartrate (LOPRESSOR) 25 MG tablet Take 0.5 tablets (12.5 mg total) by mouth 2  (two) times daily.  . Multiple Vitamins-Minerals (MULTIVITAMIN WITH MINERALS) tablet Take 1 tablet by mouth daily.  . Multiple Vitamins-Minerals (PRESERVISION AREDS 2) CAPS Take 1 capsule 2 (two) times daily by mouth.  . neomycin-polymyxin b-dexamethasone (MAXITROL) 3.5-10000-0.1 SUSP Place into the left eye.  . pravastatin (PRAVACHOL) 20 MG tablet Take 20 mg at bedtime by mouth.   . SYNTHROID 200 MCG tablet TAKE 1 TABLET BY MOUTH EVERY MORNING ON AN EMPTY STOMACH  . Thiamine HCl (VITAMIN B1) 100 MG TABS Take 1 tablet by mouth daily.  . vitamin B-12 (CYANOCOBALAMIN) 100 MCG tablet Take 100 mcg by mouth 2 (two) times daily.  . Vitamin D, Ergocalciferol, (DRISDOL) 1.25 MG (50000 UNIT) CAPS capsule TAKE 1 CAPSULE (50,000 UNITS TOTAL) BY MOUTH EVERY 7 (SEVEN) DAYS.  . [DISCONTINUED] potassium chloride SA (KLOR-CON) 20 MEQ tablet Take 1 tablet by mouth daily with Torsemide  . [DISCONTINUED] torsemide (DEMADEX) 20 MG tablet Take 40 mg by mouth as directed. Take 40 mg in am and 20 mg in pm     Allergies:    Ibuprofen, Codeine, Nabumetone, Other, Promethazine, Amoxicillin-pot clavulanate, Erythromycin base, and Promethazine-codeine   Social History: Social History   Socioeconomic History  . Marital status: Married    Spouse name: Not on file  . Number of children: Not on file  . Years of education: Not on file  . Highest education level: Not on file  Occupational History  . Not on file  Tobacco Use  . Smoking status: Never Smoker  . Smokeless tobacco: Never Used  Vaping Use  . Vaping Use: Never used  Substance and Sexual Activity  . Alcohol use: Not Currently  . Drug use: Never  . Sexual activity: Not on file  Other Topics Concern  . Not on file  Social History Narrative   She lives in a single level home with her husband.  She has two grown children.   She taught college accounting courses, retired in 2005.    Highest level of education:  Designer, jewellery in [employer]   Social  Determinants of Health   Financial Resource Strain: Not on file  Food Insecurity: Not on file  Transportation Needs: Not on file  Physical Activity: Not on file  Stress: Not on file  Social Connections: Not on file     Family History: The patient's family history includes Bipolar disorder in her father; Breast cancer in her mother; Dementia in her father; Diabetes in her father; Heart disease in her father and mother; Thyroid disease in her mother.  ROS:  All other ROS reviewed and negative. Pertinent positives noted in the HPI.     EKGs/Labs/Other Studies Reviewed:   The following studies were personally reviewed by me today:  TTE 12/02/2018 1. The left ventricle has normal systolic function with an ejection  fraction of 60-65%. The cavity size was normal. There is mild concentric  left ventricular hypertrophy. Left ventricular diastolic Doppler  parameters are consistent with impaired  relaxation. Indeterminate filling pressures.  2. The right ventricle has mildly reduced systolic function. The cavity  was mildly enlarged. There is no increase in right ventricular wall  thickness.  3. The aortic valve is tricuspid. Moderate aortic annular calcification  noted.  4. The mitral valve is grossly normal. There is mild mitral annular  calcification present.  5. The tricuspid valve is grossly normal.  6. The aorta is normal in size and structure.   Recent Labs: 04/18/2020: ALT 16; B Natriuretic Peptide 37.2 04/21/2020: BUN 20; Creatinine, Ser 0.90; Hemoglobin 12.6; Platelets 348; Potassium 4.7; Sodium 138   Recent Lipid Panel    Component Value Date/Time   CHOL 173 01/23/2013 1105   TRIG 271 (H) 01/23/2013 1105   HDL 51 01/23/2013 1105   CHOLHDL 3.4 01/23/2013 1105   VLDL 54 (H) 01/23/2013 1105   LDLCALC 68 01/23/2013 1105    Physical Exam:   VS:  BP 120/73   Pulse 89   Wt (!) 316 lb (143.3 kg)   SpO2 93%   BMI 57.80 kg/m    Wt Readings from Last 3 Encounters:   06/10/20 (!) 316 lb (143.3 kg)  05/27/20 (!) 319 lb 12.8 oz (145.1 kg)  05/26/20 (!) 319 lb 8 oz (144.9 kg)    General: Well nourished, well developed, in no acute distress Head: Atraumatic, normal size  Eyes: PEERLA, EOMI  Neck: Supple, no JVD Endocrine: No thryomegaly Cardiac: Normal S1, S2; RRR; no murmurs, rubs, or gallops Lungs: Clear to auscultation bilaterally, no wheezing, rhonchi or rales  Abd: Distended abdomen Ext: No edema, pulses 2+ Musculoskeletal: No deformities, BUE and BLE strength normal and equal Skin: Warm and dry, no rashes   Neuro: Alert and oriented to person, place, time, and situation, CNII-XII grossly intact, no focal deficits  Psych: Normal mood and affect   ASSESSMENT:   Jessica [name] is a 70 y.o. female who presents for the following: 1. Shortness of breath   2. Chronic diastolic heart failure ([place])   3. RVF (right ventricular failure) ([facility])   4. Obesity, morbid, BMI 50 or higher ([place])     PLAN:   1. Shortness of breath 2. Chronic diastolic heart failure ([place]) 3. RVF (right ventricular failure) ([facility]) 4. Obesity, morbid, BMI 50 or higher ([place]) -Weights have improved.  On 40 mg torsemide in the morning and 20 mg torsemide in the evening.  On potassium supplementation.  I would recommend we continue this dose.  We will check to make sure potassium is okay.  I will plan to see her back in 3 months.  She will let us know if things change.  I advised against strict salt reduction.  She will work on this as well.  Disposition: Return in about 3 months (around 09/07/2020).  Medication Adjustments/Labs and Tests Ordered: Current medicines are reviewed at length with the patient today.  Concerns regarding medicines are outlined above.  Orders Placed This Encounter  Procedures  . Basic metabolic panel   Meds ordered this encounter  Medications  . torsemide (DEMADEX) 20 MG tablet  Sig: Take 40 mg in am and 20 mg in pm    Dispense:  180 tablet     Refill:  1  . potassium chloride SA (KLOR-CON) 20 MEQ tablet    Sig: Take 1 tablet by mouth daily with Torsemide    Dispense:  90 tablet    Refill:  1    Patient Instructions  Medication Instructions:  Increase Torsemide 40 mg in the morning 20 mg in the evening.  *If you need a refill on your cardiac medications before your next appointment, please call your pharmacy*   Lab Work: BMET today  If you have labs (blood work) drawn today and your tests are completely normal, you will receive your results only by: Marland Kitchen MyChart Message (if you have MyChart) OR . A paper copy in the mail If you have any lab test that is abnormal or we need to change your treatment, we will call you to review the results.   Follow-Up: At [facility], you and your health needs are our priority.  As part of our continuing mission to provide you with exceptional heart care, we have created designated Provider Care Teams.  These Care Teams include your primary Cardiologist (physician) and Advanced Practice Providers (APPs -  Physician Assistants and Nurse Practitioners) who all work together to provide you with the care you need, when you need it.  We recommend signing up for the patient portal called "MyChart".  Sign up information is provided on this After Visit Summary.  MyChart is used to connect with patients for Virtual Visits (Telemedicine).  Patients are able to view lab/test results, encounter notes, upcoming appointments, etc.  Non-urgent messages can be sent to your provider as well.   To learn more about what you can do with MyChart, go to [email or web address].    Your next appointment:   3 month(s)  The format for your next appointment:   In Person  Provider:   Eleonore Chiquito, MD         Time Spent with Patient: I have spent a total of 25 minutes with patient reviewing hospital notes, telemetry, EKGs, labs and examining the patient as well as establishing an assessment and plan that  was discussed with the patient.  > 50% of time was spent in direct patient care.  Signed, Addison Naegeli. Audie Box, [place]  [address], [place], [place] [address] [phone]  06/10/2020 3:57 PM

## 2020-06-09 DIAGNOSIS — G629 Polyneuropathy, unspecified: Secondary | ICD-10-CM | POA: Diagnosis not present

## 2020-06-09 DIAGNOSIS — N3281 Overactive bladder: Secondary | ICD-10-CM | POA: Diagnosis not present

## 2020-06-09 DIAGNOSIS — E039 Hypothyroidism, unspecified: Secondary | ICD-10-CM | POA: Diagnosis not present

## 2020-06-09 DIAGNOSIS — M545 Low back pain, unspecified: Secondary | ICD-10-CM | POA: Diagnosis not present

## 2020-06-09 DIAGNOSIS — I509 Heart failure, unspecified: Secondary | ICD-10-CM | POA: Diagnosis not present

## 2020-06-09 DIAGNOSIS — I110 Hypertensive heart disease with heart failure: Secondary | ICD-10-CM | POA: Diagnosis not present

## 2020-06-10 ENCOUNTER — Encounter: Payer: Self-pay | Admitting: Cardiovascular Disease

## 2020-06-10 ENCOUNTER — Other Ambulatory Visit: Payer: Self-pay

## 2020-06-10 ENCOUNTER — Ambulatory Visit (INDEPENDENT_AMBULATORY_CARE_PROVIDER_SITE_OTHER): Payer: Medicare Other | Admitting: Cardiovascular Disease

## 2020-06-10 VITALS — BP 120/73 | HR 89 | Wt 316.0 lb

## 2020-06-10 DIAGNOSIS — I5032 Chronic diastolic (congestive) heart failure: Secondary | ICD-10-CM | POA: Diagnosis not present

## 2020-06-10 DIAGNOSIS — I50810 Right heart failure, unspecified: Secondary | ICD-10-CM | POA: Diagnosis not present

## 2020-06-10 DIAGNOSIS — R0602 Shortness of breath: Secondary | ICD-10-CM

## 2020-06-10 MED ORDER — TORSEMIDE 20 MG PO TABS: ORAL_TABLET | ORAL | 1 refills | Status: DC

## 2020-06-10 MED ORDER — POTASSIUM CHLORIDE CRYS ER 20 MEQ PO TBCR: EXTENDED_RELEASE_TABLET | ORAL | 1 refills | Status: AC

## 2020-06-10 NOTE — Patient Instructions (Signed)
Medication Instructions:  Increase Torsemide 40 mg in the morning 20 mg in the evening.  *If you need a refill on your cardiac medications before your next appointment, please call your pharmacy*   Lab Work: BMET today  If you have labs (blood work) drawn today and your tests are completely normal, you will receive your results only by: Marland Kitchen MyChart Message (if you have MyChart) OR . A paper copy in the mail If you have any lab test that is abnormal or we need to change your treatment, we will call you to review the results.   Follow-Up: At [facility], you and your health needs are our priority.  As part of our continuing mission to provide you with exceptional heart care, we have created designated Provider Care Teams.  These Care Teams include your primary Cardiologist (physician) and Advanced Practice Providers (APPs -  Physician Assistants and Nurse Practitioners) who all work together to provide you with the care you need, when you need it.  We recommend signing up for the patient portal called "MyChart".  Sign up information is provided on this After Visit Summary.  MyChart is used to connect with patients for Virtual Visits (Telemedicine).  Patients are able to view lab/test results, encounter notes, upcoming appointments, etc.  Non-urgent messages can be sent to your provider as well.   To learn more about what you can do with MyChart, go to [email or web address].    Your next appointment:   3 month(s)  The format for your next appointment:   In Person  Provider:   Eleonore Chiquito, MD

## 2020-06-11 DIAGNOSIS — N3281 Overactive bladder: Secondary | ICD-10-CM | POA: Diagnosis not present

## 2020-06-11 DIAGNOSIS — E039 Hypothyroidism, unspecified: Secondary | ICD-10-CM | POA: Diagnosis not present

## 2020-06-11 DIAGNOSIS — I509 Heart failure, unspecified: Secondary | ICD-10-CM | POA: Diagnosis not present

## 2020-06-11 DIAGNOSIS — I110 Hypertensive heart disease with heart failure: Secondary | ICD-10-CM | POA: Diagnosis not present

## 2020-06-11 DIAGNOSIS — M545 Low back pain, unspecified: Secondary | ICD-10-CM | POA: Diagnosis not present

## 2020-06-11 DIAGNOSIS — G629 Polyneuropathy, unspecified: Secondary | ICD-10-CM | POA: Diagnosis not present

## 2020-06-11 LAB — BASIC METABOLIC PANEL
BUN/Creatinine Ratio: 24 (ref 12–28)
BUN: 25 mg/dL (ref 8–27)
CO2: 26 mmol/L (ref 20–29)
Calcium: 10.6 mg/dL — ABNORMAL HIGH (ref 8.7–10.3)
Chloride: 98 mmol/L (ref 96–106)
Creatinine, Ser: 1.05 mg/dL — ABNORMAL HIGH (ref 0.57–1.00)
GFR calc Af Amer: 62 mL/min/{1.73_m2} (ref >59)
GFR calc non Af Amer: 54 mL/min/{1.73_m2} — ABNORMAL LOW (ref >59)
Glucose: 104 mg/dL — ABNORMAL HIGH (ref 65–99)
Potassium: 4.8 mmol/L (ref 3.5–5.2)
Sodium: 138 mmol/L (ref 134–144)

## 2020-06-12 DIAGNOSIS — H04123 Dry eye syndrome of bilateral lacrimal glands: Secondary | ICD-10-CM | POA: Diagnosis not present

## 2020-06-12 DIAGNOSIS — H16223 Keratoconjunctivitis sicca, not specified as Sjogren's, bilateral: Secondary | ICD-10-CM | POA: Diagnosis not present

## 2020-06-18 ENCOUNTER — Ambulatory Visit: Payer: Medicare Other | Admitting: Psychiatry

## 2020-06-19 ENCOUNTER — Encounter: Payer: Self-pay | Admitting: Pulmonary Disease

## 2020-06-24 ENCOUNTER — Ambulatory Visit: Payer: Medicare Other | Admitting: Psychiatry

## 2020-06-25 ENCOUNTER — Ambulatory Visit: Payer: Medicare Other | Admitting: Psychiatry

## 2020-06-25 DIAGNOSIS — I110 Hypertensive heart disease with heart failure: Secondary | ICD-10-CM | POA: Diagnosis not present

## 2020-06-25 DIAGNOSIS — G629 Polyneuropathy, unspecified: Secondary | ICD-10-CM | POA: Diagnosis not present

## 2020-06-25 DIAGNOSIS — I509 Heart failure, unspecified: Secondary | ICD-10-CM | POA: Diagnosis not present

## 2020-06-25 DIAGNOSIS — N3281 Overactive bladder: Secondary | ICD-10-CM | POA: Diagnosis not present

## 2020-06-25 DIAGNOSIS — E039 Hypothyroidism, unspecified: Secondary | ICD-10-CM | POA: Diagnosis not present

## 2020-06-25 DIAGNOSIS — M545 Low back pain, unspecified: Secondary | ICD-10-CM | POA: Diagnosis not present

## 2020-06-26 ENCOUNTER — Telehealth: Payer: Self-pay | Admitting: Pulmonary Disease

## 2020-06-26 DIAGNOSIS — I110 Hypertensive heart disease with heart failure: Secondary | ICD-10-CM | POA: Diagnosis not present

## 2020-06-26 DIAGNOSIS — I509 Heart failure, unspecified: Secondary | ICD-10-CM | POA: Diagnosis not present

## 2020-06-26 DIAGNOSIS — M545 Low back pain, unspecified: Secondary | ICD-10-CM | POA: Diagnosis not present

## 2020-06-26 DIAGNOSIS — N3281 Overactive bladder: Secondary | ICD-10-CM | POA: Diagnosis not present

## 2020-06-26 DIAGNOSIS — G629 Polyneuropathy, unspecified: Secondary | ICD-10-CM | POA: Diagnosis not present

## 2020-06-26 DIAGNOSIS — E039 Hypothyroidism, unspecified: Secondary | ICD-10-CM | POA: Diagnosis not present

## 2020-06-26 NOTE — Telephone Encounter (Signed)
Called and spoke with pt and she wanted to give an update to [place] and to see if she feels that she needs to come in for a visit:  Pt stated that the last few weeks her breathing has been worse She has resumed using the supplemental oxygen 24/7 due to lower sats She has been using 2-3 liters since her levels have dropped down to 81% when she was changing the tank over.  She stated that with the oxygen her levels have been between 90%-92%  She did not want to schedule an appt to be seen with out JE stating that is what she needs to do.  JE please advise. Thanks

## 2020-06-26 NOTE — Telephone Encounter (Signed)
I have returned Ms. [place] phone call. She had unchanged shortness of breath with exertion. Denies fever, cough, sputum production. She has noticed hypoxemia requiring her to increase from 2L O2 to 3L with improvement. She has been inconsistent with her inhaler use. Is compliant with diuretics and CPAP. On telephone assessment, she seems to be at baseline with more fatigue with increased activity. Doubt this is new infection. She denies worsening edema and actually feels it is improved on her current regimen. I have encouraged her to use her atrovent as prescribed and to increase activity.  Assessment  Shortness of breath Chronic hypoxemic respiratory failure secondary to suspected PH related to OSA Deconditioning  Plan: --Atrovent q4h for shortness of breath --No antibiotics indicated --Advised to present to ED if hypoxemia worsens  --If symptoms persist, can contact office to schedule appointment with NP or me  Rodman Pickle, M.D. [facility] Pulmonary/Critical Care Medicine 06/26/2020 12:20 PM

## 2020-06-27 ENCOUNTER — Telehealth: Payer: Self-pay | Admitting: Psychiatry

## 2020-06-27 NOTE — Telephone Encounter (Signed)
Mal Amabile to report that she is experiencing illusions/hallications mostly when she is sleeping or in the waking mode. Example - last night about 1:30am she thought she heard her husband telling he get up that she needed to get up.  So she took off her oxygen hose for her cpap thinking he was coming to change it.  He never came so she called to him and he had not called to her and was not coming to do anything.  It was all imagined.  She is also sometimes smelling smoke in her cpap.  It is hard for her to discern if it is real or imagined.  Is there a medication that is causing her to have these illusions?  Also she wanted you to know that she spoke with her lung dr. Wilburn Mylar and was told that things are not getting better and probably won't and that she should start looking into palliative care.  These of course has really upset her and wanted you to be aware of this.

## 2020-06-27 NOTE — Telephone Encounter (Signed)
Illusions or hallucinations around the time of falling asleep or just waking up from sleep are not uncommon in normal people.  There is no evidence that any of her medication would make those worse or likely to make them better either.  Her lung problems and specifically low oxygen levels may tend to make it worse than usual.  Doing whatever she can to keep her oxygen levels up is helpful, but I'm sure she is doing that.

## 2020-06-28 DIAGNOSIS — M6281 Muscle weakness (generalized): Secondary | ICD-10-CM | POA: Diagnosis not present

## 2020-06-28 DIAGNOSIS — Z741 Need for assistance with personal care: Secondary | ICD-10-CM | POA: Diagnosis not present

## 2020-06-28 DIAGNOSIS — J309 Allergic rhinitis, unspecified: Secondary | ICD-10-CM | POA: Diagnosis not present

## 2020-06-28 DIAGNOSIS — N3281 Overactive bladder: Secondary | ICD-10-CM | POA: Diagnosis not present

## 2020-06-28 DIAGNOSIS — M5432 Sciatica, left side: Secondary | ICD-10-CM | POA: Diagnosis not present

## 2020-06-28 DIAGNOSIS — L309 Dermatitis, unspecified: Secondary | ICD-10-CM | POA: Diagnosis not present

## 2020-06-28 DIAGNOSIS — Z6841 Body Mass Index (BMI) 40.0 and over, adult: Secondary | ICD-10-CM | POA: Diagnosis not present

## 2020-06-28 DIAGNOSIS — I110 Hypertensive heart disease with heart failure: Secondary | ICD-10-CM | POA: Diagnosis not present

## 2020-06-28 DIAGNOSIS — M545 Low back pain, unspecified: Secondary | ICD-10-CM | POA: Diagnosis not present

## 2020-06-28 DIAGNOSIS — J45909 Unspecified asthma, uncomplicated: Secondary | ICD-10-CM | POA: Diagnosis not present

## 2020-06-28 DIAGNOSIS — F3175 Bipolar disorder, in partial remission, most recent episode depressed: Secondary | ICD-10-CM | POA: Diagnosis not present

## 2020-06-28 DIAGNOSIS — K219 Gastro-esophageal reflux disease without esophagitis: Secondary | ICD-10-CM | POA: Diagnosis not present

## 2020-06-28 DIAGNOSIS — E039 Hypothyroidism, unspecified: Secondary | ICD-10-CM | POA: Diagnosis not present

## 2020-06-28 DIAGNOSIS — I509 Heart failure, unspecified: Secondary | ICD-10-CM | POA: Diagnosis not present

## 2020-06-28 DIAGNOSIS — G4733 Obstructive sleep apnea (adult) (pediatric): Secondary | ICD-10-CM | POA: Diagnosis not present

## 2020-06-28 DIAGNOSIS — M79671 Pain in right foot: Secondary | ICD-10-CM | POA: Diagnosis not present

## 2020-06-28 DIAGNOSIS — Z9181 History of falling: Secondary | ICD-10-CM | POA: Diagnosis not present

## 2020-06-28 DIAGNOSIS — L83 Acanthosis nigricans: Secondary | ICD-10-CM | POA: Diagnosis not present

## 2020-06-28 DIAGNOSIS — G629 Polyneuropathy, unspecified: Secondary | ICD-10-CM | POA: Diagnosis not present

## 2020-06-30 NOTE — Telephone Encounter (Signed)
Spoke with patient Friday evening 06/27/2020 and discussed advisement from Dr. Clovis Pu. She was appreciative of phone call back.

## 2020-07-01 DIAGNOSIS — E039 Hypothyroidism, unspecified: Secondary | ICD-10-CM | POA: Diagnosis not present

## 2020-07-01 DIAGNOSIS — M545 Low back pain, unspecified: Secondary | ICD-10-CM | POA: Diagnosis not present

## 2020-07-01 DIAGNOSIS — N3281 Overactive bladder: Secondary | ICD-10-CM | POA: Diagnosis not present

## 2020-07-01 DIAGNOSIS — G629 Polyneuropathy, unspecified: Secondary | ICD-10-CM | POA: Diagnosis not present

## 2020-07-01 DIAGNOSIS — I509 Heart failure, unspecified: Secondary | ICD-10-CM | POA: Diagnosis not present

## 2020-07-01 DIAGNOSIS — I110 Hypertensive heart disease with heart failure: Secondary | ICD-10-CM | POA: Diagnosis not present

## 2020-07-02 DIAGNOSIS — M545 Low back pain, unspecified: Secondary | ICD-10-CM | POA: Diagnosis not present

## 2020-07-02 DIAGNOSIS — N3281 Overactive bladder: Secondary | ICD-10-CM | POA: Diagnosis not present

## 2020-07-02 DIAGNOSIS — G629 Polyneuropathy, unspecified: Secondary | ICD-10-CM | POA: Diagnosis not present

## 2020-07-02 DIAGNOSIS — I110 Hypertensive heart disease with heart failure: Secondary | ICD-10-CM | POA: Diagnosis not present

## 2020-07-02 DIAGNOSIS — I509 Heart failure, unspecified: Secondary | ICD-10-CM | POA: Diagnosis not present

## 2020-07-02 DIAGNOSIS — E039 Hypothyroidism, unspecified: Secondary | ICD-10-CM | POA: Diagnosis not present

## 2020-07-05 ENCOUNTER — Other Ambulatory Visit: Payer: Self-pay | Admitting: Psychiatry

## 2020-07-05 DIAGNOSIS — F319 Bipolar disorder, unspecified: Secondary | ICD-10-CM

## 2020-07-07 NOTE — Telephone Encounter (Signed)
Check on refill

## 2020-07-08 DIAGNOSIS — G629 Polyneuropathy, unspecified: Secondary | ICD-10-CM | POA: Diagnosis not present

## 2020-07-08 DIAGNOSIS — I509 Heart failure, unspecified: Secondary | ICD-10-CM | POA: Diagnosis not present

## 2020-07-08 DIAGNOSIS — M545 Low back pain, unspecified: Secondary | ICD-10-CM | POA: Diagnosis not present

## 2020-07-08 DIAGNOSIS — N3281 Overactive bladder: Secondary | ICD-10-CM | POA: Diagnosis not present

## 2020-07-08 DIAGNOSIS — E039 Hypothyroidism, unspecified: Secondary | ICD-10-CM | POA: Diagnosis not present

## 2020-07-08 DIAGNOSIS — I110 Hypertensive heart disease with heart failure: Secondary | ICD-10-CM | POA: Diagnosis not present

## 2020-07-09 DIAGNOSIS — R32 Unspecified urinary incontinence: Secondary | ICD-10-CM | POA: Diagnosis not present

## 2020-07-10 DIAGNOSIS — I110 Hypertensive heart disease with heart failure: Secondary | ICD-10-CM | POA: Diagnosis not present

## 2020-07-10 DIAGNOSIS — N3281 Overactive bladder: Secondary | ICD-10-CM | POA: Diagnosis not present

## 2020-07-10 DIAGNOSIS — M545 Low back pain, unspecified: Secondary | ICD-10-CM | POA: Diagnosis not present

## 2020-07-10 DIAGNOSIS — I509 Heart failure, unspecified: Secondary | ICD-10-CM | POA: Diagnosis not present

## 2020-07-10 DIAGNOSIS — G629 Polyneuropathy, unspecified: Secondary | ICD-10-CM | POA: Diagnosis not present

## 2020-07-10 DIAGNOSIS — E039 Hypothyroidism, unspecified: Secondary | ICD-10-CM | POA: Diagnosis not present

## 2020-07-11 ENCOUNTER — Telehealth: Payer: Self-pay | Admitting: Cardiovascular Disease

## 2020-07-11 DIAGNOSIS — I509 Heart failure, unspecified: Secondary | ICD-10-CM | POA: Diagnosis not present

## 2020-07-11 DIAGNOSIS — G629 Polyneuropathy, unspecified: Secondary | ICD-10-CM | POA: Diagnosis not present

## 2020-07-11 DIAGNOSIS — I110 Hypertensive heart disease with heart failure: Secondary | ICD-10-CM | POA: Diagnosis not present

## 2020-07-11 DIAGNOSIS — M545 Low back pain, unspecified: Secondary | ICD-10-CM | POA: Diagnosis not present

## 2020-07-11 DIAGNOSIS — N3281 Overactive bladder: Secondary | ICD-10-CM | POA: Diagnosis not present

## 2020-07-11 DIAGNOSIS — E039 Hypothyroidism, unspecified: Secondary | ICD-10-CM | POA: Diagnosis not present

## 2020-07-11 MED ORDER — TORSEMIDE 20 MG PO TABS: ORAL_TABLET | ORAL | 1 refills | Status: DC

## 2020-07-11 NOTE — Telephone Encounter (Signed)
    Pt c/o medication issue:  1. Name of Medication: torsemide (DEMADEX) 20 MG tablet  2. How are you currently taking this medication (dosage and times per day)? As directed   3. Are you having a reaction (difficulty breathing--STAT)? no  4. What is your medication issue? Patient wanted to know if she could reduce the amount of fluid medication she is taking. She said the tightness in her legs has gotten better.  Please advise

## 2020-07-11 NOTE — Telephone Encounter (Signed)
Spoke with Dr. Audie Box- okay for patient to decrease dosage to 40mg  once daily.   Spoke with patient, made patient aware of Dr. Kathalene Frames recommendations. Patient verbalized understanding.   Dosage updated in patients chart to reflect change.

## 2020-07-11 NOTE — Telephone Encounter (Signed)
Returned call to patient who states that she has been taking Torsemide 40mg  One day, 60mg  the next day and then alternating like that for that last several weeks. Patient states that her edema in her legs and ankles has improved and would like to know if Dr. Audie Box would be okay for her to decrease her dosage. Patient states her shortness of breath has not worsened and denies any other symptoms. Advised patient I would forward message to Dr. Audie Box to make him aware. Patient verbalized understanding.

## 2020-07-11 NOTE — Telephone Encounter (Signed)
OK to decrease dose.   [place] T. Audie Box, MD, [place]  [address], [place],  [address] [phone]  5:16 PM

## 2020-07-14 ENCOUNTER — Other Ambulatory Visit: Payer: Self-pay | Admitting: Psychiatry

## 2020-07-14 DIAGNOSIS — F411 Generalized anxiety disorder: Secondary | ICD-10-CM

## 2020-07-14 NOTE — Telephone Encounter (Signed)
Please review

## 2020-07-15 ENCOUNTER — Other Ambulatory Visit: Payer: Self-pay | Admitting: Psychiatry

## 2020-07-15 DIAGNOSIS — E039 Hypothyroidism, unspecified: Secondary | ICD-10-CM | POA: Diagnosis not present

## 2020-07-15 DIAGNOSIS — M545 Low back pain, unspecified: Secondary | ICD-10-CM | POA: Diagnosis not present

## 2020-07-15 DIAGNOSIS — I110 Hypertensive heart disease with heart failure: Secondary | ICD-10-CM | POA: Diagnosis not present

## 2020-07-15 DIAGNOSIS — I509 Heart failure, unspecified: Secondary | ICD-10-CM | POA: Diagnosis not present

## 2020-07-15 DIAGNOSIS — G629 Polyneuropathy, unspecified: Secondary | ICD-10-CM | POA: Diagnosis not present

## 2020-07-15 DIAGNOSIS — N3281 Overactive bladder: Secondary | ICD-10-CM | POA: Diagnosis not present

## 2020-07-15 DIAGNOSIS — F3162 Bipolar disorder, current episode mixed, moderate: Secondary | ICD-10-CM

## 2020-07-16 ENCOUNTER — Ambulatory Visit (INDEPENDENT_AMBULATORY_CARE_PROVIDER_SITE_OTHER): Payer: Medicare Other | Admitting: Psychiatry

## 2020-07-16 ENCOUNTER — Encounter: Payer: Self-pay | Admitting: Psychiatry

## 2020-07-16 ENCOUNTER — Other Ambulatory Visit: Payer: Self-pay

## 2020-07-16 DIAGNOSIS — F411 Generalized anxiety disorder: Secondary | ICD-10-CM

## 2020-07-16 DIAGNOSIS — F3162 Bipolar disorder, current episode mixed, moderate: Secondary | ICD-10-CM | POA: Diagnosis not present

## 2020-07-16 DIAGNOSIS — F900 Attention-deficit hyperactivity disorder, predominantly inattentive type: Secondary | ICD-10-CM

## 2020-07-16 DIAGNOSIS — F515 Nightmare disorder: Secondary | ICD-10-CM

## 2020-07-16 DIAGNOSIS — G4733 Obstructive sleep apnea (adult) (pediatric): Secondary | ICD-10-CM | POA: Diagnosis not present

## 2020-07-16 MED ORDER — LORAZEPAM 2 MG/ML PO CONC: 0.4000 mg | Freq: Four times a day (QID) | ORAL | 0 refills | Status: DC | PRN
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
## 2020-07-16 NOTE — Telephone Encounter (Signed)
Check on refill.  Also with the dose has changed inform the patient because she is easily confused.

## 2020-07-16 NOTE — Telephone Encounter (Signed)
I called Jessica [name] to see how she was doing on the medication,and she said she wanted to talk to you about it so I will hold off on sending Rx until you discuss with her.

## 2020-07-16 NOTE — Telephone Encounter (Signed)
She has apt today

## 2020-07-16 NOTE — Telephone Encounter (Signed)
Check on refill.  Double check dose in the last notes.  Believe it has changed

## 2020-07-23 ENCOUNTER — Ambulatory Visit: Payer: Medicare Other | Admitting: Rheumatology

## 2020-07-29 DIAGNOSIS — S46119A Strain of muscle, fascia and tendon of long head of biceps, unspecified arm, initial encounter: Secondary | ICD-10-CM | POA: Diagnosis not present

## 2020-07-29 DIAGNOSIS — B354 Tinea corporis: Secondary | ICD-10-CM | POA: Diagnosis not present

## 2020-07-29 DIAGNOSIS — J069 Acute upper respiratory infection, unspecified: Secondary | ICD-10-CM | POA: Diagnosis not present

## 2020-07-29 DIAGNOSIS — R202 Paresthesia of skin: Secondary | ICD-10-CM | POA: Diagnosis not present

## 2020-07-30 ENCOUNTER — Ambulatory Visit: Payer: Medicare Other | Admitting: Cardiovascular Disease

## 2020-07-30 NOTE — Progress Notes (Deleted)
Cardiology Office Note:   Date:  07/30/2020  NAME:  Jessica [name]    MRN: [id number] DOB:  Feb 21, 1951   PCP:  Alroy Dust, L.Marlou Sa, MD  Cardiologist:  Evalina Field, MD  Electrophysiologist:  None   Referring MD: Aurea Graff.Marlou Sa, MD   No chief complaint on file. ***  History of Present Illness:   Jessica [name] is a 70 y.o. female with a hx of obesity, HFpEF, RV failure, obesity, HTN who presents for follow-up.     Problem List: 1. HFpEF -60-65%, G1DD 2. RV Failure 2/2 OSA/OHS 3. Morbid Obesity -BMI 53 4. HTN  Past Medical History: Past Medical History:  Diagnosis Date  . Arthritis    lower back, hands, knees  . Bipolar disorder ([place])   . Cellulitis and abscess of left leg 11/29/2018  . CHF (congestive heart failure) ([place])   . Depression   . Dyspnea    with exertion  . GERD (gastroesophageal reflux disease)    patient unsure about this dx - no meds  . Headache   . History of IBS    watches diet  . Hyperlipidemia   . Hypertension   . Hypothyroidism   . Lung edema, acute, with congestive heart failure ([place])   . Obese   . Plantar fasciitis   . Seasonal allergies   . Sleep apnea 2017   uses CPAP   . Spinal stenosis of lumbar region   . SVD (spontaneous vaginal delivery)    x 2  . Systemic lupus ([place])   . Thyroid disease     Past Surgical History: Past Surgical History:  Procedure Laterality Date  . CARPAL TUNNEL RELEASE Left 09/16/2017   Procedure: LEFT CARPAL TUNNEL RELEASE;  Surgeon: Daryll Brod, MD;  Location: [facility];  Service: Orthopedics;  Laterality: Left;  . carpel tunnel surgery Right   . CATARACT EXTRACTION Bilateral 2007   w/ lens implants  . COLONOSCOPY    . DILATION AND CURETTAGE OF UTERUS    . EYE SURGERY    . FOOT SURGERY Right    hammer toe  . FRACTURE SURGERY     R tibia and fibula  . LEG SURGERY Right   . TONSILLECTOMY    . WISDOM TOOTH EXTRACTION      Current Medications: No outpatient medications have been  marked as taking for the 07/30/20 encounter (Appointment) with Geralynn Rile, MD.     Allergies:    Ibuprofen, Codeine, Nabumetone, Other, Promethazine, Amoxicillin-pot clavulanate, Erythromycin base, and Promethazine-codeine   Social History: Social History   Socioeconomic History  . Marital status: Married    Spouse name: Not on file  . Number of children: Not on file  . Years of education: Not on file  . Highest education level: Not on file  Occupational History  . Not on file  Tobacco Use  . Smoking status: Never Smoker  . Smokeless tobacco: Never Used  Vaping Use  . Vaping Use: Never used  Substance and Sexual Activity  . Alcohol use: Not Currently  . Drug use: Never  . Sexual activity: Not on file  Other Topics Concern  . Not on file  Social History Narrative   She lives in a single level home with her husband.  She has two grown children.   She taught college accounting courses, retired in 2005.    Highest level of education:  Designer, jewellery in [employer]   Social Determinants of Health   Financial Resource Strain: Not on file  Food Insecurity: Not on file  Transportation Needs: Not on file  Physical Activity: Not on file  Stress: Not on file  Social Connections: Not on file     Family History: The patient's ***family history includes Bipolar disorder in her father; Breast cancer in her mother; Dementia in her father; Diabetes in her father; Heart disease in her father and mother; Thyroid disease in her mother.  ROS:   All other ROS reviewed and negative. Pertinent positives noted in the HPI.     EKGs/Labs/Other Studies Reviewed:   The following studies were personally reviewed by me today:  EKG:  EKG is *** ordered today.  The ekg ordered today demonstrates ***, and was personally reviewed by me.  TTE 12/02/2018 1. The left ventricle has normal systolic function with an ejection  fraction of 60-65%. The cavity size was normal. There is mild concentric   left ventricular hypertrophy. Left ventricular diastolic Doppler  parameters are consistent with impaired  relaxation. Indeterminate filling pressures.  2. The right ventricle has mildly reduced systolic function. The cavity  was mildly enlarged. There is no increase in right ventricular wall  thickness.  3. The aortic valve is tricuspid. Moderate aortic annular calcification  noted.  4. The mitral valve is grossly normal. There is mild mitral annular  calcification present.  5. The tricuspid valve is grossly normal.  6. The aorta is normal in size and structure.    Recent Labs: 04/18/2020: ALT 16; B Natriuretic Peptide 37.2 04/21/2020: Hemoglobin 12.6; Platelets 348 06/10/2020: BUN 25; Creatinine, Ser 1.05; Potassium 4.8; Sodium 138   Recent Lipid Panel    Component Value Date/Time   CHOL 173 01/23/2013 1105   TRIG 271 (H) 01/23/2013 1105   HDL 51 01/23/2013 1105   CHOLHDL 3.4 01/23/2013 1105   VLDL 54 (H) 01/23/2013 1105   LDLCALC 68 01/23/2013 1105    Physical Exam:   VS:  There were no vitals taken for this visit.   Wt Readings from Last 3 Encounters:  06/10/20 (!) 316 lb (143.3 kg)  05/27/20 (!) 319 lb 12.8 oz (145.1 kg)  05/26/20 (!) 319 lb 8 oz (144.9 kg)    General: Well nourished, well developed, in no acute distress Head: Atraumatic, normal size  Eyes: PEERLA, EOMI  Neck: Supple, no JVD Endocrine: No thryomegaly Cardiac: Normal S1, S2; RRR; no murmurs, rubs, or gallops Lungs: Clear to auscultation bilaterally, no wheezing, rhonchi or rales  Abd: Soft, nontender, no hepatomegaly  Ext: No edema, pulses 2+ Musculoskeletal: No deformities, BUE and BLE strength normal and equal Skin: Warm and dry, no rashes   Neuro: Alert and oriented to person, place, time, and situation, CNII-XII grossly intact, no focal deficits  Psych: Normal mood and affect   ASSESSMENT:   Jessica [name] is a 70 y.o. female who presents for the following: No diagnosis  found.  PLAN:   There are no diagnoses linked to this encounter.  Disposition: No follow-ups on file.  Medication Adjustments/Labs and Tests Ordered: Current medicines are reviewed at length with the patient today.  Concerns regarding medicines are outlined above.  No orders of the defined types were placed in this encounter.  No orders of the defined types were placed in this encounter.   There are no Patient Instructions on file for this visit.   Time Spent with Patient: I have spent a total of *** minutes with patient reviewing hospital notes, telemetry, EKGs, labs and examining the patient as well as establishing an  assessment and plan that was discussed with the patient.  > 50% of time was spent in direct patient care.  Signed, Addison Naegeli. Audie Box, MD, [facility]  [address], [place], [place] [address] [phone]  07/30/2020 6:27 AM

## 2020-08-14 DIAGNOSIS — H0288B Meibomian gland dysfunction left eye, upper and lower eyelids: Secondary | ICD-10-CM | POA: Diagnosis not present

## 2020-08-14 DIAGNOSIS — H0288A Meibomian gland dysfunction right eye, upper and lower eyelids: Secondary | ICD-10-CM | POA: Diagnosis not present

## 2020-08-14 DIAGNOSIS — H16223 Keratoconjunctivitis sicca, not specified as Sjogren's, bilateral: Secondary | ICD-10-CM | POA: Diagnosis not present

## 2020-08-14 DIAGNOSIS — Z961 Presence of intraocular lens: Secondary | ICD-10-CM | POA: Diagnosis not present

## 2020-08-14 DIAGNOSIS — H353132 Nonexudative age-related macular degeneration, bilateral, intermediate dry stage: Secondary | ICD-10-CM | POA: Diagnosis not present

## 2020-08-20 ENCOUNTER — Ambulatory Visit (INDEPENDENT_AMBULATORY_CARE_PROVIDER_SITE_OTHER): Payer: Medicare Other | Admitting: Pulmonary Disease

## 2020-08-20 ENCOUNTER — Encounter: Payer: Self-pay | Admitting: Pulmonary Disease

## 2020-08-20 ENCOUNTER — Other Ambulatory Visit: Payer: Self-pay

## 2020-08-20 VITALS — BP 130/84 | HR 93 | Temp 98.2°F | Ht 62.0 in | Wt 318.0 lb

## 2020-08-20 DIAGNOSIS — J9611 Chronic respiratory failure with hypoxia: Secondary | ICD-10-CM | POA: Diagnosis not present

## 2020-08-20 DIAGNOSIS — G4733 Obstructive sleep apnea (adult) (pediatric): Secondary | ICD-10-CM | POA: Diagnosis not present

## 2020-08-20 DIAGNOSIS — J301 Allergic rhinitis due to pollen: Secondary | ICD-10-CM | POA: Diagnosis not present

## 2020-08-20 MED ORDER — IPRATROPIUM BROMIDE HFA 17 MCG/ACT IN AERS: 2.0000 | INHALATION_SPRAY | RESPIRATORY_TRACT | 6 refills | Status: AC | PRN

## 2020-08-20 NOTE — Patient Instructions (Addendum)
Improve your lung health by exercising daily --Do deep breathing exercises every as we discussed --Protect yourself from environmental allergens by wearing a mask when able  Allergic Rhinitis to pollen --Continue claritin daily --Continue flonase --Recommend saline rinses as needed  OSA --Refer to Respiratory or DME for mask refitting --Continue CPAP nightly per Sleep  Chronic diastolic heart failure --Will message Dr. Audie Box for sooner follow-up  Follow-up with me in 3 months

## 2020-08-20 NOTE — Progress Notes (Signed)
Subjective:   PATIENT ID: Jessica [name] GENDER: female DOB: 18-Mar-1951, MRN: [id number]   HPI  Chief Complaint  Patient presents with  . Follow-up    Allergies    Reason for Visit: Follow-up   Jessica [name] is a 70 year old female never smoker with chronic hypoxemic respiratory failure, OSA on CPAP, spinal stenosis and lupus who presents for follow-up.  She reports improvement in her respiratory symptoms when at the beach however returning home, the pollen has been triggering her including wheezing, shortness of breath and fatigue. She is using her albuterol 2-3 times daily.  She is compliant with her CPAP however air leakage with her current nasal pap which makes it difficult to tolerate. At baseline, she does minimal activity with mainly sits and lays on her couch.   Social History: Never smoker  I have personally reviewed patient's past medical/family/social history/allergies/current medications.  Past Medical History:  Diagnosis Date  . Arthritis    lower back, hands, knees  . Bipolar disorder ([place])   . Cellulitis and abscess of left leg 11/29/2018  . CHF (congestive heart failure) ([place])   . Depression   . Dyspnea    with exertion  . GERD (gastroesophageal reflux disease)    patient unsure about this dx - no meds  . Headache   . History of IBS    watches diet  . Hyperlipidemia   . Hypertension   . Hypothyroidism   . Lung edema, acute, with congestive heart failure ([place])   . Obese   . Plantar fasciitis   . Seasonal allergies   . Sleep apnea 2017   uses CPAP   . Spinal stenosis of lumbar region   . SVD (spontaneous vaginal delivery)    x 2  . Systemic lupus ([place])   . Thyroid disease      Outpatient Medications Prior to Visit  Medication Sig Dispense Refill  . acetaminophen (TYLENOL) 650 MG CR tablet Take 650 mg by mouth daily.    . Acetylcysteine 600 MG CAPS Take 600 mg by mouth 2 (two) times daily. NAC    . Carboxymeth-Glycerin-Polysorb  (REFRESH OPTIVE ADVANCED OP) Apply to eye.    . cycloSPORINE (RESTASIS) 0.05 % ophthalmic emulsion Place 1 drop into both eyes 2 (two) times daily.    . DULoxetine (CYMBALTA) 60 MG capsule TAKE 1 CAPSULE BY MOUTH EVERY DAY 30 capsule 0  . econazole nitrate 1 % cream Apply topically daily.    Marland Kitchen gabapentin (NEURONTIN) 300 MG capsule 1 IN AM AND 3 AT EVENING 360 capsule 1  . ipratropium (ATROVENT HFA) 17 MCG/ACT inhaler Inhale 2 puffs into the lungs every 4 (four) hours as needed for wheezing. 1 each 12  . levothyroxine (SYNTHROID) 25 MCG tablet Take 25 mcg by mouth daily before breakfast.     . lithium carbonate 150 MG capsule TAKE 5 CAPSULES (750 MG TOTAL) BY MOUTH AT BEDTIME. 450 capsule 0  . LORazepam (LORAZEPAM INTENSOL) 2 MG/ML concentrated solution Take 0.2 mLs (0.4 mg total) by mouth every 6 (six) hours as needed for anxiety (agitation). 30 mL 0  . lurasidone (LATUDA) 40 MG TABS tablet Take 1 tablet (40 mg total) by mouth daily. 30 tablet 1  . metoprolol tartrate (LOPRESSOR) 25 MG tablet Take 0.5 tablets (12.5 mg total) by mouth 2 (two) times daily. 60 tablet 0  . Multiple Vitamins-Minerals (MULTIVITAMIN WITH MINERALS) tablet Take 1 tablet by mouth daily.    . Multiple Vitamins-Minerals (PRESERVISION AREDS  2) CAPS Take 1 capsule 2 (two) times daily by mouth.    . neomycin-polymyxin b-dexamethasone (MAXITROL) 3.5-10000-0.1 SUSP Place into the left eye.    . potassium chloride SA (KLOR-CON) 20 MEQ tablet Take 1 tablet by mouth daily with Torsemide 90 tablet 1  . pravastatin (PRAVACHOL) 20 MG tablet Take 20 mg at bedtime by mouth.     . SYNTHROID 200 MCG tablet TAKE 1 TABLET BY MOUTH EVERY MORNING ON AN EMPTY STOMACH    . Thiamine HCl (VITAMIN B1) 100 MG TABS Take 1 tablet by mouth daily.    Marland Kitchen torsemide (DEMADEX) 20 MG tablet Take 40 mg Once daily 180 tablet 1  . vitamin B-12 (CYANOCOBALAMIN) 100 MCG tablet Take 100 mcg by mouth 2 (two) times daily.    . Vitamin D, Ergocalciferol, (DRISDOL)  1.25 MG (50000 UNIT) CAPS capsule TAKE 1 CAPSULE (50,000 UNITS TOTAL) BY MOUTH EVERY 7 (SEVEN) DAYS. 12 capsule 2  . albuterol (VENTOLIN HFA) 108 (90 Base) MCG/ACT inhaler Inhale 2 puffs into the lungs every 6 (six) hours as needed for wheezing or shortness of breath. (Patient not taking: Reported on 08/20/2020) 8 g 6   No facility-administered medications prior to visit.    Review of Systems  Constitutional: Negative for chills, diaphoresis, fever, malaise/fatigue and weight loss.  HENT: Positive for congestion.   Respiratory: Positive for cough, shortness of breath and wheezing. Negative for hemoptysis and sputum production.   Cardiovascular: Negative for chest pain, palpitations and leg swelling.  Endo/Heme/Allergies: Positive for environmental allergies.    Objective:   Vitals:   08/20/20 1444  BP: 130/84  Pulse: 93  Temp: 98.2 F (36.8 C)  SpO2: 98%  Weight: (!) 318 lb (144.2 kg)  Height: 5\' 2"  (1.575 m)   Body mass index is 58.16 kg/m. SpO2: 98 % O2 Device: None (Room air)  Physical Exam: General: Obese, chronically ill-appearing, no acute distress HENT: [place], AT, OP clear, MMM Eyes: EOMI, no scleral icterus Respiratory: Clear to auscultation bilaterally.  No crackles, wheezing or rales Cardiovascular: RRR, -M/R/G, no JVD Extremities: Non pitting edema in lower extremities,-tenderness Neuro: AAO x4, CNII-XII grossly intact Psych: Normal mood, normal affect  Data Reviewed:  Imaging: CXR 11/16/18 - No infiltrate, edema or effusion. Enlarged cardiac silhouette CXR 12/09/18 - Cardiomegaly, bibasilar atelectasis CTA 02/29/20 - No pulmonary embolism. LLL lung nodule 6x67mm.   PFT: 01/29/19 FVC 0.61 (21%) FEV1 0.61 (28%) Ratio 100 Interpretation: No obstructive defect. Reduced FVC and FEV1 suggestive of restrictive defect  Sleep Study: 02/2016 - AHI 133  Ambulatory O2 11/22/18 Patient Saturations on Room Air at Rest = 88% Patient Saturations on Room Air while  Ambulating = 88% Patient Saturations on 4 Liters of oxygen while Ambulating = 91%  Recommend 2L of O2 at rest, 4L of O2 with exertion  Ambulatory O2 04/09/19  Patient Saturations on Room Air at Rest = 92% Patient Saturations on Room Air while Ambulating = 87% Patient Saturations on 2Liters of oxygen while Ambulating = 95%  Recommend 0L of O2 via nasal cannula at rest  Recommend 2L of O2 via nasal cannula with exertion and sleep  TTE  12/02/18 - EF 60-65%. Diastolic heart failure, mildly decreased RV systolic function. Aortic and mitral annular calcification.  Imaging, labs and test noted above have been reviewed independently by me.    Assessment & Plan:   Discussion: 70 year old female never smoker with morbid obesity, OSA on CPAP, chronic hypoxemic respiratory failure, chronic diastolic heart failure who  presents for follow-up. Counseled on oxygen compliance and regular activity. Discussed optimizing management for allergic rhinitis. CPAP has not as efficient and will need mask refitting.  Chronic hypoxemic respiratory failure, secondary to untreated OSA and suspected PH CONTINUE supplemental oxygen with exertion for >88%. Counseled on compliance to therapy CONTINUE atrovent inhaler TWO puffs every four hours as needed. Use with SPACER. REFILL She declined pulmonary rehab. Discussed daily deep breathing exercises for pulmonary hygiene  Allergic Rhinitis to pollen - uncontrolled --Continue claritin daily --Continue flonase --Recommend saline rinses as needed  OSA - uncontrolled --Refer to Respiratory or DME for mask refitting --Continue CPAP nightly per Sleep  Left lower lobe pulmonary nodule --Discuss at next visit. Consider CT Chest in 76/1607  Chronic diastolic heart failure --Will message Dr. Audie Box for sooner follow-up  Morbid Obesity  Orders Placed This Encounter  Procedures  . Ambulatory Referral for DME    Referral Priority:   Routine    Referral Type:    Durable Medical Equipment Purchase    Number of Visits Requested:   1   Meds ordered this encounter  Medications  . ipratropium (ATROVENT HFA) 17 MCG/ACT inhaler    Sig: Inhale 2 puffs into the lungs every 4 (four) hours as needed for wheezing.    Dispense:  1 each    Refill:  6   Return in about 3 months (around 11/19/2020).    [place], MD [facility] 08/20/2020 2:49 PM  Office Number [phone]

## 2020-08-22 ENCOUNTER — Other Ambulatory Visit: Payer: Self-pay | Admitting: Psychiatry

## 2020-08-22 ENCOUNTER — Telehealth: Payer: Self-pay | Admitting: Pulmonary Disease

## 2020-08-22 DIAGNOSIS — J9611 Chronic respiratory failure with hypoxia: Secondary | ICD-10-CM

## 2020-08-22 DIAGNOSIS — F411 Generalized anxiety disorder: Secondary | ICD-10-CM

## 2020-08-22 NOTE — Progress Notes (Deleted)
Office Visit Note  Patient: Jessica [name]             Date of Birth: 07/23/1950           MRN: [id number]             PCP: Aurea Graff.Marlou Sa, MD Referring: Alroy Dust, Carlean Jews.Marlou Sa, MD Visit Date: 09/04/2020 Occupation: @GUAROCC @  Subjective:  No chief complaint on file.   History of Present Illness: Jessica [name] is a 70 y.o. female ***   Activities of Daily Living:  Patient reports morning stiffness for *** {minute/hour:19697}.   Patient {ACTIONS;DENIES/REPORTS:21021675::"Denies"} nocturnal pain.  Difficulty dressing/grooming: {ACTIONS;DENIES/REPORTS:21021675::"Denies"} Difficulty climbing stairs: {ACTIONS;DENIES/REPORTS:21021675::"Denies"} Difficulty getting out of chair: {ACTIONS;DENIES/REPORTS:21021675::"Denies"} Difficulty using hands for taps, buttons, cutlery, and/or writing: {ACTIONS;DENIES/REPORTS:21021675::"Denies"}  No Rheumatology ROS completed.   PMFS History:  Patient Active Problem List   Diagnosis Date Noted  . Chronic hypoxemic respiratory failure ([place])   . Morbid obesity due to excess calories ([place])   . Bipolar disorder ([place])   . Cellulitis 11/29/2018  . OSA (obstructive sleep apnea) 11/27/2018  . Primary osteoarthritis of both hands 04/19/2017  . Primary osteoarthritis of both knees 04/19/2017  . Primary osteoarthritis of both feet 04/19/2017  . DDD (degenerative disc disease), lumbar 04/19/2017  . History of bipolar disorder 04/19/2017  . Hypothyroidism 06/04/2014  . Essential hypertension 01/23/2013  . HLD (hyperlipidemia) 01/23/2013    Past Medical History:  Diagnosis Date  . Arthritis    lower back, hands, knees  . Bipolar disorder ([place])   . Cellulitis and abscess of left leg 11/29/2018  . CHF (congestive heart failure) ([facility])   . Depression   . Dyspnea    with exertion  . GERD (gastroesophageal reflux disease)    patient unsure about this dx - no meds  . Headache   . History of IBS    watches diet  . Hyperlipidemia   .  Hypertension   . Hypothyroidism   . Lung edema, acute, with congestive heart failure ([place])   . Obese   . Plantar fasciitis   . Seasonal allergies   . Sleep apnea 2017   uses CPAP   . Spinal stenosis of lumbar region   . SVD (spontaneous vaginal delivery)    x 2  . Systemic lupus ([place])   . Thyroid disease     Family History  Problem Relation Age of Onset  . Thyroid disease Mother   . Breast cancer Mother   . Heart disease Mother   . Bipolar disorder Father   . Diabetes Father   . Heart disease Father   . Dementia Father    Past Surgical History:  Procedure Laterality Date  . CARPAL TUNNEL RELEASE Left 09/16/2017   Procedure: LEFT CARPAL TUNNEL RELEASE;  Surgeon: Daryll Brod, MD;  Location: [facility];  Service: Orthopedics;  Laterality: Left;  . carpel tunnel surgery Right   . CATARACT EXTRACTION Bilateral 2007   w/ lens implants  . COLONOSCOPY    . DILATION AND CURETTAGE OF UTERUS    . EYE SURGERY    . FOOT SURGERY Right    hammer toe  . FRACTURE SURGERY     R tibia and fibula  . LEG SURGERY Right   . TONSILLECTOMY    . WISDOM TOOTH EXTRACTION     Social History   Social History Narrative   She lives in a single level home with her husband.  She has two grown children.   She taught college accounting courses,  retired in 2005.    Highest level of education:  Doctorate in Administrator, sports History  Administered Date(s) Administered  . Influenza Split 02/06/2009, 02/25/2010, 03/10/2011  . Influenza, High Dose Seasonal PF 02/12/2019, 05/13/2020  . Influenza,inj,quad, With Preservative 03/26/2014  . PFIZER(Purple Top)SARS-COV-2 Vaccination 06/24/2019, 07/16/2019, 04/21/2020  . Pneumococcal Conjugate-13 10/04/2016  . Td 11/19/2016  . Tdap 08/02/2006  . Zoster 07/09/2014     Objective: Vital Signs: There were no vitals taken for this visit.   Physical Exam   Musculoskeletal Exam: ***  CDAI Exam: CDAI Score: -- Patient Global: --; Provider Global:  -- Swollen: --; Tender: -- Joint Exam 09/04/2020   No joint exam has been documented for this visit   There is currently no information documented on the homunculus. Go to the Rheumatology activity and complete the homunculus joint exam.  Investigation: No additional findings.  Imaging: No results found.  Recent Labs: Lab Results  Component Value Date   WBC 12.2 (H) 04/21/2020   HGB 12.6 04/21/2020   PLT 348 04/21/2020   NA 138 06/10/2020   K 4.8 06/10/2020   CL 98 06/10/2020   CO2 26 06/10/2020   GLUCOSE 104 (H) 06/10/2020   BUN 25 06/10/2020   CREATININE 1.05 (H) 06/10/2020   BILITOT 0.2 (L) 04/18/2020   ALKPHOS 119 04/18/2020   AST 19 04/18/2020   ALT 16 04/18/2020   PROT 6.1 (L) 04/18/2020   ALBUMIN 3.3 (L) 04/18/2020   CALCIUM 10.6 (H) 06/10/2020   GFRAA 62 06/10/2020    Speciality Comments: No specialty comments available.  Procedures:  No procedures performed Allergies: Ibuprofen, Codeine, Nabumetone, Other, Promethazine, Amoxicillin-pot clavulanate, Erythromycin base, and Promethazine-codeine   Assessment / Plan:     Visit Diagnoses: No diagnosis found.  Orders: No orders of the defined types were placed in this encounter.  No orders of the defined types were placed in this encounter.   Face-to-face time spent with patient was *** minutes. Greater than 50% of time was spent in counseling and coordination of care.  Follow-Up Instructions: No follow-ups on file.   Earnestine Mealing, CMA  Note - This record has been created using Editor, commissioning.  Chart creation errors have been sought, but may not always  have been located. Such creation errors do not reflect on  the standard of medical care.

## 2020-08-22 NOTE — Telephone Encounter (Signed)
ATC x1, left message to return call.

## 2020-08-25 NOTE — Telephone Encounter (Signed)
Called and spoke with pt who said she had a phone call with Dr. Loanne Drilling prior to her follow up mentioning palliative care. Pt said that she meant to talk about it further with Dr. Loanne Drilling when she was here for her OV 4/20 but she forgot all about it. Pt wanted to know if this could be further discussed with her and also wanted to know if there was a way she could get more info on this (pamphlets, etc). Dr. Loanne Drilling, please advise.

## 2020-08-27 ENCOUNTER — Other Ambulatory Visit: Payer: Self-pay | Admitting: Pulmonary Disease

## 2020-08-27 NOTE — Telephone Encounter (Signed)
Please let patient know we are happy to refer her to Palliative to discuss pain and symptom management. In general, it is a good opportunity for her to establish a relationship with the Palliative care team and discuss her goals of care for her medical management.

## 2020-08-27 NOTE — Telephone Encounter (Signed)
I called and spoke with patient regarding Dr. Loanne Drilling message. Patient verbalized understanding and wishes to continue with referral. I have sent in referral and informed patient they will be reaching out for an appt and told patient to call with any other questions/concerns. Patient verbalized understanding, nothing further needed.

## 2020-08-28 ENCOUNTER — Telehealth: Payer: Self-pay | Admitting: Cardiovascular Disease

## 2020-08-28 ENCOUNTER — Telehealth: Payer: Self-pay

## 2020-08-28 NOTE — Telephone Encounter (Signed)
She may decrease the dose as she has suggested.   [place] T. Audie Box, MD,   [address], [place], [place] [address] [phone]  7:50 PM

## 2020-08-28 NOTE — Telephone Encounter (Signed)
    Pt c/o medication issue:  1. Name of Medication: torsemide (DEMADEX) 20 MG tablet  2. How are you currently taking this medication (dosage and times per day)? Take 40 mg Once daily   3. Are you having a reaction (difficulty breathing--STAT)?   4. What is your medication issue? Pt would like to ask if she can take her torsemide 1 tablet of 20 mg 1 day then the next day to take 2 tablets of 20 mg total of 40 mg. She said he ok'd taking it like this before and wanted to go back to this dosage.

## 2020-08-28 NOTE — Telephone Encounter (Signed)
Spoke with patient and scheduled an in-person Palliative Consult for 10/01/20 @ 12:30PM. She requested a later appointment due to vacation.  COVID screening was negative. No pets in home. Patient lives with husband Herbie [place].  Consent obtained; updated Outlook/Netsmart/Team List and Epic.  Family is aware they may be receiving a call from NP the day before or day of to confirm appointment.

## 2020-08-28 NOTE — Telephone Encounter (Signed)
Returned call to patient who states that she would like to decrease her Torsemide dosage. Patient states she is currently taking 40mg  of Torsemide daily and would like to decrease to 40mg  one day and 20mg  the next day alternating like that.   Patient states that her swelling is much better and her shortness of breath is better as well.   Advised patient I would forward message to Dr. Audie Box for him to review.   Patient verbalized understanding.

## 2020-08-29 NOTE — Telephone Encounter (Signed)
Called patient, advised of message.  Patient verbalized understanding.  

## 2020-09-04 ENCOUNTER — Ambulatory Visit: Payer: Medicare Other | Admitting: Rheumatology

## 2020-09-04 DIAGNOSIS — Z87898 Personal history of other specified conditions: Secondary | ICD-10-CM

## 2020-09-04 DIAGNOSIS — M19071 Primary osteoarthritis, right ankle and foot: Secondary | ICD-10-CM

## 2020-09-04 DIAGNOSIS — M19041 Primary osteoarthritis, right hand: Secondary | ICD-10-CM

## 2020-09-04 DIAGNOSIS — M170 Bilateral primary osteoarthritis of knee: Secondary | ICD-10-CM

## 2020-09-04 DIAGNOSIS — Z8719 Personal history of other diseases of the digestive system: Secondary | ICD-10-CM

## 2020-09-04 DIAGNOSIS — Z8679 Personal history of other diseases of the circulatory system: Secondary | ICD-10-CM

## 2020-09-04 DIAGNOSIS — Z6841 Body Mass Index (BMI) 40.0 and over, adult: Secondary | ICD-10-CM

## 2020-09-04 DIAGNOSIS — Z8639 Personal history of other endocrine, nutritional and metabolic disease: Secondary | ICD-10-CM

## 2020-09-04 DIAGNOSIS — M5136 Other intervertebral disc degeneration, lumbar region: Secondary | ICD-10-CM

## 2020-09-04 DIAGNOSIS — Z8659 Personal history of other mental and behavioral disorders: Secondary | ICD-10-CM

## 2020-09-04 DIAGNOSIS — I5032 Chronic diastolic (congestive) heart failure: Secondary | ICD-10-CM

## 2020-09-04 DIAGNOSIS — R600 Localized edema: Secondary | ICD-10-CM

## 2020-09-09 DIAGNOSIS — L578 Other skin changes due to chronic exposure to nonionizing radiation: Secondary | ICD-10-CM | POA: Diagnosis not present

## 2020-09-09 DIAGNOSIS — I110 Hypertensive heart disease with heart failure: Secondary | ICD-10-CM | POA: Diagnosis not present

## 2020-09-09 DIAGNOSIS — B354 Tinea corporis: Secondary | ICD-10-CM | POA: Diagnosis not present

## 2020-09-09 DIAGNOSIS — R5383 Other fatigue: Secondary | ICD-10-CM | POA: Diagnosis not present

## 2020-09-09 DIAGNOSIS — R32 Unspecified urinary incontinence: Secondary | ICD-10-CM | POA: Diagnosis not present

## 2020-09-09 DIAGNOSIS — F319 Bipolar disorder, unspecified: Secondary | ICD-10-CM | POA: Diagnosis not present

## 2020-09-11 ENCOUNTER — Other Ambulatory Visit: Payer: Self-pay

## 2020-09-11 ENCOUNTER — Telehealth: Payer: Self-pay | Admitting: Psychiatry

## 2020-09-11 ENCOUNTER — Other Ambulatory Visit: Payer: Self-pay | Admitting: Psychiatry

## 2020-09-11 DIAGNOSIS — F3162 Bipolar disorder, current episode mixed, moderate: Secondary | ICD-10-CM

## 2020-09-11 MED ORDER — LURASIDONE HCL 60 MG PO TABS: 60.0000 mg | ORAL_TABLET | Freq: Every day | ORAL | 1 refills | Status: DC

## 2020-09-11 NOTE — Telephone Encounter (Signed)
Should I send 40 mg take 1 and 1/2 tab ?or is there a 60 mg dose

## 2020-09-11 NOTE — Telephone Encounter (Signed)
[name] called for [facility] to request refill of Latuda.  He said her dose has increased to 60mg  so needs a new prescription for the Latuda 60mg .  Sent to [facility] on [address], [place].  Appt 09/25/20

## 2020-09-11 NOTE — Telephone Encounter (Signed)
Sent latuda 60 mg

## 2020-09-12 ENCOUNTER — Telehealth: Payer: Self-pay | Admitting: Psychiatry

## 2020-09-12 NOTE — Telephone Encounter (Signed)
Jessica [name] called to ask if she could get in sooner. She has appt May 26, but is not feeling well at all. Is hardly getting up off of the couch and not going outside at all. There are a couple of blocked times next week. Could she move to one of those?

## 2020-09-15 ENCOUNTER — Other Ambulatory Visit: Payer: Self-pay

## 2020-09-15 ENCOUNTER — Ambulatory Visit (INDEPENDENT_AMBULATORY_CARE_PROVIDER_SITE_OTHER): Payer: Medicare Other | Admitting: Physician Assistant

## 2020-09-15 ENCOUNTER — Encounter: Payer: Self-pay | Admitting: Physician Assistant

## 2020-09-15 VITALS — BP 129/70 | HR 98 | Ht 62.0 in | Wt 323.0 lb

## 2020-09-15 DIAGNOSIS — I5032 Chronic diastolic (congestive) heart failure: Secondary | ICD-10-CM | POA: Diagnosis not present

## 2020-09-15 DIAGNOSIS — Z9989 Dependence on other enabling machines and devices: Secondary | ICD-10-CM

## 2020-09-15 DIAGNOSIS — E785 Hyperlipidemia, unspecified: Secondary | ICD-10-CM | POA: Diagnosis not present

## 2020-09-15 DIAGNOSIS — G4733 Obstructive sleep apnea (adult) (pediatric): Secondary | ICD-10-CM | POA: Diagnosis not present

## 2020-09-15 DIAGNOSIS — I10 Essential (primary) hypertension: Secondary | ICD-10-CM

## 2020-09-15 DIAGNOSIS — R5383 Other fatigue: Secondary | ICD-10-CM | POA: Diagnosis not present

## 2020-09-15 DIAGNOSIS — E039 Hypothyroidism, unspecified: Secondary | ICD-10-CM

## 2020-09-15 NOTE — Patient Instructions (Addendum)
Medication Instructions:   TAKE Torsemide 40 mg daily for 3 days then resume alternating 20 and/or 40 mg every other day.   May take an extra dose of 20 mg of Torsemide if swelling in legs occurs   Your physician recommends that you continue on your current medications as directed. Please refer to the Current Medication list given to you today.  *If you need a refill on your cardiac medications before your next appointment, please call your pharmacy*  Lab Work: Your physician recommends that you return for lab work TODAY:   BMET  CBC  TSH+Free T4  If you have labs (blood work) drawn today and your tests are completely normal, you will receive your results only by: Marland Kitchen MyChart Message (if you have MyChart) OR . A paper copy in the mail If you have any lab test that is abnormal or we need to change your treatment, we will call you to review the results.  Testing/Procedures: NONE ordered at this time of appointment   Follow-Up: At [facility], you and your health needs are our priority.  As part of our continuing mission to provide you with exceptional heart care, we have created designated Provider Care Teams.  These Care Teams include your primary Cardiologist (physician) and Advanced Practice Providers (APPs -  Physician Assistants and Nurse Practitioners) who all work together to provide you with the care you need, when you need it.   Your next appointment:   3 month(s)  The format for your next appointment:   In Person  Provider:   Eleonore Chiquito, MD  Other Instructions

## 2020-09-15 NOTE — Progress Notes (Signed)
Cardiology Office Note:    Date:  09/18/2020   ID:  Jessica [name], DOB [birth year], MRN [id number]  PCP:  Jessica [name], Jessica Jews.Jessica Sa, MD   [facility] Cardiologist:  Jessica Field, MD {   Referring MD: Jessica [name], Jessica Jews.Jessica Sa, MD   Chief Complaint  Patient presents with  . Follow-up    Seen for Dr. Audie [name]    History of Present Illness:    Jessica [name] is a 70 y.o. female with a hx of morbid obesity, hypertension, hyperlipidemia, hypothyroidism, chronic diastolic heart failure, bipolar disorder, OSA on CPAP and lupus.  It was suspected she has right heart failure secondary to obstructive sleep apnea and obesity hypoventilation syndrome.  Previous Myoview obtained on 09/14/2016 showed EF 75%, overall low risk study without significant ischemia.  Echocardiogram obtained on 12/02/2018 showed EF 60 to 65%.  Patient was last seen by Dr. Audie [name] on 06/30/2020, she was 216 pounds.  She was also taking 40 mg a.m. and 20 mg p.m. of torsemide.  Based on previous phone note, it appears since the previous visit, she has self decreased her torsemide to 40 mg daily.  She further decrease the dose to alternating 40 mg and 20 mg every other day since late April.  Patient presents today along with her husband.  She is currently taking alternating 40 mg and 20 mg every other day of torsemide.  On exam, she did Jessica have significant lower extremity edema.  She says since she returned from the beach in late [place], she had a tough time with the pollen season and also she developed incontinence issue.  After overnight sleep, she has noticed worsening edema.  She has gained some weight since earlier this year.  She has Jessica been very active at all.  This is largely contributing to her fatigue.  Given the worsening fatigue, we will obtain blood work to rule out secondary causes, include CBC, basic metabolic panel and a TSH/free T4.  For the next 3 days, I instructed her to start taking 40 mg daily of torsemide,  after that she can go back to alternating dose between 40 and 20 mg.  She has option of taking extra dose of torsemide as needed to help with the leg edema.  Unfortunately, unless she loses significant weight, her overall clinical condition likely will continue to worsen.  Past Medical History:  Diagnosis Date  . Arthritis    lower back, hands, knees  . Bipolar disorder ([place])   . Cellulitis and abscess of left leg 11/29/2018  . CHF (congestive heart failure) ([place])   . Depression   . Dyspnea    with exertion  . GERD (gastroesophageal reflux disease)    patient unsure about this dx - no meds  . Headache   . History of IBS    watches diet  . Hyperlipidemia   . Hypertension   . Hypothyroidism   . Lung edema, acute, with congestive heart failure ([place])   . Obese   . Plantar fasciitis   . Seasonal allergies   . Sleep apnea 2017   uses CPAP   . Spinal stenosis of lumbar region   . SVD (spontaneous vaginal delivery)    x 2  . Systemic lupus ([place])   . Thyroid disease     Past Surgical History:  Procedure Laterality Date  . CARPAL TUNNEL RELEASE Left 09/16/2017   Procedure: LEFT CARPAL TUNNEL RELEASE;  Surgeon: Daryll Brod, MD;  Location: [facility];  Service: Orthopedics;  Laterality:  Left;  . carpel tunnel surgery Right   . CATARACT EXTRACTION Bilateral 2007   w/ lens implants  . COLONOSCOPY    . DILATION AND CURETTAGE OF UTERUS    . EYE SURGERY    . FOOT SURGERY Right    hammer toe  . FRACTURE SURGERY     R tibia and fibula  . LEG SURGERY Right   . TONSILLECTOMY    . WISDOM TOOTH EXTRACTION      Current Medications: Current Meds  Medication Sig  . acetaminophen (TYLENOL) 650 MG CR tablet Take 650 mg by mouth daily.  . Acetylcysteine 600 MG CAPS Take 600 mg by mouth 2 (two) times daily. NAC  . Carboxymeth-Glycerin-Polysorb (REFRESH OPTIVE ADVANCED OP) Apply to eye.  . cycloSPORINE (RESTASIS) 0.05 % ophthalmic emulsion Place 1 drop into both eyes 2 (two) times daily.  .  DULoxetine (CYMBALTA) 60 MG capsule TAKE 1 CAPSULE BY MOUTH EVERY DAY  . econazole nitrate 1 % cream Apply topically daily.  Marland Kitchen gabapentin (NEURONTIN) 300 MG capsule 1 IN AM AND 3 AT EVENING  . ipratropium (ATROVENT HFA) 17 MCG/ACT inhaler Inhale 2 puffs into the lungs every 4 (four) hours as needed for wheezing.  Marland Kitchen levothyroxine (SYNTHROID) 25 MCG tablet Take 25 mcg by mouth daily before breakfast.   . lithium carbonate 150 MG capsule TAKE 5 CAPSULES (750 MG TOTAL) BY MOUTH AT BEDTIME.  . LORazepam (LORAZEPAM INTENSOL) 2 MG/ML concentrated solution Take 0.2 mLs (0.4 mg total) by mouth every 6 (six) hours as needed for anxiety (agitation). (Patient Jessica taking: Reported on 09/17/2020)  . Lurasidone HCl (LATUDA) 60 MG TABS Take 1 tablet (60 mg total) by mouth daily.  . metoprolol tartrate (LOPRESSOR) 25 MG tablet Take 0.5 tablets (12.5 mg total) by mouth 2 (two) times daily.  . Multiple Vitamins-Minerals (PRESERVISION AREDS 2) CAPS Take 1 capsule 2 (two) times daily by mouth.  . potassium chloride [name] (KLOR-CON) 20 MEQ tablet Take 1 tablet by mouth daily with Torsemide  . pravastatin (PRAVACHOL) 20 MG tablet Take 20 mg at bedtime by mouth.   . SYNTHROID 200 MCG tablet TAKE 1 TABLET BY MOUTH EVERY MORNING ON AN EMPTY STOMACH  . Thiamine HCl (VITAMIN B1) 100 MG TABS Take 1 tablet by mouth daily.  Marland Kitchen torsemide (DEMADEX) 20 MG tablet Take 40 mg Once daily (Patient taking differently: Take alternating 20 mg or 40 mg every other day)  . vitamin B-12 (CYANOCOBALAMIN) 100 MCG tablet Take 100 mcg by mouth 2 (two) times daily.  . Vitamin D, Ergocalciferol, (DRISDOL) 1.25 MG (50000 UNIT) CAPS capsule TAKE 1 CAPSULE (50,000 UNITS TOTAL) BY MOUTH EVERY 7 (SEVEN) DAYS.     Allergies:   Ibuprofen, Codeine, Nabumetone, Other, Promethazine, Vraylar [cariprazine], Amoxicillin-pot clavulanate, Erythromycin base, and Promethazine-codeine   Social History   Socioeconomic History  . Marital status: Married    Spouse  name: Jessica [name]  . Number of children: Jessica [name]  . Years of education: Jessica [name]  . Highest education level: Jessica [name]  Occupational History  . Jessica [name]  Tobacco Use  . Smoking status: Never Smoker  . Smokeless tobacco: Never Used  Vaping Use  . Vaping Use: Never used  Substance and Sexual Activity  . Alcohol use: Jessica Currently  . Drug use: Never  . Sexual activity: Jessica [name]  Other Topics Concern  . Jessica [name]  Social History Narrative   She lives in a single level home  with her husband.  She has two grown children.   She taught college accounting courses, retired in 2005.    Highest level of education:  Designer, jewellery in [employer]   Social Determinants of Health   Financial Resource Strain: Jessica [name]  Food Insecurity: Jessica [name]  Transportation Needs: Jessica [name]  Physical Activity: Jessica [name]  Stress: Jessica [name]  Social Connections: Jessica [name]     Family History: The patient's family history includes Bipolar disorder in her father; Breast cancer in her mother; Dementia in her father; Diabetes in her father; Heart disease in her father and mother; Thyroid disease in her mother.  ROS:   Please see the history of present illness.     All other systems reviewed and are negative.  EKGs/Labs/Other Studies Reviewed:    The following studies were reviewed today:  Echo 12/02/2018 1. The left ventricle has normal systolic function with an ejection  fraction of 60-65%. The cavity size was normal. There is mild concentric  left ventricular hypertrophy. Left ventricular diastolic Doppler  parameters are consistent with impaired  relaxation. Indeterminate filling pressures.  2. The right ventricle has mildly reduced systolic function. The cavity  was mildly enlarged. There is no increase in right ventricular wall  thickness.  3. The aortic valve is tricuspid. Moderate aortic annular calcification  noted.  4. The mitral valve is grossly normal. There is  mild mitral annular  calcification present.  5. The tricuspid valve is grossly normal.  6. The aorta is normal in size and structure.    EKG:  EKG is Jessica ordered today.    Recent Labs: 04/18/2020: ALT 16; B Natriuretic Peptide 37.2 09/15/2020: BUN 16; Creatinine, Ser 0.92; Hemoglobin 12.5; Platelets 381; Potassium 4.7; Sodium 136; TSH 1.200  Recent Lipid Panel    Component Value Date/Time   CHOL 173 01/23/2013 1105   TRIG 271 (H) 01/23/2013 1105   HDL 51 01/23/2013 1105   CHOLHDL 3.4 01/23/2013 1105   VLDL 54 (H) 01/23/2013 1105   LDLCALC 68 01/23/2013 1105     Risk Assessment/Calculations:       Physical Exam:    VS:  BP 129/70   Pulse 98   Ht 5\' 2"  (1.575 m)   Wt (!) 323 lb (146.5 kg)   SpO2 96%   BMI 59.08 kg/m     Wt Readings from Last 3 Encounters:  09/15/20 (!) 323 lb (146.5 kg)  08/20/20 (!) 318 lb (144.2 kg)  06/10/20 (!) 316 lb (143.3 kg)     GEN: Sitting in wheelchair, morbidly obese HEENT: Normal NECK: No JVD; No carotid bruits LYMPHATICS: No lymphadenopathy CARDIAC: RRR, no murmurs, rubs, gallops RESPIRATORY:  Clear to auscultation without rales, wheezing or rhonchi  ABDOMEN: Soft, non-tender, non-distended MUSCULOSKELETAL:  No edema; No deformity  SKIN: Warm and dry NEUROLOGIC:  Alert and oriented x 3 PSYCHIATRIC:  Normal affect   ASSESSMENT:    1. Fatigue, unspecified type   2. Chronic diastolic heart failure ([place])   3. Essential hypertension   4. Hyperlipidemia LDL goal <100   5. Hypothyroidism, unspecified type   6. Morbid obesity due to excess calories ([place])   7. OSA on CPAP    PLAN:    In order of problems listed above:  1. Fatigue: Likely related to deconditioning from obesity hyperkalemia ventilation syndrome and lack of activity.  Obtain basic metabolic panel, TSH, free T4 and CBC  2. Chronic diastolic heart failure: Mildly volume overloaded.  I have instructed her to increase torsemide to 40 mg daily for the next 3 days,  before going back to the previous dose.  She does Jessica wish to continue on the higher dose of diuretic for long period time due to incontinence.  3. Hypertension: Blood pressure stable  4. Hyperlipidemia: On pravastatin  5. Morbid obesity: Weight loss imperative.  Most of her issue is related to obesity, she likely will continue to have decline in the future unless she loses significant weight.  6. Obstructive sleep apnea: On CPAP therapy.      Medication Adjustments/Labs and Tests Ordered: Current medicines are reviewed at length with the patient today.  Concerns regarding medicines are outlined above.  Orders Placed This Encounter  Procedures  . Basic metabolic panel  . CBC  . TSH + free T4   No orders of the defined types were placed in this encounter.   Patient Instructions  Medication Instructions:   TAKE Torsemide 40 mg daily for 3 days then resume alternating 20 and/or 40 mg every other day.   May take an extra dose of 20 mg of Torsemide if swelling in legs occurs   Your physician recommends that you continue on your current medications as directed. Please refer to the Current Medication list given to you today.  *If you need a refill on your cardiac medications before your next appointment, please call your pharmacy*  Lab Work: Your physician recommends that you return for lab work TODAY:   BMET  CBC  TSH+Free T4  If you have labs (blood work) drawn today and your tests are completely normal, you will receive your results only by: Marland Kitchen MyChart Message (if you have MyChart) OR . A paper copy in the mail If you have any lab test that is abnormal or we need to change your treatment, we will call you to review the results.  Testing/Procedures: NONE ordered at this time of appointment   Follow-Up: At [facility], you and your health needs are our priority.  As part of our continuing mission to provide you with exceptional heart care, we have created designated  Provider Care Teams.  These Care Teams include your primary Cardiologist (physician) and Advanced Practice Providers (APPs -  Physician Assistants and Nurse Practitioners) who all work together to provide you with the care you need, when you need it.   Your next appointment:   3 month(s)  The format for your next appointment:   In Person  Provider:   Eleonore Chiquito, MD  Other Instructions      Signed, Almyra Deforest, [place]  09/18/2020 12:01 AM    [place]

## 2020-09-16 LAB — BASIC METABOLIC PANEL
BUN/Creatinine Ratio: 17 (ref 12–28)
BUN: 16 mg/dL (ref 8–27)
CO2: 25 mmol/L (ref 20–29)
Calcium: 10.9 mg/dL — ABNORMAL HIGH (ref 8.7–10.3)
Chloride: 97 mmol/L (ref 96–106)
Creatinine, Ser: 0.92 mg/dL (ref 0.57–1.00)
Glucose: 98 mg/dL (ref 65–99)
Potassium: 4.7 mmol/L (ref 3.5–5.2)
Sodium: 136 mmol/L (ref 134–144)
eGFR: 67 mL/min/{1.73_m2} (ref >59)

## 2020-09-16 LAB — CBC
Hematocrit: 38 % (ref 34.0–46.6)
Hemoglobin: 12.5 g/dL (ref 11.1–15.9)
MCH: 30.5 pg (ref 26.6–33.0)
MCHC: 32.9 g/dL (ref 31.5–35.7)
MCV: 93 fL (ref 79–97)
Platelets: 381 10*3/uL (ref 150–450)
RBC: 4.1 x10E6/uL (ref 3.77–5.28)
RDW: 12.7 % (ref 11.7–15.4)
WBC: 14.3 10*3/uL — ABNORMAL HIGH (ref 3.4–10.8)

## 2020-09-16 LAB — TSH+FREE T4
Free T4: 1.23 ng/dL (ref 0.82–1.77)
TSH: 1.2 u[IU]/mL (ref 0.450–4.500)

## 2020-09-17 ENCOUNTER — Other Ambulatory Visit: Payer: Self-pay

## 2020-09-17 ENCOUNTER — Ambulatory Visit (INDEPENDENT_AMBULATORY_CARE_PROVIDER_SITE_OTHER): Payer: Medicare Other | Admitting: Psychiatry

## 2020-09-17 ENCOUNTER — Encounter: Payer: Self-pay | Admitting: Psychiatry

## 2020-09-17 ENCOUNTER — Encounter: Payer: Self-pay | Admitting: Physician Assistant

## 2020-09-17 DIAGNOSIS — G4733 Obstructive sleep apnea (adult) (pediatric): Secondary | ICD-10-CM

## 2020-09-17 DIAGNOSIS — F900 Attention-deficit hyperactivity disorder, predominantly inattentive type: Secondary | ICD-10-CM

## 2020-09-17 DIAGNOSIS — F3162 Bipolar disorder, current episode mixed, moderate: Secondary | ICD-10-CM

## 2020-09-17 DIAGNOSIS — F515 Nightmare disorder: Secondary | ICD-10-CM

## 2020-09-17 DIAGNOSIS — F411 Generalized anxiety disorder: Secondary | ICD-10-CM | POA: Diagnosis not present

## 2020-09-17 MED ORDER — MODAFINIL 200 MG PO TABS
ORAL_TABLET | ORAL | 1 refills | Status: DC
Start: 2020-09-17 — End: 2020-12-09
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
## 2020-09-17 NOTE — Patient Instructions (Signed)
Start one half modafinil in the morning for 4 days then 1 each morning to help with energy and mood

## 2020-09-24 ENCOUNTER — Ambulatory Visit: Payer: Medicare Other | Admitting: Cardiovascular Disease

## 2020-09-24 DIAGNOSIS — H11423 Conjunctival edema, bilateral: Secondary | ICD-10-CM | POA: Diagnosis not present

## 2020-09-24 DIAGNOSIS — H0288A Meibomian gland dysfunction right eye, upper and lower eyelids: Secondary | ICD-10-CM | POA: Diagnosis not present

## 2020-09-24 DIAGNOSIS — H11823 Conjunctivochalasis, bilateral: Secondary | ICD-10-CM | POA: Diagnosis not present

## 2020-09-24 DIAGNOSIS — H16223 Keratoconjunctivitis sicca, not specified as Sjogren's, bilateral: Secondary | ICD-10-CM | POA: Diagnosis not present

## 2020-09-25 ENCOUNTER — Ambulatory Visit: Payer: Medicare Other | Admitting: Psychiatry

## 2020-09-28 ENCOUNTER — Other Ambulatory Visit: Payer: Self-pay | Admitting: Cardiovascular Disease

## 2020-09-30 ENCOUNTER — Telehealth: Payer: Self-pay | Admitting: Psychiatry

## 2020-09-30 NOTE — Telephone Encounter (Signed)
She has severe obstructive sleep apnea

## 2020-09-30 NOTE — Telephone Encounter (Signed)
Please review

## 2020-09-30 NOTE — Telephone Encounter (Signed)
Provigil on 5/18[facility]. They weren't faxing it to correct Dr's office. Please work on [place].A. for Provigil. ([facility]) [facility] [address]

## 2020-10-01 ENCOUNTER — Other Ambulatory Visit: Payer: Self-pay

## 2020-10-01 ENCOUNTER — Other Ambulatory Visit: Payer: Medicare Other | Admitting: Student

## 2020-10-01 DIAGNOSIS — I5032 Chronic diastolic (congestive) heart failure: Secondary | ICD-10-CM

## 2020-10-01 DIAGNOSIS — R0602 Shortness of breath: Secondary | ICD-10-CM

## 2020-10-01 DIAGNOSIS — Z515 Encounter for palliative care: Secondary | ICD-10-CM

## 2020-10-01 DIAGNOSIS — R52 Pain, unspecified: Secondary | ICD-10-CM

## 2020-10-01 NOTE — Progress Notes (Signed)
[facility] Consult Note Telephone: [phone]  Fax: [phone]    Date of encounter: 10/01/20 PATIENT NAME: Jessica [name] [address] [place] [address]   [phone] (home)  DOB: April 21, 1951 MRN: [id number] PRIMARY CARE PROVIDER:    Alroy Dust, L.Marlou Sa, MD,  [place] [employer] [address] [place] [address] [phone]  REFERRING PROVIDER:   Alroy Dust, L.Marlou Sa, [place] [employer] [place],  [place] [address] [phone]  RESPONSIBLE PARTY:    Contact Information     Name Relation Home Work [place] Spouse [phone]          I met face to face with patient and family in the home. Palliative Care was asked to follow this patient by consultation request of  Mitchell, L.Marlou Sa, MD to address advance care planning and complex medical decision making. This is the initial visit.                                     ASSESSMENT AND PLAN / RECOMMENDATIONS:   Advance Care Planning/Goals of Care: Goals include to maximize quality of life and symptom management. Our advance care planning conversation included a discussion about:    The value and importance of advance care planning  Experiences with loved ones who have been seriously ill or have died  Exploration of personal, cultural or spiritual beliefs that might influence medical decisions  Exploration of goals of care in the event of a sudden injury or illness  Identification and preparation of a healthcare agent  Introduced MOST form; patient would like to continue thinking about wishes. Will complete on next visit. CODE STATUS: Full Code  Symptom Management/Plan:  Diastolic Heart failure-EF 60-65%. Patient with shortness of breath with exertion, LE edema. Patient with chronic respiratory failure. Recommend continuing alternating torsemide 70m alternating with 227mevery other day as directed per Cardiology. Wear oxygen as  directed to keep sats > 88%. Recommend using Atrovent inhaler 2 puffs every 4 hours prn as directed. Patient encouraged to weigh herself daily. Patient with deconditioning; she declines therapy at this time. We discussed weight loss; will defer to PCP.   Bipolar-patient currently followed by psychiatry. Continue medications as directed per psychiatry. Follow up as scheduled.   Pain-patient with complaints of generalized, arthritis, neuropathy pain. WE discussed pain management clinic. She is open to pain management, but does not want to go out of the home. Will have palliative nurse navigator check to see if anyone is coming strictly in to the home, if not patient is encouraged to go out for appointments. Continue acetaminophen routinely; education provided on max 3000 mg/daily. Continue gabapentin routinely. We discussed  trying lidocaine 4% patch that she could try OTC.   Follow up Palliative Care Visit: Palliative care will continue to follow for complex medical decision making, advance care planning, and clarification of goals. Return in 8 weeks or prn.  I spent 90 minutes providing this consultation. More than 50% of the time in this consultation was spent in counseling and care coordination.   PPS: 40%  HOSPICE ELIGIBILITY/DIAGNOSIS: TBD  Chief Complaint: Palliative Medicine initial consult.  HISTORY OF PRESENT ILLNESS:  Jessica [name] a [age].o. year old female  with CHF,  chronic respiratory failure, hypertension, hyperlipidemia, hypothyroidism, bipolar, lupus, spinal stenosis, obesity, OSA, seasonal allergies, macular degeneration.   Patient resides at home with husband. She  reports increased difficulty getting out of the house. She endorses shortness of breath at rest and exertion; takes a couple minutes for breathing to return to baseline. Wears oxygen continuously. Has been wearing oxygen around 3 years. She reports arthritic pain to hips, knees, lower back. Also reports  neuropathy to hands and feet; right had is worse. Hx of carpel tunnel.  Pain is affecting her mobility. She uses rollator walker; ambulates short distances. Has an Transport planner. She reports urinary incontinence; worse in the past 3-4 months. Patient does express that she would like to lose weight. A 10 point review of systems negative, except for the pertinent positives and negatives detailed per HPI.  History obtained from review of EMR, discussion with primary team, and interview with family, facility staff/caregiver and/or Ms. Lasser.  I reviewed available labs, medications, imaging, studies and related documents from the EMR.  Records reviewed and summarized above.    Physical Exam: Constitutional: NAD General: frail appearing, obese  EYES: anicteric sclera, lids intact, no discharge  ENMT: intact hearing, oral mucous membranes moist, dentition intact CV: S1S2, RRR, 1-2+ LE edema, non-pitting Pulmonary: LCTA, increased work of breathing with exertion, no cough, sats 94% at 4lpm. Abdomen: normo-active BS + 4 quadrants, soft and non tender GU: deferred MSK:  moves all extremities, ambulatory Skin: warm and dry, no rashes or wounds on visible skin Neuro: generalized weakness, A & O x 3 Psych: non-anxious affect, easily agitated Hem/lymph/immuno: no widespread bruising   CURRENT PROBLEM LIST:  Patient Active Problem List   Diagnosis Date Noted   Chronic hypoxemic respiratory failure (HCC)    Morbid obesity due to excess calories (HCC)    Bipolar disorder ([facility])    Cellulitis 11/29/2018   OSA (obstructive sleep apnea) 11/27/2018   Primary osteoarthritis of both hands 04/19/2017   Primary osteoarthritis of both knees 04/19/2017   Primary osteoarthritis of both feet 04/19/2017   DDD (degenerative disc disease), lumbar 04/19/2017   History of bipolar disorder 04/19/2017   Hypothyroidism 06/04/2014   Essential hypertension 01/23/2013   HLD (hyperlipidemia) 01/23/2013   PAST  MEDICAL HISTORY:  Active Ambulatory Problems    Diagnosis Date Noted   Essential hypertension 01/23/2013   HLD (hyperlipidemia) 01/23/2013   Hypothyroidism 06/04/2014   Primary osteoarthritis of both hands 04/19/2017   Primary osteoarthritis of both knees 04/19/2017   Primary osteoarthritis of both feet 04/19/2017   DDD (degenerative disc disease), lumbar 04/19/2017   History of bipolar disorder 04/19/2017   OSA (obstructive sleep apnea) 11/27/2018   Cellulitis 11/29/2018   Morbid obesity due to excess calories (HCC)    Bipolar disorder (HCC)    Chronic hypoxemic respiratory failure ([place] [facility])    Resolved Ambulatory Problems    Diagnosis Date Noted   Chest pain 01/23/2013   ARF (acute renal failure) ([facility]) 01/23/2013   Leukocytosis 01/23/2013   Acute on chronic respiratory failure with hypoxemia (HCC) 11/27/2018   Lower extremity edema 11/30/2018   Systemic lupus (HCC)    Cellulitis of left leg 11/30/2018   Hypercapnic respiratory failure (HCC) 12/45/8099   Acute metabolic encephalopathy    Dyspnea 12/22/2018   Past Medical History:  Diagnosis Date   Arthritis    Cellulitis and abscess of left leg 11/29/2018   CHF (congestive heart failure) (HCC)    Depression    GERD (gastroesophageal reflux disease)    Headache    History of IBS    Hyperlipidemia    Hypertension    Lung edema, acute, with congestive heart  failure ([facility])    Obese    Plantar fasciitis    Seasonal allergies    Sleep apnea 2017   Spinal stenosis of lumbar region    SVD (spontaneous vaginal delivery)    Thyroid disease    SOCIAL HX:  Social History   Tobacco Use   Smoking status: Never Smoker   Smokeless tobacco: Never Used  Substance Use Topics   Alcohol use: Not Currently   FAMILY HX:  Family History  Problem Relation Age of Onset   Thyroid disease Mother    Breast cancer Mother    Heart disease Mother    Bipolar disorder Father    Diabetes Father    Heart disease Father    Dementia Father        ALLERGIES:  Allergies  Allergen Reactions   Ibuprofen Diarrhea, Nausea Only and Other (See Comments)    Extreme stomach pain  Makes her feel bad   Codeine     UNSPECIFIED REACTION    Nabumetone     UNSPECIFIED REACTION    Other     Seasonal allergies   Promethazine     UNSPECIFIED REACTION    Vraylar [Cariprazine]     Other reaction(s): dizziness   Amoxicillin-Pot Clavulanate Nausea And Vomiting   Erythromycin Base Diarrhea   Promethazine-Codeine Other (See Comments)    "Makes her feel bad"     PERTINENT MEDICATIONS:  Outpatient Encounter Medications as of 10/01/2020  Medication Sig   acetaminophen (TYLENOL) 650 MG CR tablet Take 650 mg by mouth daily.   Acetylcysteine 600 MG CAPS Take 600 mg by mouth 2 (two) times daily. NAC   Carboxymeth-Glycerin-Polysorb (REFRESH OPTIVE ADVANCED OP) Apply to eye.   cycloSPORINE (RESTASIS) 0.05 % ophthalmic emulsion Place 1 drop into both eyes 2 (two) times daily.   DULoxetine (CYMBALTA) 60 MG capsule TAKE 1 CAPSULE BY MOUTH EVERY DAY   econazole nitrate 1 % cream Apply topically daily.   gabapentin (NEURONTIN) 300 MG capsule 1 IN AM AND 3 AT EVENING   ipratropium (ATROVENT HFA) 17 MCG/ACT inhaler Inhale 2 puffs into the lungs every 4 (four) hours as needed for wheezing.   levothyroxine (SYNTHROID) 25 MCG tablet Take 25 mcg by mouth daily before breakfast.    lithium carbonate 150 MG capsule TAKE 5 CAPSULES (750 MG TOTAL) BY MOUTH AT BEDTIME.   LORazepam (LORAZEPAM INTENSOL) 2 MG/ML concentrated solution Take 0.2 mLs (0.4 mg total) by mouth every 6 (six) hours as needed for anxiety (agitation). (Patient not taking: Reported on 09/17/2020)   Lurasidone HCl (LATUDA) 60 MG TABS Take 1 tablet (60 mg total) by mouth daily.   metoprolol tartrate (LOPRESSOR) 25 MG tablet Take 0.5 tablets (12.5 mg total) by mouth 2 (two) times daily.   modafinil (PROVIGIL) 200 MG tablet 1/2 each AM for 4 days, then 1 each AM   Multiple Vitamins-Minerals  (PRESERVISION AREDS 2) CAPS Take 1 capsule 2 (two) times daily by mouth.   potassium chloride SA (KLOR-CON) 20 MEQ tablet Take 1 tablet by mouth daily with Torsemide   pravastatin (PRAVACHOL) 20 MG tablet Take 20 mg at bedtime by mouth.    SYNTHROID 200 MCG tablet TAKE 1 TABLET BY MOUTH EVERY MORNING ON AN EMPTY STOMACH   Thiamine HCl (VITAMIN B1) 100 MG TABS Take 1 tablet by mouth daily.   torsemide (DEMADEX) 20 MG tablet Take alternating 20 mg or 40 mg every other day   vitamin B-12 (CYANOCOBALAMIN) 100 MCG tablet Take 100 mcg by  mouth 2 (two) times daily.   Vitamin D, Ergocalciferol, (DRISDOL) 1.25 MG (50000 UNIT) CAPS capsule TAKE 1 CAPSULE (50,000 UNITS TOTAL) BY MOUTH EVERY 7 (SEVEN) DAYS.   No facility-administered encounter medications on file as of 10/01/2020.    Thank you for the opportunity to participate in the care of Ms. Voigt.  The palliative care team will continue to follow. Please call our office at [phone] if we can be of additional assistance.   Ezekiel Slocumb, NP   COVID-19 PATIENT SCREENING TOOL Asked and negative response unless otherwise noted:   Have you had symptoms of covid, tested positive or been in contact with someone with symptoms/positive test in the past 5-10 days? No

## 2020-10-01 NOTE — Telephone Encounter (Signed)
Prior approval received for MODAFINIL 200 MG effective 10/01/2020-10/01/2021 with [facility] PA# [id number]

## 2020-10-07 ENCOUNTER — Other Ambulatory Visit: Payer: Self-pay | Admitting: Psychiatry

## 2020-10-07 DIAGNOSIS — F319 Bipolar disorder, unspecified: Secondary | ICD-10-CM

## 2020-10-07 NOTE — Telephone Encounter (Signed)
Could you remind her that I wanted her to get a lithium level as soon as she could.  I saw she had some labs on May 16 but there was no lithium level included.

## 2020-10-07 NOTE — Telephone Encounter (Signed)
Nevermind I found a lithium level it was 0.8.  No need to call her.

## 2020-10-13 ENCOUNTER — Other Ambulatory Visit: Payer: Self-pay

## 2020-10-15 ENCOUNTER — Other Ambulatory Visit: Payer: Self-pay | Admitting: Psychiatry

## 2020-10-15 ENCOUNTER — Other Ambulatory Visit: Payer: Self-pay

## 2020-10-15 DIAGNOSIS — F411 Generalized anxiety disorder: Secondary | ICD-10-CM

## 2020-10-26 ENCOUNTER — Other Ambulatory Visit: Payer: Self-pay | Admitting: Psychiatry

## 2020-10-26 DIAGNOSIS — F3162 Bipolar disorder, current episode mixed, moderate: Secondary | ICD-10-CM

## 2020-11-07 ENCOUNTER — Other Ambulatory Visit: Payer: Self-pay

## 2020-11-07 ENCOUNTER — Telehealth: Payer: Self-pay | Admitting: Psychiatry

## 2020-11-07 DIAGNOSIS — F3162 Bipolar disorder, current episode mixed, moderate: Secondary | ICD-10-CM

## 2020-11-07 MED ORDER — LATUDA 60 MG PO TABS: 1.0000 | ORAL_TABLET | Freq: Every day | ORAL | 1 refills | Status: DC

## 2020-11-07 NOTE — Telephone Encounter (Signed)
Pt called and said that she is not doing well and needs to be seen sooner than august. I told pt that dr. Clovis Pu is booked out but that I would have a nurse call her and try to help her. Please call Jessica [name] at [phone]

## 2020-11-07 NOTE — Telephone Encounter (Signed)
FYI: Rtc to patient and she reports increased depression due to her physical alignments. She is not able to get out of house much at all due to being difficult to [facility] around. Discussed her medication and she had not increased her Latuda to 60 mg. Advised her to get that from her pharmacy and start taking with at least 350 calories. She verbalized understanding. Encouraged pt to not stay in bed all day and sleep, try to get up and do as much as tolerated. She agreed and appreciated the encouragement.

## 2020-11-10 ENCOUNTER — Telehealth: Payer: Self-pay | Admitting: Psychiatry

## 2020-11-10 ENCOUNTER — Other Ambulatory Visit: Payer: Self-pay | Admitting: Psychiatry

## 2020-11-10 DIAGNOSIS — F3162 Bipolar disorder, current episode mixed, moderate: Secondary | ICD-10-CM

## 2020-11-10 MED ORDER — LURASIDONE HCL 80 MG PO TABS: 80.0000 mg | ORAL_TABLET | Freq: Every day | ORAL | 0 refills | Status: DC

## 2020-11-10 NOTE — Telephone Encounter (Signed)
Per Jessica [name], she checked with Husband, and he has been giving her Latuda 60mg  after all. In this case, should she increase to 80mg ? She is taking it in the am sometimes before she had breakfast.

## 2020-11-10 NOTE — Telephone Encounter (Signed)
Follow up from message on 7/8

## 2020-11-10 NOTE — Telephone Encounter (Signed)
Yes increase the dosage of Latuda to 80 mg daily.  She has been on the 60 mg long enough to see all the benefit that that will give her.

## 2020-11-10 NOTE — Telephone Encounter (Signed)
Pt informed

## 2020-11-19 ENCOUNTER — Telehealth: Payer: Self-pay | Admitting: Student

## 2020-11-19 NOTE — Telephone Encounter (Signed)
Palliative NP spoke with patient to offer a tele health visit. She declines tele health visit, stating that would not work. She states that she did not receive message that was left on her home phone. Will reach out to scheduler to have an in person visit scheduled with provider that will now be covering her area.

## 2020-11-24 ENCOUNTER — Telehealth: Payer: Self-pay | Admitting: Psychiatry

## 2020-11-24 DIAGNOSIS — F411 Generalized anxiety disorder: Secondary | ICD-10-CM

## 2020-11-25 ENCOUNTER — Ambulatory Visit: Payer: Medicare Other | Admitting: Cardiovascular Disease

## 2020-11-26 DIAGNOSIS — M26609 Unspecified temporomandibular joint disorder, unspecified side: Secondary | ICD-10-CM | POA: Diagnosis not present

## 2020-11-26 DIAGNOSIS — B351 Tinea unguium: Secondary | ICD-10-CM | POA: Diagnosis not present

## 2020-11-26 DIAGNOSIS — R32 Unspecified urinary incontinence: Secondary | ICD-10-CM | POA: Diagnosis not present

## 2020-11-26 DIAGNOSIS — B372 Candidiasis of skin and nail: Secondary | ICD-10-CM | POA: Diagnosis not present

## 2020-11-28 NOTE — Telephone Encounter (Signed)
Please review sarah's message

## 2020-11-28 NOTE — Telephone Encounter (Signed)
Pt called and wants a refill of the lorazapam tablets 0.5 mg . She takes 2 a day. She wants more than 10 tablets

## 2020-11-30 ENCOUNTER — Other Ambulatory Visit: Payer: Self-pay | Admitting: Physician Assistant

## 2020-11-30 ENCOUNTER — Other Ambulatory Visit: Payer: Self-pay | Admitting: Psychiatry

## 2020-11-30 MED ORDER — LORAZEPAM 0.5 MG PO TABS: 0.5000 mg | ORAL_TABLET | Freq: Three times a day (TID) | ORAL | 0 refills | Status: DC

## 2020-11-30 NOTE — Telephone Encounter (Signed)
Rx sent 

## 2020-12-03 ENCOUNTER — Ambulatory Visit: Payer: Medicare Other | Admitting: Psychiatry

## 2020-12-05 DIAGNOSIS — R32 Unspecified urinary incontinence: Secondary | ICD-10-CM | POA: Diagnosis not present

## 2020-12-05 DIAGNOSIS — I509 Heart failure, unspecified: Secondary | ICD-10-CM | POA: Diagnosis not present

## 2020-12-05 DIAGNOSIS — Z741 Need for assistance with personal care: Secondary | ICD-10-CM | POA: Diagnosis not present

## 2020-12-05 DIAGNOSIS — E039 Hypothyroidism, unspecified: Secondary | ICD-10-CM | POA: Diagnosis not present

## 2020-12-05 DIAGNOSIS — B372 Candidiasis of skin and nail: Secondary | ICD-10-CM | POA: Diagnosis not present

## 2020-12-05 DIAGNOSIS — Z9981 Dependence on supplemental oxygen: Secondary | ICD-10-CM | POA: Diagnosis not present

## 2020-12-05 DIAGNOSIS — I110 Hypertensive heart disease with heart failure: Secondary | ICD-10-CM | POA: Diagnosis not present

## 2020-12-05 DIAGNOSIS — Z9181 History of falling: Secondary | ICD-10-CM | POA: Diagnosis not present

## 2020-12-05 DIAGNOSIS — G4733 Obstructive sleep apnea (adult) (pediatric): Secondary | ICD-10-CM | POA: Diagnosis not present

## 2020-12-05 DIAGNOSIS — M1990 Unspecified osteoarthritis, unspecified site: Secondary | ICD-10-CM | POA: Diagnosis not present

## 2020-12-05 DIAGNOSIS — M26609 Unspecified temporomandibular joint disorder, unspecified side: Secondary | ICD-10-CM | POA: Diagnosis not present

## 2020-12-05 DIAGNOSIS — R262 Difficulty in walking, not elsewhere classified: Secondary | ICD-10-CM | POA: Diagnosis not present

## 2020-12-05 DIAGNOSIS — M545 Low back pain, unspecified: Secondary | ICD-10-CM | POA: Diagnosis not present

## 2020-12-05 DIAGNOSIS — G629 Polyneuropathy, unspecified: Secondary | ICD-10-CM | POA: Diagnosis not present

## 2020-12-05 DIAGNOSIS — J309 Allergic rhinitis, unspecified: Secondary | ICD-10-CM | POA: Diagnosis not present

## 2020-12-05 DIAGNOSIS — J45909 Unspecified asthma, uncomplicated: Secondary | ICD-10-CM | POA: Diagnosis not present

## 2020-12-05 DIAGNOSIS — F319 Bipolar disorder, unspecified: Secondary | ICD-10-CM | POA: Diagnosis not present

## 2020-12-07 ENCOUNTER — Other Ambulatory Visit: Payer: Self-pay | Admitting: Psychiatry

## 2020-12-07 DIAGNOSIS — G4733 Obstructive sleep apnea (adult) (pediatric): Secondary | ICD-10-CM

## 2020-12-08 DIAGNOSIS — B372 Candidiasis of skin and nail: Secondary | ICD-10-CM | POA: Diagnosis not present

## 2020-12-08 DIAGNOSIS — I110 Hypertensive heart disease with heart failure: Secondary | ICD-10-CM | POA: Diagnosis not present

## 2020-12-08 DIAGNOSIS — J45909 Unspecified asthma, uncomplicated: Secondary | ICD-10-CM | POA: Diagnosis not present

## 2020-12-08 DIAGNOSIS — R32 Unspecified urinary incontinence: Secondary | ICD-10-CM | POA: Diagnosis not present

## 2020-12-08 DIAGNOSIS — M26609 Unspecified temporomandibular joint disorder, unspecified side: Secondary | ICD-10-CM | POA: Diagnosis not present

## 2020-12-08 DIAGNOSIS — I509 Heart failure, unspecified: Secondary | ICD-10-CM | POA: Diagnosis not present

## 2020-12-09 DIAGNOSIS — I110 Hypertensive heart disease with heart failure: Secondary | ICD-10-CM | POA: Diagnosis not present

## 2020-12-09 DIAGNOSIS — B372 Candidiasis of skin and nail: Secondary | ICD-10-CM | POA: Diagnosis not present

## 2020-12-09 DIAGNOSIS — J45909 Unspecified asthma, uncomplicated: Secondary | ICD-10-CM | POA: Diagnosis not present

## 2020-12-09 DIAGNOSIS — I509 Heart failure, unspecified: Secondary | ICD-10-CM | POA: Diagnosis not present

## 2020-12-09 DIAGNOSIS — M26609 Unspecified temporomandibular joint disorder, unspecified side: Secondary | ICD-10-CM | POA: Diagnosis not present

## 2020-12-09 DIAGNOSIS — R32 Unspecified urinary incontinence: Secondary | ICD-10-CM | POA: Diagnosis not present

## 2020-12-09 NOTE — Telephone Encounter (Signed)
Modafinil last filled 10/30/20 appt 8/16

## 2020-12-12 DIAGNOSIS — I110 Hypertensive heart disease with heart failure: Secondary | ICD-10-CM | POA: Diagnosis not present

## 2020-12-12 DIAGNOSIS — R32 Unspecified urinary incontinence: Secondary | ICD-10-CM | POA: Diagnosis not present

## 2020-12-12 DIAGNOSIS — B372 Candidiasis of skin and nail: Secondary | ICD-10-CM | POA: Diagnosis not present

## 2020-12-12 DIAGNOSIS — J45909 Unspecified asthma, uncomplicated: Secondary | ICD-10-CM | POA: Diagnosis not present

## 2020-12-12 DIAGNOSIS — I509 Heart failure, unspecified: Secondary | ICD-10-CM | POA: Diagnosis not present

## 2020-12-12 DIAGNOSIS — M26609 Unspecified temporomandibular joint disorder, unspecified side: Secondary | ICD-10-CM | POA: Diagnosis not present

## 2020-12-15 ENCOUNTER — Ambulatory Visit: Payer: Medicare Other | Admitting: Obstetrics and Gynecology

## 2020-12-16 ENCOUNTER — Encounter: Payer: Self-pay | Admitting: Psychiatry

## 2020-12-16 ENCOUNTER — Telehealth (INDEPENDENT_AMBULATORY_CARE_PROVIDER_SITE_OTHER): Payer: Medicare Other | Admitting: Psychiatry

## 2020-12-16 DIAGNOSIS — F319 Bipolar disorder, unspecified: Secondary | ICD-10-CM | POA: Diagnosis not present

## 2020-12-16 DIAGNOSIS — R7989 Other specified abnormal findings of blood chemistry: Secondary | ICD-10-CM | POA: Diagnosis not present

## 2020-12-16 DIAGNOSIS — F900 Attention-deficit hyperactivity disorder, predominantly inattentive type: Secondary | ICD-10-CM

## 2020-12-16 DIAGNOSIS — G4733 Obstructive sleep apnea (adult) (pediatric): Secondary | ICD-10-CM

## 2020-12-16 DIAGNOSIS — Z79899 Other long term (current) drug therapy: Secondary | ICD-10-CM | POA: Diagnosis not present

## 2020-12-16 DIAGNOSIS — F515 Nightmare disorder: Secondary | ICD-10-CM

## 2020-12-16 DIAGNOSIS — F3162 Bipolar disorder, current episode mixed, moderate: Secondary | ICD-10-CM

## 2020-12-16 DIAGNOSIS — F411 Generalized anxiety disorder: Secondary | ICD-10-CM

## 2020-12-16 MED ORDER — LITHIUM CARBONATE 150 MG PO CAPS: 600.0000 mg | ORAL_CAPSULE | Freq: Every day | ORAL | 0 refills | Status: DC
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
## 2020-12-16 NOTE — Progress Notes (Signed)
Cardiology Office Note:   Date:  12/17/2020  NAME:  Jessica [name]    MRN: [id number] DOB:  1950/08/15   PCP:  Alroy Dust, L.Marlou Sa, MD  Cardiologist:  Evalina Field, MD  Electrophysiologist:  None   Referring MD: Aurea Graff.Marlou Sa, MD   Chief Complaint  Patient presents with   Congestive Heart Failure    History of Present Illness:   Jessica [name] is a 70 y.o. female with a hx of diastolic HF/RV failure, OSA/OHS, obesity who presents for follow-up.  She reports she is having several issues with her family.  This is causing significant stress.  She is got 2 grandchildren who are having behavioral issues.  They are working on this as a family.  Her weights are stable at 320 pounds.  No increased edema.  She is alternating 20 mg of torsemide 1 day and 40 mg others.  Seems to be doing the trick.  She is watching her salt.  She unfortunately is not that active.  She reports issues with depression and bipolar disorder.  She denies any chest pain or significant shortness of breath.  She is wearing her oxygen at night.  Wearing her sleep machine.  She overall appears to be stable.  Denies any major symptoms in the office.  She does need refills of her amlodipine.  Recently seen by her psychiatrist yesterday.  They are working on her bipolar disorder.  In cardiovascular standpoint she appears to be stable.  Problem List: 1. HFpEF -60-65%, G1DD 2. RV Failure 2/2 OSA/OHS 3. Morbid Obesity -BMI 58 4. HTN  Past Medical History: Past Medical History:  Diagnosis Date   Arthritis    lower back, hands, knees   Bipolar disorder ([place])    Cellulitis and abscess of left leg 11/29/2018   CHF (congestive heart failure) (HCC)    Depression    Dyspnea    with exertion   GERD (gastroesophageal reflux disease)    patient unsure about this dx - no meds   Headache    History of IBS    watches diet   Hyperlipidemia    Hypertension    Hypothyroidism    Lung edema, acute, with congestive  heart failure ([place])    Obese    Plantar fasciitis    Seasonal allergies    Sleep apnea 2017   uses CPAP    Spinal stenosis of lumbar region    SVD (spontaneous vaginal delivery)    x 2   Systemic lupus ([place])    Thyroid disease     Past Surgical History: Past Surgical History:  Procedure Laterality Date   CARPAL TUNNEL RELEASE Left 09/16/2017   Procedure: LEFT CARPAL TUNNEL RELEASE;  Surgeon: Daryll Brod, MD;  Location: [facility];  Service: Orthopedics;  Laterality: Left;   carpel tunnel surgery Right    CATARACT EXTRACTION Bilateral 2007   w/ lens implants   COLONOSCOPY     DILATION AND CURETTAGE OF UTERUS     EYE SURGERY     FOOT SURGERY Right    hammer toe   FRACTURE SURGERY     R tibia and fibula   LEG SURGERY Right    TONSILLECTOMY     WISDOM TOOTH EXTRACTION      Current Medications: Current Meds  Medication Sig   amLODipine (NORVASC) 5 MG tablet Take 1 tablet (5 mg total) by mouth daily.     Allergies:    Ibuprofen, Codeine, Nabumetone, Other, Promethazine, Vraylar [cariprazine], Amoxicillin-pot clavulanate, Erythromycin  base, and Promethazine-codeine   Social History: Social History   Socioeconomic History   Marital status: Married    Spouse name: Not on file   Number of children: Not on file   Years of education: Not on file   Highest education level: Not on file  Occupational History   Not on file  Tobacco Use   Smoking status: Never   Smokeless tobacco: Never  Vaping Use   Vaping Use: Never used  Substance and Sexual Activity   Alcohol use: Not Currently   Drug use: Never   Sexual activity: Not on file  Other Topics Concern   Not on file  Social History Narrative   She lives in a single level home with her husband.  She has two grown children.   She taught college accounting courses, retired in 2005.    Highest level of education:  Designer, jewellery in [employer]   Social Determinants of Health   Financial Resource Strain: Not on file  Food  Insecurity: Not on file  Transportation Needs: Not on file  Physical Activity: Not on file  Stress: Not on file  Social Connections: Not on file     Family History: The patient's family history includes Bipolar disorder in her father; Breast cancer in her mother; Dementia in her father; Diabetes in her father; Heart disease in her father and mother; Thyroid disease in her mother.  ROS:   All other ROS reviewed and negative. Pertinent positives noted in the HPI.     EKGs/Labs/Other Studies Reviewed:   The following studies were personally reviewed by me today:  Recent Labs: 04/18/2020: ALT 16; B Natriuretic Peptide 37.2 09/15/2020: BUN 16; Creatinine, Ser 0.92; Hemoglobin 12.5; Platelets 381; Potassium 4.7; Sodium 136; TSH 1.200   Recent Lipid Panel    Component Value Date/Time   CHOL 173 01/23/2013 1105   TRIG 271 (H) 01/23/2013 1105   HDL 51 01/23/2013 1105   CHOLHDL 3.4 01/23/2013 1105   VLDL 54 (H) 01/23/2013 1105   LDLCALC 68 01/23/2013 1105    Physical Exam:   VS:  BP 128/84 (BP Location: Left Arm)   Pulse 86   Ht '5\' 2"'$  (1.575 m)   Wt (!) 320 lb (145.2 kg)   SpO2 90%   BMI 58.53 kg/m    Wt Readings from Last 3 Encounters:  12/17/20 (!) 320 lb (145.2 kg)  09/15/20 (!) 323 lb (146.5 kg)  08/20/20 (!) 318 lb (144.2 kg)    General: Well nourished, well developed, in no acute distress Head: Atraumatic, normal size  Eyes: PEERLA, EOMI  Neck: Supple, no JVD Endocrine: No thryomegaly Cardiac: Normal S1, S2; RRR; no murmurs, rubs, or gallops Lungs: Clear to auscultation bilaterally, no wheezing, rhonchi or rales  Abd: Soft, nontender, no hepatomegaly  Ext: Lymphedema noted, trace edema Musculoskeletal: No deformities, BUE and BLE strength normal and equal Skin: Warm and dry, no rashes   Neuro: Alert and oriented to person, place, time, and situation, CNII-XII grossly intact, no focal deficits  Psych: Normal mood and affect   ASSESSMENT:   Jessica [name] is  a 70 y.o. female who presents for the following: 1. Chronic diastolic heart failure ([place])   2. RVF (right ventricular failure) ([place])   3. Fatigue, unspecified type   4. Obesity, morbid, BMI 50 or higher ([place])   5. Primary hypertension     PLAN:   1. Chronic diastolic heart failure ([place]) 2. RVF (right ventricular failure) ([place]) 3. Fatigue, unspecified type -She  appears fairly euvolemic.  She will continue torsemide 20 mg daily and alternate 40 mg as needed.  She is watching her salt intake.  Overall doing well.  Weights are stable but she is not that active.  She will continue to try to exercise as she is able.  Wearing her CPAP machine at night.  Also wearing oxygen at night.  4. Obesity, morbid, BMI 50 or higher ([place]) -Counseled on importance of diet and exercise.  5. Primary hypertension -Amlodipine refilled.  Disposition: Return in about 4 months (around 04/18/2021).  Medication Adjustments/Labs and Tests Ordered: Current medicines are reviewed at length with the patient today.  Concerns regarding medicines are outlined above.  No orders of the defined types were placed in this encounter.  Meds ordered this encounter  Medications   amLODipine (NORVASC) 5 MG tablet    Sig: Take 1 tablet (5 mg total) by mouth daily.    Dispense:  180 tablet    Refill:  3    Patient Instructions  Medication Instructions:  Start Amlodipine 5 mg daily   *If you need a refill on your cardiac medications before your next appointment, please call your pharmacy*  Follow-Up: At [facility], you and your health needs are our priority.  As part of our continuing mission to provide you with exceptional heart care, we have created designated Provider Care Teams.  These Care Teams include your primary Cardiologist (physician) and Advanced Practice Providers (APPs -  Physician Assistants and Nurse Practitioners) who all work together to provide you with the care you need, when you need it.  We  recommend signing up for the patient portal called "MyChart".  Sign up information is provided on this After Visit Summary.  MyChart is used to connect with patients for Virtual Visits (Telemedicine).  Patients are able to view lab/test results, encounter notes, upcoming appointments, etc.  Non-urgent messages can be sent to your provider as well.   To learn more about what you can do with MyChart, go to [email or web address].    Your next appointment:   4 month(s)  The format for your next appointment:   In Person  Provider:   Eleonore Chiquito, MD    Time Spent with Patient: I have spent a total of 35 minutes with patient reviewing hospital notes, telemetry, EKGs, labs and examining the patient as well as establishing an assessment and plan that was discussed with the patient.  > 50% of time was spent in direct patient care.  Signed, Addison Naegeli. Audie Box, MD, [facility]  [address] [place], [place] [address] [phone]  12/17/2020 4:36 PM

## 2020-12-17 ENCOUNTER — Encounter: Payer: Self-pay | Admitting: Cardiovascular Disease

## 2020-12-17 ENCOUNTER — Other Ambulatory Visit: Payer: Self-pay

## 2020-12-17 ENCOUNTER — Ambulatory Visit (INDEPENDENT_AMBULATORY_CARE_PROVIDER_SITE_OTHER): Payer: Medicare Other | Admitting: Cardiovascular Disease

## 2020-12-17 VITALS — BP 128/84 | HR 86 | Ht 62.0 in | Wt 320.0 lb

## 2020-12-17 DIAGNOSIS — I5032 Chronic diastolic (congestive) heart failure: Secondary | ICD-10-CM | POA: Diagnosis not present

## 2020-12-17 DIAGNOSIS — I50810 Right heart failure, unspecified: Secondary | ICD-10-CM

## 2020-12-17 DIAGNOSIS — R5383 Other fatigue: Secondary | ICD-10-CM | POA: Diagnosis not present

## 2020-12-17 DIAGNOSIS — I10 Essential (primary) hypertension: Secondary | ICD-10-CM

## 2020-12-17 MED ORDER — AMLODIPINE BESYLATE 5 MG PO TABS: 5.0000 mg | ORAL_TABLET | Freq: Every day | ORAL | 3 refills | Status: AC

## 2020-12-17 NOTE — Patient Instructions (Signed)
Medication Instructions:  Start Amlodipine 5 mg daily   *If you need a refill on your cardiac medications before your next appointment, please call your pharmacy*  Follow-Up: At [facility], you and your health needs are our priority.  As part of our continuing mission to provide you with exceptional heart care, we have created designated Provider Care Teams.  These Care Teams include your primary Cardiologist (physician) and Advanced Practice Providers (APPs -  Physician Assistants and Nurse Practitioners) who all work together to provide you with the care you need, when you need it.  We recommend signing up for the patient portal called "MyChart".  Sign up information is provided on this After Visit Summary.  MyChart is used to connect with patients for Virtual Visits (Telemedicine).  Patients are able to view lab/test results, encounter notes, upcoming appointments, etc.  Non-urgent messages can be sent to your provider as well.   To learn more about what you can do with MyChart, go to [email or web address].    Your next appointment:   4 month(s)  The format for your next appointment:   In Person  Provider:   Eleonore Chiquito, MD

## 2020-12-19 DIAGNOSIS — J45909 Unspecified asthma, uncomplicated: Secondary | ICD-10-CM | POA: Diagnosis not present

## 2020-12-19 DIAGNOSIS — B372 Candidiasis of skin and nail: Secondary | ICD-10-CM | POA: Diagnosis not present

## 2020-12-19 DIAGNOSIS — M26609 Unspecified temporomandibular joint disorder, unspecified side: Secondary | ICD-10-CM | POA: Diagnosis not present

## 2020-12-19 DIAGNOSIS — I509 Heart failure, unspecified: Secondary | ICD-10-CM | POA: Diagnosis not present

## 2020-12-19 DIAGNOSIS — R32 Unspecified urinary incontinence: Secondary | ICD-10-CM | POA: Diagnosis not present

## 2020-12-19 DIAGNOSIS — I110 Hypertensive heart disease with heart failure: Secondary | ICD-10-CM | POA: Diagnosis not present

## 2020-12-22 ENCOUNTER — Ambulatory Visit: Payer: Medicare Other | Admitting: Pulmonary Disease

## 2020-12-24 DIAGNOSIS — R32 Unspecified urinary incontinence: Secondary | ICD-10-CM | POA: Diagnosis not present

## 2020-12-24 DIAGNOSIS — J45909 Unspecified asthma, uncomplicated: Secondary | ICD-10-CM | POA: Diagnosis not present

## 2020-12-24 DIAGNOSIS — B372 Candidiasis of skin and nail: Secondary | ICD-10-CM | POA: Diagnosis not present

## 2020-12-24 DIAGNOSIS — M26609 Unspecified temporomandibular joint disorder, unspecified side: Secondary | ICD-10-CM | POA: Diagnosis not present

## 2020-12-24 DIAGNOSIS — I509 Heart failure, unspecified: Secondary | ICD-10-CM | POA: Diagnosis not present

## 2020-12-24 DIAGNOSIS — I110 Hypertensive heart disease with heart failure: Secondary | ICD-10-CM | POA: Diagnosis not present

## 2020-12-27 ENCOUNTER — Other Ambulatory Visit: Payer: Self-pay | Admitting: Psychiatry

## 2020-12-29 DIAGNOSIS — I110 Hypertensive heart disease with heart failure: Secondary | ICD-10-CM | POA: Diagnosis not present

## 2020-12-29 DIAGNOSIS — J45909 Unspecified asthma, uncomplicated: Secondary | ICD-10-CM | POA: Diagnosis not present

## 2020-12-29 DIAGNOSIS — R32 Unspecified urinary incontinence: Secondary | ICD-10-CM | POA: Diagnosis not present

## 2020-12-29 DIAGNOSIS — I509 Heart failure, unspecified: Secondary | ICD-10-CM | POA: Diagnosis not present

## 2020-12-29 DIAGNOSIS — M26609 Unspecified temporomandibular joint disorder, unspecified side: Secondary | ICD-10-CM | POA: Diagnosis not present

## 2020-12-29 DIAGNOSIS — B372 Candidiasis of skin and nail: Secondary | ICD-10-CM | POA: Diagnosis not present

## 2020-12-30 ENCOUNTER — Telehealth: Payer: Self-pay | Admitting: Psychiatry

## 2020-12-30 DIAGNOSIS — R32 Unspecified urinary incontinence: Secondary | ICD-10-CM | POA: Diagnosis not present

## 2020-12-30 DIAGNOSIS — B372 Candidiasis of skin and nail: Secondary | ICD-10-CM | POA: Diagnosis not present

## 2020-12-30 DIAGNOSIS — I110 Hypertensive heart disease with heart failure: Secondary | ICD-10-CM | POA: Diagnosis not present

## 2020-12-30 DIAGNOSIS — M26609 Unspecified temporomandibular joint disorder, unspecified side: Secondary | ICD-10-CM | POA: Diagnosis not present

## 2020-12-30 DIAGNOSIS — I509 Heart failure, unspecified: Secondary | ICD-10-CM | POA: Diagnosis not present

## 2020-12-30 DIAGNOSIS — J45909 Unspecified asthma, uncomplicated: Secondary | ICD-10-CM | POA: Diagnosis not present

## 2020-12-30 NOTE — Telephone Encounter (Signed)
She is on a low dose of lorazepam 0.5 mg TID and is under a lot of stress with her health problems.  She can increase the dose to '1mg'$  or 2 of the 0.5 mg tablets every 6-8 hours as needed as long as it does not make her sleeep all the time.  Is she taking the Latuda 80 mg daily also?

## 2020-12-30 NOTE — Telephone Encounter (Signed)
Pt informed and yes she is taking the latuda 80 mg

## 2020-12-30 NOTE — Telephone Encounter (Signed)
Pt called back to remind office when returning Pt's call, please let phone ring several times. Hang up and call right back. Due to taking Pt a few minutes to get to the phone. [phone]

## 2020-12-30 NOTE — Telephone Encounter (Signed)
Pt is not doing well. She is having some suicidal ideation, but no plan, so tired of being sick.  Does have husband at home.She is taking up to 3 lorazepam per day now. She is not sure if any more med changes need to be made.Crying a lot.

## 2020-12-30 NOTE — Telephone Encounter (Signed)
Rtc to pt.She was hesitant to speak to me because I am not a nurse.She said she is taking 3 ativan due to these suicidal thoughts and she is not sure if this is the right thing to do.She stated when the ativan wears off the thoughts come back and these thoughts often come out of the blue.She wants to know if any meds can be adjusted and if she should be taking so many ativan.I also gave her info on [facility] and  ER

## 2020-12-30 NOTE — Telephone Encounter (Signed)
Noted.  Pt is suffering several severe life-threatening medical problems.  She is not considered a high immediate suicide risk.  The extra lorazepam should give her some relief.  She can call back as needed.  Could consider increasing the Latuda to 120 mg daily if needed.  No immediate further action is needed.

## 2020-12-31 DIAGNOSIS — Z515 Encounter for palliative care: Secondary | ICD-10-CM | POA: Diagnosis not present

## 2020-12-31 NOTE — Telephone Encounter (Signed)
Reviewed

## 2021-01-04 ENCOUNTER — Telehealth: Payer: Self-pay | Admitting: Psychiatry

## 2021-01-04 DIAGNOSIS — R262 Difficulty in walking, not elsewhere classified: Secondary | ICD-10-CM | POA: Diagnosis not present

## 2021-01-04 DIAGNOSIS — F319 Bipolar disorder, unspecified: Secondary | ICD-10-CM | POA: Diagnosis not present

## 2021-01-04 DIAGNOSIS — G629 Polyneuropathy, unspecified: Secondary | ICD-10-CM | POA: Diagnosis not present

## 2021-01-04 DIAGNOSIS — M1990 Unspecified osteoarthritis, unspecified site: Secondary | ICD-10-CM | POA: Diagnosis not present

## 2021-01-04 DIAGNOSIS — I110 Hypertensive heart disease with heart failure: Secondary | ICD-10-CM | POA: Diagnosis not present

## 2021-01-04 DIAGNOSIS — R32 Unspecified urinary incontinence: Secondary | ICD-10-CM | POA: Diagnosis not present

## 2021-01-04 DIAGNOSIS — B372 Candidiasis of skin and nail: Secondary | ICD-10-CM | POA: Diagnosis not present

## 2021-01-04 DIAGNOSIS — Z9981 Dependence on supplemental oxygen: Secondary | ICD-10-CM | POA: Diagnosis not present

## 2021-01-04 DIAGNOSIS — Z9181 History of falling: Secondary | ICD-10-CM | POA: Diagnosis not present

## 2021-01-04 DIAGNOSIS — M26609 Unspecified temporomandibular joint disorder, unspecified side: Secondary | ICD-10-CM | POA: Diagnosis not present

## 2021-01-04 DIAGNOSIS — J45909 Unspecified asthma, uncomplicated: Secondary | ICD-10-CM | POA: Diagnosis not present

## 2021-01-04 DIAGNOSIS — I509 Heart failure, unspecified: Secondary | ICD-10-CM | POA: Diagnosis not present

## 2021-01-04 DIAGNOSIS — G4733 Obstructive sleep apnea (adult) (pediatric): Secondary | ICD-10-CM | POA: Diagnosis not present

## 2021-01-04 DIAGNOSIS — J309 Allergic rhinitis, unspecified: Secondary | ICD-10-CM | POA: Diagnosis not present

## 2021-01-04 DIAGNOSIS — M545 Low back pain, unspecified: Secondary | ICD-10-CM | POA: Diagnosis not present

## 2021-01-04 DIAGNOSIS — E039 Hypothyroidism, unspecified: Secondary | ICD-10-CM | POA: Diagnosis not present

## 2021-01-04 DIAGNOSIS — Z741 Need for assistance with personal care: Secondary | ICD-10-CM | POA: Diagnosis not present

## 2021-01-07 ENCOUNTER — Other Ambulatory Visit: Payer: Self-pay

## 2021-01-07 MED ORDER — LORAZEPAM 0.5 MG PO TABS: 0.5000 mg | ORAL_TABLET | Freq: Three times a day (TID) | ORAL | 0 refills | Status: DC

## 2021-01-07 NOTE — Telephone Encounter (Signed)
Pt needs a refill on her ativan

## 2021-01-08 DIAGNOSIS — R32 Unspecified urinary incontinence: Secondary | ICD-10-CM | POA: Diagnosis not present

## 2021-01-08 DIAGNOSIS — J45909 Unspecified asthma, uncomplicated: Secondary | ICD-10-CM | POA: Diagnosis not present

## 2021-01-08 DIAGNOSIS — I110 Hypertensive heart disease with heart failure: Secondary | ICD-10-CM | POA: Diagnosis not present

## 2021-01-08 DIAGNOSIS — M26609 Unspecified temporomandibular joint disorder, unspecified side: Secondary | ICD-10-CM | POA: Diagnosis not present

## 2021-01-08 DIAGNOSIS — B372 Candidiasis of skin and nail: Secondary | ICD-10-CM | POA: Diagnosis not present

## 2021-01-08 DIAGNOSIS — I509 Heart failure, unspecified: Secondary | ICD-10-CM | POA: Diagnosis not present

## 2021-01-09 DIAGNOSIS — I509 Heart failure, unspecified: Secondary | ICD-10-CM | POA: Diagnosis not present

## 2021-01-09 DIAGNOSIS — R32 Unspecified urinary incontinence: Secondary | ICD-10-CM | POA: Diagnosis not present

## 2021-01-09 DIAGNOSIS — J45909 Unspecified asthma, uncomplicated: Secondary | ICD-10-CM | POA: Diagnosis not present

## 2021-01-09 DIAGNOSIS — I110 Hypertensive heart disease with heart failure: Secondary | ICD-10-CM | POA: Diagnosis not present

## 2021-01-09 DIAGNOSIS — B372 Candidiasis of skin and nail: Secondary | ICD-10-CM | POA: Diagnosis not present

## 2021-01-09 DIAGNOSIS — M26609 Unspecified temporomandibular joint disorder, unspecified side: Secondary | ICD-10-CM | POA: Diagnosis not present

## 2021-01-12 DIAGNOSIS — I509 Heart failure, unspecified: Secondary | ICD-10-CM | POA: Diagnosis not present

## 2021-01-12 DIAGNOSIS — I110 Hypertensive heart disease with heart failure: Secondary | ICD-10-CM | POA: Diagnosis not present

## 2021-01-12 DIAGNOSIS — J45909 Unspecified asthma, uncomplicated: Secondary | ICD-10-CM | POA: Diagnosis not present

## 2021-01-12 DIAGNOSIS — R32 Unspecified urinary incontinence: Secondary | ICD-10-CM | POA: Diagnosis not present

## 2021-01-12 DIAGNOSIS — B372 Candidiasis of skin and nail: Secondary | ICD-10-CM | POA: Diagnosis not present

## 2021-01-12 DIAGNOSIS — M26609 Unspecified temporomandibular joint disorder, unspecified side: Secondary | ICD-10-CM | POA: Diagnosis not present

## 2021-01-13 ENCOUNTER — Ambulatory Visit (INDEPENDENT_AMBULATORY_CARE_PROVIDER_SITE_OTHER): Payer: Medicare Other | Admitting: Psychiatry

## 2021-01-13 ENCOUNTER — Encounter: Payer: Self-pay | Admitting: Psychiatry

## 2021-01-13 DIAGNOSIS — G4733 Obstructive sleep apnea (adult) (pediatric): Secondary | ICD-10-CM | POA: Diagnosis not present

## 2021-01-13 DIAGNOSIS — F411 Generalized anxiety disorder: Secondary | ICD-10-CM

## 2021-01-13 DIAGNOSIS — Z79899 Other long term (current) drug therapy: Secondary | ICD-10-CM | POA: Diagnosis not present

## 2021-01-13 DIAGNOSIS — R7989 Other specified abnormal findings of blood chemistry: Secondary | ICD-10-CM | POA: Diagnosis not present

## 2021-01-13 DIAGNOSIS — F900 Attention-deficit hyperactivity disorder, predominantly inattentive type: Secondary | ICD-10-CM

## 2021-01-13 DIAGNOSIS — F3162 Bipolar disorder, current episode mixed, moderate: Secondary | ICD-10-CM | POA: Diagnosis not present

## 2021-01-13 DIAGNOSIS — F515 Nightmare disorder: Secondary | ICD-10-CM

## 2021-01-13 MED ORDER — LORAZEPAM 0.5 MG PO TABS: 0.5000 mg | ORAL_TABLET | ORAL | 1 refills | Status: AC | PRN

## 2021-01-13 NOTE — Progress Notes (Signed)
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 Time Provider [place]  01/14/2021  2:15 PM Margaretha Seeds, MD [facility] None  01/29/2021  2:00 PM Salvadore Dom, MD [facility] None  02/12/2021  5:00 PM Cottle, Billey Co., MD CP-CP None  03/17/2021  3:30 PM Cottle, Billey Co., MD CP-CP None  05/21/2021  4:00 PM O'Neal, Cassie Freer, MD [facility]  05/29/2021  2:00 PM O'Neal, Cassie Freer, MD [facility]    No orders of the defined types were placed in this encounter.     -------------------------------

## 2021-01-14 ENCOUNTER — Encounter: Payer: Self-pay | Admitting: Pulmonary Disease

## 2021-01-14 ENCOUNTER — Ambulatory Visit (INDEPENDENT_AMBULATORY_CARE_PROVIDER_SITE_OTHER): Payer: Medicare Other | Admitting: Pulmonary Disease

## 2021-01-14 ENCOUNTER — Telehealth: Payer: Self-pay | Admitting: Pulmonary Disease

## 2021-01-14 DIAGNOSIS — J9611 Chronic respiratory failure with hypoxia: Secondary | ICD-10-CM

## 2021-01-14 MED ORDER — IPRATROPIUM-ALBUTEROL 0.5-2.5 (3) MG/3ML IN SOLN: 3.0000 mL | Freq: Three times a day (TID) | RESPIRATORY_TRACT | 1 refills | Status: AC | PRN

## 2021-01-14 NOTE — Patient Instructions (Signed)
Chronic hypoxemic respiratory failure, secondary to untreated OSA and suspected PH --CONTINUE supplemental oxygen with exertion for >88%. Counseled on compliance to therapy --ARRANGE nebulizer --ORDER Duonebs THREE times a day as needed for shortness of breath or wheezing --STOP atrovent --Patient being seen by Palliative Care  Left lower lobe pulmonary nodule --ORDER CT Chest without contrast in 3 months  Follow-up with me in 3 months with CT Chest

## 2021-01-14 NOTE — Progress Notes (Signed)
Virtual Visit via Telephone Note  I connected with Jessica [name] on 01/14/21 at  2:15 PM EDT by telephone and verified that I am speaking with the correct person using two identifiers.  Location: Patient: Home Provider: [facility]   I discussed the limitations, risks, security and privacy concerns of performing an evaluation and management service by telephone and the availability of in person appointments. I also discussed with the patient that there may be a patient responsible charge related to this service. The patient expressed understanding and agreed to proceed.   I discussed the assessment and treatment plan with the patient. The patient was provided an opportunity to ask questions and all were answered. The patient agreed with the plan and demonstrated an understanding of the instructions.   The patient was advised to call back or seek an in-person evaluation if the symptoms worsen or if the condition fails to improve as anticipated.  I provided 25 minutes of non-face-to-face time during this encounter.   Dianelly Ferran Rodman Pickle, MD   Subjective:   PATIENT ID: Jessica [name] GENDER: female DOB: 1950-07-15, MRN: [id number]   HPI  Chief Complaint  Patient presents with   Follow-up    Called patient, states she's not doing very well, sob when she moves around a lot and has to stop and take long breaks. States atrovent is not working for her, she continues to wheeze after using it. States she is very weak and her knees are bothering her.   Reason for Visit: Follow-up   Jessica [name] is a 70 year old female never smoker with chronic hypoxemic respiratory failure, OSA on CPAP, spinal stenosis and lupus who presents for follow-up.  Since our last visit, she has been compliant with her atrovent, CPAP and oxygen. She was recently was diagnosed with TMJ so it is difficult to speak. She reports her daytime oxygen will stay above 92% though does not exert  herself. She is wearing her CPAP nightly and during the day when laying down. She reports she is wheezing daily despite atrovent use.  Social History: Never smoker  Past Medical History:  Diagnosis Date   Arthritis    lower back, hands, knees   Bipolar disorder ([place])    Cellulitis and abscess of left leg 11/29/2018   CHF (congestive heart failure) (HCC)    Depression    Dyspnea    with exertion   GERD (gastroesophageal reflux disease)    patient unsure about this dx - no meds   Headache    History of IBS    watches diet   Hyperlipidemia    Hypertension    Hypothyroidism    Lung edema, acute, with congestive heart failure ([place])    Obese    Plantar fasciitis    Seasonal allergies    Sleep apnea 2017   uses CPAP    Spinal stenosis of lumbar region    SVD (spontaneous vaginal delivery)    x 2   Systemic lupus ([place])    Thyroid disease      Outpatient Medications Prior to Visit  Medication Sig Dispense Refill   acetaminophen (TYLENOL) 650 MG CR tablet Take 650 mg by mouth daily.     Acetylcysteine 600 MG CAPS Take 600 mg by mouth 2 (two) times daily. NAC     amLODipine (NORVASC) 5 MG tablet Take 1 tablet (5 mg total) by mouth daily. 180 tablet 3   Carboxymeth-Glycerin-Polysorb (REFRESH OPTIVE ADVANCED OP) Apply to eye.  cycloSPORINE (RESTASIS) 0.05 % ophthalmic emulsion Place 1 drop into both eyes 2 (two) times daily.     DULoxetine (CYMBALTA) 60 MG capsule TAKE 1 CAPSULE BY MOUTH EVERY DAY 90 capsule 0   econazole nitrate 1 % cream Apply topically daily.     gabapentin (NEURONTIN) 300 MG capsule TAKE 1 CAPSULE BY MOUTH IN MORNING AND 3 CAPSULES AT THE EVENING 360 capsule 1   ipratropium (ATROVENT HFA) 17 MCG/ACT inhaler Inhale 2 puffs into the lungs every 4 (four) hours as needed for wheezing. 1 each 6   LATUDA 80 MG TABS tablet TAKE 1 TABLET (80 MG TOTAL) BY MOUTH DAILY WITH BREAKFAST. 30 tablet 1   levothyroxine (SYNTHROID) 25 MCG tablet Take 25 mcg by mouth daily  before breakfast.      lithium carbonate 150 MG capsule Take 4 capsules (600 mg total) by mouth at bedtime. 360 capsule 0   LORazepam (ATIVAN) 0.5 MG tablet Take 1 tablet (0.5 mg total) by mouth every 4 (four) hours as needed for anxiety. 150 tablet 1   metoprolol tartrate (LOPRESSOR) 25 MG tablet Take 0.5 tablets (12.5 mg total) by mouth 2 (two) times daily. 60 tablet 0   Multiple Vitamins-Minerals (PRESERVISION AREDS 2) CAPS Take 1 capsule 2 (two) times daily by mouth.     MYRBETRIQ 50 MG TB24 tablet Take 50 mg by mouth daily.     potassium chloride SA (KLOR-CON) 20 MEQ tablet Take 1 tablet by mouth daily with Torsemide 90 tablet 1   pravastatin (PRAVACHOL) 20 MG tablet Take 20 mg at bedtime by mouth.      SYNTHROID 200 MCG tablet TAKE 1 TABLET BY MOUTH EVERY MORNING ON AN EMPTY STOMACH     Thiamine HCl (VITAMIN B1) 100 MG TABS Take 1 tablet by mouth daily.     torsemide (DEMADEX) 20 MG tablet Take alternating 20 mg or 40 mg every other day 270 tablet 1   vitamin B-12 (CYANOCOBALAMIN) 100 MCG tablet Take 100 mcg by mouth 2 (two) times daily.     Vitamin D, Ergocalciferol, (DRISDOL) 1.25 MG (50000 UNIT) CAPS capsule TAKE 1 CAPSULE (50,000 UNITS TOTAL) BY MOUTH EVERY 7 (SEVEN) DAYS. 12 capsule 2   No facility-administered medications prior to visit.    Review of Systems  Constitutional:  Negative for chills, diaphoresis, fever, malaise/fatigue and weight loss.  HENT:  Negative for congestion.   Respiratory:  Positive for shortness of breath and wheezing. Negative for cough, hemoptysis and sputum production.   Cardiovascular:  Negative for chest pain, palpitations and leg swelling.   Objective:   There were no vitals filed for this visit.  There is no height or weight on file to calculate BMI.   SpO2 92%  Physical Exam: General: No acute distress, muffled voice Respiratory: No audible wheezing. Speaking full sentences  Data Reviewed:  Imaging: CXR 11/16/18 - No infiltrate, edema  or effusion. Enlarged cardiac silhouette CXR 12/09/18 - Cardiomegaly, bibasilar atelectasis CTA 02/29/20 - No pulmonary embolism. LLL lung nodule 6x29m.   PFT: 01/29/19 FVC 0.61 (21%) FEV1 0.61 (28%) Ratio 100 Interpretation: No obstructive defect. Reduced FVC and FEV1 suggestive of restrictive defect  Sleep Study: 02/2016 - [facility] 133  Ambulatory O2 11/22/18 Patient Saturations on Room Air at Rest = 88% Patient Saturations on Room Air while Ambulating = 88% Patient Saturations on 4 Liters of oxygen while Ambulating = 91%   Recommend 2L of O2 at rest, 4L of O2 with exertion  Ambulatory O2 04/09/19  Patient  Saturations on Room Air at Rest = 92% Patient Saturations on [employer] while Ambulating = 87% Patient Saturations on 2Liters of oxygen while Ambulating = 95%   Recommend 0L of O2 via nasal cannula at rest  Recommend 2L of O2 via nasal cannula with exertion and sleep  TTE  12/02/18 - EF 60-65%. Diastolic heart failure, mildly decreased RV systolic function. Aortic and mitral annular calcification.    Assessment & Plan:   Discussion: 70 year old female never smoker with morbid obesity, OSA on CPAP, chronic hypoxemic respiratory failure, chronic diastolic heart failure who presents for follow-up. Has difficulty using her inhaler due to TMJ. Will change to nebulizer. Previously noted for LLL nodule. Will re-evaluate with CT for any changes.  Chronic hypoxemic respiratory failure, secondary to untreated OSA and suspected PH --CONTINUE supplemental oxygen with exertion for >88%. Counseled on compliance to therapy --ARRANGE nebulizer --ORDER Duonebs THREE times a day as needed for shortness of breath or wheezing --STOP atrovent --Patient being seen by Palliative Care  Left lower lobe pulmonary nodule --ORDER CT Chest without contrast in 3 months  OSA - uncontrolled Patient uses NIV for more than four hours nightly for more then 4 hours per night on 70% of nights during the last three  months of usage. --Counseled on sleep hygiene --Counseled on weight loss/maintenance of healthy weight --Counseled NOT to drive if/when sleepy --Advised patient to wear CPAP for at least 4 hours each night for greater than 70% of the time to avoid the machine being repossessed by insurance.  Allergic Rhinitis to pollen - uncontrolled --Continue claritin daily --Continue flonase --Recommend saline rinses as needed  Morbid Obesity  Orders Placed This Encounter  Procedures   CT Chest Wo Contrast    Standing Status:   Future    Standing Expiration Date:   01/14/2022    Scheduling Instructions:     CT Chest without contrast in 3 months, office visit w/JE after CT    Order Specific Question:   Preferred imaging location?    Answer:   [id number]   Ambulatory Referral for DME    Referral Priority:   Routine    Referral Type:   Durable Medical Equipment Purchase    Number of Visits Requested:   1   Meds ordered this encounter  Medications   ipratropium-albuterol (DUONEB) 0.5-2.5 (3) MG/3ML SOLN    Sig: Take 3 mLs by nebulization 3 (three) times daily as needed.    Dispense:  270 mL    Refill:  1    J96.11   Return in about 3 months (around 04/15/2021).   [place], MD [facility] 01/14/2021 2:26 PM  Office Number [phone]

## 2021-01-14 NOTE — Telephone Encounter (Signed)
Called and spoke with pt and she stated that she is not sure that she would be able to do the video visit.  She has not logged on her mychart since 04/2020.  She stated that is would be easier to do the telephone call for the visit.

## 2021-01-15 ENCOUNTER — Other Ambulatory Visit: Payer: Self-pay | Admitting: Psychiatry

## 2021-01-15 ENCOUNTER — Telehealth: Payer: Self-pay | Admitting: Psychiatry

## 2021-01-15 NOTE — Telephone Encounter (Signed)
Based on last conversation she can take the lorazepam 0.5 mg tablet (lowest dose available) every 4-6 hours to manage her anxiety.  She was earlier saying every 6-8 hours was not frequent enough to manage anxiety which triggered SI.

## 2021-01-15 NOTE — Telephone Encounter (Signed)
Jessica [name] called and said that the increase of the lorazapam is making sleep all day. Can she take a lower dose so she can stay awake. Please give her a call at [phone]

## 2021-01-15 NOTE — Telephone Encounter (Signed)
She was just seen 9/13

## 2021-01-19 ENCOUNTER — Telehealth: Payer: Self-pay | Admitting: Psychiatry

## 2021-01-19 NOTE — Telephone Encounter (Signed)
Jessica [name] called to say that the increase in the Lorazepam has not been good. She is very tired and has not left the couch for 5 days. Her pharmacist also told her that taking that much can cause addiction. She decreased it to 1 tablet per night for the last three nights. If she doesn't take at least 1 tablet at bedtime she has nightmares and SI. Still sleepy on just the 1 tablet.

## 2021-01-19 NOTE — Telephone Encounter (Signed)
Left pt message to call back earlier and discuss her decrease back down but sounds like that isn't working either based on this morning phone call.  What are you wanting her to take?

## 2021-01-19 NOTE — Telephone Encounter (Signed)
Tell her to continue to take the lorazepam at night to prevent the nightmares and suicidal thoughts.  I do not believe it is causing sleepiness all day I believe that is coming from some other cause or else from her medical illnesses.

## 2021-01-19 NOTE — Telephone Encounter (Signed)
Please call more than once as she does not hear the phone sometimes and has to get up to answer it.

## 2021-01-19 NOTE — Telephone Encounter (Signed)
Please review

## 2021-01-19 NOTE — Telephone Encounter (Signed)
See other phone message  

## 2021-01-19 NOTE — Telephone Encounter (Signed)
Pt informed

## 2021-01-20 NOTE — Telephone Encounter (Signed)
Pt informed

## 2021-01-20 NOTE — Telephone Encounter (Signed)
I do not have another solution for her anxiety and insomnia other than lorazepam.  However if it is making her too sleepy she can reduce the dose by cutting the tablet in half with the pill splitter or getting her husband to do that for her.  So the dosing range she can use is anywhere between 0.25 mg only at night up to 0.25 or 0.5 mg every 6 hours as needed for anxiety.  Some of her sleepiness is coming from her other medical problems.  The sleepiness is not all the result of lorazepam.

## 2021-01-23 DIAGNOSIS — I110 Hypertensive heart disease with heart failure: Secondary | ICD-10-CM | POA: Diagnosis not present

## 2021-01-23 DIAGNOSIS — J45909 Unspecified asthma, uncomplicated: Secondary | ICD-10-CM | POA: Diagnosis not present

## 2021-01-23 DIAGNOSIS — B372 Candidiasis of skin and nail: Secondary | ICD-10-CM | POA: Diagnosis not present

## 2021-01-23 DIAGNOSIS — I509 Heart failure, unspecified: Secondary | ICD-10-CM | POA: Diagnosis not present

## 2021-01-23 DIAGNOSIS — M26609 Unspecified temporomandibular joint disorder, unspecified side: Secondary | ICD-10-CM | POA: Diagnosis not present

## 2021-01-23 DIAGNOSIS — R32 Unspecified urinary incontinence: Secondary | ICD-10-CM | POA: Diagnosis not present

## 2021-01-27 ENCOUNTER — Other Ambulatory Visit: Payer: Self-pay | Admitting: Psychiatry

## 2021-01-27 DIAGNOSIS — F319 Bipolar disorder, unspecified: Secondary | ICD-10-CM

## 2021-01-27 DIAGNOSIS — R404 Transient alteration of awareness: Secondary | ICD-10-CM | POA: Diagnosis not present

## 2021-01-29 ENCOUNTER — Ambulatory Visit: Payer: Medicare Other | Admitting: Obstetrics and Gynecology

## 2021-01-30 ENCOUNTER — Telehealth: Payer: Self-pay | Admitting: Psychiatry

## 2021-01-30 NOTE — Telephone Encounter (Signed)
[name] called to say that Jessica [name] passed away on 02/21/2021. She passed in her sleep and is at peace, with the [place]. He is okay and at peace with this as she was very ill.

## 2021-01-31 DIAGNOSIS — 419620001 Death: Secondary | SNOMED CT | POA: Diagnosis not present

## 2021-01-31 DEATH — deceased

## 2021-02-02 NOTE — Telephone Encounter (Signed)
FYI

## 2021-02-02 NOTE — Telephone Encounter (Signed)
Sorry to hear 

## 2021-02-12 ENCOUNTER — Ambulatory Visit: Payer: Medicare Other | Admitting: Psychiatry

## 2021-03-17 ENCOUNTER — Ambulatory Visit: Payer: Medicare Other | Admitting: Psychiatry

## 2021-05-21 ENCOUNTER — Ambulatory Visit: Payer: Medicare Other | Admitting: Cardiovascular Disease

## 2021-05-29 ENCOUNTER — Ambulatory Visit: Payer: Medicare Other | Admitting: Cardiovascular Disease

## 2022-02-09 IMAGING — MR MR CERVICAL SPINE W/O CM
4 of 5 series · 29 of 48 positions shown · non-contrast
Comparison: Plain films April 20, 2019

CLINICAL DATA: Spondylolisthesis of the cervical region.

EXAM:
MRI CERVICAL SPINE WITHOUT CONTRAST
TECHNIQUE: Multiplanar, multisequence MR imaging of the cervical spine was
performed. No intravenous contrast was administered.

[Series 5: T2 · sagittal · 3.0mm · 0.66mm/px · 7 of 14 slices shown (1 of 2)]
[im 1/14]
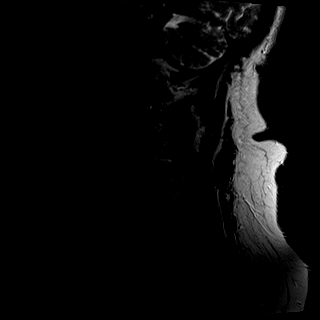
[im 3/14]
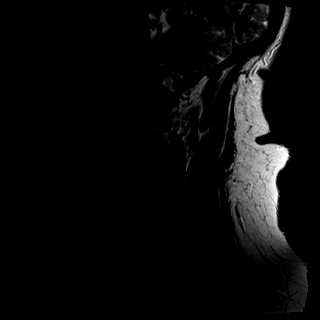
[im 5/14]
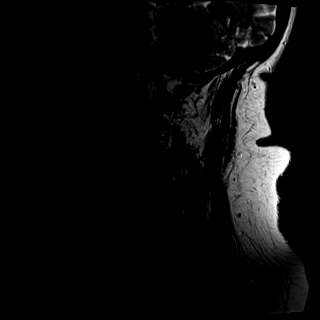
[im 7/14]
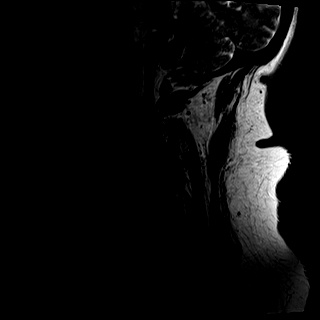
[im 9/14]
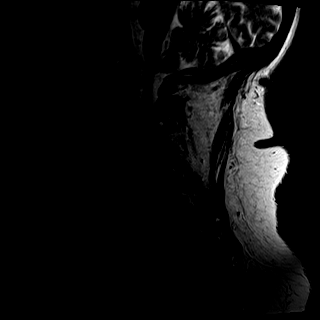
[im 11/14]
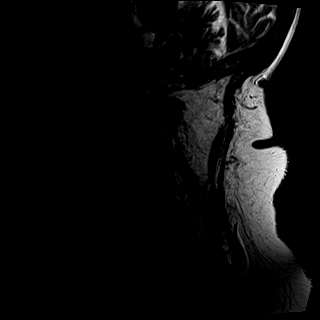
[im 14/14]
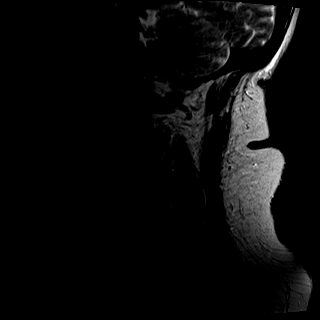

[Series 6: T1 · sagittal · 3.0mm · 0.41mm/px · 7 of 14 slices shown]
[im 1/14]
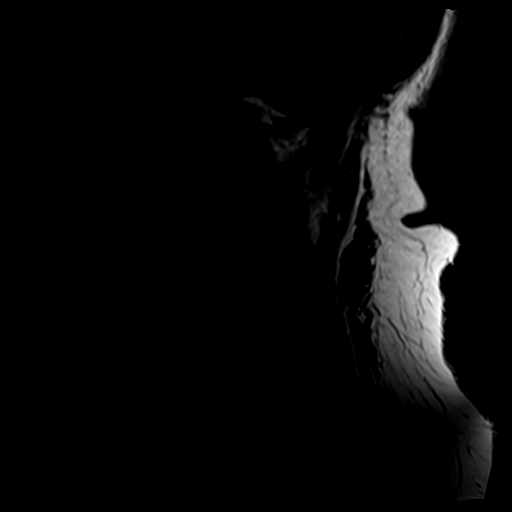
[im 3/14]
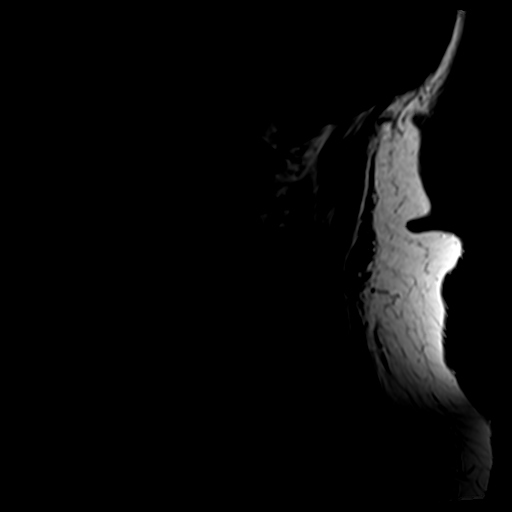
[im 5/14]
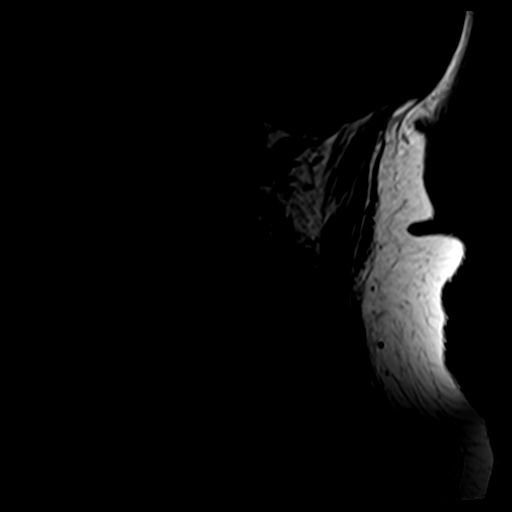
[im 7/14]
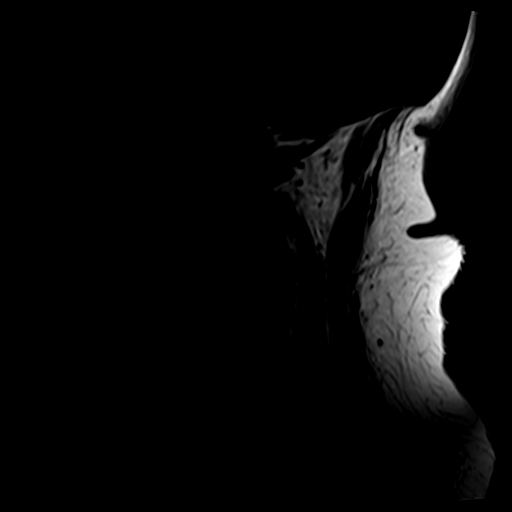
[im 9/14]
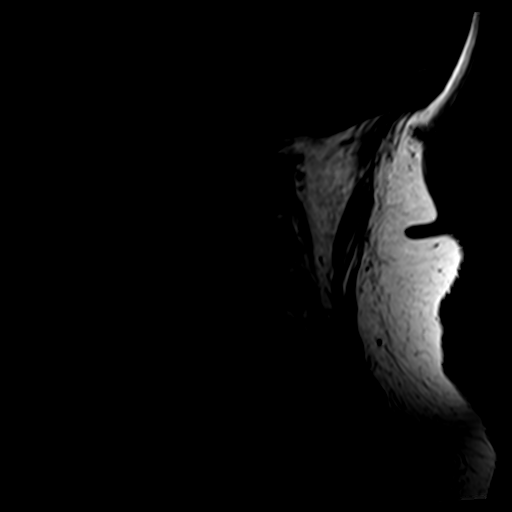
[im 11/14]
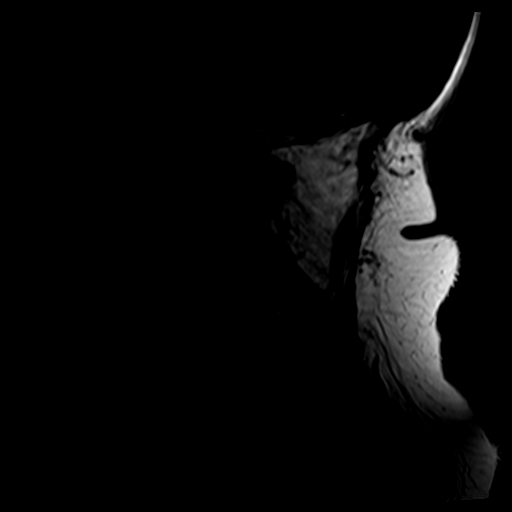
[im 14/14]
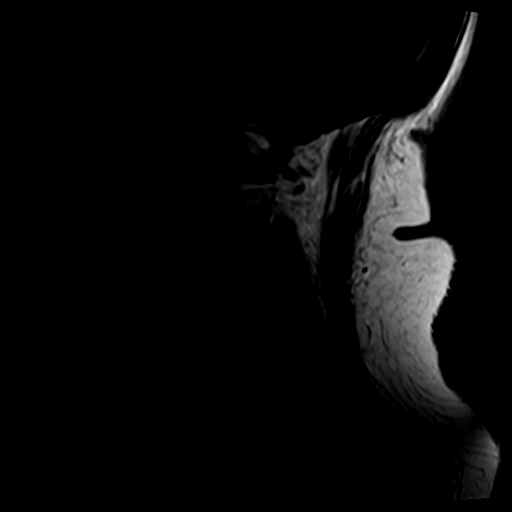

[Series 7: tir sag · sagittal · 3.0mm · 0.41mm/px · 6 of 14 slices shown]
[im 1/14]
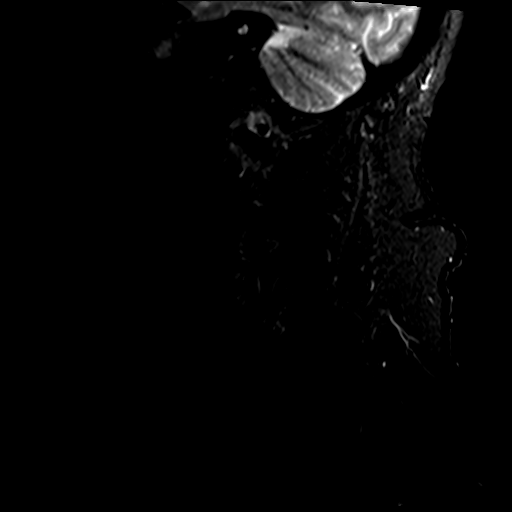
[im 2/14]
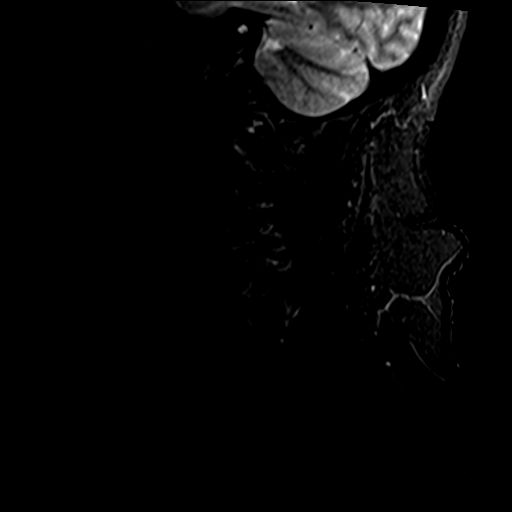
[im 4/14]
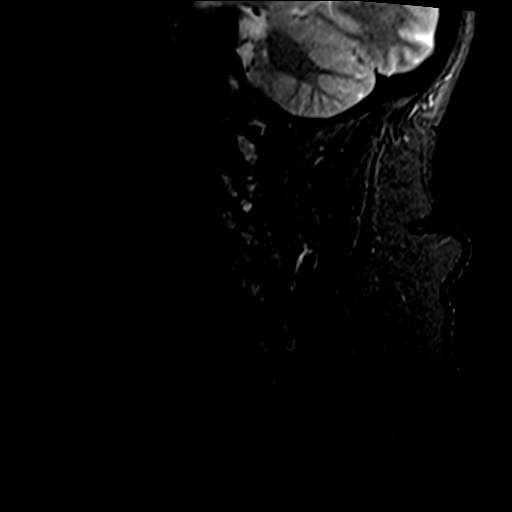
[im 6/14]
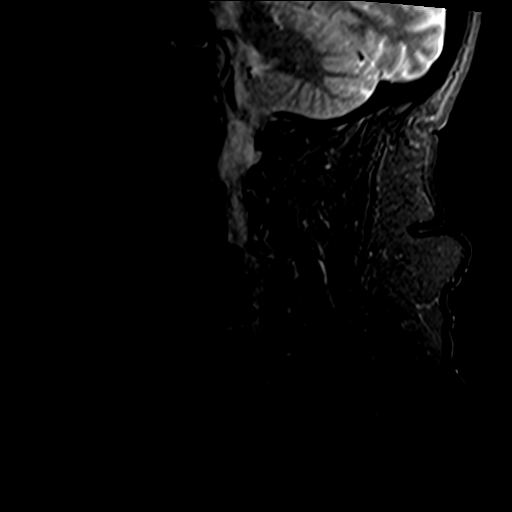
[im 8/14]
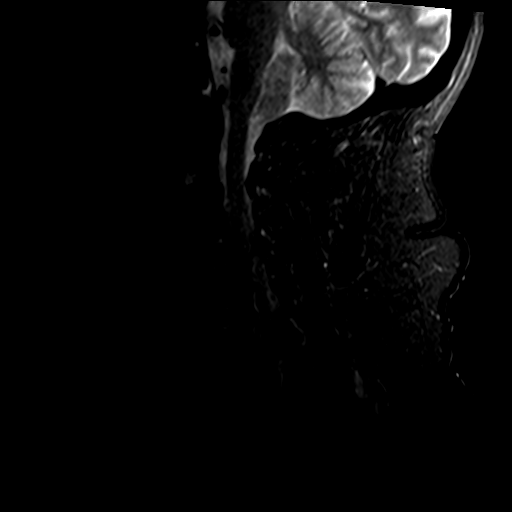
[im 12/14]
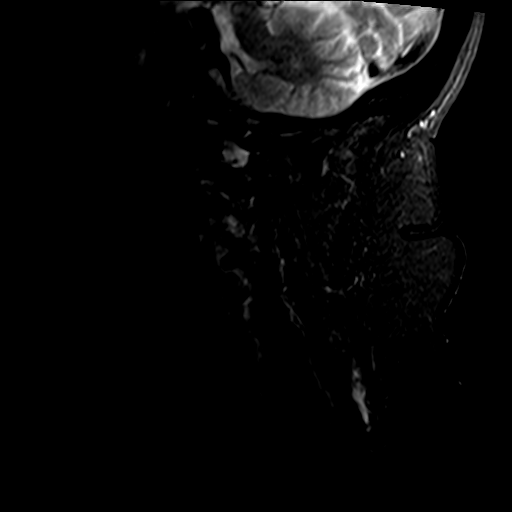

[Series 9: T2 · axial · 3.0mm · 0.70mm/px · z∈[+23,+103]mm · 9 of 23 slices shown (2 of 2)]
[im 1/23]
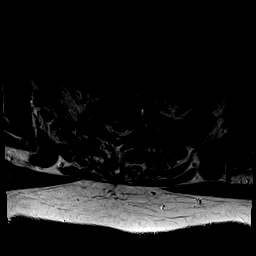
[im 4/23]
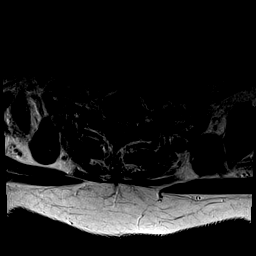
[im 8/23]
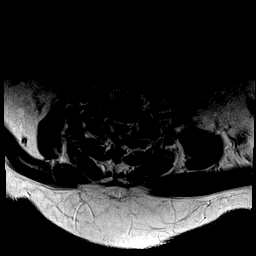
[im 10/23]
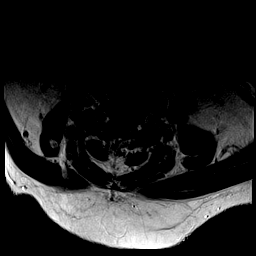
[im 12/23]
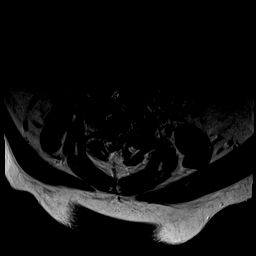
[im 13/23]
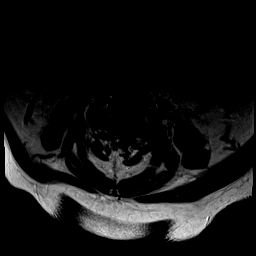
[im 15/23]
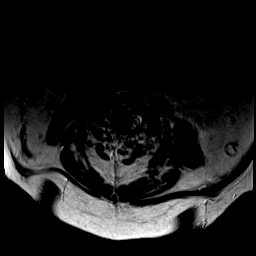
[im 19/23]
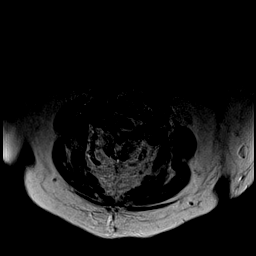
[im 23/23]
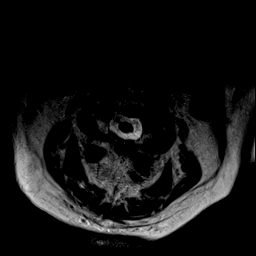

[29 of 48 positions shown; findings below may reference images not displayed]

FINDINGS: Study significantly degraded by motion.

Alignment: Reversal of the cervical curvature. There is a 3 mm
anterolisthesis of C4 over C5 with minimal anterolisthesis of C5
over C6, C7 over T1 and minimal retrolisthesis of C6 over C7.

Vertebrae: No fracture, evidence of discitis, or bone lesion.
Endplate degenerative changes are seen throughout the cervical
spine, more pronounced from C4 through C7.

Cord: Evaluation of the cord is significantly limited by motion. No
gross cord signal abnormality or significant cord compression seen.

Posterior Fossa, vertebral arteries, paraspinal tissues: Negative.

Disc levels:

C2-3: No spinal canal or neural foraminal stenosis.

C3-4: No spinal canal or neural foraminal stenosis.

C4-5: Posterior disc protrusion with mild spinal canal stenosis.
Uncovertebral and facet degenerative changes resulting in mild right
and moderate left neural foraminal narrowing.

C5-C6: Posterior disc protrusion resulting in mild spinal canal
stenosis. No significant neural foraminal narrowing.

C5-6: Posterior disc protrusion resulting in mild spinal canal
stenosis. Uncovertebral and facet degenerative changes resulting in
mild bilateral neural foraminal narrowing.

C7-T1: Posterior disc protrusion and mild facet degenerative changes
resulting in mild spinal canal stenosis and mild left neural
foraminal narrowing.
IMPRESSION: 1. Significantly motion degraded study.
2. Multilevel degenerative changes of the cervical spine with mild
spinal canal stenosis from C4 through T1.
3. Moderate left neural foraminal narrowing at C4-5.
